# Patient Record
Sex: Female | Born: 1940
Health system: Southern US, Community
[De-identification: ages and names within clinical notes are randomized; demographics above are authoritative.]

## PROBLEM LIST (undated history)

## (undated) ENCOUNTER — Emergency Department (HOSPITAL_COMMUNITY): Admission: EM | Payer: Medicare Other | Source: Home / Self Care

## (undated) DIAGNOSIS — F32A Depression, unspecified: Secondary | ICD-10-CM

## (undated) DIAGNOSIS — E119 Type 2 diabetes mellitus without complications: Secondary | ICD-10-CM

## (undated) DIAGNOSIS — F509 Eating disorder, unspecified: Secondary | ICD-10-CM

## (undated) DIAGNOSIS — I10 Essential (primary) hypertension: Secondary | ICD-10-CM

## (undated) DIAGNOSIS — G43909 Migraine, unspecified, not intractable, without status migrainosus: Secondary | ICD-10-CM

## (undated) DIAGNOSIS — F329 Major depressive disorder, single episode, unspecified: Secondary | ICD-10-CM

## (undated) DIAGNOSIS — M1990 Unspecified osteoarthritis, unspecified site: Secondary | ICD-10-CM

## (undated) DIAGNOSIS — E785 Hyperlipidemia, unspecified: Secondary | ICD-10-CM

## (undated) HISTORY — PX: BREAST BIOPSY: SHX20

## (undated) HISTORY — PX: BREAST LUMPECTOMY: SHX2

## (undated) HISTORY — DX: Essential (primary) hypertension: I10

## (undated) HISTORY — DX: Migraine, unspecified, not intractable, without status migrainosus: G43.909

## (undated) HISTORY — DX: Eating disorder, unspecified: F50.9

## (undated) HISTORY — DX: Hyperlipidemia, unspecified: E78.5

## (undated) HISTORY — DX: Unspecified osteoarthritis, unspecified site: M19.90

## (undated) HISTORY — DX: Type 2 diabetes mellitus without complications: E11.9

## (undated) HISTORY — DX: Depression, unspecified: F32.A

---
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
## 2008-07-15 ENCOUNTER — Emergency Department (HOSPITAL_COMMUNITY): Admission: EM | Admit: 2008-07-15 | Discharge: 2008-07-15 | Payer: Self-pay | Admitting: Emergency Medicine

## 2008-09-13 ENCOUNTER — Emergency Department (HOSPITAL_COMMUNITY): Admission: EM | Admit: 2008-09-13 | Discharge: 2008-09-13 | Payer: Self-pay | Admitting: Emergency Medicine

## 2008-09-14 ENCOUNTER — Emergency Department (HOSPITAL_COMMUNITY): Admission: EM | Admit: 2008-09-14 | Discharge: 2008-09-14 | Payer: Self-pay | Admitting: Emergency Medicine

## 2010-08-02 LAB — GLUCOSE, CAPILLARY: Glucose-Capillary: 111 mg/dL — ABNORMAL HIGH (ref 70–99)

## 2018-06-15 LAB — HM MAMMOGRAPHY

## 2019-01-03 ENCOUNTER — Ambulatory Visit: Payer: Self-pay | Admitting: Internal Medicine

## 2019-02-27 ENCOUNTER — Encounter: Payer: Self-pay | Admitting: Family Medicine

## 2019-02-27 ENCOUNTER — Ambulatory Visit (INDEPENDENT_AMBULATORY_CARE_PROVIDER_SITE_OTHER): Payer: Medicare Other | Admitting: Family Medicine

## 2019-02-27 ENCOUNTER — Other Ambulatory Visit: Payer: Self-pay

## 2019-02-27 VITALS — BP 180/86 | HR 98 | Temp 96.4°F | Ht 62.0 in | Wt 110.0 lb

## 2019-02-27 DIAGNOSIS — F0391 Unspecified dementia with behavioral disturbance: Secondary | ICD-10-CM

## 2019-02-27 DIAGNOSIS — M1990 Unspecified osteoarthritis, unspecified site: Secondary | ICD-10-CM | POA: Diagnosis not present

## 2019-02-27 DIAGNOSIS — I10 Essential (primary) hypertension: Secondary | ICD-10-CM

## 2019-02-27 DIAGNOSIS — Z853 Personal history of malignant neoplasm of breast: Secondary | ICD-10-CM

## 2019-02-27 DIAGNOSIS — Z7689 Persons encountering health services in other specified circumstances: Secondary | ICD-10-CM | POA: Diagnosis not present

## 2019-02-27 DIAGNOSIS — B20 Human immunodeficiency virus [HIV] disease: Secondary | ICD-10-CM

## 2019-02-27 NOTE — Patient Instructions (Addendum)
You should get a blood pressure cuff to monitor your blood pressure at home. We increased your Metoprolol to 50 mg daily.  Please keep a record of your blood pressures.  We will have you follow up in clinic in 2-3 wks.   Arthritis Arthritis is a term that is commonly used to refer to joint pain or joint disease. There are more than 100 types of arthritis. What are the causes? The most common cause of this condition is wear and tear of a joint. Other causes include:  Gout.  Inflammation of a joint.  An infection of a joint.  Sprains and other injuries near the joint.  A reaction to medicines or drugs, or an allergic reaction. In some cases, the cause may not be known. What are the signs or symptoms? The main symptom of this condition is pain in the joint during movement. Other symptoms include:  Redness, swelling, or stiffness at a joint.  Warmth coming from the joint.  Fever.  Overall feeling of illness. How is this diagnosed? This condition may be diagnosed with a physical exam and tests, including:  Blood tests.  Urine tests.  Imaging tests, such as X-rays, an MRI, or a CT scan. Sometimes, fluid is removed from a joint for testing. How is this treated? This condition may be treated with:  Treatment of the cause, if it is known.  Rest.  Raising (elevating) the joint.  Applying cold or hot packs to the joint.  Medicines to improve symptoms and reduce inflammation.  Injections of a steroid such as cortisone into the joint to help reduce pain and inflammation. Depending on the cause of your arthritis, you may need to make lifestyle changes to reduce stress on your joint. Changes may include:  Exercising more.  Losing weight. Follow these instructions at home: Medicines  Take over-the-counter and prescription medicines only as told by your health care provider.  Do not take aspirin to relieve pain if your health care provider thinks that gout may be causing  your pain. Activity  Rest your joint if told by your health care provider. Rest is important when your disease is active and your joint feels painful, swollen, or stiff.  Avoid activities that make the pain worse. It is important to balance activity with rest.  Exercise your joint regularly with range-of-motion exercises as told by your health care provider. Try doing low-impact exercise, such as: ? Swimming. ? Water aerobics. ? Biking. ? Walking. Managing pain, stiffness, and swelling      If directed, put ice on the joint. ? Put ice in a plastic bag. ? Place a towel between your skin and the bag. ? Leave the ice on for 20 minutes, 2-3 times per day.  If your joint is swollen, raise (elevate) it above the level of your heart if directed by your health care provider.  If your joint feels stiff in the morning, try taking a warm shower.  If directed, apply heat to the affected area as often as told by your health care provider. Use the heat source that your health care provider recommends, such as a moist heat pack or a heating pad. If you have diabetes, do not apply heat without permission from your health care provider. To apply heat: ? Place a towel between your skin and the heat source. ? Leave the heat on for 20-30 minutes. ? Remove the heat if your skin turns bright red. This is especially important if you are unable to feel  pain, heat, or cold. You may have a greater risk of getting burned. General instructions  Do not use any products that contain nicotine or tobacco, such as cigarettes, e-cigarettes, and chewing tobacco. If you need help quitting, ask your health care provider.  Keep all follow-up visits as told by your health care provider. This is important. Contact a health care provider if:  The pain gets worse.  You have a fever. Get help right away if:  You develop severe joint pain, swelling, or redness.  Many joints become painful and swollen.  You develop  severe back pain.  You develop severe weakness in your leg.  You cannot control your bladder or bowels. Summary  Arthritis is a term that is commonly used to refer to joint pain or joint disease. There are more than 100 types of arthritis.  The most common cause of this condition is wear and tear of a joint. Other causes include gout, inflammation or infection of the joint, sprains, or allergies.  Symptoms of this condition include redness, swelling, or stiffness of the joint. Other symptoms include warmth, fever, or feeling ill.  This condition is treated with rest, elevation, medicines, and applying cold or hot packs.  Follow your health care provider's instructions about medicines, activity, exercises, and other home care treatments. This information is not intended to replace advice given to you by your health care provider. Make sure you discuss any questions you have with your health care provider. Document Released: 05/18/2004 Document Revised: 03/18/2018 Document Reviewed: 03/18/2018 Elsevier Patient Education  2020 [employer].  Managing Your Hypertension Hypertension is commonly called high blood pressure. This is when the force of your blood pressing against the walls of your arteries is too strong. Arteries are blood vessels that carry blood from your heart throughout your body. Hypertension forces the heart to work harder to pump blood, and may cause the arteries to become narrow or stiff. Having untreated or uncontrolled hypertension can cause heart attack, stroke, kidney disease, and other problems. What are blood pressure readings? A blood pressure reading consists of a higher number over a lower number. Ideally, your blood pressure should be below 120/80. The first ("top") number is called the systolic pressure. It is a measure of the pressure in your arteries as your heart beats. The second ("bottom") number is called the diastolic pressure. It is a measure of the pressure  in your arteries as the heart relaxes. What does my blood pressure reading mean? Blood pressure is classified into four stages. Based on your blood pressure reading, your health care provider may use the following stages to determine what type of treatment you need, if any. Systolic pressure and diastolic pressure are measured in a unit called mm Hg. Normal  Systolic pressure: below 469.  Diastolic pressure: below 80. Elevated  Systolic pressure: 629-528.  Diastolic pressure: below 80. Hypertension stage 1  Systolic pressure: 413-244.  Diastolic pressure: 01-02. Hypertension stage 2  Systolic pressure: 725 or above.  Diastolic pressure: 90 or above. What health risks are associated with hypertension? Managing your hypertension is an important responsibility. Uncontrolled hypertension can lead to:  A heart attack.  A stroke.  A weakened blood vessel (aneurysm).  Heart failure.  Kidney damage.  Eye damage.  Metabolic syndrome.  Memory and concentration problems. What changes can I make to manage my hypertension? Hypertension can be managed by making lifestyle changes and possibly by taking medicines. Your health care provider will help you make a plan to  bring your blood pressure within a normal range. Eating and drinking   Eat a diet that is high in fiber and potassium, and low in salt (sodium), added sugar, and fat. An example eating plan is called the DASH (Dietary Approaches to Stop Hypertension) diet. To eat this way: ? Eat plenty of fresh fruits and vegetables. Try to fill half of your plate at each meal with fruits and vegetables. ? Eat whole grains, such as whole wheat pasta, brown rice, or whole grain bread. Fill about one quarter of your plate with whole grains. ? Eat low-fat diary products. ? Avoid fatty cuts of meat, processed or cured meats, and poultry with skin. Fill about one quarter of your plate with lean proteins such as fish, chicken without skin,  beans, eggs, and tofu. ? Avoid premade and processed foods. These tend to be higher in sodium, added sugar, and fat.  Reduce your daily sodium intake. Most people with hypertension should eat less than 1,500 mg of sodium a day.  Limit alcohol intake to no more than 1 drink a day for nonpregnant women and 2 drinks a day for men. One drink equals 12 oz of beer, 5 oz of wine, or 1 oz of hard liquor. Lifestyle  Work with your health care provider to maintain a healthy body weight, or to lose weight. Ask what an ideal weight is for you.  Get at least 30 minutes of exercise that causes your heart to beat faster (aerobic exercise) most days of the week. Activities may include walking, swimming, or biking.  Include exercise to strengthen your muscles (resistance exercise), such as weight lifting, as part of your weekly exercise routine. Try to do these types of exercises for 30 minutes at least 3 days a week.  Do not use any products that contain nicotine or tobacco, such as cigarettes and e-cigarettes. If you need help quitting, ask your health care provider.  Control any long-term (chronic) conditions you have, such as high cholesterol or diabetes. Monitoring  Monitor your blood pressure at home as told by your health care provider. Your personal target blood pressure may vary depending on your medical conditions, your age, and other factors.  Have your blood pressure checked regularly, as often as told by your health care provider. Working with your health care provider  Review all the medicines you take with your health care provider because there may be side effects or interactions.  Talk with your health care provider about your diet, exercise habits, and other lifestyle factors that may be contributing to hypertension.  Visit your health care provider regularly. Your health care provider can help you create and adjust your plan for managing hypertension. Will I need medicine to control  my blood pressure? Your health care provider may prescribe medicine if lifestyle changes are not enough to get your blood pressure under control, and if:  Your systolic blood pressure is 130 or higher.  Your diastolic blood pressure is 80 or higher. Take medicines only as told by your health care provider. Follow the directions carefully. Blood pressure medicines must be taken as prescribed. The medicine does not work as well when you skip doses. Skipping doses also puts you at risk for problems. Contact a health care provider if:  You think you are having a reaction to medicines you have taken.  You have repeated (recurrent) headaches.  You feel dizzy.  You have swelling in your ankles.  You have trouble with your vision. Get help right  away if:  You develop a severe headache or confusion.  You have unusual weakness or numbness, or you feel faint.  You have severe pain in your chest or abdomen.  You vomit repeatedly.  You have trouble breathing. Summary  Hypertension is when the force of blood pumping through your arteries is too strong. If this condition is not controlled, it may put you at risk for serious complications.  Your personal target blood pressure may vary depending on your medical conditions, your age, and other factors. For most people, a normal blood pressure is less than 120/80.  Hypertension is managed by lifestyle changes, medicines, or both. Lifestyle changes include weight loss, eating a healthy, low-sodium diet, exercising more, and limiting alcohol. This information is not intended to replace advice given to you by your health care provider. Make sure you discuss any questions you have with your health care provider. Document Released: 01/03/2012 Document Revised: 08/02/2018 Document Reviewed: 03/08/2016 Elsevier Patient Education  2020 [employer].

## 2019-03-01 ENCOUNTER — Telehealth: Payer: Self-pay

## 2019-03-01 DIAGNOSIS — I10 Essential (primary) hypertension: Secondary | ICD-10-CM | POA: Insufficient documentation

## 2019-03-01 DIAGNOSIS — F0391 Unspecified dementia with behavioral disturbance: Secondary | ICD-10-CM | POA: Insufficient documentation

## 2019-03-01 DIAGNOSIS — F03918 Unspecified dementia, unspecified severity, with other behavioral disturbance: Secondary | ICD-10-CM | POA: Insufficient documentation

## 2019-03-01 DIAGNOSIS — Z853 Personal history of malignant neoplasm of breast: Secondary | ICD-10-CM | POA: Insufficient documentation

## 2019-03-01 DIAGNOSIS — M1990 Unspecified osteoarthritis, unspecified site: Secondary | ICD-10-CM | POA: Insufficient documentation

## 2019-03-01 NOTE — Progress Notes (Signed)
Patient presents to clinic today to f/u on chroinic conditions and establish care.  Pt accompanied by her daughter [name] and a caregiver.  SUBJECTIVE: PMH: Pt is a 78 yo female with pmh sig for HTN, h/o breast cancer 2002 with reocurrence in 2010, HIV on HAART, arthritis, anxiety, dementia, HLD, DM II.  Pt previously seen in [place].  She moved to the area to live with her daughter after a recent hospitalization for hallucinations.   HTN: -taking Toprol XL 25 mg daily  H/o breast cancer: -R breast, intracystic papillary carcinoma with a component of DCIS dx'd in 2002 per chart review. -ER+ -s/p lumpectomy,  -R axillary recurrence in 2010.  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 -Quetiapine 25 mg BID for agitation.  Pt has not been taking. -pt's daughter questions if pt needs this medication  HIV: -Dx'd after a blood transfusion 20 yrs ago.  Pt's daughter states pt did not tell the family until yrs later. -on Biktarvy 50-200-25 mg daily and Bactrim DS daily -need refills on Biktarvy. Has 4 pills left. -on med assistance program but med has not been sent.  Pt's daughter calls program during [facility]- missing a form pt's son was supposed to complete? -Pt's caregiver insisting pt be given samples until med sent.  Suggest if pt is seen at  clinic they would have resources to give samples.  Arthritis: -pain in knees.  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   Past Medical History:  Diagnosis Date  . Arthritis   . Depression   . Diabetes mellitus without complication ([place])   . Eating disorder   . Hyperlipidemia   . Hypertension   . Migraines      Current Outpatient Medications on File Prior to Visit  Medication Sig Dispense Refill   . anastrozole (ARIMIDEX) 1 MG tablet Take 1 mg by mouth daily.    . bictegravir-emtricitabine-tenofovir AF (BIKTARVY) 50-200-25 MG TABS tablet Take 25 tablets by mouth daily.    [name] Kitchen MELATONIN PO Take 6 mg by mouth at bedtime.    [name] Kitchen METOPROLOL SUCCINATE ER PO Take 25 mg by mouth daily.    . QUEtiapine (SEROQUEL) 25 MG tablet Take 25 mg by mouth 2 (two) times daily.    [name] Kitchen sulfamethoxazole-trimethoprim (BACTRIM DS) 800-160 MG tablet Take 1 tablet by mouth daily.     No current facility-administered medications on file prior to visit.     Not on File  No family history on file.  Social History   Socioeconomic History  . Marital status: Single    Spouse name: Not on file  . Number of children: Not on file  . Years of education: Not on file  . Highest education level: Not on file  Occupational History  . Not on file  Social Needs  . Financial resource strain: Not on file  . Food insecurity    Worry: Not on file    Inability: Not on file  . Transportation needs    Medical: Not on file    Non-medical: Not on file  Tobacco Use  . Smoking status: Never Smoker  . Smokeless tobacco: Never Used  Substance and Sexual Activity  . Alcohol use: Never    Frequency: Never  .  Drug use: Never  . Sexual activity: Not on file  Lifestyle  . Physical activity    Days per week: Not on file    Minutes per session: Not on file  . Stress: Not on file  Relationships  . Social Herbalist on phone: Not on file    Gets together: Not on file    Attends religious service: Not on file    Active member of club or organization: Not on file    Attends meetings of clubs or organizations: Not on file    Relationship status: Not on file  . Intimate partner violence    Fear of current or ex partner: Not on file    Emotionally abused: Not on file    Physically abused: Not on file    Forced sexual activity: Not on file  Other Topics Concern  . Not on file  Social History Narrative  . Not on  file    ROS General: Denies fever, chills, night sweats, changes in weight, changes in appetite  +h/o dementia HEENT: Denies headaches, ear pain, changes in vision, rhinorrhea, sore throat CV: Denies CP, palpitations, SOB, orthopnea Pulm: Denies SOB, cough, wheezing GI: Denies abdominal pain, nausea, vomiting, diarrhea, constipation GU: Denies dysuria, hematuria, frequency, vaginal discharge Msk: Denies muscle cramps, joint pains Neuro: Denies weakness, numbness, tingling Skin: Denies rashes, bruising Psych: Denies depression, anxiety, hallucinations   BP (!) 180/86 (BP Location: Left Arm, Patient Position: Sitting, Cuff Size: Normal)   Pulse 98   Temp (!) 96.4 F (35.8 C) (Temporal)   Ht 5\' 2"  (1.575 m)   Wt 110 lb (49.9 kg)   SpO2 98%   BMI 20.12 kg/m   Physical Exam Gen. Pleasant, well developed, well-nourished, in NAD HEENT - [place]/AT, PERRL, no scleral icterus, no nasal drainage Lungs: no use of accessory muscles, CTAB, no wheezes, rales or rhonchi Cardiovascular: RRR, No r/g/m, no peripheral edema Musculoskeletal: No deformities, moves all four extremities, no cyanosis or clubbing, normal tone Neuro:  Pleasantly demented, alert to person, CN II-XII intact, normal gait Skin:  Warm, dry, intact, no lesions  No results found for this or any previous visit (from the past 2160 hour(s)).  Assessment/Plan: Essential hypertension -uncontrolled -discussed lifestyle modifications -continue metoprolol succinate ER 25 mg -advised to check bp daily at home.  Will likely need to increase dose. -close f/u advised   Arthritis -stable  History of breast cancer -in remission -continue anastrazole 1 mg daily  Encounter to establish care -We reviewed the PMH, PSH, FH, SH, Meds and Allergies. -We provided refills for any medications we will prescribe as needed. -We addressed current concerns per orders and patient instructions. -We have asked for records for pertinent exams,  studies, vaccines and notes from previous providers. -We have advised patient to follow up per instructions below.  Dementia with behavioral disturbance, unspecified dementia type ([place]) -not on medication -may need to restart Quetiapine 25 mg BID for agitation. -advised this provider does not prescribe benzos.  HIV disease ([place])  -no recent CD4 count available. -continue Biktarvy and Bactrim DS daily. -pt's daughter advised to contact the medication assistance program in regards to [place] refills. -this provider contacted several pharmacies including the hospital,  outpt, and ID for options to obtain Biktarvy.  All state pt is limited 2/2 medication cost. -discussed need to obtain previous records. - Plan: Ambulatory referral to Infectious Disease  F/u in the next few wks.  More than 50% of over 50 minutes  in total spent face-to-face with the patient, counseling and/or coordinating care.    Grier Mitts, MD

## 2019-03-01 NOTE — Telephone Encounter (Signed)
Pt Enrollment Form was completed and faxed to [facility] confirmation receied

## 2019-03-03 ENCOUNTER — Encounter: Payer: Self-pay | Admitting: Family Medicine

## 2019-03-03 ENCOUNTER — Telehealth: Payer: Self-pay | Admitting: Family Medicine

## 2019-03-03 DIAGNOSIS — B20 Human immunodeficiency virus [HIV] disease: Secondary | ICD-10-CM | POA: Insufficient documentation

## 2019-03-03 NOTE — Telephone Encounter (Signed)
Amber Lopez from [facility] called requesting forms sent back regarding medication bictegravir-emtricitabine-tenofovir AF (BIKTARVY) 50-200-25 MG TABS tablet Amber Lopez states onPage one is missing product/medication name, and also is missing page 2.  FAX [phone] Call back [phone]  Ask for Amber Lopez

## 2019-03-03 NOTE — Telephone Encounter (Unsigned)
Copied from [place] [id number]. Topic: General - Other >> Mar 03, 2019  3:04 PM Carolyn Stare wrote:  Pt daughter said Amber Lopez [facility] is waiting on paper work to be faxed back to them that need to be signed, once they received sign paperwork back she will be able to get her medicine   page 2 is what they waiting on it >> Mar 03, 2019  4:05 PM Leward Quan A wrote: Patient daughter [name] called back to say that [facility] is needing the paper work that was recently faxed back to be faxed again. States that mom only have one of her pill for HIV left to take today and they wanted to get the medication out to her today but page 2 is not clear. Please re fax and contact [name] to inform Ph#   [phone]

## 2019-03-03 NOTE — Telephone Encounter (Unsigned)
Copied from [place] [id number]. Topic: General - Other >> Mar 03, 2019  3:04 PM Carolyn Stare wrote:  Pt daughter said [name] is waiting on paper work to be faxed back to them that need to be signed, once they received sign paperwork back she will be able to get her medicine   page 2 is what they waiting on it

## 2019-03-04 ENCOUNTER — Telehealth: Payer: Self-pay | Admitting: Family Medicine

## 2019-03-04 NOTE — Telephone Encounter (Signed)
Spoke with [employer] and they state that they did not receive the [place] page, form was refax and confirmation was received, pt daughter is aware that forms were faxed back to [place].

## 2019-03-04 NOTE — Telephone Encounter (Signed)
Patient's daughter calling and states that the pharmacy is needing a prescription for bictegravir-emtricitabine-tenofovir AF (BIKTARVY) 50-200-25 MG TABS tablet  States that she got approved to get this medication, but a prescription was never sent. States that patient is out of this medication and has not had it in 2 days. Please advise.

## 2019-03-04 NOTE — Telephone Encounter (Signed)
Spoke with pt daughter aware that the form was refax and a confirmation received

## 2019-03-04 NOTE — Telephone Encounter (Signed)
  Read the previous note on 03/04/2019. This encounter has been resolved   Copied from [place] [phone]. Topic: General - Other >> Mar 03, 2019  3:04 PM Carolyn Stare wrote:  Pt daughter said [name] is waiting on paper work to be faxed back to them that need to be signed, once they received sign paperwork back she will be able to get her medicine   page 2 is what they waiting on it >> Mar 04, 2019 11:30 AM Percell Belt A wrote: Please call [phone] [name] with [name] when the fax has been sent.  >> Mar 03, 2019  4:05 PM Leward Quan A wrote: Patient daughter Amber Lopez called back to say that [place] is needing the paper work that was recently faxed back to be faxed again. States that mom only have one of her pill for HIV left to take today and they wanted to get the medication out to her today but page 2 is not clear. Please re fax and contact Amber Lopez to inform Ph#   [phone]

## 2019-03-04 NOTE — Telephone Encounter (Signed)
Daughter is calling back to make sure that the form is signed and faxed before 5pm today so that the patient can get her medication.  CB# [phone]

## 2019-03-04 NOTE — Telephone Encounter (Unsigned)
Copied from [place] [phone]. Topic: General - Other >> Mar 03, 2019  3:04 PM Carolyn Stare wrote:  Pt daughter said [facility] is waiting on paper work to be faxed back to them that need to be signed, once they received sign paperwork back she will be able to get her medicine   page 2 is what they waiting on it >> Mar 04, 2019 11:30 AM Percell Belt A wrote: Please call [phone] Amber Lopez with [name] when the fax has been sent.  >> Mar 03, 2019  4:05 PM Leward Quan A wrote: Patient daughter Amber Lopez called back to say that [place] is needing the paper work that was recently faxed back to be faxed again. States that mom only have one of her pill for HIV left to take today and they wanted to get the medication out to her today but page 2 is not clear. Please re fax and contact Amber Lopez to inform Ph#   [phone]

## 2019-03-05 ENCOUNTER — Other Ambulatory Visit: Payer: Self-pay

## 2019-03-05 MED ORDER — BICTEGRAVIR-EMTRICITAB-TENOFOV 50-200-25 MG PO TABS: 1.0000 | ORAL_TABLET | Freq: Every day | ORAL | 3 refills | Status: DC

## 2019-03-05 NOTE — Telephone Encounter (Signed)
Rx sent to pt pharmacy 

## 2019-03-12 ENCOUNTER — Ambulatory Visit: Payer: Medicare Other

## 2019-03-12 ENCOUNTER — Other Ambulatory Visit: Payer: Medicare Other

## 2019-03-12 LAB — HM DIABETES EYE EXAM

## 2019-03-13 ENCOUNTER — Other Ambulatory Visit (HOSPITAL_COMMUNITY)
Admission: RE | Admit: 2019-03-13 | Discharge: 2019-03-13 | Disposition: A | Discharge status: Home / Self Care | Payer: Medicare Other | Source: Ambulatory Visit | Attending: Family | Admitting: Family

## 2019-03-13 ENCOUNTER — Other Ambulatory Visit: Payer: Self-pay | Admitting: *Deleted

## 2019-03-13 ENCOUNTER — Ambulatory Visit: Payer: Medicare Other

## 2019-03-13 ENCOUNTER — Other Ambulatory Visit: Payer: Self-pay

## 2019-03-13 ENCOUNTER — Other Ambulatory Visit: Payer: Medicare Other

## 2019-03-13 DIAGNOSIS — B20 Human immunodeficiency virus [HIV] disease: Secondary | ICD-10-CM

## 2019-03-13 DIAGNOSIS — Z79899 Other long term (current) drug therapy: Secondary | ICD-10-CM

## 2019-03-13 DIAGNOSIS — Z113 Encounter for screening for infections with a predominantly sexual mode of transmission: Secondary | ICD-10-CM

## 2019-03-14 LAB — URINALYSIS
Bilirubin Urine: NEGATIVE
Glucose, UA: NEGATIVE
Hgb urine dipstick: NEGATIVE
Ketones, ur: NEGATIVE
Leukocytes,Ua: NEGATIVE
Nitrite: NEGATIVE
Protein, ur: NEGATIVE
Specific Gravity, Urine: 1.02 (ref 1.001–1.03)
pH: 6.5 (ref 5.0–8.0)

## 2019-03-14 LAB — URINE CYTOLOGY ANCILLARY ONLY
Chlamydia: NEGATIVE
Comment: NEGATIVE
Comment: NORMAL
Neisseria Gonorrhea: NEGATIVE

## 2019-03-14 LAB — T-HELPER CELL (CD4) - (RCID CLINIC ONLY)
CD4 % Helper T Cell: 14 % — ABNORMAL LOW (ref 33–65)
CD4 T Cell Abs: 154 /uL — ABNORMAL LOW (ref 400–1790)

## 2019-03-17 ENCOUNTER — Encounter: Payer: Self-pay | Admitting: Family Medicine

## 2019-03-20 LAB — HIV-1 RNA ULTRAQUANT REFLEX TO GENTYP+
HIV 1 RNA Quant: 20 copies/mL — ABNORMAL HIGH
HIV-1 RNA Quant, Log: 1.3 Log copies/mL — ABNORMAL HIGH

## 2019-03-20 LAB — CBC WITH DIFFERENTIAL/PLATELET
Absolute Monocytes: 369 cells/uL (ref 200–950)
Basophils Absolute: 18 cells/uL (ref 0–200)
Basophils Relative: 0.4 %
Eosinophils Absolute: 50 cells/uL (ref 15–500)
Eosinophils Relative: 1.1 %
HCT: 35.1 % (ref 35.0–45.0)
Hemoglobin: 11.5 g/dL — ABNORMAL LOW (ref 11.7–15.5)
Lymphs Abs: 1089 cells/uL (ref 850–3900)
MCH: 30.8 pg (ref 27.0–33.0)
MCHC: 32.8 g/dL (ref 32.0–36.0)
MCV: 94.1 fL (ref 80.0–100.0)
MPV: 10.8 fL (ref 7.5–12.5)
Monocytes Relative: 8.2 %
Neutro Abs: 2975 cells/uL (ref 1500–7800)
Neutrophils Relative %: 66.1 %
Platelets: 242 10*3/uL (ref 140–400)
RBC: 3.73 10*6/uL — ABNORMAL LOW (ref 3.80–5.10)
RDW: 12.5 % (ref 11.0–15.0)
Total Lymphocyte: 24.2 %
WBC: 4.5 10*3/uL (ref 3.8–10.8)

## 2019-03-20 LAB — LIPID PANEL
Cholesterol: 282 mg/dL — ABNORMAL HIGH (ref <200)
HDL: 73 mg/dL (ref >=50)
LDL Cholesterol (Calc): 183 mg/dL (calc) — ABNORMAL HIGH
Non-HDL Cholesterol (Calc): 209 mg/dL (calc) — ABNORMAL HIGH (ref <130)
Total CHOL/HDL Ratio: 3.9 (calc) (ref <5.0)
Triglycerides: 128 mg/dL (ref <150)

## 2019-03-20 LAB — QUANTIFERON-TB GOLD PLUS
Mitogen-NIL: 9.43 IU/mL
NIL: 0.03 IU/mL
QuantiFERON-TB Gold Plus: NEGATIVE
TB1-NIL: 0.01 IU/mL
TB2-NIL: 0.01 IU/mL

## 2019-03-20 LAB — COMPLETE METABOLIC PANEL WITH GFR
AG Ratio: 1.2 (calc) (ref 1.0–2.5)
ALT: 9 U/L (ref 6–29)
AST: 16 U/L (ref 10–35)
Albumin: 4.1 g/dL (ref 3.6–5.1)
Alkaline phosphatase (APISO): 49 U/L (ref 37–153)
BUN/Creatinine Ratio: 22 (calc) (ref 6–22)
BUN: 32 mg/dL — ABNORMAL HIGH (ref 7–25)
CO2: 29 mmol/L (ref 20–32)
Calcium: 9.7 mg/dL (ref 8.6–10.4)
Chloride: 101 mmol/L (ref 98–110)
Creat: 1.45 mg/dL — ABNORMAL HIGH (ref 0.60–0.93)
GFR, Est African American: 40 mL/min/{1.73_m2} — ABNORMAL LOW (ref >=60)
GFR, Est Non African American: 34 mL/min/{1.73_m2} — ABNORMAL LOW (ref >=60)
Globulin: 3.5 g/dL (calc) (ref 1.9–3.7)
Glucose, Bld: 114 mg/dL — ABNORMAL HIGH (ref 65–99)
Potassium: 4.4 mmol/L (ref 3.5–5.3)
Sodium: 138 mmol/L (ref 135–146)
Total Bilirubin: 0.3 mg/dL (ref 0.2–1.2)
Total Protein: 7.6 g/dL (ref 6.1–8.1)

## 2019-03-20 LAB — HEPATITIS B CORE ANTIBODY, TOTAL: Hep B Core Total Ab: NONREACTIVE

## 2019-03-20 LAB — HEPATITIS B SURFACE ANTIGEN: Hepatitis B Surface Ag: NONREACTIVE

## 2019-03-20 LAB — HEPATITIS B SURFACE ANTIBODY,QUALITATIVE: Hep B S Ab: REACTIVE — AB

## 2019-03-20 LAB — HIV-1/2 AB - DIFFERENTIATION
HIV-1 antibody: POSITIVE — AB
HIV-2 Ab: NEGATIVE

## 2019-03-20 LAB — HIV ANTIBODY (ROUTINE TESTING W REFLEX): HIV 1&2 Ab, 4th Generation: REACTIVE — AB

## 2019-03-20 LAB — RPR: RPR Ser Ql: NONREACTIVE

## 2019-03-20 LAB — HEPATITIS C ANTIBODY
Hepatitis C Ab: NONREACTIVE
SIGNAL TO CUT-OFF: 0.05 (ref <1.00)

## 2019-03-20 LAB — HEPATITIS A ANTIBODY, TOTAL: Hepatitis A AB,Total: REACTIVE — AB

## 2019-03-20 LAB — HLA B*5701: HLA-B*5701 w/rflx HLA-B High: NEGATIVE

## 2019-03-24 ENCOUNTER — Other Ambulatory Visit: Payer: Self-pay | Admitting: Family Medicine

## 2019-03-24 DIAGNOSIS — Z1231 Encounter for screening mammogram for malignant neoplasm of breast: Secondary | ICD-10-CM

## 2019-03-26 ENCOUNTER — Encounter: Payer: Self-pay | Admitting: Family

## 2019-03-27 ENCOUNTER — Encounter: Payer: Medicare Other | Admitting: Family

## 2019-03-28 ENCOUNTER — Ambulatory Visit: Payer: Medicare Other | Admitting: Family

## 2019-03-31 ENCOUNTER — Ambulatory Visit: Payer: Medicare Other

## 2019-04-11 ENCOUNTER — Other Ambulatory Visit: Payer: Self-pay | Admitting: Family Medicine

## 2019-04-11 ENCOUNTER — Ambulatory Visit
Admission: RE | Admit: 2019-04-11 | Discharge: 2019-04-11 | Disposition: A | Discharge status: Home / Self Care | Payer: Medicare Other | Source: Ambulatory Visit | Attending: Family Medicine | Admitting: Family Medicine

## 2019-04-11 ENCOUNTER — Other Ambulatory Visit: Payer: Self-pay

## 2019-04-11 DIAGNOSIS — Z1231 Encounter for screening mammogram for malignant neoplasm of breast: Secondary | ICD-10-CM

## 2019-04-11 DIAGNOSIS — N6310 Unspecified lump in the right breast, unspecified quadrant: Secondary | ICD-10-CM

## 2019-04-16 ENCOUNTER — Telehealth: Payer: Self-pay | Admitting: Family Medicine

## 2019-04-16 NOTE — Telephone Encounter (Signed)
Please advise 

## 2019-04-16 NOTE — Telephone Encounter (Signed)
NP [place] called in stating she saw pt yesterday and performed a quantiflow test to check for arterial disease. States hey lower extremities showed an abnormal reading. The left lower extremity is worse than right and is showing asymptomatic like symptoms of this. NP wanted to call in and notify team and PCP to see if office would like to follow up or do additional tests. Please advise. Call back is [phone].

## 2019-04-16 NOTE — Telephone Encounter (Signed)
Ok, but confused by note "asymptomatic like symptoms"?

## 2019-04-16 NOTE — Telephone Encounter (Signed)
Message Routed to PCP CMA 

## 2019-04-21 ENCOUNTER — Other Ambulatory Visit: Payer: Self-pay

## 2019-04-21 ENCOUNTER — Ambulatory Visit (INDEPENDENT_AMBULATORY_CARE_PROVIDER_SITE_OTHER): Payer: Medicare Other | Admitting: Family

## 2019-04-21 ENCOUNTER — Encounter: Payer: Self-pay | Admitting: Family

## 2019-04-21 VITALS — BP 184/76 | HR 58 | Temp 97.2°F | Wt 122.0 lb

## 2019-04-21 DIAGNOSIS — I10 Essential (primary) hypertension: Secondary | ICD-10-CM | POA: Diagnosis not present

## 2019-04-21 DIAGNOSIS — B20 Human immunodeficiency virus [HIV] disease: Secondary | ICD-10-CM

## 2019-04-21 DIAGNOSIS — F0391 Unspecified dementia with behavioral disturbance: Secondary | ICD-10-CM

## 2019-04-21 MED ORDER — BICTEGRAVIR-EMTRICITAB-TENOFOV 50-200-25 MG PO TABS: 1.0000 | ORAL_TABLET | Freq: Every day | ORAL | 3 refills | Status: DC

## 2019-04-21 MED ORDER — SULFAMETHOXAZOLE-TRIMETHOPRIM 800-160 MG PO TABS: 1.0000 | ORAL_TABLET | Freq: Every day | ORAL | 2 refills | Status: DC

## 2019-04-21 NOTE — Telephone Encounter (Signed)
FYI

## 2019-04-21 NOTE — Assessment & Plan Note (Signed)
Amber Lopez has well-controlled HIV disease with good adherence and tolerance to her ART regimen of Biktarvy supplemented with Bactrim for OI prophylaxis.  She has no signs of opportunistic infection at present with question of previously poorly controlled HIV disease contributing to her dementia.  We reviewed her lab work and discussed the plan of care.  She remains under constant supervision from her daughter and caregiver.  Continue current dose of Biktarvy supplemented with Bactrim for OI prophylaxis.  If she remains undetectable for the next 3 months we will consider discontinuing Bactrim.  Plan for follow-up in 2 months or sooner if needed with lab work 1 to 2 weeks prior to appointment.

## 2019-04-21 NOTE — Progress Notes (Signed)
Subjective:    Patient ID: Amber Lopez, female    DOB: [birth year], 78 y.o.   MRN: [id number]  Chief Complaint  Patient presents with  . HIV Positive/AIDS    no complaints; reports receiving flu shot;    HPI:  Amber Lopez is a 78 y.o. female with previous medical history significant for dementia, breast cancer s/p chemotherapy and radiation, migraines, hypertension, hyperlipidemia, diabetes without complication and HIV disease presenting today to establish care for HIV disease. Amber Lopez has dementia and her caregiver is present who provides a majority of the history as Amber Lopez is confused.  Per chart review, Amber Lopez was initially diagnosed with HIV disease in 2004. She was initially in care at the [facility] where records from 2011 show ART therapy with Atripla. In July 2017 she was no longer in care with [facility]. In August 2020 she was admitted to [facility] with with altered mental status and diagnosed with progression of dementia complicated with active hallucinations. Found to have less than optimal compliance with her ART from [facility] and was started on Vonore.  Viral load at the time was 383  Amber Lopez completed her initial clinic blood work on 03/13/2019 with a viral load that was undetectable and CD4 count of 154. HLA-[id number] and Quantiferon were negative.  She is serologically immune to hepatitis A and hepatitis B.  No active infection with hepatitis C.  STI testing for gonorrhea, chlamydia, and syphilis were negative.  Creatinine of 1.45 with stable electrolytes and liver function tests.  Lipid profile with LDL level of 183, HDL 73, and triglycerides of 128.  Amber Lopez continues to take her Biktarvy as prescribed supplemented with Bactrim for OI prophylaxis.  Overall she is feeling well today with no new concerns/complaints. Denies fevers, chills, night sweats, headaches, changes in vision, neck pain/stiffness, nausea, diarrhea, vomiting,  lesions or rashes.  Amber Lopez remains covered through [place] healthcare Medicare and has no problems obtaining her medication from the pharmacy.  She is working with Dr. Volanda Napoleon in primary care for her other medical conditions.  Denies feelings of being down, depressed, or hopeless recently.  She currently lives with her daughter and time is spent with supervision between her daughter and caregiver.   No Known Allergies    Outpatient Medications Prior to Visit  Medication Sig Dispense Refill  . anastrozole (ARIMIDEX) 1 MG tablet Take 1 mg by mouth daily.    [name] Kitchen MELATONIN PO Take 6 mg by mouth at bedtime.    [name] Kitchen METOPROLOL SUCCINATE ER PO Take 25 mg by mouth daily.    . QUEtiapine (SEROQUEL) 25 MG tablet Take 25 mg by mouth 2 (two) times daily.    . bictegravir-emtricitabine-tenofovir AF (BIKTARVY) 50-200-25 MG TABS tablet Take 1 tablet by mouth daily. 30 tablet 3  . sulfamethoxazole-trimethoprim (BACTRIM DS) 800-160 MG tablet Take 1 tablet by mouth daily.    [name] Kitchen LORazepam (ATIVAN) 1 MG tablet     . metoprolol succinate (TOPROL-XL) 25 MG 24 hr tablet Take 25 mg by mouth daily.     No facility-administered medications prior to visit.     Past Medical History:  Diagnosis Date  . Arthritis   . Depression   . Diabetes mellitus without complication ([place])   . Eating disorder   . Hyperlipidemia   . Hypertension   . Migraines       Past Surgical History:  Procedure Laterality Date  . BREAST BIOPSY    . BREAST  LUMPECTOMY Right    Years ago / Pt's daughter thinks it was cancer      Family History  Problem Relation Age of Onset  . Breast cancer Sister 21      Social History   Socioeconomic History  . Marital status: Single    Spouse name: Not on file  . Number of children: Not on file  . Years of education: Not on file  . Highest education level: Not on file  Occupational History  . Not on file  Tobacco Use  . Smoking status: Never Smoker  . Smokeless tobacco: Never  Used  Substance and Sexual Activity  . Alcohol use: Never  . Drug use: Never  . Sexual activity: Not on file  Other Topics Concern  . Not on file  Social History Narrative  . Not on file   Social Determinants of Health   Financial Resource Strain:   . Difficulty of Paying Living Expenses: Not on file  Food Insecurity:   . Worried About Charity fundraiser in the Last Year: Not on file  . Ran Out of Food in the Last Year: Not on file  Transportation Needs:   . Lack of Transportation (Medical): Not on file  . Lack of Transportation (Non-Medical): Not on file  Physical Activity:   . Days of Exercise per Week: Not on file  . Minutes of Exercise per Session: Not on file  Stress:   . Feeling of Stress : Not on file  Social Connections:   . Frequency of Communication with Friends and Family: Not on file  . Frequency of Social Gatherings with Friends and Family: Not on file  . Attends Religious Services: Not on file  . Active Member of Clubs or Organizations: Not on file  . Attends Archivist Meetings: Not on file  . Marital Status: Not on file  Intimate Partner Violence:   . Fear of Current or Ex-Partner: Not on file  . Emotionally Abused: Not on file  . Physically Abused: Not on file  . Sexually Abused: Not on file      Review of Systems  Constitutional: Negative for appetite change, chills, diaphoresis, fatigue, fever and unexpected weight change.  Eyes:       Negative for acute change in vision  Respiratory: Negative for chest tightness, shortness of breath and wheezing.   Cardiovascular: Negative for chest pain.  Gastrointestinal: Negative for diarrhea, nausea and vomiting.  Genitourinary: Negative for dysuria, pelvic pain and vaginal discharge.  Musculoskeletal: Negative for neck pain and neck stiffness.  Skin: Negative for rash.  Neurological: Negative for seizures, syncope, weakness and headaches.  Hematological: Negative for adenopathy. Does not  bruise/bleed easily.  Psychiatric/Behavioral: Negative for hallucinations.       Objective:    BP (!) 184/76   Pulse (!) 58   Temp (!) 97.2 F (36.2 C) (Other (Comment))   Wt 122 lb (55.3 kg)   BMI 22.31 kg/m  Nursing note and vital signs reviewed.  Physical Exam Constitutional:      General: She is not in acute distress.    Appearance: She is well-developed.  Cardiovascular:     Rate and Rhythm: Normal rate and regular rhythm.     Heart sounds: Normal heart sounds.  Pulmonary:     Effort: Pulmonary effort is normal.     Breath sounds: Normal breath sounds.  Skin:    General: Skin is warm and dry.  Neurological:     Mental Status:  She is alert. She is disoriented.  Psychiatric:        Speech: Speech normal.        Behavior: Behavior normal.        Cognition and Memory: Cognition is impaired. Memory is impaired.        Judgment: Judgment is impulsive.        Assessment & Plan:   Patient Active Problem List   Diagnosis Date Noted  . HIV disease ([place]) 03/03/2019  . History of breast cancer 03/01/2019  . Dementia with behavioral disturbance ([place]) 03/01/2019  . Arthritis 03/01/2019  . Essential hypertension 03/01/2019     Problem List Items Addressed This Visit      Cardiovascular and Mediastinum   Essential hypertension    Blood pressure elevated above goal 140/90 with current dose of metoprolol.  Encouraged to monitor blood pressure at home and follow-up with PCP if blood pressure remains poorly controlled.      Relevant Medications   metoprolol succinate (TOPROL-XL) 25 MG 24 hr tablet     Nervous and Auditory   Dementia with behavioral disturbance ([place])    Amber Lopez is disoriented during our time today.  She believes I I am a student that she had in gym class previously.  She does not have very good insight into her current actions.  Continue management per primary care.      Relevant Medications   LORazepam (ATIVAN) 1 MG tablet     Other   HIV  disease ([place]) - Primary    Amber Lopez has well-controlled HIV disease with good adherence and tolerance to her ART regimen of Biktarvy supplemented with Bactrim for OI prophylaxis.  She has no signs of opportunistic infection at present with question of previously poorly controlled HIV disease contributing to her dementia.  We reviewed her lab work and discussed the plan of care.  She remains under constant supervision from her daughter and caregiver.  Continue current dose of Biktarvy supplemented with Bactrim for OI prophylaxis.  If she remains undetectable for the next 3 months we will consider discontinuing Bactrim.  Plan for follow-up in 2 months or sooner if needed with lab work 1 to 2 weeks prior to appointment.      Relevant Medications   bictegravir-emtricitabine-tenofovir AF (BIKTARVY) 50-200-25 MG TABS tablet   sulfamethoxazole-trimethoprim (BACTRIM DS) 800-160 MG tablet   Other Relevant Orders   3 month T-helper cell (CD4)- ([facility] clinic only)   3 month VL   3 month CMP       I am having Amber Lopez maintain her METOPROLOL SUCCINATE ER PO, QUEtiapine, anastrozole, MELATONIN PO, LORazepam, metoprolol succinate, bictegravir-emtricitabine-tenofovir AF, and sulfamethoxazole-trimethoprim.   Meds ordered this encounter  Medications  . bictegravir-emtricitabine-tenofovir AF (BIKTARVY) 50-200-25 MG TABS tablet    Sig: Take 1 tablet by mouth daily.    Dispense:  30 tablet    Refill:  3    Order Specific Question:   Supervising Provider    Answer:   Carlyle Basques [[id number]]  . sulfamethoxazole-trimethoprim (BACTRIM DS) 800-160 MG tablet    Sig: Take 1 tablet by mouth daily.    Dispense:  30 tablet    Refill:  2    Order Specific Question:   Supervising Provider    Answer:   Carlyle Basques [[id number]]     Follow-up: Return in about 2 months (around 06/22/2019), or if symptoms worsen or fail to improve.    Terri Piedra, MSN, FNP-C Nurse Practitioner [facility] for  Infectious  Disease [facility] Office phone: [phone] Pager: [place] number: [phone]

## 2019-04-21 NOTE — Patient Instructions (Signed)
Nice to meet you.  Your blood work looks good.   Continue to take your Biktarvy and Bactrim as prescribed daily.  We will plan to follow up in 2 months or sooner if needed with lab work 1-2 weeks prior to your appointment.   Please let us know if you have any questions.  Have a great day and stay safe!

## 2019-04-21 NOTE — Assessment & Plan Note (Signed)
Blood pressure elevated above goal 140/90 with current dose of metoprolol.  Encouraged to monitor blood pressure at home and follow-up with PCP if blood pressure remains poorly controlled.

## 2019-04-21 NOTE — Telephone Encounter (Signed)
  FYI Spoke with Heather with [facility] states that she was just letting Dr Volanda Napoleon about her findings on pt Quantiflow test which show abnormal results of Arterial reading. Nira Conn state that pt is asymptomatic. No orders needed at this time unless Dr Volanda Napoleon advise for further testing.

## 2019-04-21 NOTE — Assessment & Plan Note (Signed)
Amber Lopez is disoriented during our time today.  She believes I I am a student that she had in gym class previously.  She does not have very good insight into her current actions.  Continue management per primary care.

## 2019-05-18 DIAGNOSIS — M6281 Muscle weakness (generalized): Secondary | ICD-10-CM | POA: Diagnosis not present

## 2019-05-18 DIAGNOSIS — E46 Unspecified protein-calorie malnutrition: Secondary | ICD-10-CM | POA: Diagnosis not present

## 2019-05-18 DIAGNOSIS — G9340 Encephalopathy, unspecified: Secondary | ICD-10-CM | POA: Diagnosis not present

## 2019-06-12 ENCOUNTER — Encounter: Payer: Self-pay | Admitting: Family Medicine

## 2019-06-18 DIAGNOSIS — E46 Unspecified protein-calorie malnutrition: Secondary | ICD-10-CM | POA: Diagnosis not present

## 2019-06-18 DIAGNOSIS — G9340 Encephalopathy, unspecified: Secondary | ICD-10-CM | POA: Diagnosis not present

## 2019-06-18 DIAGNOSIS — M6281 Muscle weakness (generalized): Secondary | ICD-10-CM | POA: Diagnosis not present

## 2019-06-23 ENCOUNTER — Other Ambulatory Visit: Payer: Medicare Other

## 2019-07-14 ENCOUNTER — Encounter: Payer: Medicare Other | Admitting: Family

## 2019-07-16 DIAGNOSIS — M6281 Muscle weakness (generalized): Secondary | ICD-10-CM | POA: Diagnosis not present

## 2019-07-16 DIAGNOSIS — E46 Unspecified protein-calorie malnutrition: Secondary | ICD-10-CM | POA: Diagnosis not present

## 2019-07-16 DIAGNOSIS — G9340 Encephalopathy, unspecified: Secondary | ICD-10-CM | POA: Diagnosis not present

## 2019-08-07 ENCOUNTER — Encounter: Payer: Self-pay | Admitting: Family Medicine

## 2019-08-07 ENCOUNTER — Other Ambulatory Visit: Payer: Self-pay

## 2019-08-07 ENCOUNTER — Ambulatory Visit: Payer: Medicare Other | Admitting: Family Medicine

## 2019-08-07 ENCOUNTER — Ambulatory Visit (INDEPENDENT_AMBULATORY_CARE_PROVIDER_SITE_OTHER): Payer: Medicare Other | Admitting: Family Medicine

## 2019-08-07 VITALS — BP 190/84 | HR 66 | Temp 97.8°F | Wt 139.0 lb

## 2019-08-07 DIAGNOSIS — R600 Localized edema: Secondary | ICD-10-CM

## 2019-08-07 DIAGNOSIS — F0391 Unspecified dementia with behavioral disturbance: Secondary | ICD-10-CM | POA: Diagnosis not present

## 2019-08-07 DIAGNOSIS — B20 Human immunodeficiency virus [HIV] disease: Secondary | ICD-10-CM | POA: Diagnosis not present

## 2019-08-07 DIAGNOSIS — I10 Essential (primary) hypertension: Secondary | ICD-10-CM | POA: Diagnosis not present

## 2019-08-07 LAB — BASIC METABOLIC PANEL
BUN: 20 mg/dL (ref 6–23)
CO2: 31 mEq/L (ref 19–32)
Calcium: 9.5 mg/dL (ref 8.4–10.5)
Chloride: 99 mEq/L (ref 96–112)
Creatinine, Ser: 1.26 mg/dL — ABNORMAL HIGH (ref 0.40–1.20)
GFR: 49.58 mL/min — ABNORMAL LOW (ref >60.00)
Glucose, Bld: 122 mg/dL — ABNORMAL HIGH (ref 70–99)
Potassium: 4 mEq/L (ref 3.5–5.1)
Sodium: 138 mEq/L (ref 135–145)

## 2019-08-07 LAB — BRAIN NATRIURETIC PEPTIDE: Pro B Natriuretic peptide (BNP): 110 pg/mL — ABNORMAL HIGH (ref 0.0–100.0)

## 2019-08-07 NOTE — Patient Instructions (Signed)
Peripheral Edema  Peripheral edema is swelling that is caused by a buildup of fluid. Peripheral edema most often affects the lower legs, ankles, and feet. It can also develop in the arms, hands, and face. The area of the body that has peripheral edema will look swollen. It may also feel heavy or warm. Your clothes may start to feel tight. Pressing on the area may make a temporary dent in your skin. You may not be able to move your swollen arm or leg as much as usual. There are many causes of peripheral edema. It can happen because of a complication of other conditions such as congestive heart failure, kidney disease, or a problem with your blood circulation. It also can be a side effect of certain medicines or because of an infection. It often happens to women during pregnancy. Sometimes, the cause is not known. Follow these instructions at home: Managing pain, stiffness, and swelling   Raise (elevate) your legs while you are sitting or lying down.  Move around often to prevent stiffness and to lessen swelling.  Do not sit or stand for long periods of time.  Wear support stockings as told by your health care provider. Medicines  Take over-the-counter and prescription medicines only as told by your health care provider.  Your health care provider may prescribe medicine to help your body get rid of excess water (diuretic). General instructions  Pay attention to any changes in your symptoms.  Follow instructions from your health care provider about limiting salt (sodium) in your diet. Sometimes, eating less salt may reduce swelling.  Moisturize skin daily to help prevent skin from cracking and draining.  Keep all follow-up visits as told by your health care provider. This is important. Contact a health care provider if you have:  A fever.  Edema that starts suddenly or is getting worse, especially if you are pregnant or have a medical condition.  Swelling in only one leg.  Increased  swelling, redness, or pain in one or both of your legs.  Drainage or sores at the area where you have edema. Get help right away if you:  Develop shortness of breath, especially when you are lying down.  Have pain in your chest or abdomen.  Feel weak.  Feel faint. Summary  Peripheral edema is swelling that is caused by a buildup of fluid. Peripheral edema most often affects the lower legs, ankles, and feet.  Move around often to prevent stiffness and to lessen swelling. Do not sit or stand for long periods of time.  Pay attention to any changes in your symptoms.  Contact a health care provider if you have edema that starts suddenly or is getting worse, especially if you are pregnant or have a medical condition.  Get help right away if you develop shortness of breath, especially when lying down. This information is not intended to replace advice given to you by your health care provider. Make sure you discuss any questions you have with your health care provider. Document Revised: 01/02/2018 Document Reviewed: 01/02/2018 Elsevier Patient Education  2020 Elsevier Inc.  

## 2019-08-07 NOTE — Progress Notes (Signed)
Subjective:    Patient ID: Amber Lopez, female    DOB: March 10, 1941, 79 y.o.   MRN: [id number]  No chief complaint on file.   HPI Patient was seen today for acute concern.  Pt with LE edema x several days.  Pt unable to answer questions 2/2 h/o dementia.  Per pt's son in law, pt with edema, no injury, thinks pt may have been eating more salt lately.  Not having bp checked at home.  Per son in law, pt only taking 3 meds--he is unsure of the names, but thinks on ly takin armidex, biktarvy, toprol XL. Denies recent f/u with ID.     Past Medical History:  Diagnosis Date  . Arthritis   . Depression   . Diabetes mellitus without complication (Amber Lopez)   . Eating disorder   . Hyperlipidemia   . Hypertension   . Migraines     No Known Allergies  Review of Systems  Unable to perform ROS: Dementia  Pt talks about school/the kids in her class.    Objective:    Blood pressure (!) 190/84, pulse 66, temperature 97.8 F (36.6 C), temperature source Temporal, weight 139 lb (63 kg), SpO2 97 %.   Gen. Pleasantly demented, well-nourished, in no distress, normal affect  HEENT: [place]/AT, face symmetric, no scleral icterus, PERRLA, EOMI, nares patent without drainage Lungs: no accessory muscle use, CTAB, no wheezes or rales Cardiovascular: RRR, no m/r/g, trace peripheral edema of ankles Musculoskeletal: Trace edema of b/l LEs at ankle.  No calf tenderness.  No deformities, no cyanosis or clubbing, normal tone Neuro:  A&Ox3, CN II-XII intact, normal gait Skin:  Warm, no lesions/ rash  Wt Readings from Last 3 Encounters:  08/07/19 139 lb (63 kg)  04/21/19 122 lb (55.3 kg)  02/27/19 110 lb (49.9 kg)    Lab Results  Component Value Date   WBC 4.5 03/13/2019   HGB 11.5 (L) 03/13/2019   HCT 35.1 03/13/2019   PLT 242 03/13/2019   GLUCOSE 114 (H) 03/13/2019   CHOL 282 (H) 03/13/2019   TRIG 128 03/13/2019   HDL 73 03/13/2019   LDLCALC 183 (H) 03/13/2019   ALT 9 03/13/2019   AST 16 03/13/2019   NA  138 03/13/2019   K 4.4 03/13/2019   CL 101 03/13/2019   CREATININE 1.45 (H) 03/13/2019   BUN 32 (H) 03/13/2019   CO2 29 03/13/2019    Assessment/Plan:  Bilateral lower extremity edema  -likely dependent edema from prolonged sitting -supportive care: elevating LEs when sitting, wearing compression socks, limiting sodium intake -given handout - Plan: Basic metabolic panel, Brain Natriuretic Peptide  HIV disease ([place]) -stable -continue Biktarvy -not currently taking bactrim ppx for OIs  -continue f/u with ID -per last ID note advised to f/u in 3 months.  Advised it is time to schedule f/u appt.  Dementia with behavioral disturbance, unspecified dementia type (Amber Lopez) -stable -not currently on Seroquel.  Family asked to check med bottles at home and confirm what pt is taking.  Essential hypertension -uncontrolled -continue Toprol XL 25 mg -advised to start checking pt's bp daily and keep a log to bring to clinic. -discussed the importance of lifestyle modifications such as limiting sodium intake. -will likely need med adjustment.  Increase Toprol vs addition of diuretic. -will start low dose hctz based on labs. -Plan: BMP  F/u 2-4 wks for bp  Grier Mitts, MD

## 2019-08-08 ENCOUNTER — Other Ambulatory Visit: Payer: Self-pay | Admitting: Family Medicine

## 2019-08-08 ENCOUNTER — Telehealth: Payer: Self-pay

## 2019-08-08 ENCOUNTER — Encounter: Payer: Self-pay | Admitting: Family Medicine

## 2019-08-08 DIAGNOSIS — I10 Essential (primary) hypertension: Secondary | ICD-10-CM

## 2019-08-08 MED ORDER — HYDROCHLOROTHIAZIDE 12.5 MG PO CAPS: 12.5000 mg | ORAL_CAPSULE | Freq: Every day | ORAL | 1 refills | Status: DC

## 2019-08-08 NOTE — Telephone Encounter (Signed)
COVID-19 Pre-Screening Questions:08/08/19  Do you currently have a fever (>100 F), chills or unexplained body aches? NO  Are you currently experiencing new cough, shortness of breath, sore throat, runny nose? NO  .  Have you recently travelled outside the state of [place] in the last 14 days? NO  .  Have you been in contact with someone that is currently pending confirmation of Covid19 testing or has been confirmed to have the [place] virus?  NO  **If the patient answers NO to ALL questions -  advise the patient to please call the clinic before coming to the office should any symptoms develop.

## 2019-08-11 ENCOUNTER — Ambulatory Visit: Payer: Medicare Other | Attending: Internal Medicine

## 2019-08-11 ENCOUNTER — Other Ambulatory Visit: Payer: Self-pay

## 2019-08-11 ENCOUNTER — Encounter: Payer: Self-pay | Admitting: Family

## 2019-08-11 ENCOUNTER — Ambulatory Visit: Payer: Medicare Other | Admitting: Family

## 2019-08-11 VITALS — BP 180/69 | HR 70 | Ht 62.0 in | Wt 136.4 lb

## 2019-08-11 DIAGNOSIS — Z0000 Encounter for general adult medical examination without abnormal findings: Secondary | ICD-10-CM

## 2019-08-11 DIAGNOSIS — B20 Human immunodeficiency virus [HIV] disease: Secondary | ICD-10-CM | POA: Diagnosis not present

## 2019-08-11 DIAGNOSIS — Z23 Encounter for immunization: Secondary | ICD-10-CM

## 2019-08-11 MED ORDER — BICTEGRAVIR-EMTRICITAB-TENOFOV 50-200-25 MG PO TABS: 1.0000 | ORAL_TABLET | Freq: Every day | ORAL | 5 refills | Status: DC

## 2019-08-11 NOTE — Progress Notes (Signed)
   Covid-19 Vaccination Clinic  Name:  Amber Lopez    MRN: [id number] DOB: 07/08/1940  08/11/2019  [name] was observed post Covid-19 immunization for 15 minutes without incident. She was provided with Vaccine Information Sheet and instruction to access the V-Safe system.   Ms. Canal was instructed to call 911 with any severe reactions post vaccine: [name] Kitchen Difficulty breathing  . Swelling of face and throat  . A fast heartbeat  . A bad rash all over body  . Dizziness and weakness   Immunizations Administered    Name Date Dose VIS Date Route   Pfizer COVID-19 Vaccine 08/11/2019  4:17 PM 0.3 mL 06/18/2018 Intramuscular   Manufacturer: [place]   Lot: [id number]   [place]: [address]-5

## 2019-08-11 NOTE — Patient Instructions (Addendum)
Nice to see you.   We will check your blood today.  Continue to take your Biktarvy.   Refills will be sent to the pharmacy.  Plan for follow up in 4 months or sooner if needed with lab work on the same day.   [email or web address]  [facility]  Have a great day and stay safe!

## 2019-08-11 NOTE — Progress Notes (Signed)
Subjective:    Patient ID: Amber Lopez, female    DOB: 11/30/1940, 79 y.o.   MRN: [id number]  Chief Complaint  Patient presents with  . Follow-up    2 month follow up     HPI:  Amber Lopez is a 79 y.o. female with HIV disease who was last seen in the office on 04/21/2019 with good adherence and tolerance to her ART regimen of Biktarvy.  Blood work at the time showed a viral load that was undetectable and CD4 count of 154.  She remained on Bactrim for OI prophylaxis. Amber Lopez is present today with her son.   Amber Lopez continues to take her Biktarvy as prescribed with no adverse side effects or missed doses.  Overall feeling well today with no new concerns/complaints.  She remains confused and disoriented secondary to dementia with history provided by son. Denies fevers, chills, night sweats, headaches, changes in vision, neck pain/stiffness, nausea, diarrhea, vomiting, lesions or rashes.  Amber Lopez has no problems obtaining her medication from the pharmacy and remains covered through [place] healthcare Medicare.  Denies feelings of being down, depressed, or hopeless recently.  No recreational or illicit drug use, tobacco use, or alcohol consumption.  She is not currently sexually active.   No Known Allergies    Outpatient Medications Prior to Visit  Medication Sig Dispense Refill  . anastrozole (ARIMIDEX) 1 MG tablet Take 1 mg by mouth daily.    . hydrochlorothiazide (MICROZIDE) 12.5 MG capsule Take 1 capsule (12.5 mg total) by mouth daily. 30 capsule 1  . MELATONIN PO Take 6 mg by mouth at bedtime.    . metoprolol succinate (TOPROL-XL) 25 MG 24 hr tablet Take 25 mg by mouth daily.    . bictegravir-emtricitabine-tenofovir AF (BIKTARVY) 50-200-25 MG TABS tablet Take 1 tablet by mouth daily. 30 tablet 3   No facility-administered medications prior to visit.     Past Medical History:  Diagnosis Date  . Arthritis   . Depression   . Diabetes mellitus without  complication ([place])   . Eating disorder   . Hyperlipidemia   . Hypertension   . Migraines      Past Surgical History:  Procedure Laterality Date  . BREAST BIOPSY    . BREAST LUMPECTOMY Right    Years ago / Pt's daughter thinks it was cancer       Review of Systems  Constitutional: Negative for appetite change, chills, diaphoresis, fatigue, fever and unexpected weight change.  Eyes:       Negative for acute change in vision  Respiratory: Negative for chest tightness, shortness of breath and wheezing.   Cardiovascular: Negative for chest pain.  Gastrointestinal: Negative for diarrhea, nausea and vomiting.  Genitourinary: Negative for dysuria, pelvic pain and vaginal discharge.  Musculoskeletal: Negative for neck pain and neck stiffness.  Skin: Negative for rash.  Neurological: Negative for seizures, syncope, weakness and headaches.  Hematological: Negative for adenopathy. Does not bruise/bleed easily.  Psychiatric/Behavioral: Negative for hallucinations.      Objective:    BP (!) 180/69 (BP Location: Left Arm)   Pulse 70   Ht 5\' 2"  (1.575 m)   Wt 136 lb 6.4 oz (61.9 kg)   SpO2 99%   BMI 24.95 kg/m  Nursing note and vital signs reviewed.  Physical Exam Constitutional:      General: She is not in acute distress.    Appearance: She is well-developed.     Comments: Seated in the chair; pleasantly confused.  Eyes:     Conjunctiva/sclera: Conjunctivae normal.  Cardiovascular:     Rate and Rhythm: Normal rate and regular rhythm.     Heart sounds: Normal heart sounds. No murmur. No friction rub. No gallop.   Pulmonary:     Effort: Pulmonary effort is normal. No respiratory distress.     Breath sounds: Normal breath sounds. No wheezing or rales.  Chest:     Chest wall: No tenderness.  Abdominal:     General: Bowel sounds are normal.     Palpations: Abdomen is soft.     Tenderness: There is no abdominal tenderness.  Musculoskeletal:     Cervical back: Neck supple.    Lymphadenopathy:     Cervical: No cervical adenopathy.  Skin:    General: Skin is warm and dry.     Findings: No rash.  Neurological:     Mental Status: She is alert. She is disoriented.      No flowsheet data found.     Assessment & Plan:    Patient Active Problem List   Diagnosis Date Noted  . Healthcare maintenance 08/12/2019  . HIV disease ([place]) 03/03/2019  . History of breast cancer 03/01/2019  . Dementia with behavioral disturbance ([place]) 03/01/2019  . Arthritis 03/01/2019  . Essential hypertension 03/01/2019     Problem List Items Addressed This Visit      Other   HIV disease ([place]) - Primary    Ms. Agresta continues to take her Biktarvy as prescribed with no adverse side effects or missed doses and has well-controlled AIDS with a viral load that remains undetectable.  Last CD4 count of 154.  Discussed previous lab work and plan of care.  Check blood work today.  Continue current dose of Biktarvy.  Plan for follow-up in 4 months or sooner if needed with lab work on the same day.      Relevant Medications   bictegravir-emtricitabine-tenofovir AF (BIKTARVY) 50-200-25 MG TABS tablet   Other Relevant Orders   COMPLETE METABOLIC PANEL WITH GFR (Completed)   HIV-1 RNA quant-no reflex-bld   T-helper cell (CD4)- ([facility] only)   Healthcare maintenance     Hold vaccinations today secondary to plan for Covid vaccination.  Discussed importance of safe sexual practice to reduce risk of STI.          I am having Amber Lopez maintain her anastrozole, MELATONIN PO, metoprolol succinate, hydrochlorothiazide, and bictegravir-emtricitabine-tenofovir AF.   Meds ordered this encounter  Medications  . bictegravir-emtricitabine-tenofovir AF (BIKTARVY) 50-200-25 MG TABS tablet    Sig: Take 1 tablet by mouth daily.    Dispense:  30 tablet    Refill:  5    Order Specific Question:   Supervising Provider    Answer:   Carlyle Basques [[id number]]     Follow-up: Return  in about 4 months (around 12/11/2019).   Terri Piedra, MSN, FNP-C Nurse Practitioner [facility] [place] number: [phone]

## 2019-08-12 ENCOUNTER — Encounter: Payer: Self-pay | Admitting: Family

## 2019-08-12 DIAGNOSIS — Z7189 Other specified counseling: Secondary | ICD-10-CM | POA: Insufficient documentation

## 2019-08-12 DIAGNOSIS — Z0000 Encounter for general adult medical examination without abnormal findings: Secondary | ICD-10-CM | POA: Insufficient documentation

## 2019-08-12 LAB — T-HELPER CELL (CD4) - (RCID CLINIC ONLY)
CD4 % Helper T Cell: 14 % — ABNORMAL LOW (ref 33–65)
CD4 T Cell Abs: 242 /uL — ABNORMAL LOW (ref 400–1790)

## 2019-08-12 NOTE — Assessment & Plan Note (Signed)
Amber Lopez continues to take her Biktarvy as prescribed with no adverse side effects or missed doses and has well-controlled AIDS with a viral load that remains undetectable.  Last CD4 count of 154.  Discussed previous lab work and plan of care.  Check blood work today.  Continue current dose of Biktarvy.  Plan for follow-up in 4 months or sooner if needed with lab work on the same day.

## 2019-08-12 NOTE — Assessment & Plan Note (Signed)
   Hold vaccinations today secondary to plan for Covid vaccination.  Discussed importance of safe sexual practice to reduce risk of STI.

## 2019-08-14 LAB — COMPLETE METABOLIC PANEL WITH GFR
AG Ratio: 1.2 (calc) (ref 1.0–2.5)
ALT: 7 U/L (ref 6–29)
AST: 14 U/L (ref 10–35)
Albumin: 4.1 g/dL (ref 3.6–5.1)
Alkaline phosphatase (APISO): 68 U/L (ref 37–153)
BUN/Creatinine Ratio: 17 (calc) (ref 6–22)
BUN: 22 mg/dL (ref 7–25)
CO2: 26 mmol/L (ref 20–32)
Calcium: 9.8 mg/dL (ref 8.6–10.4)
Chloride: 100 mmol/L (ref 98–110)
Creat: 1.26 mg/dL — ABNORMAL HIGH (ref 0.60–0.93)
GFR, Est African American: 47 mL/min/{1.73_m2} — ABNORMAL LOW (ref >=60)
GFR, Est Non African American: 41 mL/min/{1.73_m2} — ABNORMAL LOW (ref >=60)
Globulin: 3.4 g/dL (calc) (ref 1.9–3.7)
Glucose, Bld: 237 mg/dL — ABNORMAL HIGH (ref 65–99)
Potassium: 4 mmol/L (ref 3.5–5.3)
Sodium: 138 mmol/L (ref 135–146)
Total Bilirubin: 0.3 mg/dL (ref 0.2–1.2)
Total Protein: 7.5 g/dL (ref 6.1–8.1)

## 2019-08-14 LAB — HIV-1 RNA QUANT-NO REFLEX-BLD
HIV 1 RNA Quant: 23 copies/mL — ABNORMAL HIGH
HIV-1 RNA Quant, Log: 1.36 Log copies/mL — ABNORMAL HIGH

## 2019-08-16 DIAGNOSIS — E46 Unspecified protein-calorie malnutrition: Secondary | ICD-10-CM | POA: Diagnosis not present

## 2019-08-16 DIAGNOSIS — G9340 Encephalopathy, unspecified: Secondary | ICD-10-CM | POA: Diagnosis not present

## 2019-08-16 DIAGNOSIS — M6281 Muscle weakness (generalized): Secondary | ICD-10-CM | POA: Diagnosis not present

## 2019-08-25 ENCOUNTER — Ambulatory Visit (INDEPENDENT_AMBULATORY_CARE_PROVIDER_SITE_OTHER): Payer: Medicare Other | Admitting: Family Medicine

## 2019-08-25 ENCOUNTER — Other Ambulatory Visit: Payer: Self-pay

## 2019-08-25 ENCOUNTER — Encounter: Payer: Self-pay | Admitting: Family Medicine

## 2019-08-25 VITALS — BP 158/78 | HR 64 | Temp 97.9°F | Wt 137.0 lb

## 2019-08-25 DIAGNOSIS — I10 Essential (primary) hypertension: Secondary | ICD-10-CM

## 2019-08-25 DIAGNOSIS — F0391 Unspecified dementia with behavioral disturbance: Secondary | ICD-10-CM

## 2019-08-25 MED ORDER — HYDROCHLOROTHIAZIDE 25 MG PO TABS: 25.0000 mg | ORAL_TABLET | Freq: Every day | ORAL | 3 refills | Status: DC

## 2019-08-25 MED ORDER — POTASSIUM CHLORIDE CRYS ER 20 MEQ PO TBCR: 20.0000 meq | EXTENDED_RELEASE_TABLET | Freq: Every day | ORAL | 3 refills | Status: DC

## 2019-08-25 NOTE — Progress Notes (Signed)
Subjective:    Patient ID: Amber Lopez, female    DOB: [birth year], 79 y.o.   MRN: [id number]  No chief complaint on file.   HPI Patient was seen today for f/u on HTN.  Pt taking HCTZ 12.5 mg and metoprolol succinate 25 mg daily.  BP at home 164/72, 144/82, 137/93, 152/80, 94/60, 135/90, 140/85, 180/69, 160/76, 153/78.  BP noted to be 409 systolic last ID visit.  Patient drinking more water.  Patient's daughter notes pt has been more sleepy since getting first COVID vaccine/ starting new bp med.  Also notes pt had more hallucinations, but this is improving.  Past Medical History:  Diagnosis Date  . Arthritis   . Depression   . Diabetes mellitus without complication ([place])   . Eating disorder   . Hyperlipidemia   . Hypertension   . Migraines     No Known Allergies  ROS Unable to assess 2/2 dementia    Objective:    Blood pressure (!) 158/78, pulse 64, temperature 97.9 F (36.6 C), temperature source Temporal, weight 137 lb (62.1 kg), SpO2 97 %.   Gen. Pleasantly demented, well-nourished, in no distress, normal affect.  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 HEENT: [place]/AT, face symmetric, conjunctiva clear, no scleral icterus, PERRLA, EOMI, nares patent without drainage Lungs: no accessory muscle use, CTAB, no wheezes or rales Cardiovascular: RRR, no m/r/g, no peripheral edema Musculoskeletal: No deformities, no cyanosis or clubbing, normal tone Neuro:  A&Ox3, CN II-XII intact, normal gait Skin:  Warm, no lesions/ rash   Wt Readings from Last 3 Encounters:  08/25/19 137 lb (62.1 kg)  08/11/19 136 lb 6.4 oz (61.9 kg)  08/07/19 139 lb (63 kg)    Lab Results  Component Value Date   WBC 4.5 03/13/2019   HGB 11.5 (L) 03/13/2019   HCT 35.1 03/13/2019   PLT 242 03/13/2019   GLUCOSE 237 (H) 08/11/2019   CHOL 282 (H) 03/13/2019   TRIG 128 03/13/2019   HDL 73 03/13/2019   LDLCALC 183 (H) 03/13/2019   ALT 7 08/11/2019   AST 14 08/11/2019    NA 138 08/11/2019   K 4.0 08/11/2019   CL 100 08/11/2019   CREATININE 1.26 (H) 08/11/2019   BUN 22 08/11/2019   CO2 26 08/11/2019    Assessment/Plan:  Essential hypertension  -Uncontrolled -Discussed lifestyle modifications -Continue checking BP at home -Continue metoprolol succinate 25 mg. -We will increase HCTZ from 12.5 mg to 25 mg daily and start Klor-Con 20 mEq daily -Given precautions - Plan: hydrochlorothiazide (HYDRODIURIL) 25 MG tablet, potassium chloride SA (KLOR-CON) 20 MEQ tablet  Dementia with behavioral disturbance, unspecified dementia type (HCC) -Stable -Increased hallucinations status post Covid vaccine or addition of HCTZ -We will continue to monitor -If needed will restart Seroquel   F/u in 1 month  Grier Mitts, MD

## 2019-08-25 NOTE — Patient Instructions (Addendum)
Continue taking metoprolol XL 25 mg daily.  We will increase the hydrochlorothiazide from 12.5 mg to 25 mg daily.  New prescription was sent to your pharmacy.  Prescription for potassium supplement, Klor-Con was also sent to the pharmacy.  Continue checking BP daily  Hypertension, Adult High blood pressure (hypertension) is when the force of blood pumping through the arteries is too strong. The arteries are the blood vessels that carry blood from the heart throughout the body. Hypertension forces the heart to work harder to pump blood and may cause arteries to become narrow or stiff. Untreated or uncontrolled hypertension can cause a heart attack, heart failure, a stroke, kidney disease, and other problems. A blood pressure reading consists of a higher number over a lower number. Ideally, your blood pressure should be below 120/80. The first ("top") number is called the systolic pressure. It is a measure of the pressure in your arteries as your heart beats. The second ("bottom") number is called the diastolic pressure. It is a measure of the pressure in your arteries as the heart relaxes. What are the causes? The exact cause of this condition is not known. There are some conditions that result in or are related to high blood pressure. What increases the risk? Some risk factors for high blood pressure are under your control. The following factors may make you more likely to develop this condition:  Smoking.  Having type 2 diabetes mellitus, high cholesterol, or both.  Not getting enough exercise or physical activity.  Being overweight.  Having too much fat, sugar, calories, or salt (sodium) in your diet.  Drinking too much alcohol. Some risk factors for high blood pressure may be difficult or impossible to change. Some of these factors include:  Having chronic kidney disease.  Having a family history of high blood pressure.  Age. Risk increases with age.  Race. You may be at higher risk  if you are African American.  Gender. Men are at higher risk than women before age 53. After age 57, women are at higher risk than men.  Having obstructive sleep apnea.  Stress. What are the signs or symptoms? High blood pressure may not cause symptoms. Very high blood pressure (hypertensive crisis) may cause:  Headache.  Anxiety.  Shortness of breath.  Nosebleed.  Nausea and vomiting.  Vision changes.  Severe chest pain.  Seizures. How is this diagnosed? This condition is diagnosed by measuring your blood pressure while you are seated, with your arm resting on a flat surface, your legs uncrossed, and your feet flat on the floor. The cuff of the blood pressure monitor will be placed directly against the skin of your upper arm at the level of your heart. It should be measured at least twice using the same arm. Certain conditions can cause a difference in blood pressure between your right and left arms. Certain factors can cause blood pressure readings to be lower or higher than normal for a short period of time:  When your blood pressure is higher when you are in a health care provider's office than when you are at home, this is called white coat hypertension. Most people with this condition do not need medicines.  When your blood pressure is higher at home than when you are in a health care provider's office, this is called masked hypertension. Most people with this condition may need medicines to control blood pressure. If you have a high blood pressure reading during one visit or you have normal blood pressure  with other risk factors, you may be asked to:  Return on a different day to have your blood pressure checked again.  Monitor your blood pressure at home for 1 week or longer. If you are diagnosed with hypertension, you may have other blood or imaging tests to help your health care provider understand your overall risk for other conditions. How is this treated? This  condition is treated by making healthy lifestyle changes, such as eating healthy foods, exercising more, and reducing your alcohol intake. Your health care provider may prescribe medicine if lifestyle changes are not enough to get your blood pressure under control, and if:  Your systolic blood pressure is above 130.  Your diastolic blood pressure is above 80. Your personal target blood pressure may vary depending on your medical conditions, your age, and other factors. Follow these instructions at home: Eating and drinking   Eat a diet that is high in fiber and potassium, and low in sodium, added sugar, and fat. An example eating plan is called the DASH (Dietary Approaches to Stop Hypertension) diet. To eat this way: ? Eat plenty of fresh fruits and vegetables. Try to fill one half of your plate at each meal with fruits and vegetables. ? Eat whole grains, such as whole-wheat pasta, brown rice, or whole-grain bread. Fill about one fourth of your plate with whole grains. ? Eat or drink low-fat dairy products, such as skim milk or low-fat yogurt. ? Avoid fatty cuts of meat, processed or cured meats, and poultry with skin. Fill about one fourth of your plate with lean proteins, such as fish, chicken without skin, beans, eggs, or tofu. ? Avoid pre-made and processed foods. These tend to be higher in sodium, added sugar, and fat.  Reduce your daily sodium intake. Most people with hypertension should eat less than 1,500 mg of sodium a day.  Do not drink alcohol if: ? Your health care provider tells you not to drink. ? You are pregnant, may be pregnant, or are planning to become pregnant.  If you drink alcohol: ? Limit how much you use to:  0-1 drink a day for women.  0-2 drinks a day for men. ? Be aware of how much alcohol is in your drink. In the [place] one drink equals one 12 oz bottle of beer (355 mL), one 5 oz glass of wine (148 mL), or one 1 oz glass of hard liquor (44  mL). Lifestyle   Work with your health care provider to maintain a healthy body weight or to lose weight. Ask what an ideal weight is for you.  Get at least 30 minutes of exercise most days of the week. Activities may include walking, swimming, or biking.  Include exercise to strengthen your muscles (resistance exercise), such as Pilates or lifting weights, as part of your weekly exercise routine. Try to do these types of exercises for 30 minutes at least 3 days a week.  Do not use any products that contain nicotine or tobacco, such as cigarettes, e-cigarettes, and chewing tobacco. If you need help quitting, ask your health care provider.  Monitor your blood pressure at home as told by your health care provider.  Keep all follow-up visits as told by your health care provider. This is important. Medicines  Take over-the-counter and prescription medicines only as told by your health care provider. Follow directions carefully. Blood pressure medicines must be taken as prescribed.  Do not skip doses of blood pressure medicine. Doing this puts you at  risk for problems and can make the medicine less effective.  Ask your health care provider about side effects or reactions to medicines that you should watch for. Contact a health care provider if you:  Think you are having a reaction to a medicine you are taking.  Have headaches that keep coming back (recurring).  Feel dizzy.  Have swelling in your ankles.  Have trouble with your vision. Get help right away if you:  Develop a severe headache or confusion.  Have unusual weakness or numbness.  Feel faint.  Have severe pain in your chest or abdomen.  Vomit repeatedly.  Have trouble breathing. Summary  Hypertension is when the force of blood pumping through your arteries is too strong. If this condition is not controlled, it may put you at risk for serious complications.  Your personal target blood pressure may vary depending on  your medical conditions, your age, and other factors. For most people, a normal blood pressure is less than 120/80.  Hypertension is treated with lifestyle changes, medicines, or a combination of both. Lifestyle changes include losing weight, eating a healthy, low-sodium diet, exercising more, and limiting alcohol. This information is not intended to replace advice given to you by your health care provider. Make sure you discuss any questions you have with your health care provider. Document Revised: 12/19/2017 Document Reviewed: 12/19/2017 Elsevier Patient Education  [place] DASH stands for "Dietary Approaches to Stop Hypertension." The DASH eating plan is a healthy eating plan that has been shown to reduce high blood pressure (hypertension). It may also reduce your risk for type 2 diabetes, heart disease, and stroke. The DASH eating plan may also help with weight loss. What are tips for following this plan?  General guidelines  Avoid eating more than 2,300 mg (milligrams) of salt (sodium) a day. If you have hypertension, you may need to reduce your sodium intake to 1,500 mg a day.  Limit alcohol intake to no more than 1 drink a day for nonpregnant women and 2 drinks a day for men. One drink equals 12 oz of beer, 5 oz of wine, or 1 oz of hard liquor.  Work with your health care provider to maintain a healthy body weight or to lose weight. Ask what an ideal weight is for you.  Get at least 30 minutes of exercise that causes your heart to beat faster (aerobic exercise) most days of the week. Activities may include walking, swimming, or biking.  Work with your health care provider or diet and nutrition specialist (dietitian) to adjust your eating plan to your individual calorie needs. Reading food labels   Check food labels for the amount of sodium per serving. Choose foods with less than 5 percent of the Daily Value of sodium. Generally, foods with less than 300  mg of sodium per serving fit into this eating plan.  To find whole grains, look for the word "whole" as the first word in the ingredient list. Shopping  Buy products labeled as "low-sodium" or "no salt added."  Buy fresh foods. Avoid canned foods and premade or frozen meals. Cooking  Avoid adding salt when cooking. Use salt-free seasonings or herbs instead of table salt or sea salt. Check with your health care provider or pharmacist before using salt substitutes.  Do not fry foods. Cook foods using healthy methods such as baking, boiling, grilling, and broiling instead.  Cook with heart-healthy oils, such as olive, canola, soybean, or sunflower oil. Meal  planning  Eat a balanced diet that includes: ? 5 or more servings of fruits and vegetables each day. At each meal, try to fill half of your plate with fruits and vegetables. ? Up to 6-8 servings of whole grains each day. ? Less than 6 oz of lean meat, poultry, or fish each day. A 3-oz serving of meat is about the same size as a deck of cards. One egg equals 1 oz. ? 2 servings of low-fat dairy each day. ? A serving of nuts, seeds, or beans 5 times each week. ? Heart-healthy fats. Healthy fats called Omega-3 fatty acids are found in foods such as flaxseeds and coldwater fish, like sardines, salmon, and mackerel.  Limit how much you eat of the following: ? Canned or prepackaged foods. ? Food that is high in trans fat, such as fried foods. ? Food that is high in saturated fat, such as fatty meat. ? Sweets, desserts, sugary drinks, and other foods with added sugar. ? Full-fat dairy products.  Do not salt foods before eating.  Try to eat at least 2 vegetarian meals each week.  Eat more home-cooked food and less restaurant, buffet, and fast food.  When eating at a restaurant, ask that your food be prepared with less salt or no salt, if possible. What foods are recommended? The items listed may not be a complete list. Talk with your  dietitian about what dietary choices are best for you. Grains Whole-grain or whole-wheat bread. Whole-grain or whole-wheat pasta. Brown rice. [place]. Bulgur. Whole-grain and low-sodium cereals. Pita bread. Low-fat, low-sodium crackers. Whole-wheat flour tortillas. Vegetables Fresh or frozen vegetables (raw, steamed, roasted, or grilled). Low-sodium or reduced-sodium tomato and vegetable juice. Low-sodium or reduced-sodium tomato sauce and tomato paste. Low-sodium or reduced-sodium canned vegetables. Fruits All fresh, dried, or frozen fruit. Canned fruit in natural juice (without added sugar). Meat and other protein foods Skinless chicken or [place]. Ground chicken or [place]. Pork with fat trimmed off. Fish and seafood. Egg whites. Dried beans, peas, or lentils. Unsalted nuts, nut butters, and seeds. Unsalted canned beans. Lean cuts of beef with fat trimmed off. Low-sodium, lean deli meat. Dairy Low-fat (1%) or fat-free (skim) milk. Fat-free, low-fat, or reduced-fat cheeses. Nonfat, low-sodium ricotta or cottage cheese. Low-fat or nonfat yogurt. Low-fat, low-sodium cheese. Fats and oils Soft margarine without trans fats. Vegetable oil. Low-fat, reduced-fat, or light mayonnaise and salad dressings (reduced-sodium). Canola, safflower, olive, soybean, and sunflower oils. Avocado. Seasoning and other foods Herbs. Spices. Seasoning mixes without salt. Unsalted popcorn and pretzels. Fat-free sweets. What foods are not recommended? The items listed may not be a complete list. Talk with your dietitian about what dietary choices are best for you. Grains Baked goods made with fat, such as croissants, muffins, or some breads. Dry pasta or rice meal packs. Vegetables Creamed or fried vegetables. Vegetables in a cheese sauce. Regular canned vegetables (not low-sodium or reduced-sodium). Regular canned tomato sauce and paste (not low-sodium or reduced-sodium). Regular tomato and vegetable juice (not  low-sodium or reduced-sodium). Angie Fava. Olives. Fruits Canned fruit in a light or heavy syrup. Fried fruit. Fruit in cream or butter sauce. Meat and other protein foods Fatty cuts of meat. Ribs. Fried meat. Berniece Salines. Sausage. Bologna and other processed lunch meats. Salami. Fatback. Hotdogs. Bratwurst. Salted nuts and seeds. Canned beans with added salt. Canned or smoked fish. Whole eggs or egg yolks. Chicken or [place] with skin. Dairy Whole or 2% milk, cream, and half-and-half. Whole or full-fat cream cheese. Whole-fat or  sweetened yogurt. Full-fat cheese. Nondairy creamers. Whipped toppings. Processed cheese and cheese spreads. Fats and oils Butter. Stick margarine. Lard. Shortening. Ghee. Bacon fat. Tropical oils, such as coconut, palm kernel, or palm oil. Seasoning and other foods Salted popcorn and pretzels. Onion salt, garlic salt, seasoned salt, table salt, and sea salt. Worcestershire sauce. Tartar sauce. Barbecue sauce. Teriyaki sauce. Soy sauce, including reduced-sodium. Steak sauce. Canned and packaged gravies. Fish sauce. Oyster sauce. Cocktail sauce. Horseradish that you find on the shelf. Ketchup. Mustard. Meat flavorings and tenderizers. Bouillon cubes. Hot sauce and Tabasco sauce. Premade or packaged marinades. Premade or packaged taco seasonings. Relishes. Regular salad dressings. Where to find more information:  National Heart, Lung, and [place]: https://wilson-eaton.com/  American Heart Association: www.heart.org Summary  The DASH eating plan is a healthy eating plan that has been shown to reduce high blood pressure (hypertension). It may also reduce your risk for type 2 diabetes, heart disease, and stroke.  With the DASH eating plan, you should limit salt (sodium) intake to 2,300 mg a day. If you have hypertension, you may need to reduce your sodium intake to 1,500 mg a day.  When on the DASH eating plan, aim to eat more fresh fruits and vegetables, whole grains, lean proteins,  low-fat dairy, and heart-healthy fats.  Work with your health care provider or diet and nutrition specialist (dietitian) to adjust your eating plan to your individual calorie needs. This information is not intended to replace advice given to you by your health care provider. Make sure you discuss any questions you have with your health care provider. Document Revised: 03/23/2017 Document Reviewed: 04/03/2016 Elsevier Patient Education  2020 [employer].

## 2019-09-01 ENCOUNTER — Ambulatory Visit: Payer: Medicare Other | Attending: Internal Medicine

## 2019-09-01 DIAGNOSIS — Z23 Encounter for immunization: Secondary | ICD-10-CM

## 2019-09-01 NOTE — Progress Notes (Signed)
   Covid-19 Vaccination Clinic  Name:  Amber Lopez    MRN: [id number] DOB: Oct 24, 1940  09/01/2019  [name] was observed post Covid-19 immunization for 15 minutes without incident. She was provided with Vaccine Information Sheet and instruction to access the V-Safe system.   [name] was instructed to call 911 with any severe reactions post vaccine: [name] Kitchen Difficulty breathing  . Swelling of face and throat  . A fast heartbeat  . A bad rash all over body  . Dizziness and weakness   Immunizations Administered    Name Date Dose VIS Date Route   Pfizer COVID-19 Vaccine 09/01/2019  4:12 PM 0.3 mL 06/18/2018 Intramuscular   Manufacturer: [place]   Lot: [id number]   [place]: [id number]

## 2019-09-15 DIAGNOSIS — M6281 Muscle weakness (generalized): Secondary | ICD-10-CM | POA: Diagnosis not present

## 2019-09-15 DIAGNOSIS — E46 Unspecified protein-calorie malnutrition: Secondary | ICD-10-CM | POA: Diagnosis not present

## 2019-09-15 DIAGNOSIS — G9340 Encephalopathy, unspecified: Secondary | ICD-10-CM | POA: Diagnosis not present

## 2019-09-26 ENCOUNTER — Other Ambulatory Visit: Payer: Self-pay

## 2019-09-29 ENCOUNTER — Encounter: Payer: Self-pay | Admitting: Family Medicine

## 2019-09-29 ENCOUNTER — Other Ambulatory Visit: Payer: Self-pay

## 2019-09-29 ENCOUNTER — Ambulatory Visit (INDEPENDENT_AMBULATORY_CARE_PROVIDER_SITE_OTHER): Payer: Medicare Other | Admitting: Family Medicine

## 2019-09-29 VITALS — BP 158/78 | HR 70 | Temp 97.8°F | Wt 136.0 lb

## 2019-09-29 DIAGNOSIS — L602 Onychogryphosis: Secondary | ICD-10-CM | POA: Diagnosis not present

## 2019-09-29 DIAGNOSIS — B001 Herpesviral vesicular dermatitis: Secondary | ICD-10-CM | POA: Diagnosis not present

## 2019-09-29 DIAGNOSIS — H9190 Unspecified hearing loss, unspecified ear: Secondary | ICD-10-CM | POA: Diagnosis not present

## 2019-09-29 DIAGNOSIS — I10 Essential (primary) hypertension: Secondary | ICD-10-CM

## 2019-09-29 DIAGNOSIS — F0391 Unspecified dementia with behavioral disturbance: Secondary | ICD-10-CM

## 2019-09-29 DIAGNOSIS — B20 Human immunodeficiency virus [HIV] disease: Secondary | ICD-10-CM

## 2019-09-29 NOTE — Progress Notes (Signed)
Subjective:    Patient ID: Amber Lopez, female    DOB: April 24, 1941, 79 y.o.   MRN: [id number]  No chief complaint on file. Patient accompanied by her daughter Amber Lopez.  HPI Pt is a 79 year old female with pmh sig for HTN, dementia with behavioral disturbance, arthritis, h/o breast cancer on anastrozole, and HIV who was seen for follow-up.  Pt taking Toprol-XL 25 mg and HCTZ 25 mg daily for BP.  Checking BP daily at home.  Per pt's daughter Kdur pills crumbling when put in pill planner. Unable to remember bp readings, but around 409 systolic.  Eating more [place], chicken, and fish daily.  Pt has a fever blister on left lower lip, putting Blistex on the bump.  Pt also endorses intermittent RUE pain s/p fall last year.  Does not take anything for the discomfort  Pt's daughter asked about adult daycare for pt, podiatry referral, and whether or not it is safe for patient to go to the gym.  Also mentions patient with decreased hearing.  Past Medical History:  Diagnosis Date  . Arthritis   . Depression   . Diabetes mellitus without complication ([place])   . Eating disorder   . Hyperlipidemia   . Hypertension   . Migraines     No Known Allergies  ROS General: Denies fever, chills, night sweats, changes in weight, changes in appetite  + dementia HEENT: Denies headaches, ear pain, changes in vision, rhinorrhea, sore throat CV: Denies CP, palpitations, SOB, orthopnea Pulm: Denies SOB, cough, wheezing GI: Denies abdominal pain, nausea, vomiting, diarrhea, constipation GU: Denies dysuria, hematuria, frequency, vaginal discharge Msk: Denies muscle cramps, joint pains  +RUE pain Neuro: Denies weakness, numbness, tingling Skin: Denies rashes, bruising  +cold sore Psych: Denies depression, anxiety, hallucinations      Objective:    Blood pressure (!) 158/78, pulse 70, temperature 97.8 F (36.6 C), temperature source Temporal, weight 136 lb (61.7 kg), SpO2 96 %.  Gen. 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 Lungs: no accessory muscle use, CTAB, no wheezes or rales Cardiovascular: RRR, no m/r/g, no peripheral edema Musculoskeletal: No TTP of RUE or decreased ROM.  No deformities, no cyanosis or clubbing, normal tone Neuro:  A&Ox3, CN II-XII intact, normal gait Skin:  Warm, no rash.  Herpetic labialis lesion on left lower lip.   Wt Readings from Last 3 Encounters:  09/29/19 136 lb (61.7 kg)  08/25/19 137 lb (62.1 kg)  08/11/19 136 lb 6.4 oz (61.9 kg)    Lab Results  Component Value Date   WBC 4.5 03/13/2019   HGB 11.5 (L) 03/13/2019   HCT 35.1 03/13/2019   PLT 242 03/13/2019   GLUCOSE 237 (H) 08/11/2019   CHOL 282 (H) 03/13/2019   TRIG 128 03/13/2019   HDL 73 03/13/2019   LDLCALC 183 (H) 03/13/2019   ALT 7 08/11/2019   AST 14 08/11/2019   NA 138 08/11/2019   K 4.0 08/11/2019   CL 100 08/11/2019   CREATININE 1.26 (H) 08/11/2019   BUN 22 08/11/2019   CO2 26 08/11/2019    Assessment/Plan:  Essential hypertension -Elevated in clinic -We will recheck -Continue current meds including Toprol-XL 25 mg daily and HCTZ 25 mg daily with Klor-Con 20 mEq -Pt's daughter to contact clinic with BP readings.  HIV  disease -Stable -Continue Biktarvy -Continue follow-up with ID  Fever blister -Continue supportive care -Given handout  Decreased hearing, unspecified laterality -Hearing checked this visit with deficits noted - Plan: Ambulatory referral to Audiology  Nail overgrowth  - Plan: Ambulatory referral to Podiatry  Dementia with behavioral disturbances, unspecified type -Stable -Not currently on med -Given handout about area day programs.  F/u prn in the next 1-2 months  Grier Mitts, MD

## 2019-09-29 NOTE — Patient Instructions (Addendum)
Hypertension, Adult High blood pressure (hypertension) is when the force of blood pumping through the arteries is too strong. The arteries are the blood vessels that carry blood from the heart throughout the body. Hypertension forces the heart to work harder to pump blood and may cause arteries to become narrow or stiff. Untreated or uncontrolled hypertension can cause a heart attack, heart failure, a stroke, kidney disease, and other problems. A blood pressure reading consists of a higher number over a lower number. Ideally, your blood pressure should be below 120/80. The first ("top") number is called the systolic pressure. It is a measure of the pressure in your arteries as your heart beats. The second ("bottom") number is called the diastolic pressure. It is a measure of the pressure in your arteries as the heart relaxes. What are the causes? The exact cause of this condition is not known. There are some conditions that result in or are related to high blood pressure. What increases the risk? Some risk factors for high blood pressure are under your control. The following factors may make you more likely to develop this condition:  Smoking.  Having type 2 diabetes mellitus, high cholesterol, or both.  Not getting enough exercise or physical activity.  Being overweight.  Having too much fat, sugar, calories, or salt (sodium) in your diet.  Drinking too much alcohol. Some risk factors for high blood pressure may be difficult or impossible to change. Some of these factors include:  Having chronic kidney disease.  Having a family history of high blood pressure.  Age. Risk increases with age.  Race. You may be at higher risk if you are African American.  Gender. Men are at higher risk than women before age 5. After age 77, women are at higher risk than men.  Having obstructive sleep apnea.  Stress. What are the signs or symptoms? High blood pressure may not cause symptoms. Very high  blood pressure (hypertensive crisis) may cause:  Headache.  Anxiety.  Shortness of breath.  Nosebleed.  Nausea and vomiting.  Vision changes.  Severe chest pain.  Seizures. How is this diagnosed? This condition is diagnosed by measuring your blood pressure while you are seated, with your arm resting on a flat surface, your legs uncrossed, and your feet flat on the floor. The cuff of the blood pressure monitor will be placed directly against the skin of your upper arm at the level of your heart. It should be measured at least twice using the same arm. Certain conditions can cause a difference in blood pressure between your right and left arms. Certain factors can cause blood pressure readings to be lower or higher than normal for a short period of time:  When your blood pressure is higher when you are in a health care provider's office than when you are at home, this is called white coat hypertension. Most people with this condition do not need medicines.  When your blood pressure is higher at home than when you are in a health care provider's office, this is called masked hypertension. Most people with this condition may need medicines to control blood pressure. If you have a high blood pressure reading during one visit or you have normal blood pressure with other risk factors, you may be asked to:  Return on a different day to have your blood pressure checked again.  Monitor your blood pressure at home for 1 week or longer. If you are diagnosed with hypertension, you may have other blood or  imaging tests to help your health care provider understand your overall risk for other conditions. How is this treated? This condition is treated by making healthy lifestyle changes, such as eating healthy foods, exercising more, and reducing your alcohol intake. Your health care provider may prescribe medicine if lifestyle changes are not enough to get your blood pressure under control, and  if:  Your systolic blood pressure is above 130.  Your diastolic blood pressure is above 80. Your personal target blood pressure may vary depending on your medical conditions, your age, and other factors. Follow these instructions at home: Eating and drinking   Eat a diet that is high in fiber and potassium, and low in sodium, added sugar, and fat. An example eating plan is called the DASH (Dietary Approaches to Stop Hypertension) diet. To eat this way: ? Eat plenty of fresh fruits and vegetables. Try to fill one half of your plate at each meal with fruits and vegetables. ? Eat whole grains, such as whole-wheat pasta, brown rice, or whole-grain bread. Fill about one fourth of your plate with whole grains. ? Eat or drink low-fat dairy products, such as skim milk or low-fat yogurt. ? Avoid fatty cuts of meat, processed or cured meats, and poultry with skin. Fill about one fourth of your plate with lean proteins, such as fish, chicken without skin, beans, eggs, or tofu. ? Avoid pre-made and processed foods. These tend to be higher in sodium, added sugar, and fat.  Reduce your daily sodium intake. Most people with hypertension should eat less than 1,500 mg of sodium a day.  Do not drink alcohol if: ? Your health care provider tells you not to drink. ? You are pregnant, may be pregnant, or are planning to become pregnant.  If you drink alcohol: ? Limit how much you use to:  0-1 drink a day for women.  0-2 drinks a day for men. ? Be aware of how much alcohol is in your drink. In the [place] one drink equals one 12 oz bottle of beer (355 mL), one 5 oz glass of wine (148 mL), or one 1 oz glass of hard liquor (44 mL). Lifestyle   Work with your health care provider to maintain a healthy body weight or to lose weight. Ask what an ideal weight is for you.  Get at least 30 minutes of exercise most days of the week. Activities may include walking, swimming, or biking.  Include exercise to  strengthen your muscles (resistance exercise), such as Pilates or lifting weights, as part of your weekly exercise routine. Try to do these types of exercises for 30 minutes at least 3 days a week.  Do not use any products that contain nicotine or tobacco, such as cigarettes, e-cigarettes, and chewing tobacco. If you need help quitting, ask your health care provider.  Monitor your blood pressure at home as told by your health care provider.  Keep all follow-up visits as told by your health care provider. This is important. Medicines  Take over-the-counter and prescription medicines only as told by your health care provider. Follow directions carefully. Blood pressure medicines must be taken as prescribed.  Do not skip doses of blood pressure medicine. Doing this puts you at risk for problems and can make the medicine less effective.  Ask your health care provider about side effects or reactions to medicines that you should watch for. Contact a health care provider if you:  Think you are having a reaction to a medicine you  are taking.  Have headaches that keep coming back (recurring).  Feel dizzy.  Have swelling in your ankles.  Have trouble with your vision. Get help right away if you:  Develop a severe headache or confusion.  Have unusual weakness or numbness.  Feel faint.  Have severe pain in your chest or abdomen.  Vomit repeatedly.  Have trouble breathing. Summary  Hypertension is when the force of blood pumping through your arteries is too strong. If this condition is not controlled, it may put you at risk for serious complications.  Your personal target blood pressure may vary depending on your medical conditions, your age, and other factors. For most people, a normal blood pressure is less than 120/80.  Hypertension is treated with lifestyle changes, medicines, or a combination of both. Lifestyle changes include losing weight, eating a healthy, low-sodium diet,  exercising more, and limiting alcohol. This information is not intended to replace advice given to you by your health care provider. Make sure you discuss any questions you have with your health care provider. Document Revised: 12/19/2017 Document Reviewed: 12/19/2017 Elsevier Patient Education  [place].  Cold Sore  A cold sore, also called a fever blister, is a small, fluid-filled sore that forms inside of the mouth or on the lips, gums, nose, chin, or cheeks. Cold sores can spread to other parts of the body, such as the eyes or fingers. Cold sores can spread from person to person (are contagious) until the sores crust over completely. Most cold sores go away within 2 weeks. What are the causes? Cold sores are caused by a virus (herpes simplex virus type 1, HSV-1). The virus can spread from person to person through close contact, such as through:  Kissing.  Touching the affected area.  Sharing personal items such as lip balm, razors, a drinking glass, or eating utensils. What increases the risk? You are more likely to develop this condition if you:  Are tired, stressed, or sick.  Are having your period (menstruating).  Are pregnant.  Take certain medicines.  Are out in cold weather or get too much sun. What are the signs or symptoms? Symptoms of a cold sore outbreak go through different stages. These are the stages of a cold sore:  Tingling, itching, or burning is felt 1-2 days before the outbreak.  Fluid-filled blisters appear on the lips, inside the mouth, on the nose, or on the cheeks.  The blisters start to ooze clear fluid.  The blisters dry up, and a yellow crust appears in their place.  The crust falls off. In some cases, other symptoms can develop during a cold sore outbreak. These can include:  Fever.  Sore throat.  Headache.  Muscle aches.  Swollen neck glands. How is this treated? There is no cure for cold sores or the virus that causes  them. There is also no vaccine to prevent the virus. Most cold sores go away on their own without treatment within 2 weeks. Your doctor may prescribe medicines to:  Help with pain.  Keep the virus from growing.  Help you heal faster. Medicines may be in the form of creams, gels, pills, or a shot. Follow these instructions at home: Medicines  Take or apply over-the-counter and prescription medicines only as told by your doctor.  Use a cotton-tip swab to apply creams or gels to your sores.  Ask your doctor if you can take lysine supplements. These may help with healing. Sore care   Do not touch  the sores or pick the scabs.  Wash your hands often. Do not touch your eyes without washing your hands first.  Keep the sores clean and dry.  If told, put ice on the sores: ? Put ice in a plastic bag. ? Place a towel between your skin and the bag. ? Leave the ice on for 20 minutes, 2-3 times a day. Eating and drinking  Eat a soft, bland diet. Avoid eating hot, cold, or salty foods. These can hurt your mouth.  Use a straw if it hurts to drink out of a glass.  Eat foods that have a lot of lysine in them. These include meat, fish, and dairy products.  Avoid sugary foods, chocolates, nuts, and grains. These foods have a high amount of a substance (arginine) that can cause the virus to grow. Lifestyle  Do not kiss, have oral sex, or share personal items until your sores heal.  Stress, poor sleep, and being out in the sun can trigger outbreaks. Make sure you: ? Do activities that help you relax, such as deep breathing exercises or meditation. ? Get enough sleep. ? Apply sunscreen on your lips before you go out in the sun. Contact a doctor if:  You have symptoms for more than 2 weeks.  You have pus coming from the sores.  You have redness that is spreading.  You have pain or irritation in your eye.  You get sores on your genitals.  Your sores do not heal within 2 weeks.  You  get cold sores often. Get help right away if:  You have a fever and your symptoms suddenly get worse.  You have a headache and confusion.  You have tiredness (fatigue).  You do not want to eat as much as normal (loss of appetite).  You have a stiff neck or are sensitive to light. Summary  A cold sore is a small, fluid-filled sore that forms inside of the mouth or on the lips, gums, nose, chin, or cheeks.  Cold sores can spread from person to person (are contagious) until the sores crust over completely. Most cold sores go away within 2 weeks.  Wash your hands often. Do not touch your eyes without washing your hands first.  Do not kiss, have oral sex, or share personal items until your sores heal.  Contact a doctor if your sores do not heal within 2 weeks. This information is not intended to replace advice given to you by your health care provider. Make sure you discuss any questions you have with your health care provider. Document Revised: 07/31/2018 Document Reviewed: 09/10/2017 Elsevier Patient Education  [place].  Preventing Hearing Loss, Adult  Hearing loss is a partial or total loss of the ability to hear. Hearing loss may start suddenly or gradually, at any age. It may be temporary or permanent, and it may affect one or both ears. There are two types of hearing loss. You can have just one type or both types. You may have a problem with:  Damage to your hearing nerves (sensorineural hearing loss). This type of hearing loss is more likely to be permanent. A hearing aid is often the best treatment.  Sound getting to your inner ear (conductive hearing loss). This type of hearing loss can usually be treated medically or surgically. Hearing loss may be referred to as deafness. Symptoms that may develop along with hearing loss include ringing in your ear (tinnitus), fullness in your ear, and dizziness (vertigo). Hearing loss that  is not associated with aging can often be  prevented by taking certain measures to protect your ears. How can hearing loss affect me? Hearing loss can affect you at school, at work, and at home. It can lower your overall quality of life. You may:  Have trouble having conversations, especially in busy places with a lot of background noise.  Need to make changes at work or at school.  Feel depressed, isolated, or anxious. Sometimes hearing loss can make it more difficult to make friends, play sports, and have an active social life.  Struggle to hear the TV, radio, or sound at the movies.  Have trouble hearing the phone or doorbell.  Have trouble hearing alarms and other warning sounds. What changes can I make to protect myself from hearing loss? Have hearing tests (screenings) as often as directed by your health care provider. Hearing screenings can help detect hearing loss early and may help prevent hearing loss from getting worse. This may include having screenings done by:  An ear, nose, and throat specialist (otolaryngologist or ENT specialist).  A specialist in hearing problems (audiologist). Noise damage is the most preventable cause of hearing loss. Noise damage is caused by the loudness of the noise and how long you are exposed to it. Noises that can cause temporary or permanent nerve damage include noises from:  [employer] or chainsaws.  Guns (firearms).  Sirens.  [employer]. To avoid hearing loss from noise exposure:  Wear ear protection whenever you are exposed to loud noises or working in a noisy environment, such as: ? Using a lawn mower or leaf blower. ? Shooting firearms. ? Woodworking with machines. ? Being near jet engines or sirens.  If you are exposed to loud noise at your job, make sure that you are provided with the proper noise protection.  Do not sit close to speakers at concerts.  If you listen to music, keep the volume at a comfortable level.  If you wear headphones, make sure that the  noise is only loud enough for you to hear. If someone else can hear it, it is too loud. What can I do to cope with hearing loss?  Work with your health care providers to determine what types of treatment are best for you.  If you need a hearing aid, have it fitted by a specialist. Do not buy hearing aids without having a hearing aid evaluation first.  Use assistive hearing devices and warning devices or alarms that vibrate or use lights.  Face people who are talking to you and pay attention to their expressions as they speak. Ask people if they can speak more clearly or more slowly.  Tell friends and family about your hearing loss.  Avoid areas that have a lot of background noise.  If you are feeling isolated, anxious, or depressed, tell someone. Where to find support For more support:  Talk with your health care provider. Ask about hearing screenings and online or in-person support groups.  Find resources through the Hearing Loss Association of America: www.hearingloss.org  Get tips for living with hearing loss from the [place] for the Deaf and Hard of Hearing: www.agbell.org Where to find more information Find more information about how to prevent hearing loss from:  Centers for Disease Control and Prevention: http://www.wolf.info/  National Institute on Deafness and Other Communication Disorders: FightListings.se Contact a health care provider if you have:  Any change in your hearing.  Sudden hearing loss.  Other symptoms,  such as: ? Ear pain. ? Ear pressure. ? Tinnitus. ? Vertigo. Summary  Have your hearing screened to detect early hearing loss and to prevent further loss.  Hearing loss may be caused by damage to your hearing nerves, problems with sound getting to your inner ear, or both.  Avoiding loud noises is the best way to prevent hearing loss.  If your hearing changes suddenly, tell your health care provider right away. This information is  not intended to replace advice given to you by your health care provider. Make sure you discuss any questions you have with your health care provider. Document Revised: 01/03/2019 Document Reviewed: 05/30/2017 Elsevier Patient Education  [place].

## 2019-10-16 DIAGNOSIS — E46 Unspecified protein-calorie malnutrition: Secondary | ICD-10-CM | POA: Diagnosis not present

## 2019-10-16 DIAGNOSIS — G9340 Encephalopathy, unspecified: Secondary | ICD-10-CM | POA: Diagnosis not present

## 2019-10-16 DIAGNOSIS — M6281 Muscle weakness (generalized): Secondary | ICD-10-CM | POA: Diagnosis not present

## 2019-10-22 ENCOUNTER — Other Ambulatory Visit: Payer: Self-pay | Admitting: Family Medicine

## 2019-10-22 DIAGNOSIS — I10 Essential (primary) hypertension: Secondary | ICD-10-CM

## 2019-11-11 ENCOUNTER — Ambulatory Visit: Payer: Medicare Other | Admitting: Podiatry

## 2019-11-15 DIAGNOSIS — M6281 Muscle weakness (generalized): Secondary | ICD-10-CM | POA: Diagnosis not present

## 2019-11-15 DIAGNOSIS — E46 Unspecified protein-calorie malnutrition: Secondary | ICD-10-CM | POA: Diagnosis not present

## 2019-11-15 DIAGNOSIS — G9340 Encephalopathy, unspecified: Secondary | ICD-10-CM | POA: Diagnosis not present

## 2019-11-20 ENCOUNTER — Other Ambulatory Visit: Payer: Self-pay | Admitting: Family Medicine

## 2019-11-20 DIAGNOSIS — I10 Essential (primary) hypertension: Secondary | ICD-10-CM

## 2019-11-21 ENCOUNTER — Ambulatory Visit: Payer: Medicare Other | Admitting: Podiatry

## 2019-11-21 ENCOUNTER — Encounter: Payer: Self-pay | Admitting: Podiatry

## 2019-11-21 ENCOUNTER — Other Ambulatory Visit: Payer: Self-pay

## 2019-11-21 DIAGNOSIS — M2012 Hallux valgus (acquired), left foot: Secondary | ICD-10-CM | POA: Diagnosis not present

## 2019-11-21 DIAGNOSIS — L84 Corns and callosities: Secondary | ICD-10-CM

## 2019-11-21 DIAGNOSIS — E119 Type 2 diabetes mellitus without complications: Secondary | ICD-10-CM

## 2019-11-21 DIAGNOSIS — Q843 Anonychia: Secondary | ICD-10-CM

## 2019-11-21 DIAGNOSIS — M2011 Hallux valgus (acquired), right foot: Secondary | ICD-10-CM | POA: Diagnosis not present

## 2019-11-22 NOTE — Progress Notes (Signed)
Subjective:  Patient ID: Amber Lopez, female    DOB: 07-09-1940,  MRN: [id number]  79 y.o. female presents with for annual diabetic foot examination.  Patient was referred to our office by her primary care doctor, Dr. Grier Mitts.  Patient's daughter is present during the visit.  Patient is noted to have history of dementia.  Daughter relates most of her history on today's visit.  [name] has a 30-year history of diabetes.  There is no history of foot wounds noted.  Patient denies any numbness, tingling, burning sensations in her feet.  Daughter states patient had toenails removed from both feet many years ago.  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  She has moved here from [place] to be with her family.  Review of Systems: Negative except as noted in the HPI.  Past Medical History:  Diagnosis Date  . Arthritis   . Depression   . Diabetes mellitus without complication ([place])   . Eating disorder   . Hyperlipidemia   . Hypertension   . Migraines    Past Surgical History:  Procedure Laterality Date  . BREAST BIOPSY    . BREAST LUMPECTOMY Right    Years ago / Pt's daughter thinks it was cancer   Patient Active Problem List   Diagnosis Date Noted  . Healthcare maintenance 08/12/2019  . HIV disease ([place]) 03/03/2019  . History of breast cancer 03/01/2019  . Dementia with behavioral disturbance ([place]) 03/01/2019  . Arthritis 03/01/2019  . Essential hypertension 03/01/2019    Current Outpatient Medications:  .  anastrozole (ARIMIDEX) 1 MG tablet, Take 1 mg by mouth daily., Disp: , Rfl:  .  bictegravir-emtricitabine-tenofovir AF (BIKTARVY) 50-200-25 MG TABS tablet, Take 1 tablet by mouth daily., Disp: 30 tablet, Rfl: 5 .  hydrochlorothiazide (HYDRODIURIL) 25 MG tablet, Take 1 tablet (25 mg total) by mouth daily., Disp: 30 tablet, Rfl: 3 .  MELATONIN PO, Take 6 mg by mouth at bedtime., Disp: , Rfl:  .  metoprolol succinate (TOPROL-XL) 25 MG 24 hr tablet, Take 25 mg  by mouth daily., Disp: , Rfl:  .  potassium chloride SA (KLOR-CON) 20 MEQ tablet, TAKE 1 TABLET(20 MEQ) BY MOUTH DAILY, Disp: 30 tablet, Rfl: 3 Allergies  Allergen Reactions  . Other Rash and Swelling    rash wirh swelling   Social History   Tobacco Use  Smoking Status Never Smoker  Smokeless Tobacco Never Used   Objective:  There were no vitals filed for this visit. Constitutional Patient is a pleasant 79 y.o. African American female WD, WN in NAD.[name] Kitchen AAO x 3.  Vascular Capillary fill time to digits <3 seconds b/l lower extremities. Palpable pedal pulses b/l LE. Pedal hair sparse. Lower extremity skin temperature gradient within normal limits. No pain with calf compression b/l. No edema noted b/l lower extremities. No cyanosis or clubbing noted.  Neurologic Normal speech. Oriented to person, place, and time. Protective sensation intact 5/5 intact bilaterally with 10g monofilament b/l. Vibratory sensation diminished b/l. Deep tendon reflexes normal b/l.  Clonus negative b/l.  Dermatologic Pedal skin with normal turgor, texture and tone bilaterally. No open wounds bilaterally. No interdigital macerations bilaterally. Anonychia noted 1-5 bilaterally. Nailbed(s) epithelialized.  Hyperkeratotic lesion(s) L hallux and R hallux.  No erythema, no edema, no drainage, no flocculence.  Orthopedic: Normal muscle strength 5/5 to all lower extremity muscle groups bilaterally. No pain crepitus or joint limitation noted with ROM b/l. Hallux valgus with bunion deformity noted b/l lower extremities.  Assessment:   1. Anonychia   2. Callus   3. Hallux valgus, acquired, bilateral   4. Encounter for diabetic foot exam ([place])   5. Type 2 diabetes mellitus without complication, without long-term current use of insulin ([place])    Plan:  Patient was evaluated and treated and all questions answered.  -Examined patient. -Diabetic foot examination performed on today's visit. -Callus(es) L hallux and R hallux  pared utilizing sterile scalpel blade without complication or incident. Total number debrided =2. -Patient to report any pedal injuries to medical professional immediately. -Patient to continue soft, supportive shoe gear daily. -Patient/POA to call should there be question/concern in the interim.  Return in about 4 months (around 03/23/2020) for callus trim.  Marzetta Board, DPM

## 2019-11-26 ENCOUNTER — Telehealth: Payer: Self-pay | Admitting: Family Medicine

## 2019-11-26 NOTE — Telephone Encounter (Signed)
Pts daughter is calling in stating that the pt is eligible for home health through her insurance and wanted to know if the Dr. Volanda Napoleon would approve of it and what her recommendation is for a home health agency.  Pts daughter would like to have a call back to go in detail of what the pts insurance will cover for home health.

## 2019-11-28 NOTE — Telephone Encounter (Signed)
[place] for [facility] orders

## 2019-12-01 NOTE — Telephone Encounter (Signed)
Okay for [facility].

## 2019-12-02 NOTE — Telephone Encounter (Signed)
Attempted to call pt daughter, no option to leave a message, will try again

## 2019-12-03 ENCOUNTER — Ambulatory Visit (INDEPENDENT_AMBULATORY_CARE_PROVIDER_SITE_OTHER): Payer: Medicare Other | Admitting: Family Medicine

## 2019-12-03 ENCOUNTER — Encounter: Payer: Self-pay | Admitting: Family Medicine

## 2019-12-03 ENCOUNTER — Other Ambulatory Visit: Payer: Self-pay

## 2019-12-03 VITALS — BP 150/80 | HR 108 | Temp 98.4°F | Wt 125.0 lb

## 2019-12-03 DIAGNOSIS — R0602 Shortness of breath: Secondary | ICD-10-CM | POA: Diagnosis not present

## 2019-12-03 DIAGNOSIS — W06XXXA Fall from bed, initial encounter: Secondary | ICD-10-CM | POA: Diagnosis not present

## 2019-12-03 DIAGNOSIS — R9431 Abnormal electrocardiogram [ECG] [EKG]: Secondary | ICD-10-CM

## 2019-12-03 NOTE — Progress Notes (Signed)
Subjective:    Patient ID: Amber Lopez, female    DOB: 1940-05-16, 79 y.o.   MRN: [id number]  No chief complaint on file.   HPI Pt is a 79 yo female with pmh sig for HTN, dementia, HIV, h/o breast ca who was seen for ongoing concern.  Pt fell out of bed one night 1 month ago. Pt unsure if she hit anything.  Pt's daughter states pt has been complaining about pain of L side for the last few wks. Pt denies coughing, bruising, edema, fever.  Pt also with decreased appetite and occasional SOB.  Tried Tylenol 500 mg.  Since last OFV, pt say Dr. Adah Perl, podiatry.  Past Medical History:  Diagnosis Date  . Arthritis   . Depression   . Diabetes mellitus without complication ([place])   . Eating disorder   . Hyperlipidemia   . Hypertension   . Migraines     Allergies  Allergen Reactions  . Other Rash and Swelling    rash wirh swelling    Review of Systems  Unable to perform ROS: Dementia       Objective:    Blood pressure (!) 150/80, pulse (!) 108, temperature 98.4 F (36.9 C), temperature source Oral, weight 125 lb (56.7 kg), SpO2 98 %. 6 MWT normal.  Gen. Pleasant, well-nourished, in no distress, normal affect   HEENT: [place]/AT, face symmetric, conjunctiva clear, no scleral icterus, PERRLA, EOMI, nares patent without drainage Lungs: no accessory muscle use, CTAB, no wheezes or rales Cardiovascular: tachycardia, no m/r/g, no peripheral edema Abdomen: BS present, soft, NT/ND, no hepatosplenomegaly. Musculoskeletal: No TTP of L flank, ribs or back.  No deformities, no cyanosis or clubbing, normal tone Neuro:  A&Ox1 (person), CN II-XII intact, normal gait Skin:  Warm, no lesions/ rash   Wt Readings from Last 3 Encounters:  12/03/19 125 lb (56.7 kg)  09/29/19 136 lb (61.7 kg)  08/25/19 137 lb (62.1 kg)    Lab Results  Component Value Date   WBC 4.5 03/13/2019   HGB 11.5 (L) 03/13/2019   HCT 35.1 03/13/2019   PLT 242 03/13/2019   GLUCOSE 237 (H) 08/11/2019   CHOL 282 (H)  03/13/2019   TRIG 128 03/13/2019   HDL 73 03/13/2019   LDLCALC 183 (H) 03/13/2019   ALT 7 08/11/2019   AST 14 08/11/2019   NA 138 08/11/2019   K 4.0 08/11/2019   CL 100 08/11/2019   CREATININE 1.26 (H) 08/11/2019   BUN 22 08/11/2019   CO2 26 08/11/2019    Assessment/Plan:  SOB (shortness of breath)  -stable in office -consider possible causes including pneumonia 2/2 rib fx or injury from fall, diastolic dysfunction, reactive airway dz. -6 MWT performed and normal as pO2 remained >90%  -EKG sinus rhythm with tachycardia (VR 100), ST depression, T wave inversion, and flattened Twaves in multiple leads.  Cannot r/o ongoing  Ischemia. Consider LVH.  No prior study for comparison. -given precautions - Plan: EKG 12-Lead, Ambulatory referral to Cardiology, DG Chest 2 view  Fall from bed, initial encounter -discussed supportive care and fall prevention  EKG abnormality  - Plan: Ambulatory referral to Cardiology  F/u prn  Grier Mitts, MD

## 2019-12-03 NOTE — Patient Instructions (Signed)
Shortness of Breath, Adult Shortness of breath is when a person has trouble breathing enough air or when a person feels like she or he is having trouble breathing in enough air. Shortness of breath could be a sign of a medical problem. Follow these instructions at home:   Pay attention to any changes in your symptoms.  Do not use any products that contain nicotine or tobacco, such as cigarettes, e-cigarettes, and chewing tobacco.  Do not smoke. Smoking is a common cause of shortness of breath. If you need help quitting, ask your health care provider.  Avoid things that can irritate your airways, such as: ? Mold. ? Dust. ? Air pollution. ? Chemical fumes. ? Things that can cause allergy symptoms (allergens), if you have allergies.  Keep your living space clean and free of mold and dust.  Rest as needed. Slowly return to your usual activities.  Take over-the-counter and prescription medicines only as told by your health care provider. This includes oxygen therapy and inhaled medicines.  Keep all follow-up visits as told by your health care provider. This is important. Contact a health care provider if:  Your condition does not improve as soon as expected.  You have a hard time doing your normal activities, even after you rest.  You have new symptoms. Get help right away if:  Your shortness of breath gets worse.  You have shortness of breath when you are resting.  You feel light-headed or you faint.  You have a cough that is not controlled with medicines.  You cough up blood.  You have pain with breathing.  You have pain in your chest, arms, shoulders, or abdomen.  You have a fever.  You cannot walk up stairs or exercise the way that you normally do. These symptoms may represent a serious problem that is an emergency. Do not wait to see if the symptoms will go away. Get medical help right away. Call your local emergency services (911 in the [place] Do not drive yourself  to the hospital. Summary  Shortness of breath is when a person has trouble breathing enough air. It can be a sign of a medical problem.  Avoid things that irritate your lungs, such as smoking, pollution, mold, and dust.  Pay attention to changes in your symptoms and contact your health care provider if you have a hard time completing daily activities because of shortness of breath. This information is not intended to replace advice given to you by your health care provider. Make sure you discuss any questions you have with your health care provider. Document Revised: 09/10/2017 Document Reviewed: 09/10/2017 Elsevier Patient Education  [place] Prevention in the Home, Adult Falls can cause injuries. They can happen to people of all ages. There are many things you can do to make your home safe and to help prevent falls. Ask for help when making these changes, if needed. What actions can I take to prevent falls? General Instructions  Use good lighting in all rooms. Replace any light bulbs that burn out.  Turn on the lights when you go into a dark area. Use night-lights.  Keep items that you use often in easy-to-reach places. Lower the shelves around your home if necessary.  Set up your furniture so you have a clear path. Avoid moving your furniture around.  Do not have throw rugs and other things on the floor that can make you trip.  Avoid walking on wet floors.  If any of  your floors are uneven, fix them.  Add color or contrast paint or tape to clearly mark and help you see: ? Any grab bars or handrails. ? First and last steps of stairways. ? Where the edge of each step is.  If you use a stepladder: ? Make sure that it is fully opened. Do not climb a closed stepladder. ? Make sure that both sides of the stepladder are locked into place. ? Ask someone to hold the stepladder for you while you use it.  If there are any pets around you, be aware of where they  are. What can I do in the bathroom?      Keep the floor dry. Clean up any water that spills onto the floor as soon as it happens.  Remove soap buildup in the tub or shower regularly.  Use non-skid mats or decals on the floor of the tub or shower.  Attach bath mats securely with double-sided, non-slip rug tape.  If you need to sit down in the shower, use a plastic, non-slip stool.  Install grab bars by the toilet and in the tub and shower. Do not use towel bars as grab bars. What can I do in the bedroom?  Make sure that you have a light by your bed that is easy to reach.  Do not use any sheets or blankets that are too big for your bed. They should not hang down onto the floor.  Have a firm chair that has side arms. You can use this for support while you get dressed. What can I do in the kitchen?  Clean up any spills right away.  If you need to reach something above you, use a strong step stool that has a grab bar.  Keep electrical cords out of the way.  Do not use floor polish or wax that makes floors slippery. If you must use wax, use non-skid floor wax. What can I do with my stairs?  Do not leave any items on the stairs.  Make sure that you have a light switch at the top of the stairs and the bottom of the stairs. If you do not have them, ask someone to add them for you.  Make sure that there are handrails on both sides of the stairs, and use them. Fix handrails that are broken or loose. Make sure that handrails are as long as the stairways.  Install non-slip stair treads on all stairs in your home.  Avoid having throw rugs at the top or bottom of the stairs. If you do have throw rugs, attach them to the floor with carpet tape.  Choose a carpet that does not hide the edge of the steps on the stairway.  Check any carpeting to make sure that it is firmly attached to the stairs. Fix any carpet that is loose or worn. What can I do on the outside of my home?  Use bright  outdoor lighting.  Regularly fix the edges of walkways and driveways and fix any cracks.  Remove anything that might make you trip as you walk through a door, such as a raised step or threshold.  Trim any bushes or trees on the path to your home.  Regularly check to see if handrails are loose or broken. Make sure that both sides of any steps have handrails.  Install guardrails along the edges of any raised decks and porches.  Clear walking paths of anything that might make someone trip, such as tools or  rocks.  Have any leaves, snow, or ice cleared regularly.  Use sand or salt on walking paths during winter.  Clean up any spills in your garage right away. This includes grease or oil spills. What other actions can I take?  Wear shoes that: ? Have a low heel. Do not wear high heels. ? Have rubber bottoms. ? Are comfortable and fit you well. ? Are closed at the toe. Do not wear open-toe sandals.  Use tools that help you move around (mobility aids) if they are needed. These include: ? Canes. ? Walkers. ? Scooters. ? Crutches.  Review your medicines with your doctor. Some medicines can make you feel dizzy. This can increase your chance of falling. Ask your doctor what other things you can do to help prevent falls. Where to find more information  Centers for Disease Control and Prevention, STEADI: https://garcia.biz/  [employer] on Aging: BrainJudge.co.uk Contact a doctor if:  You are afraid of falling at home.  You feel weak, drowsy, or dizzy at home.  You fall at home. Summary  There are many simple things that you can do to make your home safe and to help prevent falls.  Ways to make your home safe include removing tripping hazards and installing grab bars in the bathroom.  Ask for help when making these changes in your home. This information is not intended to replace advice given to you by your health care provider. Make sure you discuss any  questions you have with your health care provider. Document Revised: 08/01/2018 Document Reviewed: 11/23/2016 Elsevier Patient Education  2020 [employer].

## 2019-12-09 ENCOUNTER — Encounter: Payer: Self-pay | Admitting: Family Medicine

## 2019-12-10 ENCOUNTER — Ambulatory Visit: Payer: Medicare Other | Admitting: Family

## 2019-12-11 ENCOUNTER — Other Ambulatory Visit: Payer: Self-pay

## 2019-12-11 NOTE — ED Notes (Signed)
No answer x1

## 2019-12-12 DIAGNOSIS — S20212A Contusion of left front wall of thorax, initial encounter: Secondary | ICD-10-CM | POA: Diagnosis not present

## 2019-12-12 DIAGNOSIS — R109 Unspecified abdominal pain: Secondary | ICD-10-CM | POA: Diagnosis not present

## 2019-12-15 ENCOUNTER — Telehealth: Payer: Self-pay | Admitting: Family Medicine

## 2019-12-15 NOTE — Telephone Encounter (Signed)
[name] Desmuke (pt's daughter-NOT on DPR) stated she would like to speak to [place] regarding RMI testing and that it is an emergency. Informed Chrisel that due to her not being on the DPR we are unable to speak to her about the pt.    [name] still asked for a call back at [phone] and stated she will have her mother with her

## 2019-12-16 ENCOUNTER — Other Ambulatory Visit: Payer: Self-pay

## 2019-12-16 DIAGNOSIS — W06XXXA Fall from bed, initial encounter: Secondary | ICD-10-CM

## 2019-12-16 DIAGNOSIS — G9340 Encephalopathy, unspecified: Secondary | ICD-10-CM | POA: Diagnosis not present

## 2019-12-16 DIAGNOSIS — F0391 Unspecified dementia with behavioral disturbance: Secondary | ICD-10-CM

## 2019-12-16 DIAGNOSIS — M6281 Muscle weakness (generalized): Secondary | ICD-10-CM | POA: Diagnosis not present

## 2019-12-16 DIAGNOSIS — E46 Unspecified protein-calorie malnutrition: Secondary | ICD-10-CM | POA: Diagnosis not present

## 2019-12-16 NOTE — Telephone Encounter (Signed)
Pt requests for abdominal US and x-ray for her back. State that she is in a lot of pain. Please advise

## 2019-12-16 NOTE — Telephone Encounter (Signed)
Home Health referral placed per pt request

## 2019-12-17 ENCOUNTER — Other Ambulatory Visit: Payer: Self-pay

## 2019-12-17 DIAGNOSIS — F0391 Unspecified dementia with behavioral disturbance: Secondary | ICD-10-CM

## 2019-12-17 DIAGNOSIS — R0602 Shortness of breath: Secondary | ICD-10-CM

## 2019-12-17 DIAGNOSIS — W06XXXA Fall from bed, initial encounter: Secondary | ICD-10-CM

## 2019-12-19 DIAGNOSIS — Z853 Personal history of malignant neoplasm of breast: Secondary | ICD-10-CM | POA: Diagnosis not present

## 2019-12-19 DIAGNOSIS — Z9181 History of falling: Secondary | ICD-10-CM | POA: Diagnosis not present

## 2019-12-19 DIAGNOSIS — Z792 Long term (current) use of antibiotics: Secondary | ICD-10-CM | POA: Diagnosis not present

## 2019-12-19 DIAGNOSIS — E785 Hyperlipidemia, unspecified: Secondary | ICD-10-CM | POA: Diagnosis not present

## 2019-12-19 DIAGNOSIS — E119 Type 2 diabetes mellitus without complications: Secondary | ICD-10-CM | POA: Diagnosis not present

## 2019-12-19 DIAGNOSIS — W06XXXD Fall from bed, subsequent encounter: Secondary | ICD-10-CM | POA: Diagnosis not present

## 2019-12-19 DIAGNOSIS — I10 Essential (primary) hypertension: Secondary | ICD-10-CM | POA: Diagnosis not present

## 2019-12-19 DIAGNOSIS — M1990 Unspecified osteoarthritis, unspecified site: Secondary | ICD-10-CM | POA: Diagnosis not present

## 2019-12-19 DIAGNOSIS — S20212D Contusion of left front wall of thorax, subsequent encounter: Secondary | ICD-10-CM | POA: Diagnosis not present

## 2019-12-22 ENCOUNTER — Telehealth: Payer: Self-pay | Admitting: *Deleted

## 2019-12-22 NOTE — Telephone Encounter (Signed)
Amber Lopez calling [employer]. Wants to leave a message for office. Needs plan of care approval for 1 time a week for 1 week And 2x a week for 4 weeks. Patient has a drug interaction on 2 medications.

## 2019-12-25 DIAGNOSIS — E785 Hyperlipidemia, unspecified: Secondary | ICD-10-CM | POA: Diagnosis not present

## 2019-12-25 DIAGNOSIS — Z853 Personal history of malignant neoplasm of breast: Secondary | ICD-10-CM | POA: Diagnosis not present

## 2019-12-25 DIAGNOSIS — W06XXXD Fall from bed, subsequent encounter: Secondary | ICD-10-CM | POA: Diagnosis not present

## 2019-12-25 DIAGNOSIS — M1990 Unspecified osteoarthritis, unspecified site: Secondary | ICD-10-CM | POA: Diagnosis not present

## 2019-12-25 DIAGNOSIS — Z792 Long term (current) use of antibiotics: Secondary | ICD-10-CM | POA: Diagnosis not present

## 2019-12-25 DIAGNOSIS — Z9181 History of falling: Secondary | ICD-10-CM | POA: Diagnosis not present

## 2019-12-25 DIAGNOSIS — I10 Essential (primary) hypertension: Secondary | ICD-10-CM | POA: Diagnosis not present

## 2019-12-25 DIAGNOSIS — E119 Type 2 diabetes mellitus without complications: Secondary | ICD-10-CM | POA: Diagnosis not present

## 2019-12-25 DIAGNOSIS — S20212D Contusion of left front wall of thorax, subsequent encounter: Secondary | ICD-10-CM | POA: Diagnosis not present

## 2019-12-25 NOTE — Telephone Encounter (Signed)
Okay for VO.

## 2019-12-25 NOTE — Telephone Encounter (Signed)
An x-ray was previously ordered and not done at time of last office visit.

## 2019-12-26 DIAGNOSIS — Z9181 History of falling: Secondary | ICD-10-CM | POA: Diagnosis not present

## 2019-12-26 DIAGNOSIS — W06XXXD Fall from bed, subsequent encounter: Secondary | ICD-10-CM | POA: Diagnosis not present

## 2019-12-26 DIAGNOSIS — I10 Essential (primary) hypertension: Secondary | ICD-10-CM | POA: Diagnosis not present

## 2019-12-26 DIAGNOSIS — E119 Type 2 diabetes mellitus without complications: Secondary | ICD-10-CM | POA: Diagnosis not present

## 2019-12-26 DIAGNOSIS — Z853 Personal history of malignant neoplasm of breast: Secondary | ICD-10-CM | POA: Diagnosis not present

## 2019-12-26 DIAGNOSIS — E785 Hyperlipidemia, unspecified: Secondary | ICD-10-CM | POA: Diagnosis not present

## 2019-12-26 DIAGNOSIS — M1990 Unspecified osteoarthritis, unspecified site: Secondary | ICD-10-CM | POA: Diagnosis not present

## 2019-12-26 DIAGNOSIS — Z792 Long term (current) use of antibiotics: Secondary | ICD-10-CM | POA: Diagnosis not present

## 2019-12-26 DIAGNOSIS — S20212D Contusion of left front wall of thorax, subsequent encounter: Secondary | ICD-10-CM | POA: Diagnosis not present

## 2019-12-26 NOTE — Telephone Encounter (Signed)
Left a detailed message for Patrici Ranks with [facility] regarding pt approved verbal orders for Home health

## 2019-12-28 ENCOUNTER — Other Ambulatory Visit: Payer: Self-pay | Admitting: Family Medicine

## 2019-12-28 DIAGNOSIS — I10 Essential (primary) hypertension: Secondary | ICD-10-CM

## 2019-12-30 ENCOUNTER — Telehealth: Payer: Self-pay | Admitting: Family Medicine

## 2019-12-30 NOTE — Telephone Encounter (Signed)
Pt daughter called to see if the CT scan has been ordered yet.   Please call her [phone]  Needs a refill on her blood pressure medication

## 2019-12-31 ENCOUNTER — Other Ambulatory Visit: Payer: Self-pay

## 2019-12-31 DIAGNOSIS — I10 Essential (primary) hypertension: Secondary | ICD-10-CM

## 2019-12-31 MED ORDER — POTASSIUM CHLORIDE CRYS ER 20 MEQ PO TBCR: EXTENDED_RELEASE_TABLET | ORAL | 3 refills | Status: DC

## 2019-12-31 MED ORDER — METOPROLOL SUCCINATE ER 25 MG PO TB24: 25.0000 mg | ORAL_TABLET | Freq: Every day | ORAL | 0 refills | Status: DC

## 2020-01-01 DIAGNOSIS — I10 Essential (primary) hypertension: Secondary | ICD-10-CM | POA: Diagnosis not present

## 2020-01-01 DIAGNOSIS — Z9181 History of falling: Secondary | ICD-10-CM | POA: Diagnosis not present

## 2020-01-01 DIAGNOSIS — S20212D Contusion of left front wall of thorax, subsequent encounter: Secondary | ICD-10-CM | POA: Diagnosis not present

## 2020-01-01 DIAGNOSIS — W06XXXD Fall from bed, subsequent encounter: Secondary | ICD-10-CM | POA: Diagnosis not present

## 2020-01-01 DIAGNOSIS — Z792 Long term (current) use of antibiotics: Secondary | ICD-10-CM | POA: Diagnosis not present

## 2020-01-01 DIAGNOSIS — M1990 Unspecified osteoarthritis, unspecified site: Secondary | ICD-10-CM | POA: Diagnosis not present

## 2020-01-01 DIAGNOSIS — E785 Hyperlipidemia, unspecified: Secondary | ICD-10-CM | POA: Diagnosis not present

## 2020-01-01 DIAGNOSIS — Z853 Personal history of malignant neoplasm of breast: Secondary | ICD-10-CM | POA: Diagnosis not present

## 2020-01-01 DIAGNOSIS — E119 Type 2 diabetes mellitus without complications: Secondary | ICD-10-CM | POA: Diagnosis not present

## 2020-01-01 NOTE — Telephone Encounter (Signed)
No order for Abdominal CT  on pt chart, ok to place one. Pt BP medications already sent to pt pharmacy for refill.

## 2020-01-01 NOTE — Telephone Encounter (Signed)
Spoke with pt daughter aware that order for x-ray of her back was ordered and pt needs to go for imaging at the [place] location

## 2020-01-02 NOTE — Telephone Encounter (Signed)
Pt.notified

## 2020-01-02 NOTE — Telephone Encounter (Signed)
Please refer to my response in telephone message from 12/15/19.

## 2020-01-02 NOTE — Telephone Encounter (Signed)
Left detailed message for pt regarding going to get the Chest x-ray at the [facility] location, advised to call the office with any questions

## 2020-01-07 ENCOUNTER — Telehealth: Payer: Self-pay | Admitting: Family Medicine

## 2020-01-07 DIAGNOSIS — H903 Sensorineural hearing loss, bilateral: Secondary | ICD-10-CM | POA: Diagnosis not present

## 2020-01-07 NOTE — Telephone Encounter (Signed)
Pt needs forms filled out. She would like it faxed to [phone] ATTN: Member # [id number] and a call for pick up at [phone] when completed.   Placed in red fodler

## 2020-01-08 NOTE — Telephone Encounter (Signed)
Spoke with pt daughter advised that pt has an order for x-ray that was placed back a few months ago. Advised to take pt to [facility] for Imaging, verbalized understanding

## 2020-01-08 NOTE — Telephone Encounter (Signed)
Spoke with pt aware that Dr Volanda Napoleon needs a new form to complete pt medication program form snce the form dropped off at the office was addressed to pt previous Dr Dwyane Dee at [place]. Pt state that I fax the form to Dr Dwyane Dee. Not able to obtain the fax number to Dr Dwyane Dee office . Will try call the office again

## 2020-01-14 DIAGNOSIS — Z792 Long term (current) use of antibiotics: Secondary | ICD-10-CM | POA: Diagnosis not present

## 2020-01-14 DIAGNOSIS — W06XXXD Fall from bed, subsequent encounter: Secondary | ICD-10-CM | POA: Diagnosis not present

## 2020-01-14 DIAGNOSIS — Z9181 History of falling: Secondary | ICD-10-CM | POA: Diagnosis not present

## 2020-01-14 DIAGNOSIS — I10 Essential (primary) hypertension: Secondary | ICD-10-CM | POA: Diagnosis not present

## 2020-01-14 DIAGNOSIS — Z853 Personal history of malignant neoplasm of breast: Secondary | ICD-10-CM | POA: Diagnosis not present

## 2020-01-14 DIAGNOSIS — S20212D Contusion of left front wall of thorax, subsequent encounter: Secondary | ICD-10-CM | POA: Diagnosis not present

## 2020-01-14 DIAGNOSIS — E785 Hyperlipidemia, unspecified: Secondary | ICD-10-CM | POA: Diagnosis not present

## 2020-01-14 DIAGNOSIS — E119 Type 2 diabetes mellitus without complications: Secondary | ICD-10-CM | POA: Diagnosis not present

## 2020-01-14 DIAGNOSIS — M1990 Unspecified osteoarthritis, unspecified site: Secondary | ICD-10-CM | POA: Diagnosis not present

## 2020-01-15 ENCOUNTER — Other Ambulatory Visit: Payer: Self-pay

## 2020-01-15 ENCOUNTER — Ambulatory Visit (INDEPENDENT_AMBULATORY_CARE_PROVIDER_SITE_OTHER)
Admission: RE | Admit: 2020-01-15 | Discharge: 2020-01-15 | Disposition: A | Discharge status: Home / Self Care | Payer: Medicare Other | Source: Ambulatory Visit | Attending: Family Medicine | Admitting: Family Medicine

## 2020-01-15 DIAGNOSIS — R109 Unspecified abdominal pain: Secondary | ICD-10-CM | POA: Diagnosis not present

## 2020-01-15 DIAGNOSIS — J9811 Atelectasis: Secondary | ICD-10-CM | POA: Diagnosis not present

## 2020-01-15 DIAGNOSIS — R0602 Shortness of breath: Secondary | ICD-10-CM

## 2020-01-15 DIAGNOSIS — J90 Pleural effusion, not elsewhere classified: Secondary | ICD-10-CM | POA: Diagnosis not present

## 2020-01-16 DIAGNOSIS — Z853 Personal history of malignant neoplasm of breast: Secondary | ICD-10-CM | POA: Diagnosis not present

## 2020-01-16 DIAGNOSIS — S20212D Contusion of left front wall of thorax, subsequent encounter: Secondary | ICD-10-CM | POA: Diagnosis not present

## 2020-01-16 DIAGNOSIS — Z9181 History of falling: Secondary | ICD-10-CM | POA: Diagnosis not present

## 2020-01-16 DIAGNOSIS — M1990 Unspecified osteoarthritis, unspecified site: Secondary | ICD-10-CM | POA: Diagnosis not present

## 2020-01-16 DIAGNOSIS — E785 Hyperlipidemia, unspecified: Secondary | ICD-10-CM | POA: Diagnosis not present

## 2020-01-16 DIAGNOSIS — Z792 Long term (current) use of antibiotics: Secondary | ICD-10-CM | POA: Diagnosis not present

## 2020-01-16 DIAGNOSIS — I10 Essential (primary) hypertension: Secondary | ICD-10-CM | POA: Diagnosis not present

## 2020-01-16 DIAGNOSIS — W06XXXD Fall from bed, subsequent encounter: Secondary | ICD-10-CM | POA: Diagnosis not present

## 2020-01-16 DIAGNOSIS — E119 Type 2 diabetes mellitus without complications: Secondary | ICD-10-CM | POA: Diagnosis not present

## 2020-01-19 ENCOUNTER — Ambulatory Visit: Payer: Medicare Other | Admitting: Cardiology

## 2020-01-20 ENCOUNTER — Other Ambulatory Visit: Payer: Self-pay | Admitting: Family Medicine

## 2020-01-20 DIAGNOSIS — J189 Pneumonia, unspecified organism: Secondary | ICD-10-CM

## 2020-01-20 MED ORDER — AZITHROMYCIN 250 MG PO TABS: ORAL_TABLET | ORAL | 0 refills | Status: DC

## 2020-01-20 MED ORDER — AMOXICILLIN-POT CLAVULANATE ER 1000-62.5 MG PO TB12: 2.0000 | ORAL_TABLET | Freq: Two times a day (BID) | ORAL | 0 refills | Status: DC

## 2020-01-20 NOTE — Telephone Encounter (Signed)
Pt daughter state that she will call Dr Dwyane Dee office and have pt forms changed to Dr Volanda Napoleon name so she call complete the form, awaiting for the forms

## 2020-01-20 NOTE — Progress Notes (Signed)
CXR with large L pleural effusion.  Will treat with Augmentin and macrolide.  Ground glass appearance not appreciated on imaging so will not treat as possible PCP.    Grier Mitts, MD

## 2020-01-26 ENCOUNTER — Ambulatory Visit (INDEPENDENT_AMBULATORY_CARE_PROVIDER_SITE_OTHER): Payer: Medicare Other | Admitting: Family Medicine

## 2020-01-26 ENCOUNTER — Encounter: Payer: Self-pay | Admitting: Family Medicine

## 2020-01-26 ENCOUNTER — Other Ambulatory Visit: Payer: Self-pay | Admitting: Family

## 2020-01-26 ENCOUNTER — Other Ambulatory Visit: Payer: Self-pay

## 2020-01-26 VITALS — BP 126/88 | HR 110 | Temp 99.1°F | Wt 112.0 lb

## 2020-01-26 DIAGNOSIS — J189 Pneumonia, unspecified organism: Secondary | ICD-10-CM | POA: Diagnosis not present

## 2020-01-26 DIAGNOSIS — I10 Essential (primary) hypertension: Secondary | ICD-10-CM

## 2020-01-26 DIAGNOSIS — R634 Abnormal weight loss: Secondary | ICD-10-CM | POA: Diagnosis not present

## 2020-01-26 DIAGNOSIS — F0391 Unspecified dementia with behavioral disturbance: Secondary | ICD-10-CM

## 2020-01-26 DIAGNOSIS — B20 Human immunodeficiency virus [HIV] disease: Secondary | ICD-10-CM

## 2020-01-26 MED ORDER — HYDROCHLOROTHIAZIDE 25 MG PO TABS: ORAL_TABLET | ORAL | 1 refills | Status: DC

## 2020-01-26 MED ORDER — AMOXICILLIN-POT CLAVULANATE 875-125 MG PO TABS: 1.0000 | ORAL_TABLET | Freq: Two times a day (BID) | ORAL | 0 refills | Status: DC

## 2020-01-26 MED ORDER — POTASSIUM CHLORIDE CRYS ER 20 MEQ PO TBCR: EXTENDED_RELEASE_TABLET | ORAL | 1 refills | Status: DC

## 2020-01-26 NOTE — Patient Instructions (Addendum)
Community-Acquired Pneumonia, Adult Pneumonia is an infection of the lungs. It causes swelling in the airways of the lungs. Mucus and fluid may also build up inside the airways. One type of pneumonia can happen while a person is in a hospital. A different type can happen when a person is not in a hospital (community-acquired pneumonia).  What are the causes?  This condition is caused by germs (viruses, bacteria, or fungi). Some types of germs can be passed from one person to another. This can happen when you breathe in droplets from the cough or sneeze of an infected person. What increases the risk? You are more likely to develop this condition if you:  Have a long-term (chronic) disease, such as: ? Chronic obstructive pulmonary disease (COPD). ? Asthma. ? Cystic fibrosis. ? Congestive heart failure. ? Diabetes. ? Kidney disease.  Have HIV.  Have sickle cell disease.  Have had your spleen removed.  Do not take good care of your teeth and mouth (poor dental hygiene).  Have a medical condition that increases the risk of breathing in droplets from your own mouth and nose.  Have a weakened body defense system (immune system).  Are a smoker.  Travel to areas where the germs that cause this illness are common.  Are around certain animals or the places they live. What are the signs or symptoms?  A dry cough.  A wet (productive) cough.  Fever.  Sweating.  Chest pain. This often happens when breathing deeply or coughing.  Fast breathing or trouble breathing.  Shortness of breath.  Shaking chills.  Feeling tired (fatigue).  Muscle aches. How is this treated? Treatment for this condition depends on many things. Most adults can be treated at home. In some cases, treatment must happen in a hospital. Treatment may include:  Medicines given by mouth or through an IV tube.  Being given extra oxygen.  Respiratory therapy. In rare cases, treatment for very bad pneumonia  may include:  Using a machine to help you breathe.  Having a procedure to remove fluid from around your lungs. Follow these instructions at home: Medicines  Take over-the-counter and prescription medicines only as told by your doctor. ? Only take cough medicine if you are losing sleep.  If you were prescribed an antibiotic medicine, take it as told by your doctor. Do not stop taking the antibiotic even if you start to feel better. General instructions   Sleep with your head and neck raised (elevated). You can do this by sleeping in a recliner or by putting a few pillows under your head.  Rest as needed. Get at least 8 hours of sleep each night.  Drink enough water to keep your pee (urine) pale yellow.  Eat a healthy diet that includes plenty of vegetables, fruits, whole grains, low-fat dairy products, and lean protein.  Do not use any products that contain nicotine or tobacco. These include cigarettes, e-cigarettes, and chewing tobacco. If you need help quitting, ask your doctor.  Keep all follow-up visits as told by your doctor. This is important. How is this prevented? A shot (vaccine) can help prevent pneumonia. Shots are often suggested for:  People older than 79 years of age.  People older than 79 years of age who: ? Are having cancer treatment. ? Have long-term (chronic) lung disease. ? Have problems with their body's defense system. You may also prevent pneumonia if you take these actions:  Get the flu (influenza) shot every year.  Go to the dentist as  often as told.  Wash your hands often. If you cannot use soap and water, use hand sanitizer. Contact a doctor if:  You have a fever.  You lose sleep because your cough medicine does not help. Get help right away if:  You are short of breath and it gets worse.  You have more chest pain.  Your sickness gets worse. This is very serious if: ? You are an older adult. ? Your body's defense system is weak.  You  cough up blood. Summary  Pneumonia is an infection of the lungs.  Most adults can be treated at home. Some will need treatment in a hospital.  Drink enough water to keep your pee pale yellow.  Get at least 8 hours of sleep each night. This information is not intended to replace advice given to you by your health care provider. Make sure you discuss any questions you have with your health care provider. Document Revised: 07/31/2018 Document Reviewed: 12/06/2017 Elsevier Patient Education  [place].  High-Protein and High-Calorie Diet Eating high-protein and high-calorie foods can help you to gain weight, heal after an injury, and recover after an illness or surgery. The specific amount of daily protein and calories you need depends on:  Your body weight.  The reason this diet is recommended for you. What is my plan? Generally, a high-protein, high-calorie diet involves:  Eating 250-500 extra calories each day.  Making sure that you get enough of your daily calories from protein. Ask your health care provider how many of your calories should come from protein. Talk with a health care provider, such as a diet and nutrition specialist (dietitian), about how much protein and how many calories you need each day. Follow the diet as directed by your health care provider. What are tips for following this plan?  Preparing meals  Add whole milk, half-and-half, or heavy cream to cereal, pudding, soup, or hot cocoa.  Add whole milk to instant breakfast drinks.  Add peanut butter to oatmeal or smoothies.  Add powdered milk to baked goods, smoothies, or milkshakes.  Add powdered milk, cream, or butter to mashed potatoes.  Add cheese to cooked vegetables.  Make whole-milk yogurt parfaits. Top them with granola, fruit, or nuts.  Add cottage cheese to your fruit.  Add avocado, cheese, or both to sandwiches or salads.  Add meat, poultry, or seafood to rice, pasta, casseroles,  salads, and soups.  Use mayonnaise when making egg salad, chicken salad, or tuna salad.  Use peanut butter as a dip for vegetables or as a topping for pretzels, celery, or crackers.  Add beans to casseroles, dips, and spreads.  Add pureed beans to sauces and soups.  Replace calorie-free drinks with calorie-containing drinks, such as milk and fruit juice.  Replace water with milk or heavy cream when making foods such as oatmeal, pudding, or cocoa. General instructions  Ask your health care provider if you should take a nutritional supplement.  Try to eat six small meals each day instead of three large meals.  Eat a balanced diet. In each meal, include one food that is high in protein.  Keep nutritious snacks available, such as nuts, trail mixes, dried fruit, and yogurt.  If you have kidney disease or diabetes, talk with your health care provider about how much protein is safe for you. Too much protein may put extra stress on your kidneys.  Drink your calories. Choose high-calorie drinks and have them after your meals. What high-protein foods should  I eat?  Vegetables Soybeans. Peas. Grains Quinoa. Bulgur wheat. Meats and other proteins Beef, pork, and poultry. Fish and seafood. Eggs. Tofu. Textured vegetable protein (TVP). Peanut butter. Nuts and seeds. Dried beans. Protein powders. Dairy Whole milk. Whole-milk yogurt. Powdered milk. Cheese. Yahoo. Eggnog. Beverages High-protein supplement drinks. Soy milk. Other foods Protein bars. The items listed above may not be a complete list of high-protein foods and beverages. Contact a dietitian for more options. What high-calorie foods should I eat? Fruits Dried fruit. Fruit leather. Canned fruit in syrup. Fruit juice. Avocado. Vegetables Vegetables cooked in oil or butter. Fried potatoes. Grains Pasta. Quick breads. Muffins. Pancakes. Ready-to-eat cereal. Meats and other proteins Peanut butter. Nuts and  seeds. Dairy Heavy cream. Whipped cream. Cream cheese. Sour cream. Ice cream. Custard. Pudding. Beverages Meal-replacement beverages. Nutrition shakes. Fruit juice. Sugar-sweetened soft drinks. Seasonings and condiments Salad dressing. Mayonnaise. Alfredo sauce. Fruit preserves or jelly. Honey. Syrup. Sweets and desserts Cake. Cookies. Pie. Pastries. Candy bars. Chocolate. Fats and oils Butter or margarine. Oil. Gravy. Other foods Meal-replacement bars. The items listed above may not be a complete list of high-calorie foods and beverages. Contact a dietitian for more options. Summary  A high-protein, high-calorie diet can help you gain weight or heal faster after an injury, illness, or surgery.  To increase your protein and calories, add ingredients such as whole milk, peanut butter, cheese, beans, meat, or seafood to meal items.  To get enough extra calories each day, include high-calorie foods and beverages at each meal.  Adding a high-calorie drink or shake can be an easy way to help you get enough calories each day. Talk with your healthcare provider or dietitian about the best options for you. This information is not intended to replace advice given to you by your health care provider. Make sure you discuss any questions you have with your health care provider. Document Revised: 03/23/2017 Document Reviewed: 02/20/2017 Elsevier Patient Education  2020 [employer].

## 2020-01-26 NOTE — Progress Notes (Signed)
Subjective:    Patient ID: Amber Lopez, female    DOB: [birth year], 79 y.o.   MRN: [id number]  No chief complaint on file. Patient accompanied by her daughter [name] Veith  HPI Pt is a 79 yo female with pmh sig for HIV, dementia, HTN, OA was seen today for f/u.  Pt with weight loss.  Lost 13 lbs since [facility] 12/03/19.  CXR ordered at time of last OFV but done 9/24 with a large effusion in left lung.  Prescription for Augmentin and azithromycin ER sent to pharmacy.  Rx for azithromycin filled, however prescription for Augmentin ER was $180.  Patient states she does not have an appetite and does not like eating leftovers.  Pt's daughter inquires about medication assistance programs for [place] as we will need paperwork completed soon.  Past Medical History:  Diagnosis Date  . Arthritis   . Depression   . Diabetes mellitus without complication ([place])   . Eating disorder   . Hyperlipidemia   . Hypertension   . Migraines     Allergies  Allergen Reactions  . Other Rash and Swelling    rash wirh swelling    ROS Unable to perform 2/2 h/o dementia     Objective:    Blood pressure 126/88, pulse (!) 110, temperature 99.1 F (37.3 C), temperature source Oral, SpO2 99 %.  Gen. Pleasant, well-nourished, in no distress, normal affect   HEENT: [place]/AT, face symmetric, conjunctiva clear, no scleral icterus, PERRLA, EOMI, nares patent without drainage Lungs: no accessory muscle use, decreased breath sounds in left lower lobe Cardiovascular: Tachycardia, no m/r/g, no peripheral edema Musculoskeletal: No deformities, no cyanosis or clubbing, normal tone Neuro:  A&Ox3, CN II-XII intact, sitting in wheelchair gait not assessed. Skin:  Warm, no lesions/ rash   Wt Readings from Last 3 Encounters:  12/03/19 125 lb (56.7 kg)  09/29/19 136 lb (61.7 kg)  08/25/19 137 lb (62.1 kg)    Lab Results  Component Value Date   WBC 4.5 03/13/2019   HGB 11.5 (L) 03/13/2019   HCT 35.1 03/13/2019   PLT  242 03/13/2019   GLUCOSE 237 (H) 08/11/2019   CHOL 282 (H) 03/13/2019   TRIG 128 03/13/2019   HDL 73 03/13/2019   LDLCALC 183 (H) 03/13/2019   ALT 7 08/11/2019   AST 14 08/11/2019   NA 138 08/11/2019   K 4.0 08/11/2019   CL 100 08/11/2019   CREATININE 1.26 (H) 08/11/2019   BUN 22 08/11/2019   CO2 26 08/11/2019    Assessment/Plan:  Pneumonia of left lower lobe due to infectious organism  -CXR from 01/15/2020 with large left pleural effusion with left base atelectasis -Pt to complete prescription for azithromycin sent on 01/20/2020 -Pt was unable to pick up Augmentin XR 1000-62.5 mg 2/2 cost. -We will d/c Augmentin XR and send in rx for Augmentin 875-125 mg -Given precautions for continued or worsening symptoms -Current effusion likely causing tachycardia and temperature elevation - Plan: CBC with Differential/Platelet, amoxicillin-clavulanate (AUGMENTIN) 875-125 MG tablet  Weight loss -Discussed various causes for decreased appetite including pneumonia, medications, progression of dementia -Consider supplemental drinks and/or peanut butter -Given information about high calorie high protein diet - Plan: CBC with Differential/Platelet, CMP with eGFR([facility]), Lipase, TSH, T4, free, Hemoglobin A1c  Essential hypertension  -Controlled -Continue metoprolol succinate 25 mg daily, hydrochlorothiazide 25 mg, K-Dur 20 milliequivalents -We will send in new prescription for HCTZ -Continue checking BP at home - Plan: hydrochlorothiazide (HYDRODIURIL) 25 MG tablet, potassium chloride SA (  KLOR-CON) 20 MEQ tablet  Dementia with behavioral disturbance, unspecified type -Stable -Not currently on medications  HIV disease -Continue Biktarvy -Discussed scheduling follow-up with ID clinic.  Given clinic phone number  F/u prn  Grier Mitts, MD

## 2020-01-27 NOTE — Addendum Note (Signed)
Addended by: Marrion Coy on: 01/27/2020 08:18 AM   Modules accepted: Orders

## 2020-01-28 ENCOUNTER — Ambulatory Visit: Payer: Medicare Other | Admitting: Cardiology

## 2020-01-29 ENCOUNTER — Telehealth: Payer: Self-pay | Admitting: *Deleted

## 2020-01-29 ENCOUNTER — Inpatient Hospital Stay (HOSPITAL_COMMUNITY)
Admission: EM | Admit: 2020-01-29 | Discharge: 2020-03-22 | DRG: 003 | Disposition: A | Discharge status: Skilled Nursing Facility | Payer: Medicare Other | Attending: Internal Medicine | Admitting: Internal Medicine

## 2020-01-29 ENCOUNTER — Emergency Department (HOSPITAL_COMMUNITY): Payer: Medicare Other

## 2020-01-29 ENCOUNTER — Emergency Department (HOSPITAL_COMMUNITY): Payer: Medicare Other | Admitting: Certified Registered Nurse Anesthetist

## 2020-01-29 ENCOUNTER — Inpatient Hospital Stay (HOSPITAL_COMMUNITY): Payer: Medicare Other

## 2020-01-29 ENCOUNTER — Other Ambulatory Visit: Payer: Self-pay

## 2020-01-29 ENCOUNTER — Encounter (HOSPITAL_COMMUNITY)
Admission: EM | Disposition: A | Discharge status: Skilled Nursing Facility | Payer: Self-pay | Source: Home / Self Care | Attending: Internal Medicine

## 2020-01-29 DIAGNOSIS — D72829 Elevated white blood cell count, unspecified: Secondary | ICD-10-CM | POA: Diagnosis not present

## 2020-01-29 DIAGNOSIS — I6523 Occlusion and stenosis of bilateral carotid arteries: Secondary | ICD-10-CM | POA: Diagnosis not present

## 2020-01-29 DIAGNOSIS — I611 Nontraumatic intracerebral hemorrhage in hemisphere, cortical: Secondary | ICD-10-CM | POA: Diagnosis not present

## 2020-01-29 DIAGNOSIS — J9621 Acute and chronic respiratory failure with hypoxia: Secondary | ICD-10-CM | POA: Diagnosis present

## 2020-01-29 DIAGNOSIS — J189 Pneumonia, unspecified organism: Secondary | ICD-10-CM | POA: Diagnosis present

## 2020-01-29 DIAGNOSIS — J910 Malignant pleural effusion: Secondary | ICD-10-CM | POA: Diagnosis present

## 2020-01-29 DIAGNOSIS — G8194 Hemiplegia, unspecified affecting left nondominant side: Secondary | ICD-10-CM | POA: Diagnosis not present

## 2020-01-29 DIAGNOSIS — E440 Moderate protein-calorie malnutrition: Secondary | ICD-10-CM | POA: Diagnosis not present

## 2020-01-29 DIAGNOSIS — I97618 Postprocedural hemorrhage and hematoma of a circulatory system organ or structure following other circulatory system procedure: Secondary | ICD-10-CM | POA: Diagnosis not present

## 2020-01-29 DIAGNOSIS — I63131 Cerebral infarction due to embolism of right carotid artery: Secondary | ICD-10-CM | POA: Diagnosis not present

## 2020-01-29 DIAGNOSIS — R262 Difficulty in walking, not elsewhere classified: Secondary | ICD-10-CM | POA: Diagnosis not present

## 2020-01-29 DIAGNOSIS — I6601 Occlusion and stenosis of right middle cerebral artery: Secondary | ICD-10-CM | POA: Diagnosis not present

## 2020-01-29 DIAGNOSIS — R29810 Facial weakness: Secondary | ICD-10-CM | POA: Diagnosis present

## 2020-01-29 DIAGNOSIS — C7802 Secondary malignant neoplasm of left lung: Secondary | ICD-10-CM | POA: Diagnosis present

## 2020-01-29 DIAGNOSIS — Z978 Presence of other specified devices: Secondary | ICD-10-CM

## 2020-01-29 DIAGNOSIS — C801 Malignant (primary) neoplasm, unspecified: Secondary | ICD-10-CM | POA: Diagnosis not present

## 2020-01-29 DIAGNOSIS — Z7189 Other specified counseling: Secondary | ICD-10-CM

## 2020-01-29 DIAGNOSIS — G709 Myoneural disorder, unspecified: Secondary | ICD-10-CM | POA: Diagnosis present

## 2020-01-29 DIAGNOSIS — Z9911 Dependence on respirator [ventilator] status: Secondary | ICD-10-CM | POA: Diagnosis not present

## 2020-01-29 DIAGNOSIS — Y838 Other surgical procedures as the cause of abnormal reaction of the patient, or of later complication, without mention of misadventure at the time of the procedure: Secondary | ICD-10-CM | POA: Diagnosis not present

## 2020-01-29 DIAGNOSIS — N179 Acute kidney failure, unspecified: Secondary | ICD-10-CM | POA: Diagnosis not present

## 2020-01-29 DIAGNOSIS — K820 Obstruction of gallbladder: Secondary | ICD-10-CM | POA: Diagnosis not present

## 2020-01-29 DIAGNOSIS — G936 Cerebral edema: Secondary | ICD-10-CM | POA: Diagnosis present

## 2020-01-29 DIAGNOSIS — M2550 Pain in unspecified joint: Secondary | ICD-10-CM | POA: Diagnosis not present

## 2020-01-29 DIAGNOSIS — Z4659 Encounter for fitting and adjustment of other gastrointestinal appliance and device: Secondary | ICD-10-CM

## 2020-01-29 DIAGNOSIS — Z452 Encounter for adjustment and management of vascular access device: Secondary | ICD-10-CM

## 2020-01-29 DIAGNOSIS — R29726 NIHSS score 26: Secondary | ICD-10-CM | POA: Diagnosis not present

## 2020-01-29 DIAGNOSIS — J45909 Unspecified asthma, uncomplicated: Secondary | ICD-10-CM | POA: Diagnosis not present

## 2020-01-29 DIAGNOSIS — I69354 Hemiplegia and hemiparesis following cerebral infarction affecting left non-dominant side: Secondary | ICD-10-CM | POA: Diagnosis not present

## 2020-01-29 DIAGNOSIS — D649 Anemia, unspecified: Secondary | ICD-10-CM | POA: Diagnosis not present

## 2020-01-29 DIAGNOSIS — G43909 Migraine, unspecified, not intractable, without status migrainosus: Secondary | ICD-10-CM | POA: Diagnosis present

## 2020-01-29 DIAGNOSIS — I69320 Aphasia following cerebral infarction: Secondary | ICD-10-CM | POA: Diagnosis not present

## 2020-01-29 DIAGNOSIS — E1169 Type 2 diabetes mellitus with other specified complication: Secondary | ICD-10-CM | POA: Diagnosis not present

## 2020-01-29 DIAGNOSIS — I63511 Cerebral infarction due to unspecified occlusion or stenosis of right middle cerebral artery: Secondary | ICD-10-CM | POA: Diagnosis not present

## 2020-01-29 DIAGNOSIS — Z9689 Presence of other specified functional implants: Secondary | ICD-10-CM | POA: Diagnosis not present

## 2020-01-29 DIAGNOSIS — Z7401 Bed confinement status: Secondary | ICD-10-CM | POA: Diagnosis not present

## 2020-01-29 DIAGNOSIS — I959 Hypotension, unspecified: Secondary | ICD-10-CM | POA: Diagnosis not present

## 2020-01-29 DIAGNOSIS — J9690 Respiratory failure, unspecified, unspecified whether with hypoxia or hypercapnia: Secondary | ICD-10-CM | POA: Diagnosis not present

## 2020-01-29 DIAGNOSIS — E1165 Type 2 diabetes mellitus with hyperglycemia: Secondary | ICD-10-CM | POA: Diagnosis present

## 2020-01-29 DIAGNOSIS — R918 Other nonspecific abnormal finding of lung field: Secondary | ICD-10-CM | POA: Diagnosis not present

## 2020-01-29 DIAGNOSIS — Z930 Tracheostomy status: Secondary | ICD-10-CM | POA: Diagnosis not present

## 2020-01-29 DIAGNOSIS — Z0189 Encounter for other specified special examinations: Secondary | ICD-10-CM

## 2020-01-29 DIAGNOSIS — J9811 Atelectasis: Secondary | ICD-10-CM | POA: Diagnosis not present

## 2020-01-29 DIAGNOSIS — I6340 Cerebral infarction due to embolism of unspecified cerebral artery: Secondary | ICD-10-CM | POA: Diagnosis not present

## 2020-01-29 DIAGNOSIS — R29725 NIHSS score 25: Secondary | ICD-10-CM | POA: Diagnosis not present

## 2020-01-29 DIAGNOSIS — Z21 Asymptomatic human immunodeficiency virus [HIV] infection status: Secondary | ICD-10-CM | POA: Diagnosis not present

## 2020-01-29 DIAGNOSIS — D62 Acute posthemorrhagic anemia: Secondary | ICD-10-CM | POA: Diagnosis not present

## 2020-01-29 DIAGNOSIS — E1136 Type 2 diabetes mellitus with diabetic cataract: Secondary | ICD-10-CM | POA: Diagnosis present

## 2020-01-29 DIAGNOSIS — B20 Human immunodeficiency virus [HIV] disease: Secondary | ICD-10-CM | POA: Diagnosis present

## 2020-01-29 DIAGNOSIS — J9601 Acute respiratory failure with hypoxia: Secondary | ICD-10-CM | POA: Diagnosis not present

## 2020-01-29 DIAGNOSIS — Z6821 Body mass index (BMI) 21.0-21.9, adult: Secondary | ICD-10-CM

## 2020-01-29 DIAGNOSIS — J449 Chronic obstructive pulmonary disease, unspecified: Secondary | ICD-10-CM | POA: Diagnosis not present

## 2020-01-29 DIAGNOSIS — R509 Fever, unspecified: Secondary | ICD-10-CM | POA: Diagnosis not present

## 2020-01-29 DIAGNOSIS — R64 Cachexia: Secondary | ICD-10-CM | POA: Diagnosis present

## 2020-01-29 DIAGNOSIS — Z20822 Contact with and (suspected) exposure to covid-19: Secondary | ICD-10-CM | POA: Diagnosis present

## 2020-01-29 DIAGNOSIS — L89892 Pressure ulcer of other site, stage 2: Secondary | ICD-10-CM | POA: Diagnosis present

## 2020-01-29 DIAGNOSIS — R9389 Abnormal findings on diagnostic imaging of other specified body structures: Secondary | ICD-10-CM | POA: Diagnosis not present

## 2020-01-29 DIAGNOSIS — C3492 Malignant neoplasm of unspecified part of left bronchus or lung: Secondary | ICD-10-CM | POA: Diagnosis not present

## 2020-01-29 DIAGNOSIS — R482 Apraxia: Secondary | ICD-10-CM | POA: Diagnosis not present

## 2020-01-29 DIAGNOSIS — F0390 Unspecified dementia without behavioral disturbance: Secondary | ICD-10-CM | POA: Diagnosis present

## 2020-01-29 DIAGNOSIS — E1151 Type 2 diabetes mellitus with diabetic peripheral angiopathy without gangrene: Secondary | ICD-10-CM | POA: Diagnosis not present

## 2020-01-29 DIAGNOSIS — Z853 Personal history of malignant neoplasm of breast: Secondary | ICD-10-CM

## 2020-01-29 DIAGNOSIS — Z9889 Other specified postprocedural states: Secondary | ICD-10-CM | POA: Diagnosis not present

## 2020-01-29 DIAGNOSIS — M6281 Muscle weakness (generalized): Secondary | ICD-10-CM | POA: Diagnosis not present

## 2020-01-29 DIAGNOSIS — Z8659 Personal history of other mental and behavioral disorders: Secondary | ICD-10-CM

## 2020-01-29 DIAGNOSIS — G319 Degenerative disease of nervous system, unspecified: Secondary | ICD-10-CM | POA: Diagnosis not present

## 2020-01-29 DIAGNOSIS — Z66 Do not resuscitate: Secondary | ICD-10-CM | POA: Diagnosis not present

## 2020-01-29 DIAGNOSIS — I63411 Cerebral infarction due to embolism of right middle cerebral artery: Principal | ICD-10-CM | POA: Diagnosis present

## 2020-01-29 DIAGNOSIS — Z882 Allergy status to sulfonamides status: Secondary | ICD-10-CM

## 2020-01-29 DIAGNOSIS — Y95 Nosocomial condition: Secondary | ICD-10-CM | POA: Diagnosis present

## 2020-01-29 DIAGNOSIS — I110 Hypertensive heart disease with heart failure: Secondary | ICD-10-CM | POA: Diagnosis not present

## 2020-01-29 DIAGNOSIS — R29733 NIHSS score 33: Secondary | ICD-10-CM | POA: Diagnosis not present

## 2020-01-29 DIAGNOSIS — I69328 Other speech and language deficits following cerebral infarction: Secondary | ICD-10-CM | POA: Diagnosis not present

## 2020-01-29 DIAGNOSIS — Z931 Gastrostomy status: Secondary | ICD-10-CM | POA: Diagnosis not present

## 2020-01-29 DIAGNOSIS — L8990 Pressure ulcer of unspecified site, unspecified stage: Secondary | ICD-10-CM | POA: Insufficient documentation

## 2020-01-29 DIAGNOSIS — E11649 Type 2 diabetes mellitus with hypoglycemia without coma: Secondary | ICD-10-CM | POA: Diagnosis not present

## 2020-01-29 DIAGNOSIS — R627 Adult failure to thrive: Secondary | ICD-10-CM | POA: Diagnosis not present

## 2020-01-29 DIAGNOSIS — J440 Chronic obstructive pulmonary disease with acute lower respiratory infection: Secondary | ICD-10-CM | POA: Diagnosis present

## 2020-01-29 DIAGNOSIS — R1312 Dysphagia, oropharyngeal phase: Secondary | ICD-10-CM | POA: Diagnosis not present

## 2020-01-29 DIAGNOSIS — C3491 Malignant neoplasm of unspecified part of right bronchus or lung: Secondary | ICD-10-CM | POA: Diagnosis not present

## 2020-01-29 DIAGNOSIS — A419 Sepsis, unspecified organism: Secondary | ICD-10-CM | POA: Diagnosis not present

## 2020-01-29 DIAGNOSIS — E785 Hyperlipidemia, unspecified: Secondary | ICD-10-CM | POA: Diagnosis not present

## 2020-01-29 DIAGNOSIS — Z743 Need for continuous supervision: Secondary | ICD-10-CM | POA: Diagnosis not present

## 2020-01-29 DIAGNOSIS — F32A Depression, unspecified: Secondary | ICD-10-CM | POA: Diagnosis present

## 2020-01-29 DIAGNOSIS — R6889 Other general symptoms and signs: Secondary | ICD-10-CM | POA: Diagnosis not present

## 2020-01-29 DIAGNOSIS — R0682 Tachypnea, not elsewhere classified: Secondary | ICD-10-CM

## 2020-01-29 DIAGNOSIS — R569 Unspecified convulsions: Secondary | ICD-10-CM | POA: Diagnosis not present

## 2020-01-29 DIAGNOSIS — Z4682 Encounter for fitting and adjustment of non-vascular catheter: Secondary | ICD-10-CM | POA: Diagnosis not present

## 2020-01-29 DIAGNOSIS — S066X0A Traumatic subarachnoid hemorrhage without loss of consciousness, initial encounter: Secondary | ICD-10-CM | POA: Diagnosis not present

## 2020-01-29 DIAGNOSIS — M1990 Unspecified osteoarthritis, unspecified site: Secondary | ICD-10-CM | POA: Diagnosis present

## 2020-01-29 DIAGNOSIS — J9602 Acute respiratory failure with hypercapnia: Secondary | ICD-10-CM | POA: Diagnosis not present

## 2020-01-29 DIAGNOSIS — Z803 Family history of malignant neoplasm of breast: Secondary | ICD-10-CM

## 2020-01-29 DIAGNOSIS — M419 Scoliosis, unspecified: Secondary | ICD-10-CM | POA: Diagnosis not present

## 2020-01-29 DIAGNOSIS — E119 Type 2 diabetes mellitus without complications: Secondary | ICD-10-CM | POA: Diagnosis not present

## 2020-01-29 DIAGNOSIS — Z515 Encounter for palliative care: Secondary | ICD-10-CM

## 2020-01-29 DIAGNOSIS — Z79899 Other long term (current) drug therapy: Secondary | ICD-10-CM

## 2020-01-29 DIAGNOSIS — I700 Atherosclerosis of aorta: Secondary | ICD-10-CM | POA: Diagnosis not present

## 2020-01-29 DIAGNOSIS — R4781 Slurred speech: Secondary | ICD-10-CM | POA: Diagnosis not present

## 2020-01-29 DIAGNOSIS — S06360A Traumatic hemorrhage of cerebrum, unspecified, without loss of consciousness, initial encounter: Secondary | ICD-10-CM | POA: Diagnosis not present

## 2020-01-29 DIAGNOSIS — I609 Nontraumatic subarachnoid hemorrhage, unspecified: Secondary | ICD-10-CM | POA: Diagnosis not present

## 2020-01-29 DIAGNOSIS — I639 Cerebral infarction, unspecified: Secondary | ICD-10-CM | POA: Diagnosis not present

## 2020-01-29 DIAGNOSIS — I10 Essential (primary) hypertension: Secondary | ICD-10-CM | POA: Diagnosis present

## 2020-01-29 DIAGNOSIS — I6389 Other cerebral infarction: Secondary | ICD-10-CM | POA: Diagnosis not present

## 2020-01-29 DIAGNOSIS — R590 Localized enlarged lymph nodes: Secondary | ICD-10-CM | POA: Diagnosis present

## 2020-01-29 DIAGNOSIS — H518 Other specified disorders of binocular movement: Secondary | ICD-10-CM | POA: Diagnosis present

## 2020-01-29 DIAGNOSIS — C50919 Malignant neoplasm of unspecified site of unspecified female breast: Secondary | ICD-10-CM | POA: Diagnosis not present

## 2020-01-29 DIAGNOSIS — Z9109 Other allergy status, other than to drugs and biological substances: Secondary | ICD-10-CM

## 2020-01-29 DIAGNOSIS — R0902 Hypoxemia: Secondary | ICD-10-CM | POA: Diagnosis not present

## 2020-01-29 DIAGNOSIS — I69391 Dysphagia following cerebral infarction: Secondary | ICD-10-CM | POA: Diagnosis not present

## 2020-01-29 DIAGNOSIS — N3289 Other specified disorders of bladder: Secondary | ICD-10-CM | POA: Diagnosis not present

## 2020-01-29 DIAGNOSIS — I6300 Cerebral infarction due to thrombosis of unspecified precerebral artery: Secondary | ICD-10-CM | POA: Diagnosis not present

## 2020-01-29 DIAGNOSIS — R338 Other retention of urine: Secondary | ICD-10-CM | POA: Diagnosis not present

## 2020-01-29 DIAGNOSIS — I2510 Atherosclerotic heart disease of native coronary artery without angina pectoris: Secondary | ICD-10-CM | POA: Diagnosis not present

## 2020-01-29 DIAGNOSIS — J90 Pleural effusion, not elsewhere classified: Secondary | ICD-10-CM | POA: Diagnosis not present

## 2020-01-29 DIAGNOSIS — I5030 Unspecified diastolic (congestive) heart failure: Secondary | ICD-10-CM | POA: Diagnosis not present

## 2020-01-29 DIAGNOSIS — R414 Neurologic neglect syndrome: Secondary | ICD-10-CM | POA: Diagnosis present

## 2020-01-29 DIAGNOSIS — R4701 Aphasia: Secondary | ICD-10-CM | POA: Diagnosis present

## 2020-01-29 DIAGNOSIS — F419 Anxiety disorder, unspecified: Secondary | ICD-10-CM | POA: Diagnosis present

## 2020-01-29 DIAGNOSIS — R0600 Dyspnea, unspecified: Secondary | ICD-10-CM

## 2020-01-29 DIAGNOSIS — Z431 Encounter for attention to gastrostomy: Secondary | ICD-10-CM | POA: Diagnosis not present

## 2020-01-29 DIAGNOSIS — R404 Transient alteration of awareness: Secondary | ICD-10-CM | POA: Diagnosis not present

## 2020-01-29 DIAGNOSIS — R1310 Dysphagia, unspecified: Secondary | ICD-10-CM | POA: Diagnosis not present

## 2020-01-29 DIAGNOSIS — J9622 Acute and chronic respiratory failure with hypercapnia: Secondary | ICD-10-CM | POA: Diagnosis not present

## 2020-01-29 DIAGNOSIS — B957 Other staphylococcus as the cause of diseases classified elsewhere: Secondary | ICD-10-CM | POA: Diagnosis present

## 2020-01-29 HISTORY — PX: IR PERCUTANEOUS ART THROMBECTOMY/INFUSION INTRACRANIAL INC DIAG ANGIO: IMG6087

## 2020-01-29 HISTORY — PX: IR CT HEAD LTD: IMG2386

## 2020-01-29 HISTORY — PX: RADIOLOGY WITH ANESTHESIA: SHX6223

## 2020-01-29 LAB — POCT I-STAT 7, (LYTES, BLD GAS, ICA,H+H)
Acid-base deficit: 5 mmol/L — ABNORMAL HIGH (ref 0.0–2.0)
Acid-base deficit: 6 mmol/L — ABNORMAL HIGH (ref 0.0–2.0)
Bicarbonate: 18.2 mmol/L — ABNORMAL LOW (ref 20.0–28.0)
Bicarbonate: 18.7 mmol/L — ABNORMAL LOW (ref 20.0–28.0)
Calcium, Ion: 1.08 mmol/L — ABNORMAL LOW (ref 1.15–1.40)
Calcium, Ion: 1.06 mmol/L — ABNORMAL LOW (ref 1.15–1.40)
HCT: 24 % — ABNORMAL LOW (ref 36.0–46.0)
HCT: 27 % — ABNORMAL LOW (ref 36.0–46.0)
Hemoglobin: 8.2 g/dL — ABNORMAL LOW (ref 12.0–15.0)
Hemoglobin: 9.2 g/dL — ABNORMAL LOW (ref 12.0–15.0)
O2 Saturation: 100 %
O2 Saturation: 99 %
Patient temperature: 98.6
Patient temperature: 98.6
Potassium: 3.3 mmol/L — ABNORMAL LOW (ref 3.5–5.1)
Potassium: 3.5 mmol/L (ref 3.5–5.1)
Sodium: 138 mmol/L (ref 135–145)
Sodium: 140 mmol/L (ref 135–145)
TCO2: 19 mmol/L — ABNORMAL LOW (ref 22–32)
TCO2: 20 mmol/L — ABNORMAL LOW (ref 22–32)
pCO2 arterial: 26.1 mmHg — ABNORMAL LOW (ref 32.0–48.0)
pCO2 arterial: 33.6 mmHg (ref 32.0–48.0)
pH, Arterial: 7.452 — ABNORMAL HIGH (ref 7.350–7.450)
pH, Arterial: 7.354 (ref 7.350–7.450)
pO2, Arterial: 197 mmHg — ABNORMAL HIGH (ref 83.0–108.0)
pO2, Arterial: 131 mmHg — ABNORMAL HIGH (ref 83.0–108.0)

## 2020-01-29 LAB — CBC
HCT: 42.3 % (ref 36.0–46.0)
Hemoglobin: 13.2 g/dL (ref 12.0–15.0)
MCH: 29.5 pg (ref 26.0–34.0)
MCHC: 31.2 g/dL (ref 30.0–36.0)
MCV: 94.6 fL (ref 80.0–100.0)
Platelets: 271 10*3/uL (ref 150–400)
RBC: 4.47 MIL/uL (ref 3.87–5.11)
RDW: 13.5 % (ref 11.5–15.5)
WBC: 9.1 10*3/uL (ref 4.0–10.5)
nRBC: 0 % (ref 0.0–0.2)

## 2020-01-29 LAB — COMPREHENSIVE METABOLIC PANEL
ALT: 12 U/L (ref 0–44)
AST: 21 U/L (ref 15–41)
Albumin: 2.8 g/dL — ABNORMAL LOW (ref 3.5–5.0)
Alkaline Phosphatase: 80 U/L (ref 38–126)
Anion gap: 17 — ABNORMAL HIGH (ref 5–15)
BUN: 29 mg/dL — ABNORMAL HIGH (ref 8–23)
CO2: 25 mmol/L (ref 22–32)
Calcium: 9.5 mg/dL (ref 8.9–10.3)
Chloride: 99 mmol/L (ref 98–111)
Creatinine, Ser: 1.69 mg/dL — ABNORMAL HIGH (ref 0.44–1.00)
GFR calc non Af Amer: 28 mL/min — ABNORMAL LOW (ref >60)
Glucose, Bld: 153 mg/dL — ABNORMAL HIGH (ref 70–99)
Potassium: 4.3 mmol/L (ref 3.5–5.1)
Sodium: 141 mmol/L (ref 135–145)
Total Bilirubin: 1 mg/dL (ref 0.3–1.2)
Total Protein: 7.7 g/dL (ref 6.5–8.1)

## 2020-01-29 LAB — I-STAT CHEM 8, ED
BUN: 32 mg/dL — ABNORMAL HIGH (ref 8–23)
Calcium, Ion: 1.12 mmol/L — ABNORMAL LOW (ref 1.15–1.40)
Chloride: 101 mmol/L (ref 98–111)
Creatinine, Ser: 1.6 mg/dL — ABNORMAL HIGH (ref 0.44–1.00)
Glucose, Bld: 151 mg/dL — ABNORMAL HIGH (ref 70–99)
HCT: 40 % (ref 36.0–46.0)
Hemoglobin: 13.6 g/dL (ref 12.0–15.0)
Potassium: 4 mmol/L (ref 3.5–5.1)
Sodium: 140 mmol/L (ref 135–145)
TCO2: 25 mmol/L (ref 22–32)

## 2020-01-29 LAB — PROTIME-INR
INR: 1.2 (ref 0.8–1.2)
Prothrombin Time: 14.9 seconds (ref 11.4–15.2)

## 2020-01-29 LAB — RESPIRATORY PANEL BY RT PCR (FLU A&B, COVID)
Influenza A by PCR: NEGATIVE
Influenza B by PCR: NEGATIVE
SARS Coronavirus 2 by RT PCR: NEGATIVE

## 2020-01-29 LAB — GLUCOSE, CAPILLARY
Glucose-Capillary: 226 mg/dL — ABNORMAL HIGH (ref 70–99)
Glucose-Capillary: 224 mg/dL — ABNORMAL HIGH (ref 70–99)
Glucose-Capillary: 169 mg/dL — ABNORMAL HIGH (ref 70–99)

## 2020-01-29 LAB — DIFFERENTIAL
Abs Immature Granulocytes: 0.06 10*3/uL (ref 0.00–0.07)
Basophils Absolute: 0 10*3/uL (ref 0.0–0.1)
Basophils Relative: 0 %
Eosinophils Absolute: 0.2 10*3/uL (ref 0.0–0.5)
Eosinophils Relative: 2 %
Immature Granulocytes: 1 %
Lymphocytes Relative: 11 %
Lymphs Abs: 1 10*3/uL (ref 0.7–4.0)
Monocytes Absolute: 0.8 10*3/uL (ref 0.1–1.0)
Monocytes Relative: 8 %
Neutro Abs: 7.1 10*3/uL (ref 1.7–7.7)
Neutrophils Relative %: 78 %

## 2020-01-29 LAB — HEMOGLOBIN A1C
Hgb A1c MFr Bld: 6.5 % — ABNORMAL HIGH (ref 4.8–5.6)
Mean Plasma Glucose: 139.85 mg/dL

## 2020-01-29 LAB — APTT: aPTT: 28 seconds (ref 24–36)

## 2020-01-29 LAB — CBG MONITORING, ED: Glucose-Capillary: 141 mg/dL — ABNORMAL HIGH (ref 70–99)

## 2020-01-29 LAB — MRSA PCR SCREENING: MRSA by PCR: NEGATIVE

## 2020-01-29 SURGERY — IR WITH ANESTHESIA
Anesthesia: General

## 2020-01-29 MED ORDER — ALTEPLASE (STROKE) FULL DOSE INFUSION
0.9000 mg/kg | Freq: Once | INTRAVENOUS | Status: AC
  Administered 2020-01-29: 48.3 mg via INTRAVENOUS
  Filled 2020-01-29: qty 100

## 2020-01-29 MED ORDER — POTASSIUM CHLORIDE CRYS ER 20 MEQ PO TBCR: 20.0000 meq | EXTENDED_RELEASE_TABLET | Freq: Every day | ORAL | Status: DC

## 2020-01-29 MED ORDER — ACETAMINOPHEN 160 MG/5ML PO SOLN
650.0000 mg | ORAL | Status: DC | PRN
  Administered 2020-02-02 – 2020-02-23 (×25): 650 mg
  Filled 2020-01-29 (×25): qty 20.3

## 2020-01-29 MED ORDER — ANASTROZOLE 1 MG PO TABS: 1.0000 mg | ORAL_TABLET | Freq: Every day | ORAL | Status: DC

## 2020-01-29 MED ORDER — CLEVIDIPINE BUTYRATE 0.5 MG/ML IV EMUL: 0.0000 mg/h | INTRAVENOUS | Status: DC

## 2020-01-29 MED ORDER — ACETAMINOPHEN 325 MG PO TABS
650.0000 mg | ORAL_TABLET | ORAL | Status: DC | PRN
  Administered 2020-02-07 – 2020-03-09 (×3): 650 mg via ORAL
  Filled 2020-01-29 (×3): qty 2

## 2020-01-29 MED ORDER — IOHEXOL 300 MG/ML  SOLN
50.0000 mL | Freq: Once | INTRAMUSCULAR | Status: AC | PRN
  Administered 2020-01-29: 25 mL via INTRA_ARTERIAL

## 2020-01-29 MED ORDER — IOHEXOL 350 MG/ML SOLN
80.0000 mL | Freq: Once | INTRAVENOUS | Status: AC | PRN
  Administered 2020-01-29: 80 mL via INTRAVENOUS

## 2020-01-29 MED ORDER — CHLORHEXIDINE GLUCONATE 0.12% ORAL RINSE (MEDLINE KIT)
15.0000 mL | Freq: Two times a day (BID) | OROMUCOSAL | Status: DC
  Administered 2020-01-29 – 2020-02-23 (×50): 15 mL via OROMUCOSAL

## 2020-01-29 MED ORDER — ASPIRIN 81 MG PO CHEW
CHEWABLE_TABLET | ORAL | Status: AC
  Filled 2020-01-29: qty 1

## 2020-01-29 MED ORDER — METOPROLOL SUCCINATE ER 25 MG PO TB24: 25.0000 mg | ORAL_TABLET | Freq: Every day | ORAL | Status: DC

## 2020-01-29 MED ORDER — CHLORHEXIDINE GLUCONATE 0.12% ORAL RINSE (MEDLINE KIT): 15.0000 mL | Freq: Two times a day (BID) | OROMUCOSAL | Status: DC

## 2020-01-29 MED ORDER — CHLORHEXIDINE GLUCONATE CLOTH 2 % EX PADS
6.0000 | MEDICATED_PAD | Freq: Every day | CUTANEOUS | Status: DC
  Administered 2020-01-29 – 2020-03-21 (×52): 6 via TOPICAL

## 2020-01-29 MED ORDER — ROCURONIUM BROMIDE 100 MG/10ML IV SOLN
INTRAVENOUS | Status: DC | PRN
  Administered 2020-01-29: 80 mg via INTRAVENOUS

## 2020-01-29 MED ORDER — VERAPAMIL HCL 2.5 MG/ML IV SOLN
INTRAVENOUS | Status: AC
  Filled 2020-01-29: qty 2

## 2020-01-29 MED ORDER — SODIUM CHLORIDE 0.9% FLUSH: 3.0000 mL | Freq: Once | INTRAVENOUS | Status: DC

## 2020-01-29 MED ORDER — PHENYLEPHRINE HCL-NACL 10-0.9 MG/250ML-% IV SOLN
25.0000 ug/min | INTRAVENOUS | Status: DC
  Administered 2020-01-29: 95 ug/min via INTRAVENOUS
  Administered 2020-01-29: 30 ug/min via INTRAVENOUS
  Administered 2020-01-29: 95 ug/min via INTRAVENOUS
  Administered 2020-01-29: 80 ug/min via INTRAVENOUS
  Administered 2020-01-29: 105 ug/min via INTRAVENOUS
  Administered 2020-01-30: 60 ug/min via INTRAVENOUS
  Administered 2020-01-30: 25 ug/min via INTRAVENOUS
  Administered 2020-01-30: 60 ug/min via INTRAVENOUS
  Administered 2020-02-02: 25 ug/min via INTRAVENOUS
  Administered 2020-02-02: 50 ug/min via INTRAVENOUS
  Filled 2020-01-29 (×11): qty 250

## 2020-01-29 MED ORDER — SODIUM CHLORIDE 0.9 % IV SOLN: INTRAVENOUS | Status: DC

## 2020-01-29 MED ORDER — PROPOFOL 500 MG/50ML IV EMUL
INTRAVENOUS | Status: DC | PRN
  Administered 2020-01-29: 70 ug/kg/min via INTRAVENOUS

## 2020-01-29 MED ORDER — IOHEXOL 300 MG/ML  SOLN
150.0000 mL | Freq: Once | INTRAMUSCULAR | Status: AC | PRN
  Administered 2020-01-29: 75 mL via INTRA_ARTERIAL

## 2020-01-29 MED ORDER — ORAL CARE MOUTH RINSE
15.0000 mL | OROMUCOSAL | Status: DC
  Administered 2020-01-29 – 2020-02-23 (×243): 15 mL via OROMUCOSAL

## 2020-01-29 MED ORDER — EPHEDRINE SULFATE-NACL 50-0.9 MG/10ML-% IV SOSY
PREFILLED_SYRINGE | INTRAVENOUS | Status: DC | PRN
  Administered 2020-01-29 (×4): 5 mg via INTRAVENOUS
  Administered 2020-01-29: 2.5 mg via INTRAVENOUS
  Administered 2020-01-29 (×2): 5 mg via INTRAVENOUS
  Administered 2020-01-29: 2.5 mg via INTRAVENOUS

## 2020-01-29 MED ORDER — ORAL CARE MOUTH RINSE: 15.0000 mL | OROMUCOSAL | Status: DC

## 2020-01-29 MED ORDER — CLEVIDIPINE BUTYRATE 0.5 MG/ML IV EMUL
INTRAVENOUS | Status: AC
  Filled 2020-01-29: qty 50

## 2020-01-29 MED ORDER — ACETAMINOPHEN 650 MG RE SUPP: 650.0000 mg | RECTAL | Status: DC | PRN

## 2020-01-29 MED ORDER — SENNOSIDES-DOCUSATE SODIUM 8.6-50 MG PO TABS: 1.0000 | ORAL_TABLET | Freq: Every evening | ORAL | Status: DC | PRN

## 2020-01-29 MED ORDER — ACETAMINOPHEN 325 MG PO TABS: 650.0000 mg | ORAL_TABLET | ORAL | Status: DC | PRN

## 2020-01-29 MED ORDER — CLEVIDIPINE BUTYRATE 0.5 MG/ML IV EMUL
0.0000 mg/h | INTRAVENOUS | Status: DC
  Filled 2020-01-29: qty 50

## 2020-01-29 MED ORDER — TICAGRELOR 90 MG PO TABS
ORAL_TABLET | ORAL | Status: AC
  Filled 2020-01-29: qty 2

## 2020-01-29 MED ORDER — GLYCOPYRROLATE PF 0.2 MG/ML IJ SOSY
PREFILLED_SYRINGE | INTRAMUSCULAR | Status: DC | PRN
  Administered 2020-01-29: .1 mg via INTRAVENOUS

## 2020-01-29 MED ORDER — IOHEXOL 300 MG/ML  SOLN
150.0000 mL | Freq: Once | INTRAMUSCULAR | Status: AC | PRN
  Administered 2020-01-29: 25 mL via INTRA_ARTERIAL

## 2020-01-29 MED ORDER — PROPOFOL 1000 MG/100ML IV EMUL
0.0000 ug/kg/min | INTRAVENOUS | Status: DC
  Administered 2020-01-29: 20 ug/kg/min via INTRAVENOUS
  Administered 2020-01-29: 30 ug/kg/min via INTRAVENOUS
  Administered 2020-01-29: 15 ug/kg/min via INTRAVENOUS
  Administered 2020-01-30: 10 ug/kg/min via INTRAVENOUS
  Administered 2020-01-30: 20 ug/kg/min via INTRAVENOUS
  Administered 2020-01-31: 25 ug/kg/min via INTRAVENOUS
  Administered 2020-01-31: 50 ug/kg/min via INTRAVENOUS
  Administered 2020-02-02: 10 ug/kg/min via INTRAVENOUS
  Filled 2020-01-29 (×6): qty 100

## 2020-01-29 MED ORDER — TIROFIBAN HCL IN NACL 5-0.9 MG/100ML-% IV SOLN
INTRAVENOUS | Status: AC
  Filled 2020-01-29: qty 100

## 2020-01-29 MED ORDER — HYDROCHLOROTHIAZIDE 25 MG PO TABS: 25.0000 mg | ORAL_TABLET | Freq: Every day | ORAL | Status: DC

## 2020-01-29 MED ORDER — PROPOFOL 1000 MG/100ML IV EMUL
INTRAVENOUS | Status: AC
  Filled 2020-01-29: qty 100

## 2020-01-29 MED ORDER — FENTANYL CITRATE (PF) 100 MCG/2ML IJ SOLN
INTRAMUSCULAR | Status: AC
  Filled 2020-01-29: qty 2

## 2020-01-29 MED ORDER — AMOXICILLIN-POT CLAVULANATE 875-125 MG PO TABS: 1.0000 | ORAL_TABLET | Freq: Two times a day (BID) | ORAL | Status: DC

## 2020-01-29 MED ORDER — PROPOFOL 10 MG/ML IV BOLUS
INTRAVENOUS | Status: DC | PRN
  Administered 2020-01-29: 150 mg via INTRAVENOUS

## 2020-01-29 MED ORDER — BICTEGRAVIR-EMTRICITAB-TENOFOV 50-200-25 MG PO TABS: 1.0000 | ORAL_TABLET | Freq: Every day | ORAL | Status: DC

## 2020-01-29 MED ORDER — ESMOLOL HCL 100 MG/10ML IV SOLN
INTRAVENOUS | Status: DC | PRN
  Administered 2020-01-29 (×2): 20 mg via INTRAVENOUS

## 2020-01-29 MED ORDER — MELATONIN 3 MG PO TABS: 6.0000 mg | ORAL_TABLET | Freq: Every day | ORAL | Status: DC

## 2020-01-29 MED ORDER — PHENYLEPHRINE HCL-NACL 20-0.9 MG/250ML-% IV SOLN
INTRAVENOUS | Status: DC | PRN
  Administered 2020-01-29: 25 ug/min via INTRAVENOUS

## 2020-01-29 MED ORDER — ACETAMINOPHEN 160 MG/5ML PO SOLN: 650.0000 mg | ORAL | Status: DC | PRN

## 2020-01-29 MED ORDER — DOLUTEGRAVIR SODIUM 50 MG PO TABS
50.0000 mg | ORAL_TABLET | Freq: Every day | ORAL | Status: DC
  Filled 2020-01-29: qty 1

## 2020-01-29 MED ORDER — CLOPIDOGREL BISULFATE 300 MG PO TABS
ORAL_TABLET | ORAL | Status: AC
  Filled 2020-01-29: qty 1

## 2020-01-29 MED ORDER — ACETAMINOPHEN 650 MG RE SUPP
650.0000 mg | RECTAL | Status: DC | PRN
  Filled 2020-01-29: qty 1

## 2020-01-29 MED ORDER — CEFAZOLIN SODIUM-DEXTROSE 2-4 GM/100ML-% IV SOLN
INTRAVENOUS | Status: AC
  Filled 2020-01-29: qty 100

## 2020-01-29 MED ORDER — CEFAZOLIN SODIUM-DEXTROSE 2-3 GM-%(50ML) IV SOLR
INTRAVENOUS | Status: DC | PRN
  Administered 2020-01-29: 2 g via INTRAVENOUS

## 2020-01-29 MED ORDER — PHENYLEPHRINE 40 MCG/ML (10ML) SYRINGE FOR IV PUSH (FOR BLOOD PRESSURE SUPPORT)
PREFILLED_SYRINGE | INTRAVENOUS | Status: DC | PRN
  Administered 2020-01-29: 80 ug via INTRAVENOUS
  Administered 2020-01-29: 40 ug via INTRAVENOUS
  Administered 2020-01-29 (×4): 80 ug via INTRAVENOUS

## 2020-01-29 MED ORDER — FENTANYL CITRATE (PF) 100 MCG/2ML IJ SOLN
25.0000 ug | INTRAMUSCULAR | Status: DC | PRN
  Administered 2020-01-31: 50 ug via INTRAVENOUS
  Administered 2020-02-01 (×3): 100 ug via INTRAVENOUS
  Filled 2020-01-29 (×4): qty 2

## 2020-01-29 MED ORDER — SODIUM CHLORIDE 0.9 % IV SOLN
250.0000 mL | INTRAVENOUS | Status: DC
  Administered 2020-02-10 – 2020-02-11 (×2): 250 mL via INTRAVENOUS

## 2020-01-29 MED ORDER — INSULIN ASPART 100 UNIT/ML ~~LOC~~ SOLN
0.0000 [IU] | SUBCUTANEOUS | Status: DC
  Administered 2020-01-29 (×2): 3 [IU] via SUBCUTANEOUS
  Administered 2020-01-29: 2 [IU] via SUBCUTANEOUS
  Administered 2020-01-30 – 2020-02-01 (×6): 1 [IU] via SUBCUTANEOUS
  Administered 2020-02-01: 3 [IU] via SUBCUTANEOUS
  Administered 2020-02-01 (×2): 1 [IU] via SUBCUTANEOUS
  Administered 2020-02-02: 3 [IU] via SUBCUTANEOUS
  Administered 2020-02-02 (×2): 2 [IU] via SUBCUTANEOUS

## 2020-01-29 MED ORDER — "THROMBI-PAD 3""X3"" EX PADS"
1.0000 | MEDICATED_PAD | CUTANEOUS | Status: AC
  Administered 2020-01-29: 1 via TOPICAL
  Filled 2020-01-29: qty 1

## 2020-01-29 MED ORDER — GLYCOPYRROLATE 0.2 MG/ML IJ SOLN: INTRAMUSCULAR | Status: DC | PRN

## 2020-01-29 MED ORDER — EMTRICITABINE-TENOFOVIR AF 200-25 MG PO TABS
1.0000 | ORAL_TABLET | Freq: Every day | ORAL | Status: DC
  Filled 2020-01-29: qty 1

## 2020-01-29 MED ORDER — FENTANYL CITRATE (PF) 100 MCG/2ML IJ SOLN
INTRAMUSCULAR | Status: DC | PRN
  Administered 2020-01-29 (×2): 50 ug via INTRAVENOUS

## 2020-01-29 MED ORDER — FENTANYL CITRATE (PF) 100 MCG/2ML IJ SOLN: 25.0000 ug | INTRAMUSCULAR | Status: DC | PRN

## 2020-01-29 MED ORDER — POLYETHYLENE GLYCOL 3350 17 G PO PACK: 17.0000 g | PACK | Freq: Every day | ORAL | Status: DC

## 2020-01-29 MED ORDER — DOCUSATE SODIUM 50 MG/5ML PO LIQD: 100.0000 mg | Freq: Two times a day (BID) | ORAL | Status: DC

## 2020-01-29 MED ORDER — PANTOPRAZOLE SODIUM 40 MG IV SOLR
40.0000 mg | Freq: Every day | INTRAVENOUS | Status: DC
  Administered 2020-01-29 – 2020-02-04 (×7): 40 mg via INTRAVENOUS
  Filled 2020-01-29 (×7): qty 40

## 2020-01-29 MED ORDER — SODIUM CHLORIDE 0.9 % IV SOLN: 50.0000 mL | Freq: Once | INTRAVENOUS | Status: AC

## 2020-01-29 MED ORDER — NITROGLYCERIN 1 MG/10 ML FOR IR/CATH LAB
INTRA_ARTERIAL | Status: AC
  Administered 2020-01-29: 25 ug via INTRA_ARTERIAL
  Filled 2020-01-29: qty 10

## 2020-01-29 MED ORDER — IOHEXOL 240 MG/ML SOLN
INTRAMUSCULAR | Status: AC
  Filled 2020-01-29: qty 200

## 2020-01-29 MED ORDER — EPTIFIBATIDE 20 MG/10ML IV SOLN
INTRAVENOUS | Status: AC
  Filled 2020-01-29: qty 10

## 2020-01-29 MED ORDER — STROKE: EARLY STAGES OF RECOVERY BOOK: Freq: Once | Status: AC

## 2020-01-29 NOTE — Progress Notes (Signed)
PT Cancellation Note  Patient Details Name: Amber Lopez MRN: [id number] DOB: 1941/02/03   Cancelled Treatment:    Reason Eval/Treat Not Completed: Active bedrest order. PT received IV tPA and is on strict bedrest at this time. PT will follow up when pt is off bedrest and able to mobilize with therapy.   Zenaida Niece 01/29/2020, 4:18 PM

## 2020-01-29 NOTE — Anesthesia Procedure Notes (Signed)
Arterial Line Insertion Start/End10/10/2019 9:00 AM, 01/29/2020 9:10 AM Performed by: Renato Shin, CRNA, CRNA  Preanesthetic checklist: patient identified, IV checked, site marked, risks and benefits discussed, surgical consent, monitors and equipment checked, pre-op evaluation, timeout performed and anesthesia consent Patient sedated Left, radial was placed Catheter size: 20 G Hand hygiene performed , maximum sterile barriers used  and Seldinger technique used Allen's test indicative of satisfactory collateral circulation Attempts: 1 Procedure performed without using ultrasound guided technique. Ultrasound Notes:anatomy identified, needle tip was noted to be adjacent to the nerve/plexus identified and no ultrasound evidence of intravascular and/or intraneural injection Following insertion, dressing applied and Biopatch. Post procedure assessment: normal  Patient tolerated the procedure well with no immediate complications.

## 2020-01-29 NOTE — Procedures (Signed)
S/P RT  Common carotid arteriogram followed by revascularization of RT MCA M 1 occlusion using x 1 pass with Tiger 21 retrievermx 1 pass with 5 mm x 37 mm embotrap retriever,x 1 pass with solitaire62mm x 20 mm, with aspiration  and x 2 passes with direct aspiration achieving a TICI 2C revascularization.  Post Ct brain No ICH . Contrast stain in the basal ganglia on the Rt. Distal pulses in feet  all palpable  Short 17F pinnacle sheath left in situ. Patient left intubated for airway protection. Pupils 76mm RT = Lt sluggish. S.Terrah Decoster MD

## 2020-01-29 NOTE — ED Provider Notes (Signed)
[place] EMERGENCY DEPARTMENT Provider Note   CSN: [id number] Arrival date & time: 01/29/20  0749  LEVEL 5 CAVEAT - ALTERED MENTAL STATUS  History No chief complaint on file.   STEPHINE LANGBEHN is a 79 y.o. female.  HPI 79 year old female presents as a code stroke.  She was seen normal around 6:40 AM by daughter in her room.  Daughter left and went to go give her her meds and when she came back the patient was altered and she thought unresponsive.  The patient seems to do some activity on the right side to command but has rightward gaze and left-sided flaccidity.  Blood pressure most recently around 180/90.  Further history is very limited as the patient is not speaking.  EMS reports that she was recently diagnosed with pneumonia but it was not Covid.   Past Medical History:  Diagnosis Date  . Arthritis   . Depression   . Diabetes mellitus without complication ([place])   . Eating disorder   . Hyperlipidemia   . Hypertension   . Migraines     Patient Active Problem List   Diagnosis Date Noted  . Healthcare maintenance 08/12/2019  . HIV disease ([place]) 03/03/2019  . History of breast cancer 03/01/2019  . Dementia with behavioral disturbance ([place]) 03/01/2019  . Arthritis 03/01/2019  . Essential hypertension 03/01/2019    Past Surgical History:  Procedure Laterality Date  . BREAST BIOPSY    . BREAST LUMPECTOMY Right    Years ago / Pt's daughter thinks it was cancer     OB History   No obstetric history on file.     Family History  Problem Relation Age of Onset  . Breast cancer Sister 8    Social History   Tobacco Use  . Smoking status: Never Smoker  . Smokeless tobacco: Never Used  Vaping Use  . Vaping Use: Never used  Substance Use Topics  . Alcohol use: Never  . Drug use: Never    Home Medications Prior to Admission medications   Medication Sig Start Date End Date Taking? Authorizing Provider  amoxicillin-clavulanate (AUGMENTIN)  875-125 MG tablet Take 1 tablet by mouth 2 (two) times daily. 01/26/20   Billie Ruddy, MD  anastrozole (ARIMIDEX) 1 MG tablet Take 1 mg by mouth daily.    [provider]  BIKTARVY 50-200-25 MG TABS tablet TAKE 1 TABLET BY MOUTH DAILY 01/27/20   [place], FNP  hydrochlorothiazide (HYDRODIURIL) 25 MG tablet TAKE 1 TABLET(25 MG) BY MOUTH DAILY 01/26/20   Billie Ruddy, MD  MELATONIN PO Take 6 mg by mouth at bedtime.    [provider]  metoprolol succinate (TOPROL-XL) 25 MG 24 hr tablet Take 1 tablet (25 mg total) by mouth daily. 12/31/19   Billie Ruddy, MD  potassium chloride SA (KLOR-CON) 20 MEQ tablet TAKE 1 TABLET(20 MEQ) BY MOUTH DAILY 01/26/20   Billie Ruddy, MD    Allergies    Other  Review of Systems   Review of Systems  Unable to perform ROS: Mental status change    Physical Exam Updated Vital Signs Wt 53.7 kg   BMI 21.65 kg/m   Physical Exam Vitals and nursing note reviewed.  Constitutional:      Appearance: She is well-developed.  HENT:     Head: Normocephalic and atraumatic.     Right Ear: External ear normal.     Left Ear: External ear normal.     Nose: Nose normal.  Eyes:     General:        Right eye: No discharge.        Left eye: No discharge.     Comments: Rightward gaze  Pulmonary:     Effort: Pulmonary effort is normal.  Abdominal:     General: There is no distension.  Skin:    General: Skin is warm and dry.  Neurological:     Mental Status: She is alert.     Comments: Does not talk. Mild facial droop. Normal strength RUE. Flaccid LUE. Legs weak  Psychiatric:        Mood and Affect: Mood is not anxious.     ED Results / Procedures / Treatments   Labs (all labs ordered are listed, but only abnormal results are displayed) Labs Reviewed  COMPREHENSIVE METABOLIC PANEL - Abnormal; Notable for the following components:      Result Value   Glucose, Bld 153 (*)    BUN 29 (*)    Creatinine, Ser 1.69 (*)     Albumin 2.8 (*)    GFR calc non Af Amer 28 (*)    Anion gap 17 (*)    All other components within normal limits  I-STAT CHEM 8, ED - Abnormal; Notable for the following components:   BUN 32 (*)    Creatinine, Ser 1.60 (*)    Glucose, Bld 151 (*)    Calcium, Ion 1.12 (*)    All other components within normal limits  CBG MONITORING, ED - Abnormal; Notable for the following components:   Glucose-Capillary 141 (*)    All other components within normal limits  RESPIRATORY PANEL BY RT PCR (FLU A&B, COVID)  PROTIME-INR  APTT  CBC  DIFFERENTIAL    EKG None  Radiology CT Code Stroke CTA Head W/WO contrast  Result Date: 01/29/2020 CLINICAL DATA:  Right-sided gaze preference EXAM: CT ANGIOGRAPHY HEAD AND NECK TECHNIQUE: Multidetector CT imaging of the head and neck was performed using the standard protocol during bolus administration of intravenous contrast. Multiplanar CT image reconstructions and MIPs were obtained to evaluate the vascular anatomy. Carotid stenosis measurements (when applicable) are obtained utilizing NASCET criteria, using the distal internal carotid diameter as the denominator. CONTRAST:  Dose is currently not known COMPARISON:  Head CTA from earlier today FINDINGS: CTA NECK FINDINGS Aortic arch: Atheromatous plaque.  Three vessel branching. Right carotid system: Atheromatous plaque mainly at the bifurcation. No stenosis or ulceration. Left carotid system: Atheromatous plaque primarily at the bifurcation. No stenosis or ulceration. Vertebral arteries: Proximal subclavian atherosclerosis without flow limiting stenosis. The left vertebral artery is dominant. No flow limiting stenosis or beading Skeleton: Prominent cervical spine degeneration. Other neck: No acute finding. Upper chest: Pleural and subpleural masslike nodularity greatest at the left apex where there is a 3.4 cm mass. Large left pleural effusion with multi segment atelectasis where partially covered. Prevascular  mediastinal adenopathy with single node measuring up to 17 x 10 mm. Review of the MIP images confirms the above findings CTA HEAD FINDINGS Anterior circulation: Right M1 occlusion with some M3 branch reconstitution. There is atheromatous calcification of the carotid siphons without flow limiting stenosis. No contralateral branch occlusion. Posterior circulation: Vertebrobasilar arteries are smooth and widely patent. Fetal type right PCA flow. No branch occlusion, beading, or aneurysm. Venous sinuses: Limited assessment in the arterial phase Anatomic variants: As above Critical Value/emergent results were called by telephone at the time of interpretation on 01/29/2020 at 8:29 am to provider ERIC LINDZEN ,  who is already aware. Review of the MIP images confirms the above findings IMPRESSION: 1. Emergent large vessel occlusion at the right M1 segment. 2. No visible embolic source. There is atherosclerosis without flow limiting stenosis or ulceration of major vessels. 3. 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 Associated mediastinal adenopathy. Recommend CT of the chest and abdomen with contrast. Electronically Signed   By: Monte Fantasia M.D.   On: 01/29/2020 08:34   CT Code Stroke CTA Neck W/WO contrast  Result Date: 01/29/2020 CLINICAL DATA:  Right-sided gaze preference EXAM: CT ANGIOGRAPHY HEAD AND NECK TECHNIQUE: Multidetector CT imaging of the head and neck was performed using the standard protocol during bolus administration of intravenous contrast. Multiplanar CT image reconstructions and MIPs were obtained to evaluate the vascular anatomy. Carotid stenosis measurements (when applicable) are obtained utilizing NASCET criteria, using the distal internal carotid diameter as the denominator. CONTRAST:  Dose is currently not known COMPARISON:  Head CTA from earlier today FINDINGS: CTA NECK FINDINGS Aortic arch: Atheromatous plaque.  Three vessel branching. Right  carotid system: Atheromatous plaque mainly at the bifurcation. No stenosis or ulceration. Left carotid system: Atheromatous plaque primarily at the bifurcation. No stenosis or ulceration. Vertebral arteries: Proximal subclavian atherosclerosis without flow limiting stenosis. The left vertebral artery is dominant. No flow limiting stenosis or beading Skeleton: Prominent cervical spine degeneration. Other neck: No acute finding. Upper chest: Pleural and subpleural masslike nodularity greatest at the left apex where there is a 3.4 cm mass. Large left pleural effusion with multi segment atelectasis where partially covered. Prevascular mediastinal adenopathy with single node measuring up to 17 x 10 mm. Review of the MIP images confirms the above findings CTA HEAD FINDINGS Anterior circulation: Right M1 occlusion with some M3 branch reconstitution. There is atheromatous calcification of the carotid siphons without flow limiting stenosis. No contralateral branch occlusion. Posterior circulation: Vertebrobasilar arteries are smooth and widely patent. Fetal type right PCA flow. No branch occlusion, beading, or aneurysm. Venous sinuses: Limited assessment in the arterial phase Anatomic variants: As above Critical Value/emergent results were called by telephone at the time of interpretation on 01/29/2020 at 8:29 am to provider ERIC [facility] , who is already aware. Review of the MIP images confirms the above findings IMPRESSION: 1. Emergent large vessel occlusion at the right M1 segment. 2. No visible embolic source. There is atherosclerosis without flow limiting stenosis or ulceration of major vessels. 3. 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 Associated mediastinal adenopathy. Recommend CT of the chest and abdomen with contrast. Electronically Signed   By: Monte Fantasia M.D.   On: 01/29/2020 08:34   CT HEAD CODE STROKE WO CONTRAST  Result Date: 01/29/2020 CLINICAL DATA:   Code stroke. Facial droop and fixed gaze to the right EXAM: CT HEAD WITHOUT CONTRAST TECHNIQUE: Contiguous axial images were obtained from the base of the skull through the vertex without intravenous contrast. COMPARISON:  None. FINDINGS: Brain: No evidence of acute infarction, hemorrhage, hydrocephalus, extra-axial collection or mass lesion/mass effect. Generalized atrophy. Vascular: No hyperdense vessel or unexpected calcification. Skull: Normal. Negative for fracture or focal lesion. Sinuses/Orbits: Gaze to the right, as noted in the history Other: These results were communicated to Dr. Cheral Marker at 8:09 amon 10/7/2021by text page via the [facility] messaging system. ASPECTS [facility] Stroke Program Early CT Score) - Ganglionic level infarction (caudate, lentiform nuclei, internal capsule, insula, M1-M3 cortex): 7 - Supraganglionic infarction (M4-M6 cortex): 3 Total score (  0-10 with 10 being normal): 10 IMPRESSION: 1. No acute finding.  ASPECTS is 10. 2. Generalized atrophy. Electronically Signed   By: Monte Fantasia M.D.   On: 01/29/2020 08:09    Procedures .Critical Care Performed by: Sherwood Gambler, MD Authorized by: Sherwood Gambler, MD   Critical care provider statement:    Critical care time (minutes):  30   Critical care time was exclusive of:  Separately billable procedures and treating other patients   Critical care was necessary to treat or prevent imminent or life-threatening deterioration of the following conditions:  CNS failure or compromise   Critical care was time spent personally by me on the following activities:  Discussions with consultants, evaluation of patient's response to treatment, examination of patient, ordering and performing treatments and interventions, ordering and review of laboratory studies, ordering and review of radiographic studies, pulse oximetry, re-evaluation of patient's condition, obtaining history from patient or surrogate and review of old charts Ultrasound ED  Peripheral IV (Provider)  Date/Time: 01/29/2020 8:20 AM Performed by: Sherwood Gambler, MD Authorized by: Sherwood Gambler, MD   Procedure details:    Indications: multiple failed IV attempts and poor IV access     Skin Prep: chlorhexidine gluconate     Location:  Right AC   Angiocath:  18 G   Bedside Ultrasound Guided: Yes     Patient tolerated procedure without complications: Yes     Dressing applied: Yes   Ultrasound ED Peripheral IV (Provider)  Date/Time: 01/29/2020 8:20 AM Performed by: Sherwood Gambler, MD Authorized by: Sherwood Gambler, MD   Procedure details:    Indications: multiple failed IV attempts and poor IV access     Skin Prep: chlorhexidine gluconate     Location:  Left AC   Angiocath:  18 G   Bedside Ultrasound Guided: Yes     Patient tolerated procedure without complications: Yes     Dressing applied: Yes     (including critical care time)  Medications Ordered in ED Medications  sodium chloride flush (NS) 0.9 % injection 3 mL (has no administration in time range)  alteplase (ACTIVASE) 1 mg/mL infusion 48.3 mg (has no administration in time range)    Followed by  0.9 %  sodium chloride infusion (has no administration in time range)  iohexol (OMNIPAQUE) 240 MG/ML injection (has no administration in time range)  aspirin 81 MG chewable tablet (has no administration in time range)  verapamil (ISOPTIN) 2.5 MG/ML injection (has no administration in time range)  ticagrelor (BRILINTA) 90 MG tablet (has no administration in time range)  clopidogrel (PLAVIX) 300 MG tablet (has no administration in time range)  tirofiban (AGGRASTAT) 5-0.9 MG/100ML-% injection (has no administration in time range)  eptifibatide (INTEGRILIN) 20 MG/10ML injection (has no administration in time range)  fentaNYL (SUBLIMAZE) 100 MCG/2ML injection (has no administration in time range)  nitroGLYCERIN 100 mcg/mL intra-arterial injection (has no administration in time range)  iohexol (OMNIPAQUE)  350 MG/ML injection 80 mL (80 mLs Intravenous Contrast Given 01/29/20 0845)    ED Course  I have reviewed the triage vital signs and the nursing notes.  Pertinent labs & imaging results that were available during my care of the patient were reviewed by me and considered in my medical decision making (see chart for details).    MDM Rules/Calculators/A&P                          Patient presents as a code stroke.  She  is protecting her airway.  CT head without acute abnormalities and given her presentation she was evaluated for large vessel occlusion and found to be positive and M1.  IVs placed by myself given difficult access.  She was given TPA per neurology.  She was taken emergently to IR. Final Clinical Impression(s) / ED Diagnoses Final diagnoses:  Acute right MCA stroke [facility])    Rx / DC Orders ED Discharge Orders    None       Sherwood Gambler, MD 01/29/20 [phone]

## 2020-01-29 NOTE — Progress Notes (Signed)
PHARMACIST CODE STROKE RESPONSE  Notified to mix tPA at 0802 by Dr. Cheral Marker Delivered tPA to RN at 0805  tPA dose = 4.8 mg bolus over 1 minute followed by 43.5mg  for a total dose of 48.3mg  over 1 hour  Issues/delays encountered (if applicable): IV access issues requiring ultrasound + MD to obtain.    Bertis Ruddy 01/29/20 8:36 AM

## 2020-01-29 NOTE — Anesthesia Postprocedure Evaluation (Signed)
Anesthesia Post Note  Patient: Amber Lopez  Procedure(s) Performed: IR WITH ANESTHESIA - CODE STROKE (N/A )     Patient location during evaluation: PACU Anesthesia Type: General Level of consciousness: awake and alert Pain management: pain level controlled Vital Signs Assessment: post-procedure vital signs reviewed and stable Respiratory status: spontaneous breathing, nonlabored ventilation, respiratory function stable and patient connected to nasal cannula oxygen Cardiovascular status: blood pressure returned to baseline and stable Postop Assessment: no apparent nausea or vomiting Anesthetic complications: no   No complications documented.  Last Vitals:  Vitals:   01/29/20 0845 01/29/20 0900  BP: (!) 87/66 (!) 54/40  Pulse:    SpO2:      Last Pain: There were no vitals filed for this visit.               Aspynn Clover

## 2020-01-29 NOTE — Progress Notes (Addendum)
1300:  Patient arrive from OR to 4NICU intubated.  Patient unresponsive at this time.  Patient with paralytic on board per CRNA.  Sheath in place in right groin site with bleeding in gauze. CCM at bedside.  1305:  Right groin site saturated with blood.  RN started holding pressure at groin site, IR MD paged.  Verbal order to continue to hold pressure.  CCM also aware.   1400:  RN still holding pressure, site still bleeding.     1420:  Dr. Estanislado Pandy, IR, and CCM at bedside.  Old dressing lifted to assess site, sheath removed by Dr. Estanislado Pandy at 1430.  IR continue to hold pressure.   1500: IR redress right groin with quickclot and gauze.  No bleeding noted at this time.  Per Dr. Estanislado Pandy to check site frequently and hold pressure if new bleeding occurs.    1600:  Patient's daughter visiting.  3 rings returned to daughter and will take home.  Patient does not have any other belongings at bedside.

## 2020-01-29 NOTE — Code Documentation (Signed)
Stroke Response Nurse Documentation Code Documentation  Amber Lopez is a 79 y.o. female arriving to [facility] ED via [facility] on 01/29/2020 with past medical hx of DM, HLD, HTN, Migraines, and HIV. Code stroke was activated by EMS. Patient from home where she lives with her daughter, she was LKW at 12 when daughter woke her and then left to get her mother's medications. Patient largely independent at baseline and per daughter she only helps her with minor tasks like remembering her medicines, patient's daughter and son are reportedly POA. Patient now with symptoms L weakness, facial droop, R forced gaze, and mute. On No antithrombotic. Stroke team at the bedside on patient arrival. Labs drawn and patient cleared for CT by Dr. Regenia Skeeter. Patient to CT with team. NIHSS 25, see documentation for details and code stroke times. Patient with disoriented, right gaze preference , left hemianopia, left facial droop, left arm weakness, left leg weakness, left decreased sensation, Global aphasia  and Visual  neglect on exam. The following imaging was completed:  CT, CTA head and neck. Patient is a candidate for tPA, started at 0816 see MAR. Care/Plan: patient will go to IR for thrombectomy. Bedside handoff with IR RN Manuela Schwartz.    [place]  Stroke Response RN

## 2020-01-29 NOTE — H&P (Addendum)
Admission H&P    Chief Complaint: Acute stroke  HPI: Amber Lopez is an 79 y.o. female with a PMHx of depression, HLD, HIV, HTN and DM presenting with acute onset of left hemiplegia, left facial droop, right eye deviation and aphasia. LKN was 0640 when her daughter gave the patient her meds. The patient at that time was still in bed but fully conversant with no evidence for weakness. On returning home, the daughter noted her mother to be weak on the left and unable to speak. EMS was called and on arrival they noted the same. Vitals per EMS: 180/96, 88, 14, 100% on RA. CBG 132. Code Stroke was called in the field. On arrival to the ED the patient was hemiplegic on the left with left facial droop, right gaze deviation and nonverbal.   LSN: [id number] tPA Given: Yes mRS: 0 NIHSS: 25  Past Medical History:  Diagnosis Date  . Arthritis   . Depression   . Diabetes mellitus without complication ([place])   . Eating disorder   . Hyperlipidemia   . Hypertension   . Migraines   HIV positive  Past Surgical History:  Procedure Laterality Date  . BREAST BIOPSY    . BREAST LUMPECTOMY Right    Years ago / Pt's daughter thinks it was cancer    Family History  Problem Relation Age of Onset  . Breast cancer Sister 102   Social History:  reports that she has never smoked. She has never used smokeless tobacco. She reports that she does not drink alcohol and does not use drugs.  Allergies:  Allergies  Allergen Reactions  . Other Rash and Swelling    rash wirh swelling   No current facility-administered medications on file prior to encounter.   Current Outpatient Medications on File Prior to Encounter  Medication Sig Dispense Refill  . amoxicillin-clavulanate (AUGMENTIN) 875-125 MG tablet Take 1 tablet by mouth 2 (two) times daily. 20 tablet 0  . anastrozole (ARIMIDEX) 1 MG tablet Take 1 mg by mouth daily.    [name] Kitchen BIKTARVY 50-200-25 MG TABS tablet TAKE 1 TABLET BY MOUTH DAILY 30 tablet 0  .  hydrochlorothiazide (HYDRODIURIL) 25 MG tablet TAKE 1 TABLET(25 MG) BY MOUTH DAILY 90 tablet 1  . MELATONIN PO Take 6 mg by mouth at bedtime.    . metoprolol succinate (TOPROL-XL) 25 MG 24 hr tablet Take 1 tablet (25 mg total) by mouth daily. 90 tablet 0  . potassium chloride SA (KLOR-CON) 20 MEQ tablet TAKE 1 TABLET(20 MEQ) BY MOUTH DAILY 90 tablet 1    ROS: Unable to obtain due to aphasia. Family did not endorse her having any complaints prior to onset of her stroke symptoms.   Physical Examination: Weight 53.7 kg.  HEENT-  [place]/AT  Lungs - Respirations unlabored Extremities - No edema  Neurologic Examination: Mental Status: Awake with decreased level of alertness. Dense expressive aphasia. Able to follow simple commands with right arm.  Cranial Nerves: II:  Blinks to threat on the right but not on the left. PERRL.  III,IV, VI: No ptosis. Rightward gaze deviation.  V,VII: Left facial droop.  VIII: Hearing intact to some simple commands IX,X: Unable to assess XI: Unable to assess.  XII: Does not protrude tongue to command.  Motor: LUE: Flaccid tone. 0/5 with no movement to command or noxious LLE 2/5 RUE 5/5 RLE 3/5 Sensory: Reacts to noxious, RUE, RLE and LLE but not LUE.  Deep Tendon Reflexes:  1+ bilateral brachioradialis and patellae Plantars:  Equivocal bilaterally Cerebellar: Not following commands for assessment Gait: Unable to assess  Results for orders placed or performed during the hospital encounter of 01/29/20 (from the past 48 hour(s))  CBG monitoring, ED     Status: Abnormal   Collection Time: 01/29/20  7:51 AM  Result Value Ref Range   Glucose-Capillary 141 (H) 70 - 99 mg/dL    Comment: Glucose reference range applies only to samples taken after fasting for at least 8 hours.  Protime-INR     Status: None   Collection Time: 01/29/20  7:54 AM  Result Value Ref Range   Prothrombin Time 14.9 11.4 - 15.2 seconds   INR 1.2 0.8 - 1.2    Comment: (NOTE) INR goal  varies based on device and disease states. Performed at [facility], [place] [address] [place], [place] [address]   APTT     Status: None   Collection Time: 01/29/20  7:54 AM  Result Value Ref Range   aPTT 28 24 - 36 seconds    Comment: Performed at [facility] [address] [place] [address]  CBC     Status: None   Collection Time: 01/29/20  7:54 AM  Result Value Ref Range   WBC 9.1 4.0 - 10.5 K/uL   RBC 4.47 3.87 - 5.11 MIL/uL   Hemoglobin 13.2 12.0 - 15.0 g/dL   HCT 42.3 36 - 46 %   MCV 94.6 80.0 - 100.0 fL   MCH 29.5 26.0 - 34.0 pg   MCHC 31.2 30.0 - 36.0 g/dL   RDW 13.5 11.5 - 15.5 %   Platelets 271 150 - 400 K/uL   nRBC 0.0 0.0 - 0.2 %    Comment: Performed at [facility], [place] [address], [place], [place] [address]  Differential     Status: None   Collection Time: 01/29/20  7:54 AM  Result Value Ref Range   Neutrophils Relative % 78 %   Neutro Abs 7.1 1.7 - 7.7 K/uL   Lymphocytes Relative 11 %   Lymphs Abs 1.0 0.7 - 4.0 K/uL   Monocytes Relative 8 %   Monocytes Absolute 0.8 0 - 1 K/uL   Eosinophils Relative 2 %   Eosinophils Absolute 0.2 0 - 0 K/uL   Basophils Relative 0 %   Basophils Absolute 0.0 0 - 0 K/uL   Immature Granulocytes 1 %   Abs Immature Granulocytes 0.06 0.00 - 0.07 K/uL    Comment: Performed at [facility] [address] [place],  [address]  I-stat chem 8, ED     Status: Abnormal   Collection Time: 01/29/20  7:59 AM  Result Value Ref Range   Sodium 140 135 - 145 mmol/L   Potassium 4.0 3.5 - 5.1 mmol/L   Chloride 101 98 - 111 mmol/L   BUN 32 (H) 8 - 23 mg/dL   Creatinine, Ser 1.60 (H) 0.44 - 1.00 mg/dL   Glucose, Bld 151 (H) 70 - 99 mg/dL    Comment: Glucose reference range applies only to samples taken after fasting for at least 8 hours.   Calcium, Ion 1.12 (L) 1.15 - 1.40 mmol/L   TCO2 25 22 - 32 mmol/L   Hemoglobin 13.6 12.0 - 15.0 g/dL   HCT 40.0 36 - 46 %   No results found.  Assessment: 79  y.o. female with HIV, HTN and DM presenting with acute onset of left hemiplegia, left facial droop, right eye deviation  and aphasia 1. Exam findings best localize to the right MCA territory 2. CT head with atrophy and chronic small vessel ischemic changes. No acute hemorrhage or hypodensity. ASPECTS 10.  3. CTA of head and neck: Emergent large vessel occlusion at the right M1 segment. No visible embolic source. There is atherosclerosis without flow limiting stenosis or ulceration of major vessels.  4. Stroke Risk Factors - HLD, HIV, HTN and DM 5. Of note, CTA also intersects portions of the lung parenchyma, revealing a left superior sulcus mass with large complex pleural effusion  that is malignant appearing. There is associated mediastinal adenopathy. 6. After comprehensive review of possible contraindications, she has no absolute contraindications to tPA administration. Patient is a tPA candidate. Discussed extensively the risks/benefits of tPA treatment vs. no treatment with the patient's daughter and son, including risks of hemorrhage and death with tPA administration versus worse overall outcomes on average in patients within tPA time window who are not administered tPA. The patient's aphasia precludes meaningful medical decision making on her part at this time. Overall benefits of tPA regarding long-term prognosis are felt to outweigh risks. The patient's son and daughter expressed understanding and wish to proceed with tPA. Telephone consent witnessed by Jinny Blossom, Stroke RN.  7. After tPA infusion was started, the patient did not improve significantly. Informed consent obtained from family to proceed with mechanical thrombectomy. Risks/benefits discussed included an approximately 50% likelihood of significant clinical improvement and approximately 10% chance of subarachnoid hemorrhage, with associated risk of death. Consent witnessed by Jinny Blossom, Stroke RN.   Plan: Acute right MCA stroke 1. Admitting to  Neuro ICU.  2. Post-tPA and VIR order set to include frequent neuro checks and BP management.  3. No antiplatelet medications or anticoagulants for at least 24 hours following tPA.  4. DVT prophylaxis with SCDs.  5. Telemetry monitoring 6. Will need to start antiplatelet therapy if follow up CT at 24 hours is negative for hemorrhagic conversion. 7. TTE.  8. NPO until passes swallow evaluation. 9. MRI brain  10. PT/OT/Speech.  11. Fasting lipid panel, HgbA1c  HIV Will need to continue her antiretroviral medications. On Biktarvy at home. However, Epic flags Biktarvy as contraindicated in this patient. Pharmacy has been consulted.  DM SSI  Possible lung mass Radiology has recommended CT of the chest and abdomen with contrast to further assess the left superior sulcus mass (ordered).   80 minutes spent in the emergent neurological evaluation and management of this critically ill patient  Electronically signed: Dr. Kerney Elbe 01/29/2020, 8:11 AM

## 2020-01-29 NOTE — Transfer of Care (Signed)
Immediate Anesthesia Transfer of Care Note  Patient: Amber Lopez  Procedure(s) Performed: IR WITH ANESTHESIA - CODE STROKE (N/A )  Patient Location: ICU  Anesthesia Type:General  Level of Consciousness: Patient remains intubated per anesthesia plan  Airway & Oxygen Therapy: Patient remains intubated per anesthesia plan and Patient placed on Ventilator (see vital sign flow sheet for setting)  Post-op Assessment: Report given to RN and Post -op Vital signs reviewed and stable  Post vital signs: Reviewed and stable  Last Vitals:  Vitals Value Taken Time  BP    Temp    Pulse    Resp    SpO2      Last Pain: There were no vitals filed for this visit.       Complications: No complications documented.

## 2020-01-29 NOTE — Progress Notes (Signed)
Patient ID: Amber Lopez, female   DOB: [birth year], 79 y.o.   MRN: [id number] INR 79 Y RT H F MRS 0 LSW 640 am  New onset aphasia,left hemiplegia and RT gaze deviation. CT brain No ICH ASPECTS 10  CTA occluded Rt MCA M1 seg. Endovascular treatment D/.W daughter and son on the phone. Procedure,reasons and alternatives reviewed. Risks of ICH of 10 %,worsening neuro condition,death and inability to revascularize discussed . They both expressed understanding and consented to the treatment. S.Flois Mctague MD

## 2020-01-29 NOTE — Telephone Encounter (Signed)
Patient's daughter called to let us know her mom has been admitted for a stroke.  She wants to make sure that her mother 1) continues her medication, 2) that her medication will continue after discharge, if sent to a rehab facility.  Will route to ID provider and pharmacists in [place]. Landis Gandy, RN

## 2020-01-29 NOTE — Procedures (Signed)
At 1430 Dr. Estanislado Pandy and Melchor Amour went to the room due to groin site bleeding.  Pressure held after sheath removal for 30 minutes using quick clot.  Pressure dressing applied.  No hematoma, no complications.  Distal pulses intact.

## 2020-01-29 NOTE — Anesthesia Procedure Notes (Signed)
Procedure Name: Intubation Date/Time: 01/29/2020 8:52 AM Performed by: Lavell Luster, CRNA Pre-anesthesia Checklist: Patient identified, Emergency Drugs available, Suction available, Patient being monitored and Timeout performed Patient Re-evaluated:Patient Re-evaluated prior to induction Oxygen Delivery Method: Ambu bag Preoxygenation: Pre-oxygenation with 100% oxygen Induction Type: IV induction, Rapid sequence and Cricoid Pressure applied Ventilation: Mask ventilation without difficulty Laryngoscope Size: Glidescope and 3 Grade View: Grade I Tube type: Subglottic suction tube Tube size: 7.0 mm Number of attempts: 1 Airway Equipment and Method: Stylet and Video-laryngoscopy Placement Confirmation: ETT inserted through vocal cords under direct vision,  breath sounds checked- equal and bilateral and CO2 detector Secured at: 21 cm Tube secured with: Tape Dental Injury: Teeth and Oropharynx as per pre-operative assessment

## 2020-01-29 NOTE — Progress Notes (Signed)
Pharmacy Consult: Biktarvy Evaluation  78 YOF who presented on 10/7 with acute stroke. The patient was on [place] PTA for management of her HIV. Pharmacy was consulted to evaluate this regimen due to [facility] flagging as contraindicated.   The flag is based off of the patient's renal function. Her baseline SCr appears to be around 1.3-1.5 however is currently up to 1.6. Epic calculates the CrCl to be around 23 ml/min and flags Biktarvy as "contraindicated" due to this. Her low weight is driving down her CrCl calculations and making it appear falsely low - when normalized her CrCl is closer to 30-35 ml/min.   Also of noting is that there has been additional evidence with using Biktravy at lower CrCl, including use in ESRD patients. It is safe to resume at this time with a continued watch of the patient's SCr. If it continues to rise significantly - will consider addressing alternatives.  Given that the patient is intubated with a NGT in place however the patient will need to be substituted to  [place] which can be crushed and given via tube in the short term.   Plan - Okay to resume PTA Biktarvy when able to take po - While intubated and NGT in place - will substitute with Descovy + Tivicay which can be crushed and given via tube.  Thank you for allowing pharmacy to be a part of this patient's care.  Alycia Rossetti, PharmD, BCPS Clinical Pharmacist Clinical phone for 01/29/2020: [phone] 01/29/2020 2:21 PM   **Pharmacist phone directory can now be found on [email or web address] (PW [email or web address]).  Listed under [place].

## 2020-01-29 NOTE — Anesthesia Preprocedure Evaluation (Signed)
Anesthesia Evaluation  Patient identified by MRN, date of birth, ID band Patient awake    Reviewed: Allergy & Precautions, NPO status , Patient's Chart, lab work & pertinent test results  Airway Mallampati: II  TM Distance: >3 FB Neck ROM: Full    Dental no notable dental hx. (+) Teeth Intact   Pulmonary neg pulmonary ROS,    Pulmonary exam normal breath sounds clear to auscultation       Cardiovascular hypertension, negative cardio ROS Normal cardiovascular exam Rhythm:Regular Rate:Normal     Neuro/Psych  Headaches, PSYCHIATRIC DISORDERS Depression Dementia    GI/Hepatic negative GI ROS, Neg liver ROS,   Endo/Other  negative endocrine ROSdiabetes  Renal/GU negative Renal ROS  negative genitourinary   Musculoskeletal  (+) Arthritis , Osteoarthritis,    Abdominal   Peds negative pediatric ROS (+)  Hematology negative hematology ROS (+)   Anesthesia Other Findings   Reproductive/Obstetrics negative OB ROS                             Anesthesia Physical Anesthesia Plan  ASA: IV and emergent  Anesthesia Plan: General   Post-op Pain Management:    Induction: Intravenous  PONV Risk Score and Plan: 3  Airway Management Planned: Oral ETT  Additional Equipment:   Intra-op Plan:   Post-operative Plan: Extubation in OR  Informed Consent:   Plan Discussed with: Anesthesiologist  Anesthesia Plan Comments: (  )        Anesthesia Quick Evaluation

## 2020-01-29 NOTE — Consult Note (Signed)
NAME:  Amber Lopez, MRN:  [id number], DOB:  16-Nov-1940, LOS: 0 ADMISSION DATE:  01/29/2020, CONSULTATION DATE:  10/7 REFERRING MD: Dr. Estanislado Pandy, CHIEF COMPLAINT: Left Sided Weakness  Brief History   79 y/o F, with HIV, admitted 10/7 with LVO of R M1 s/p tPA, neuro IR revascularization.  Returned to ICU post procedure on mechanical ventilation. Additional finding of left superior sulcus mass and complex effusion.   History of present illness   79 y/o F who presented to [facility] on 10/7 with acute onset left sided weakness, facial droop, aphasia and right eye deviation. She was last known normal at 0640 when her daughter assisted her with am medications.  The patient was in bed but reportedly normal at that time.  Her daughter left and returned home finding her mother with weakness on the left side and unable to speak.  EMS was activated and CODE STROKE was activated in the field.  On arrival to the ER the patient was documented to have left sided hemiparesis, left facial droop, non-verbal and right gaze deviation. Initial CT of the head was negative for acute findings.  CTA head/neck demonstrated  Past Medical History  Migraines  HTN HLD DM Depression  Arthritis  Breast Cancer  HIV   Significant Hospital Events   10/07 Admit with L hemiplegia, facial droop, aphasia with right eye deviation  Consults:     Procedures:  ETT 10/7 >> L Radial ALine 10/7 >>   R Femoral Sheath 10/7 >>   Significant Diagnostic Tests:  10/7 CT Head Code Stroke >> no acute finding, generalized atrophy  10/7 CTA Head/Neck >> emergent LVO at the right M1 segment, no visible embolic source, atherosclerosis without flow limiting stenosis or ulceration of major vessels, left superior sulcus mass with large complex pleural effusion, associated mediastinal adenopathy  10/7 CT ABD w/contrast >>  10/7 CT Chest w/contrast >>   Micro Data:  COVID 10/7 >> negative  Influenza A/B 10/7 >> negative  Antimicrobials:     Interim history/subjective:  RN reports labile BP, currently requiring neosynephrine   Objective   Blood pressure (!) 54/40, pulse 88, weight 53.7 kg, SpO2 100 %.        Intake/Output Summary (Last 24 hours) at 01/29/2020 1307 Last data filed at 01/29/2020 1240 Gross per 24 hour  Intake 850 ml  Output 25 ml  Net 825 ml   Filed Weights   01/29/20 0700  Weight: 53.7 kg    Examination: General: cachectic, elderly adult female lying in bed in NAD on vent HEENT: MM pink/moist, ETT, left facial droop, cataracts, pupils 75mm sluggishly reactive Neuro: sedate on vent, +cough with deep suction  CV: s1s2 RRR, no m/r/g PULM: non-labored on vent, lungs bilaterally clear GI: soft, bsx4 active  Extremities: warm/dry, no edema  Skin: no rashes or lesions.  Right femoral arterial sheath with small amt bleeding at site.  No hematoma / soft.   Resolved Hospital Problem list     Assessment & Plan:   Ischemic Stroke in setting of Large Vessel Occlusion of Right M1 Segment No visible embolic source on CTA head/neck.  Stroke risk factors include HIV, HLD, HTN, DM and possible malignancy with large left superior sulcus mass and complex pleural effusion.  S/P tPA and endovascular revascularization. No ICH post procedure on CT, contrast stain in the right basal ganglia.   -post operative / stroke care per Neurology / Neuro IR  -monitor sheath site  -assess ECHO, Hgb A1c -follow up  CT head 10/8 1100 am  -follow up MRI at 0000 10/8 -bleeding / seizure precautions  -SBP goal 120-140 -frequent neuro exams -assess EKG, triglycerides   Hypotension / Hypertension  Hx HTN, HLD Labile BP during / post procedure -peripheral neosynephrine for BP goals as above -1L LR bolus now -hold home HCTZ, Metoprolol   Acute Respiratory Insufficiency in setting of R M1 CVA -PRVC 8cc/kg -wean PEEP / FiO2 for sats > 90% -assess CXR, ABG now post arrival to ICU  -assess CXR in am  -VAP prevention measures   -likely no extubation until imaging completed in am if meets criteria   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 Hx of Breast Cancer  -hold home anastrozole   Depression / Anxiety  -supportive care  -hold home melatonin   Best practice:  Diet: NPO Pain/Anxiety/Delirium protocol (if indicated): Ordered VAP protocol (if indicated): In place  DVT prophylaxis: SCD's  GI prophylaxis: PPI  Glucose control: n/a  Mobility: Bed Rest  Code Status: Full Code  Family Communication: per primary  Disposition: ICU   Labs   CBC: Recent Labs  Lab 01/29/20 0754 01/29/20 0759  WBC 9.1  --   NEUTROABS 7.1  --   HGB 13.2 13.6  HCT 42.3 40.0  MCV 94.6  --   PLT 271  --     Basic Metabolic Panel: Recent Labs  Lab 01/29/20 0754 01/29/20 0759  NA 141 140  K 4.3 4.0  CL 99 101  CO2 25  --   GLUCOSE 153* 151*  BUN 29* 32*  CREATININE 1.69* 1.60*  CALCIUM 9.5  --    GFR: Estimated Creatinine Clearance: 22.5 mL/min (A) (by C-G formula based on SCr of 1.6 mg/dL (H)). Recent Labs  Lab 01/29/20 0754  WBC 9.1    Liver Function Tests: Recent Labs  Lab 01/29/20 0754  AST 21  ALT 12  ALKPHOS 80  BILITOT 1.0  PROT 7.7  ALBUMIN 2.8*   No results for input(s): LIPASE, AMYLASE in the last 168 hours. No results for input(s): AMMONIA in the last 168 hours.  ABG    Component Value Date/Time   TCO2 25 01/29/2020 0759     Coagulation Profile: Recent Labs  Lab 01/29/20 0754  INR 1.2    Cardiac Enzymes: No results for input(s): CKTOTAL, CKMB, CKMBINDEX,  TROPONINI in the last 168 hours.  HbA1C: No results found for: HGBA1C  CBG: Recent Labs  Lab 01/29/20 0751  GLUCAP 141*    Review of Systems:   Unable to complete as patient is altered on mechanical ventilation.   Past Medical History  She,  has a past medical history of Arthritis, Depression, Diabetes mellitus without complication ([place]), Eating disorder, Hyperlipidemia, Hypertension, and Migraines.   Surgical History    Past Surgical History:  Procedure Laterality Date  . BREAST BIOPSY    . BREAST LUMPECTOMY Right    Years ago / Pt's daughter thinks it was cancer     Social History   reports that she has  never smoked. She has never used smokeless tobacco. She reports that she does not drink alcohol and does not use drugs.   Family History   Her family history includes Breast cancer (age of onset: 47) in her sister.   Allergies Allergies  Allergen Reactions  . Sulfa Antibiotics Swelling and Rash     Home Medications  Prior to Admission medications   Medication Sig Start Date End Date Taking? Authorizing Provider  amoxicillin-clavulanate (AUGMENTIN) 875-125 MG tablet Take 1 tablet by mouth 2 (two) times daily. 01/26/20   Billie Ruddy, MD  anastrozole (ARIMIDEX) 1 MG tablet Take 1 mg by mouth daily.    [provider]  BIKTARVY 50-200-25 MG TABS tablet TAKE 1 TABLET BY MOUTH DAILY 01/27/20   [place]en Circle, FNP  hydrochlorothiazide (HYDRODIURIL) 25 MG tablet TAKE 1 TABLET(25 MG) BY MOUTH DAILY 01/26/20   Billie Ruddy, MD  MELATONIN PO Take 6 mg by mouth at bedtime.    [provider]  metoprolol succinate (TOPROL-XL) 25 MG 24 hr tablet Take 1 tablet (25 mg total) by mouth daily. 12/31/19   Billie Ruddy, MD  potassium chloride SA (KLOR-CON) 20 MEQ tablet TAKE 1 TABLET(20 MEQ) BY MOUTH DAILY 01/26/20   Billie Ruddy, MD     Critical care time: 53 minutes     Noe Gens, MSN, NP-C, AGACNP-BC [facility]  01/29/2020, 1:07 PM   Please see [email or web address] for pager details.

## 2020-01-30 ENCOUNTER — Inpatient Hospital Stay (HOSPITAL_COMMUNITY): Payer: Medicare Other

## 2020-01-30 ENCOUNTER — Encounter (HOSPITAL_COMMUNITY): Payer: Self-pay | Admitting: Radiology

## 2020-01-30 DIAGNOSIS — I6601 Occlusion and stenosis of right middle cerebral artery: Secondary | ICD-10-CM | POA: Diagnosis not present

## 2020-01-30 DIAGNOSIS — I6389 Other cerebral infarction: Secondary | ICD-10-CM

## 2020-01-30 DIAGNOSIS — I6340 Cerebral infarction due to embolism of unspecified cerebral artery: Secondary | ICD-10-CM | POA: Diagnosis not present

## 2020-01-30 DIAGNOSIS — J9622 Acute and chronic respiratory failure with hypercapnia: Secondary | ICD-10-CM

## 2020-01-30 DIAGNOSIS — R918 Other nonspecific abnormal finding of lung field: Secondary | ICD-10-CM

## 2020-01-30 DIAGNOSIS — Z978 Presence of other specified devices: Secondary | ICD-10-CM | POA: Diagnosis not present

## 2020-01-30 DIAGNOSIS — I639 Cerebral infarction, unspecified: Secondary | ICD-10-CM

## 2020-01-30 DIAGNOSIS — J90 Pleural effusion, not elsewhere classified: Secondary | ICD-10-CM

## 2020-01-30 DIAGNOSIS — J9621 Acute and chronic respiratory failure with hypoxia: Secondary | ICD-10-CM

## 2020-01-30 DIAGNOSIS — Z21 Asymptomatic human immunodeficiency virus [HIV] infection status: Secondary | ICD-10-CM

## 2020-01-30 DIAGNOSIS — I63511 Cerebral infarction due to unspecified occlusion or stenosis of right middle cerebral artery: Secondary | ICD-10-CM | POA: Diagnosis not present

## 2020-01-30 DIAGNOSIS — D62 Acute posthemorrhagic anemia: Secondary | ICD-10-CM

## 2020-01-30 LAB — CBC
HCT: 20.7 % — ABNORMAL LOW (ref 36.0–46.0)
HCT: 31.3 % — ABNORMAL LOW (ref 36.0–46.0)
Hemoglobin: 6.6 g/dL — CL (ref 12.0–15.0)
Hemoglobin: 10.2 g/dL — ABNORMAL LOW (ref 12.0–15.0)
MCH: 29.7 pg (ref 26.0–34.0)
MCH: 29.2 pg (ref 26.0–34.0)
MCHC: 31.9 g/dL (ref 30.0–36.0)
MCHC: 32.6 g/dL (ref 30.0–36.0)
MCV: 93.2 fL (ref 80.0–100.0)
MCV: 89.7 fL (ref 80.0–100.0)
Platelets: 196 10*3/uL (ref 150–400)
Platelets: 160 10*3/uL (ref 150–400)
RBC: 2.22 MIL/uL — ABNORMAL LOW (ref 3.87–5.11)
RBC: 3.49 MIL/uL — ABNORMAL LOW (ref 3.87–5.11)
RDW: 13.8 % (ref 11.5–15.5)
RDW: 14.6 % (ref 11.5–15.5)
WBC: 9.7 10*3/uL (ref 4.0–10.5)
WBC: 8.6 10*3/uL (ref 4.0–10.5)
nRBC: 0 % (ref 0.0–0.2)
nRBC: 0 % (ref 0.0–0.2)

## 2020-01-30 LAB — ECHOCARDIOGRAM COMPLETE
AR max vel: 1.93 cm2
AV Area VTI: 2.25 cm2
AV Area mean vel: 1.97 cm2
AV Mean grad: 2 mmHg
AV Peak grad: 4.5 mmHg
Ao pk vel: 1.06 m/s
Area-P 1/2: 3.08 cm2
Height: 58 in
S' Lateral: 1.5 cm
Weight: 1894.19 oz

## 2020-01-30 LAB — GLUCOSE, CAPILLARY
Glucose-Capillary: 150 mg/dL — ABNORMAL HIGH (ref 70–99)
Glucose-Capillary: 143 mg/dL — ABNORMAL HIGH (ref 70–99)
Glucose-Capillary: 118 mg/dL — ABNORMAL HIGH (ref 70–99)
Glucose-Capillary: 119 mg/dL — ABNORMAL HIGH (ref 70–99)
Glucose-Capillary: 135 mg/dL — ABNORMAL HIGH (ref 70–99)
Glucose-Capillary: 132 mg/dL — ABNORMAL HIGH (ref 70–99)

## 2020-01-30 LAB — CBC WITH DIFFERENTIAL/PLATELET
Abs Immature Granulocytes: 0.14 10*3/uL — ABNORMAL HIGH (ref 0.00–0.07)
Basophils Absolute: 0 10*3/uL (ref 0.0–0.1)
Basophils Relative: 0 %
Eosinophils Absolute: 0.1 10*3/uL (ref 0.0–0.5)
Eosinophils Relative: 1 %
HCT: 24.8 % — ABNORMAL LOW (ref 36.0–46.0)
Hemoglobin: 8.2 g/dL — ABNORMAL LOW (ref 12.0–15.0)
Immature Granulocytes: 1 %
Lymphocytes Relative: 8 %
Lymphs Abs: 0.8 10*3/uL (ref 0.7–4.0)
MCH: 30.6 pg (ref 26.0–34.0)
MCHC: 33.1 g/dL (ref 30.0–36.0)
MCV: 92.5 fL (ref 80.0–100.0)
Monocytes Absolute: 0.9 10*3/uL (ref 0.1–1.0)
Monocytes Relative: 8 %
Neutro Abs: 8.7 10*3/uL — ABNORMAL HIGH (ref 1.7–7.7)
Neutrophils Relative %: 82 %
Platelets: 261 10*3/uL (ref 150–400)
RBC: 2.68 MIL/uL — ABNORMAL LOW (ref 3.87–5.11)
RDW: 13.7 % (ref 11.5–15.5)
WBC: 10.6 10*3/uL — ABNORMAL HIGH (ref 4.0–10.5)
nRBC: 0 % (ref 0.0–0.2)

## 2020-01-30 LAB — POCT I-STAT 7, (LYTES, BLD GAS, ICA,H+H)
Acid-base deficit: 7 mmol/L — ABNORMAL HIGH (ref 0.0–2.0)
Bicarbonate: 17.3 mmol/L — ABNORMAL LOW (ref 20.0–28.0)
Calcium, Ion: 1.05 mmol/L — ABNORMAL LOW (ref 1.15–1.40)
HCT: 19 % — ABNORMAL LOW (ref 36.0–46.0)
Hemoglobin: 6.5 g/dL — CL (ref 12.0–15.0)
O2 Saturation: 99 %
Patient temperature: 97.3
Potassium: 3 mmol/L — ABNORMAL LOW (ref 3.5–5.1)
Sodium: 145 mmol/L (ref 135–145)
TCO2: 18 mmol/L — ABNORMAL LOW (ref 22–32)
pCO2 arterial: 26.5 mmHg — ABNORMAL LOW (ref 32.0–48.0)
pH, Arterial: 7.419 (ref 7.350–7.450)
pO2, Arterial: 143 mmHg — ABNORMAL HIGH (ref 83.0–108.0)

## 2020-01-30 LAB — SODIUM, URINE, RANDOM: Sodium, Ur: <10 mmol/L

## 2020-01-30 LAB — BASIC METABOLIC PANEL
Anion gap: 12 (ref 5–15)
BUN: 27 mg/dL — ABNORMAL HIGH (ref 8–23)
CO2: 17 mmol/L — ABNORMAL LOW (ref 22–32)
Calcium: 7.4 mg/dL — ABNORMAL LOW (ref 8.9–10.3)
Chloride: 112 mmol/L — ABNORMAL HIGH (ref 98–111)
Creatinine, Ser: 1.55 mg/dL — ABNORMAL HIGH (ref 0.44–1.00)
GFR calc non Af Amer: 32 mL/min — ABNORMAL LOW (ref >60)
Glucose, Bld: 140 mg/dL — ABNORMAL HIGH (ref 70–99)
Potassium: 3.1 mmol/L — ABNORMAL LOW (ref 3.5–5.1)
Sodium: 141 mmol/L (ref 135–145)

## 2020-01-30 LAB — URINALYSIS, ROUTINE W REFLEX MICROSCOPIC
Bilirubin Urine: NEGATIVE
Glucose, UA: NEGATIVE mg/dL
Hgb urine dipstick: NEGATIVE
Ketones, ur: NEGATIVE mg/dL
Nitrite: NEGATIVE
Protein, ur: NEGATIVE mg/dL
Specific Gravity, Urine: >1.046 — ABNORMAL HIGH (ref 1.005–1.030)
pH: 5 (ref 5.0–8.0)

## 2020-01-30 LAB — RAPID URINE DRUG SCREEN, HOSP PERFORMED
Amphetamines: NOT DETECTED
Barbiturates: NOT DETECTED
Benzodiazepines: NOT DETECTED
Cocaine: NOT DETECTED
Opiates: NOT DETECTED
Tetrahydrocannabinol: NOT DETECTED

## 2020-01-30 LAB — LIPID PANEL
Cholesterol: 119 mg/dL (ref 0–200)
HDL: 16 mg/dL — ABNORMAL LOW (ref >40)
LDL Cholesterol: 62 mg/dL (ref 0–99)
Total CHOL/HDL Ratio: 7.4 RATIO
Triglycerides: 207 mg/dL — ABNORMAL HIGH (ref <150)
VLDL: 41 mg/dL — ABNORMAL HIGH (ref 0–40)

## 2020-01-30 LAB — DIC (DISSEMINATED INTRAVASCULAR COAGULATION)PANEL
D-Dimer, Quant: 13.14 ug/mL-FEU — ABNORMAL HIGH (ref 0.00–0.50)
Fibrinogen: 195 mg/dL — ABNORMAL LOW (ref 210–475)
INR: 1.5 — ABNORMAL HIGH (ref 0.8–1.2)
Platelets: 213 10*3/uL (ref 150–400)
Prothrombin Time: 17.2 seconds — ABNORMAL HIGH (ref 11.4–15.2)
Smear Review: NONE SEEN
aPTT: 32 seconds (ref 24–36)

## 2020-01-30 LAB — PREPARE RBC (CROSSMATCH)

## 2020-01-30 LAB — CREATININE, URINE, RANDOM: Creatinine, Urine: 136.28 mg/dL

## 2020-01-30 LAB — ABO/RH: ABO/RH(D): B POS

## 2020-01-30 LAB — TRIGLYCERIDES: Triglycerides: 206 mg/dL — ABNORMAL HIGH (ref <150)

## 2020-01-30 MED ORDER — POLYETHYLENE GLYCOL 3350 17 G PO PACK
17.0000 g | PACK | Freq: Every day | ORAL | Status: DC
  Administered 2020-01-31 – 2020-02-27 (×6): 17 g
  Filled 2020-01-30 (×8): qty 1

## 2020-01-30 MED ORDER — SODIUM CHLORIDE 0.9 % IV BOLUS
500.0000 mL | Freq: Once | INTRAVENOUS | Status: AC
  Administered 2020-01-30: 500 mL via INTRAVENOUS

## 2020-01-30 MED ORDER — NICARDIPINE HCL IN NACL 20-0.86 MG/200ML-% IV SOLN: 3.0000 mg/h | INTRAVENOUS | Status: DC

## 2020-01-30 MED ORDER — ASPIRIN 300 MG RE SUPP: 300.0000 mg | Freq: Every day | RECTAL | Status: DC

## 2020-01-30 MED ORDER — SODIUM CHLORIDE 0.9 % IV SOLN: INTRAVENOUS | Status: DC

## 2020-01-30 MED ORDER — HEPARIN SODIUM (PORCINE) 5000 UNIT/ML IJ SOLN
5000.0000 [IU] | Freq: Two times a day (BID) | INTRAMUSCULAR | Status: DC
  Administered 2020-01-30 – 2020-02-03 (×8): 5000 [IU] via SUBCUTANEOUS
  Filled 2020-01-30 (×8): qty 1

## 2020-01-30 MED ORDER — SODIUM CHLORIDE 0.9% IV SOLUTION: Freq: Once | INTRAVENOUS | Status: AC

## 2020-01-30 MED ORDER — NICARDIPINE HCL IN NACL 20-0.86 MG/200ML-% IV SOLN
INTRAVENOUS | Status: AC
  Filled 2020-01-30: qty 200

## 2020-01-30 MED ORDER — SODIUM CHLORIDE 0.9 % IV BOLUS
1000.0000 mL | Freq: Once | INTRAVENOUS | Status: AC
  Administered 2020-01-30: 1000 mL via INTRAVENOUS

## 2020-01-30 MED ORDER — POTASSIUM CHLORIDE 20 MEQ/15ML (10%) PO SOLN
40.0000 meq | Freq: Once | ORAL | Status: AC
  Administered 2020-01-30: 40 meq
  Filled 2020-01-30: qty 30

## 2020-01-30 MED ORDER — DOLUTEGRAVIR SODIUM 50 MG PO TABS
50.0000 mg | ORAL_TABLET | Freq: Every day | ORAL | Status: DC
  Administered 2020-01-30 – 2020-03-03 (×34): 50 mg via ORAL
  Filled 2020-01-30 (×34): qty 1

## 2020-01-30 MED ORDER — EMTRICITABINE-TENOFOVIR AF 200-25 MG PO TABS
1.0000 | ORAL_TABLET | Freq: Every day | ORAL | Status: DC
  Administered 2020-01-30 – 2020-03-03 (×33): 1
  Filled 2020-01-30 (×37): qty 1

## 2020-01-30 MED ORDER — HEPARIN SODIUM (PORCINE) 5000 UNIT/ML IJ SOLN
5000.0000 [IU] | Freq: Two times a day (BID) | INTRAMUSCULAR | Status: DC
  Administered 2020-01-30: 5000 [IU] via SUBCUTANEOUS
  Filled 2020-01-30: qty 1

## 2020-01-30 MED ORDER — DOCUSATE SODIUM 50 MG/5ML PO LIQD
100.0000 mg | Freq: Two times a day (BID) | ORAL | Status: DC
  Administered 2020-01-30 – 2020-03-03 (×35): 100 mg
  Filled 2020-01-30 (×38): qty 10

## 2020-01-30 MED ORDER — STROKE: EARLY STAGES OF RECOVERY BOOK
Status: AC
  Filled 2020-01-30: qty 1

## 2020-01-30 MED ORDER — ASPIRIN 325 MG PO TABS: 325.0000 mg | ORAL_TABLET | Freq: Every day | ORAL | Status: DC

## 2020-01-30 MED ORDER — POTASSIUM CHLORIDE 20 MEQ/15ML (10%) PO SOLN
20.0000 meq | Freq: Two times a day (BID) | ORAL | Status: DC
  Administered 2020-01-30 – 2020-02-01 (×5): 20 meq
  Filled 2020-01-30 (×5): qty 15

## 2020-01-30 MED ORDER — IOHEXOL 300 MG/ML  SOLN
75.0000 mL | Freq: Once | INTRAMUSCULAR | Status: AC | PRN
  Administered 2020-01-30: 75 mL via INTRAVENOUS

## 2020-01-30 MED ORDER — PERFLUTREN LIPID MICROSPHERE
1.0000 mL | INTRAVENOUS | Status: AC | PRN
  Administered 2020-01-30: 2 mL via INTRAVENOUS
  Filled 2020-01-30: qty 10

## 2020-01-30 NOTE — Progress Notes (Signed)
NAME:  Amber Lopez, MRN:  [id number], DOB:  Feb 09, 1941, LOS: 1 ADMISSION DATE:  01/29/2020, CONSULTATION DATE:  10/7 REFERRING MD: Dr. Estanislado Pandy, CHIEF COMPLAINT: Left Sided Weakness  Brief History   79 y/o F, with HIV, admitted 10/7 with LVO of R M1 s/p tPA, neuro IR revascularization.  Returned to ICU post procedure on mechanical ventilation. Additional finding of left superior sulcus mass and complex effusion.   History of present illness   79 y/o F who presented to [facility] on 10/7 with acute onset left sided weakness, facial droop, aphasia and right eye deviation. She was last known normal at 0640 when her daughter assisted her with am medications.  The patient was in bed but reportedly normal at that time.  Her daughter left and returned home finding her mother with weakness on the left side and unable to speak.  EMS was activated and CODE STROKE was activated in the field.  On arrival to the ER the patient was documented to have left sided hemiparesis, left facial droop, non-verbal and right gaze deviation. Initial CT of the head was negative for acute findings.  CTA head/neck demonstrated  Past Medical History  HIV/AIDS, HTN, HLD, DM, Depression , Arthritis, Breast Cancer   Significant Hospital Events   10/07 Admit with L hemiplegia, facial droop, aphasia with right eye deviation  Consults:  Neurology, IR   Procedures:  ETT 10/7 >> L Radial ALine 10/7 >>   R Femoral Sheath 10/7 >>   Significant Diagnostic Tests:  10/7 CT Head Code Stroke >> no acute finding, generalized atrophy  10/7 CTA Head/Neck >> emergent LVO at the right M1 segment, no visible embolic source, atherosclerosis without flow limiting stenosis or ulceration of major vessels, left superior sulcus mass with large complex pleural effusion, associated mediastinal adenopathy  10/7 CT ABD w/contrast >>  10/7 CT Chest w/contrast >>   Micro Data:  COVID 10/7 >> negative  Influenza A/B 10/7 >> negative  Antimicrobials:   N/A  Interim history/subjective:   Overnight, patient did have some labile blood pressures with oral care that require frequent titration of Neo. She also did have some bleeding from femoral sheath that has since resolved. This morning, patient is minimally responsive to painful stimuli only.   Objective   Blood pressure 139/61, pulse 70, temperature 97.6 F (36.4 C), temperature source Oral, resp. rate (!) 28, height 4\' 10"  (1.473 m), weight 53.7 kg, SpO2 99 %.    Vent Mode: PRVC FiO2 (%):  [40 %-50 %] 40 % Set Rate:  [12 bmp-17 bmp] 16 bmp Vt Set:  [330 mL-400 mL] 330 mL PEEP:  [5 cmH20] 5 cmH20 Plateau Pressure:  [16 cmH20-21 cmH20] 21 cmH20   Intake/Output Summary (Last 24 hours) at 01/30/2020 0828 Last data filed at 01/30/2020 0600 Gross per 24 hour  Intake 3713.78 ml  Output 650 ml  Net 3063.78 ml   Filed Weights   01/29/20 0700  Weight: 53.7 kg    Examination:  General: cachectic, elderly adult female. No acute distress.  HEENT: MM pink/moist, ETT, PERRL Neuro: Sedated this morning but responds to sternal rub. Moving right side extremities spontaneously. Minimal movement of left sided extremities.  CV: Regular rate and rhythm. No murmurs. Normal S1S2 PULM: Patient is on ventilator. Breathe sounds clear to auscultation bilaterally.  GI: Soft with normoactive bowel sounds.   Extremities: Warm and dry. No edema of the lower extremities.  Skin: Right femoral access site clean and dry without active bleeding. No  surrounding ecchymosis. No other petechiae, purpura or ecchymosis.   Resolved Hospital Problem list   N/A  Assessment & Plan:   # Ischemic Stroke in setting of Large Vessel Occlusion of Right M1 Segment - S/P tPA (~26 hours ago) and endovascular revascularization. No ICH post procedure on CT, contrast stain in the right basal ganglia.   - Neurology and IR following - MRI this morning shows multifocal infarct with mild petechial hemorrhage with small left  cerebellar infarct. This is concerning for an embolic source. No ICH noted on MRI this AM.  - TTE pending - Continue statin with goal LDL < 62, patient is at goal - Glycemic control with goal A1c < 7.0% - DAPT per Neurology, currently held in light of acute bleeding.  - SBP goal 120 -140 - Frequent neuro exams  # Acute Blood Loss Anemia  - Hemoglobin decrease from 13.2 >> 8.2 (this AM) >> 6.5 (this AM) - in light of TPA, thrombectomy and bleeding from femoral sheath.  - Concern for retroperitoneal bleed - CT abdomen/pelvis w/ contrast already ordered  - Coagulopathy panel pending - Aspirin and Heparin held until stabilization. Will restart if CT a/p negative  - 2 units of pRBCs ordered with 2 units to keep ahead - CBC 2 hours post transfusion   # Labile Hypertension  - Labile BP during / post procedure - Continues to be labile overnight  - Peripheral neosynephrine for BP goals as above.  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 High likelihood of malignancy - CT chest and abdomen w/ contrast pending  - Will likely require thoracentesis for diagnostics, however will defer till acute bleed improves, possibly today or tomorrow - May need FOB vs CT guided FNA for tissue sampling   # Acute Kidney Injury  - Creatinine on admission 1.69 with some improvement today to 1.55.  - UOP has decreased overnight to 0.7 mL/kg/hr. Foley being replaced for strict output monitoring.  - BUN/Cr ratio = 17.  - Will bolus fluids to monitor for improvement. S/p 1 L NS this AM.   - She is certainly at risk for both pre-renal and intra-renal causes. Will send Urinalysis + urine Na and Cr for further analysis.   # HIV/AIDS - Restarted Descovy   - CD4 count ordered and pending  # T2DM  - A1c of 6.5%  - Considering adding SSI if glucose consistently >180  # At Risk Malnutrition  - Will consider starting tube feeds once patient is stabilized.   # Hx of Breast Cancer  - Hold home anastrozole   # Hypokalemia - Monitor daily - Replenish PRN. Will receive 60 mEq today.   Best practice:  Diet: NPO Pain/Anxiety/Delirium protocol (if indicated): Ordered VAP protocol (if indicated): In place  DVT prophylaxis: SCD's  GI prophylaxis: PPI  Glucose control: n/a  Mobility: Bed Rest  Code Status: Full Code  Family Communication: per primary  Disposition: ICU   Labs   CBC: Recent Labs  Lab 01/29/20 0754 01/29/20 0759 01/29/20 1425 01/29/20 1637 01/30/20 0500  WBC 9.1  --   --   --  10.6*  NEUTROABS 7.1  --   --   --  8.7*  HGB 13.2 13.6 8.2* 9.2* 8.2*  HCT 42.3 40.0 24.0* 27.0*  24.8*  MCV 94.6  --   --   --  92.5  PLT 271  --   --   --  542    Basic Metabolic Panel: Recent Labs  Lab 01/29/20 0754 01/29/20 0759 01/29/20 1425 01/29/20 1637 01/30/20 0500  NA 141 140 138 140 141  K 4.3 4.0 3.3* 3.5 3.1*  CL 99 101  --   --  112*  CO2 25  --   --   --  17*  GLUCOSE 153* 151*  --   --  140*  BUN 29* 32*  --   --  27*  CREATININE 1.69* 1.60*  --   --  1.55*  CALCIUM 9.5  --   --   --  7.4*   GFR: Estimated Creatinine Clearance: 21.4 mL/min (A) (by C-G formula based on SCr of 1.55 mg/dL (H)). Recent Labs  Lab 01/29/20 0754 01/30/20 0500  WBC 9.1 10.6*    Liver Function Tests: Recent Labs  Lab 01/29/20 0754  AST 21  ALT 12  ALKPHOS 80  BILITOT 1.0  PROT 7.7  ALBUMIN 2.8*   No results for input(s): LIPASE, AMYLASE in the last 168 hours. No results for input(s): AMMONIA in the last 168 hours.  ABG    Component Value Date/Time   PHART 7.354 01/29/2020 1637   PCO2ART 33.6 01/29/2020 1637   PO2ART 131 (H) 01/29/2020 1637   HCO3 18.7 (L) 01/29/2020 1637   TCO2 20 (L) 01/29/2020 1637   ACIDBASEDEF 6.0 (H) 01/29/2020 1637   O2SAT 99.0 01/29/2020 1637      Coagulation Profile: Recent Labs  Lab 01/29/20 0754  INR 1.2    Cardiac Enzymes: No results for input(s): CKTOTAL, CKMB, CKMBINDEX, TROPONINI in the last 168 hours.  HbA1C: Hgb A1c MFr Bld  Date/Time Value Ref Range Status  01/29/2020 04:22 PM 6.5 (H) 4.8 - 5.6 % Final    Comment:    (NOTE) Pre diabetes:          5.7%-6.4%  Diabetes:              >6.4%  Glycemic control for   <7.0% adults with diabetes     CBG: Recent Labs  Lab 01/29/20 1612 01/29/20 1917 01/29/20 2319 01/30/20 0326 01/30/20 0757  GLUCAP 226* 224* 169* 150* 143*    Review of Systems:   Unable to complete as patient is altered on mechanical ventilation.   Past Medical History  She,  has a past medical history of Arthritis, Depression, Diabetes mellitus without complication ([place]), Eating disorder, Hyperlipidemia, Hypertension, and Migraines.   Surgical History    Past Surgical History:  Procedure Laterality Date  . BREAST BIOPSY    . BREAST LUMPECTOMY Right    Years ago / Pt's daughter thinks it was cancer     Social History   reports that she has never smoked. She has never used smokeless tobacco. She reports that she does not drink alcohol and does not use drugs.   Family History   Her family history includes Breast cancer (age of onset: 68) in her sister.   Allergies Allergies  Allergen Reactions  . Sulfa Antibiotics Swelling and Rash     Home Medications  Prior to Admission medications   Medication Sig Start Date End Date Taking? Authorizing Provider  amoxicillin-clavulanate (AUGMENTIN) 875-125 MG tablet Take 1 tablet by mouth 2 (two) times daily. 01/26/20   Billie Ruddy, MD  anastrozole (ARIMIDEX) 1 MG tablet Take 1 mg by  mouth daily.    [provider]  BIKTARVY 50-200-25 MG TABS tablet TAKE 1 TABLET BY MOUTH DAILY 01/27/20   Golden Circle, FNP  hydrochlorothiazide (HYDRODIURIL) 25 MG tablet TAKE 1 TABLET(25 MG) BY MOUTH DAILY 01/26/20   Billie Ruddy, MD   MELATONIN PO Take 6 mg by mouth at bedtime.    [provider]  metoprolol succinate (TOPROL-XL) 25 MG 24 hr tablet Take 1 tablet (25 mg total) by mouth daily. 12/31/19   Billie Ruddy, MD  potassium chloride SA (KLOR-CON) 20 MEQ tablet TAKE 1 TABLET(20 MEQ) BY MOUTH DAILY 01/26/20   Billie Ruddy, MD    Dr. Jose Persia Internal Medicine PGY-2  Pager: [phone] After 5pm on weekdays and 1pm on weekends: On Call pager [phone]  01/30/2020, 8:30 AM

## 2020-01-30 NOTE — Progress Notes (Signed)
SLP Cancellation Note  Patient Details Name: VERDIA BOLT MRN: [id number] DOB: 09/11/1940   Cancelled treatment:       Reason Eval/Treat Not Completed: Patient not medically ready. Pt intubated. Will f/u for cognitive linguistic eval when appropriate.   Herbie Baltimore, MA CCC-SLP  Acute Rehabilitation Services Pager [phone] Office [phone]   Lynann Beaver 01/30/2020, 8:07 AM

## 2020-01-30 NOTE — Progress Notes (Signed)
Made I Charleen Kirks MD aware of low urine output; bolus ordered. 1300 second bolus ordered for continued low urine output however MD asked to hold on bolus at this time d/t rising BP and Patient receiving blood products; neo also now off.   Patient to CT 1330. SBP goal now 120-160.  UA resulted; 1615 1L NS bolus given at this time per MD I. Basaraba.  Right pedal pulse faint by doppler at this time; Arlean Hopping MD made aware with no orders (as well as J. Erlinda Hong MD and Leodis Binet MD).

## 2020-01-30 NOTE — Telephone Encounter (Signed)
Thank you so much, [name]!

## 2020-01-30 NOTE — Progress Notes (Signed)
RT assisted with transport from 4N19 to CT. Pt tolerated well with SVS.

## 2020-01-30 NOTE — Progress Notes (Signed)
Critical value, Hb 6.5, reported to RN of patient along with MD.

## 2020-01-30 NOTE — Progress Notes (Signed)
Initial Nutrition Assessment  DOCUMENTATION CODES:   Not applicable  INTERVENTION:   Recommend initiation of TF within 24 hours if unable to extubate  Tube Feeding via OG: Osmolite 1.2 at 50 ml/hr Pro-Source TF 45 mL daily Provides 78 g of protein, 1480 kcals, 972 mL of free water  TF regimen and propofol at current rate providing 1736 total kcal/day    NUTRITION DIAGNOSIS:   Inadequate oral intake related to acute illness as evidenced by NPO status.  GOAL:   Patient will meet greater than or equal to 90% of their needs  MONITOR:   Vent status, Weight trends, TF tolerance, Skin, Labs  REASON FOR ASSESSMENT:   Ventilator    ASSESSMENT:   79 yo female presents with left sided weakness, facial droop, aphasia and right eye deviation and  admitted with ischemic stoke insetting of large vessel occlusion of right MCA M1 segment requiring revascularization, acute blood loss anemia with concern of retroperitoneal bleed, respiratory failure requiring intubation. PMH includes HIV, HTN, HLD, DM, depression, hx of breast cancer   10/07 Admitted, Intubated, Revascularization of R MCA M1 occlusion 10/08 MRI multifocal infarct with mild petechial hemorrhage with small L cerebella infarct  Noted CT abdomen pending, concern for retroperitoneal bleed; CT chest also pending, with new discovery of large left superior sulclus mass with complex pleural effusion concerning for malignancy  Patient is currently intubated on ventilator support, sedated, requiring phenylephrine MV: 10.1 L/min Temp (24hrs), Avg:97.8 F (36.6 C), Min:97.3 F (36.3 C), Max:98.2 F (36.8 C)  Propofol: 9.7 ml/hr  OG tube in distal stomach per abd xray  Current wt 53.7 kg; noted weight of 50.8 kg on 10/4. Weight of 56.7 kg in August. Based on weight enconuters, possible recent weight loss with variable weight fluctuations.   Unable to obtain diet and weight history at this time  Labs: Hgb 6.6, potassium 3.0  (L), sodium 145 (wdl), Creatinine 1.55 Meds: NS at 75 ml/hr, ss novolog, colace, miralax, KCl   Diet Order:   Diet Order            Diet NPO time specified  Diet effective now                 EDUCATION NEEDS:   Not appropriate for education at this time  Skin:  Skin Assessment: Reviewed RN Assessment  Last BM:  PTA  Height:   Ht Readings from Last 1 Encounters:  01/29/20 4\' 10"  (1.473 m)    Weight:   Wt Readings from Last 1 Encounters:  01/29/20 53.7 kg    BMI:  Body mass index is 24.74 kg/m.  Estimated Nutritional Needs:   Kcal:  1350-1620 kcals  Protein:  75-90 g  Fluid:  >/= 1.5 L   Kerman Passey MS, RDN, LDN, CNSC Registered Dietitian III Clinical Nutrition RD Pager and On-Call Pager Number Located in [place]

## 2020-01-30 NOTE — Telephone Encounter (Signed)
Patient is currently admitted on 4N. Discussed with 4N pharmacist yesterday and substituted a crushable medication substitute until she can swallow whole medications again. Her regular medication will be resumed once she can swallow and at discharge.

## 2020-01-30 NOTE — Progress Notes (Signed)
STROKE TEAM PROGRESS NOTE   INTERVAL HISTORY RN at the bedside. I also talked with daughter and son over the phone. Pt baseline walks by herself, occasionally needs cane. [name] by herself until recently she had respiratory difficulty, saw by PCP and was told to have pneumonia and put on Abx. She has HIV and on HARRT therapy. She had mild dementia per family and needs some help intermittently but able to do most of ADLs per family. Yesterday she was admitted for aphasia and right gaze and left hemiplegia. S/p tPA and thrombectomy with TICI2c. MRI comfirmed right MCA moderate sized stroke. However, she had oozing from sheath site and her Hb drop from 13.2 to 8.2 this am. ASA was not administrated. Around 11am the Hb on ABG showed 6.5, will need PRBC transfusion. Her CTA neck concerning for left lung mass with pleural effusion, pending CT chest abd/pelvis to evaluate mass and to rule out retroperitoneal hemorrhage. Son and daughter are in agreement for blood transfusion. Pt still has right gaze, not following commands and left hemiplegia.   Vitals:   01/30/20 0800 01/30/20 0815 01/30/20 0830 01/30/20 0845  BP: (!) 126/91 113/69 (!) 128/58 (!) 131/44  Pulse: (!) 58     Resp:      Temp: (!) 97.3 F (36.3 C)     TempSrc: Axillary     SpO2: 96%     Weight:      Height:       CBC:  Recent Labs  Lab 01/29/20 0754 01/29/20 0759 01/29/20 1637 01/30/20 0500  WBC 9.1  --   --  10.6*  NEUTROABS 7.1  --   --  8.7*  HGB 13.2   < > 9.2* 8.2*  HCT 42.3   < > 27.0* 24.8*  MCV 94.6  --   --  92.5  PLT 271  --   --  261   < > = values in this interval not displayed.   Basic Metabolic Panel:  Recent Labs  Lab 01/29/20 0754 01/29/20 0754 01/29/20 0759 01/29/20 1425 01/29/20 1637 01/30/20 0500  NA 141   < > 140   < > 140 141  K 4.3   < > 4.0   < > 3.5 3.1*  CL 99   < > 101  --   --  112*  CO2 25  --   --   --   --  17*  GLUCOSE 153*   < > 151*  --   --  140*  BUN 29*   <  > 32*  --   --  27*  CREATININE 1.69*   < > 1.60*  --   --  1.55*  CALCIUM 9.5  --   --   --   --  7.4*   < > = values in this interval not displayed.   Lipid Panel:  Recent Labs  Lab 01/30/20 0500  CHOL 119  TRIG 207*  206*  HDL 16*  CHOLHDL 7.4  VLDL 41*  LDLCALC 62   HgbA1c:  Recent Labs  Lab 01/29/20 1622  HGBA1C 6.5*   Urine Drug Screen: No results for input(s): LABOPIA, COCAINSCRNUR, LABBENZ, AMPHETMU, THCU, LABBARB in the last 168 hours.  Alcohol Level No results for input(s): ETH in the last 168 hours.  IMAGING past 24 hours DG Abd 1 View  Result Date: 01/29/2020 CLINICAL DATA:  Check gastric catheter placement EXAM: ABDOMEN - 1 VIEW COMPARISON:  None.  FINDINGS: Gastric catheter is noted in the distal esophagus with the tip at the gastroesophageal junction. This should be advanced several cm. Bladder is distended with contrast material. Foley catheter is noted in place. Nonobstructive bowel gas pattern is seen. IMPRESSION: Gastric catheter in the distal esophagus. This should be advanced several cm deeper into the stomach. Electronically Signed   By: Inez Catalina M.D.   On: 01/29/2020 21:25   MR BRAIN WO CONTRAST  Result Date: 01/30/2020 CLINICAL DATA:  Stroke follow-up EXAM: MRI HEAD WITHOUT CONTRAST TECHNIQUE: Multiplanar, multiecho pulse sequences of the brain and surrounding structures were obtained without intravenous contrast. COMPARISON:  CT and CTA from yesterday FINDINGS: Brain: Acute right MCA distribution infarct most confluent along the insula, posterior temporal, and parietal cortex but patchy also in the anterior division cortex. Confluent infarct is seen at the stratum. A small acute infarct is seen in the left cerebellum. Mild petechial hemorrhage in the right cerebrum. Generalized brain atrophy.  No hydrocephalus, mass, or collection. Vascular: Major flow voids are preserved Skull and upper cervical spine: Negative for marrow lesion Sinuses/Orbits: Negative  IMPRESSION: 1. Multifocal infarct in the right MCA territory affecting cortex and striatum. Mild petechial hemorrhage. 2. Small acute left cerebellar infarct. 3. Generalized brain atrophy. Electronically Signed   By: Monte Fantasia M.D.   On: 01/30/2020 06:11   Portable Chest xray  Result Date: 01/30/2020 CLINICAL DATA:  Hypoxia EXAM: PORTABLE CHEST 1 VIEW COMPARISON:  January 29, 2020 FINDINGS: Endotracheal tube tip is 3.7 cm above the carina. Nasogastric tube tip and side port are below the diaphragm. No pneumothorax. There is again noted a large left pleural effusion with suspected atelectasis and consolidation throughout much of the left lung. These findings in combination the to opacification of much of the left hemithorax. The right lung is clear. Heart size as normal. The pulmonary vascularity appears grossly normal. No adenopathy evident. There is aortic atherosclerosis. There is upper lumbar levoscoliosis. IMPRESSION: Tube positions as described without pneumothorax. Large persistent left pleural effusion with suspected atelectasis and consolidation throughout much of the left lung as well. Right lung clear. Stable cardiac silhouette. Aortic Atherosclerosis (ICD10-I70.0). Electronically Signed   By: Lowella Grip III M.D.   On: 01/30/2020 07:54   Portable Chest x-ray  Result Date: 01/29/2020 CLINICAL DATA:  Endotracheal tube placement EXAM: PORTABLE CHEST 1 VIEW COMPARISON:  01/15/2020 FINDINGS: The endotracheal tube terminates above the carina by approximately 2.5 cm. There is a large left-sided pleural effusion which has increased in size since the prior study. There is no pneumothorax. Atherosclerotic changes are noted of the thoracic aorta. The heart size is difficult to fully evaluate. There is no definite acute osseous abnormality. IMPRESSION: 1. Endotracheal tube as above. 2. Large left-sided pleural effusion, increased in size since the prior study. Electronically Signed   By:  Constance Holster M.D.   On: 01/29/2020 19:03    PHYSICAL EXAM  Temp:  [97.3 F (36.3 C)-98.2 F (36.8 C)] 97.3 F (36.3 C) (10/08 0800) Pulse Rate:  [56-92] 69 (10/08 1000) Resp:  [16-28] 28 (10/08 0744) BP: (73-180)/(33-135) 156/135 (10/08 1030) SpO2:  [96 %-100 %] 100 % (10/08 1000) Arterial Line BP: (82-185)/(49-78) 157/61 (10/08 1030) FiO2 (%):  [40 %-50 %] 40 % (10/08 0744)  General - Well nourished, well developed, intubated on sedation.  Ophthalmologic - fundi not visualized due to noncooperation.  Cardiovascular - Regular rate and rhythm.  Neuro - intubated on propofol, eyes open, not following commands. Eye in right  gaze position, not cross midline, not blinking to visual threat bilaterally, doll's eyes sluggish, not tracking on the left visual field but seems able to track on the right, PERRL. Corneal reflex present, gag and cough present. Breathing over the vent.  Facial symmetry not able to test due to ET tube.  Tongue protrusion not cooperative. Spontaneous movement of RUE and RLE and localize to pain, RUE 3/5 at least and RLE 3-/5 at least. LUE and LLE not spontaneous movement, but mild withdraw to pain. DTR 1+ and no babinski. Sensation, coordination and gait not tested.   ASSESSMENT/PLAN Amber Lopez is a 79 y.o. female with history of depression, HLD, HIV, HTN and DM presenting with left hemiplegia, left facial droop, right eye deviation and aphasia. Received IV tPA 01/29/2020 at 0816.  Stroke:  R MCA and small L cerebellar infarcts s/p tPA + IR R M1 occlusion w/ TICI2c revascularization, embolic secondary to unknown source  Code Stroke CT head No acute abnormality. Atrophy. ASPECTS 10.     CTA head & neck LVO R M1. Atherosclerosis. L superior sulcus mass w/ large pleural effusion malignant appearing w/ associated mediastinal adenopathy.  Cerebral angio / IR - R M1 occlusion s/p TICI2c revascularization using Tiger x 1, embotrap x 1, solitaire x 2.  Post IR  CT - no ICH, contrast stain R basal ganglia    MRI  R MCA multifocal infarct w/ mild petechial hemorrhage. Small L cerebellar infarct. Atrophy,  2D Echo EF 55 to 60%, large pleural effusion on the left  LDL 62  HgbA1c 6.5  VTE prophylaxis - SCDs-> Heparin 5000 units sq Q12  No antithrombotic prior to admission, now on No antithrombotic due to severe anemia needing blood transfusion  Therapy recommendations:  pending   Disposition:  pending   Acute Respiratory Failure  Secondary to stroke  Intubated for IR, remains intubated  Sedated  CXR w/ persistent large L pleural effusion w/ atx and consolidation  CCM on board   Severe anemia   Acute blood loss anemia s/p IR, Hb 13.2->9.2->8.2->6.5  PRBC transfusion  CT abd/pelvis no retroperitoneal hemorrhage  Right groin oozing from sheath site - compressed - no active bleeding now  CBC monitoring  CCM on board  Large L lung mass  CTA neck - L superior sulcus mass w/ large pleural effusion malignant appearing w/ associated mediastinal adenopathy.  CT chest large left pleural effusion with near complete left lung collapse.  Left apical mass suspicious for bronchogenic carcinoma  CT abd/pelvis no distal metastasis disease  CCM is considering thoracentesis in a.m. once stable  Hypotension/Hypertension  Home meds:  hctz 25, metoprolol 25  On phenylephepdirine  . BP goal normotensive  Diabetes type II Controlled  Home meds:  None listed  HgbA1c 6.5, goal < 7.0  CBGs  SSI  Dysphagia . Secondary to stroke . NPO . No oral access    HIV  HARRP on Biktarvy.   CD4 in 07/2019 242  Current CD4 pending   Other Stroke Risk Factors  Advanced age  Migraines  Other Active Problems  Depression / anxiety  Hx right breast cancer on arimidex  Post IR groin bleeding. Sheath removed using quick clot and pressure held. Stable  Hospital day # 1  This patient is critically ill due to severe anemia needing  blood transfusion, right MCA occlusion status post TPA and thrombectomy, right MCA stroke, respirate failure need intubation, hypotension on pressor, left lung mass and at significant risk of neurological  worsening, death form sepsis, hypovolemic shock, recurrent stroke, hemorrhagic conversion, seizure. This patient's care requires constant monitoring of vital signs, hemodynamics, respiratory and cardiac monitoring, review of multiple databases, neurological assessment, discussion with family, other specialists and medical decision making of high complexity. I spent 45 minutes of neurocritical care time in the care of this patient. I had long discussion with daughter and son over the phone, updated pt current condition, treatment plan and potential prognosis, and answered all the questions.  They expressed understanding and appreciation.  I discussed with Dr. Pearline Cables CCM service.  Rosalin Hawking, MD PhD Stroke Neurology 01/30/2020 7:49 PM   To contact Stroke Continuity provider, please refer to [email or web address] After hours, contact General Neurology

## 2020-01-30 NOTE — Progress Notes (Signed)
PT Cancellation Note  Patient Details Name: Amber Lopez MRN: [id number] DOB: 12-31-1940   Cancelled Treatment:    Reason Eval/Treat Not Completed: Medical issues which prohibited therapy. Pt is to get a transfusion for Hbg of 6.6 and going to CT per RN, will hold for today.   Zenaida Niece 01/30/2020, 12:27 PM

## 2020-01-30 NOTE — Plan of Care (Signed)
  Problem: Education: Goal: Knowledge of disease or condition will improve Outcome: Not Progressing

## 2020-01-30 NOTE — Progress Notes (Signed)
  Echocardiogram 2D Echocardiogram has been performed with Definity.  Amber Lopez 01/30/2020, 10:12 AM

## 2020-01-30 NOTE — Progress Notes (Signed)
CRITICAL VALUE ALERT  Critical Value:  hbg 6.6   Date & Time Notied:  01/30/20 1203  Provider Notified: Leodis Binet MD @ [phone]  Orders Received/Actions taken: orders already in for blood transfusion

## 2020-01-30 NOTE — Progress Notes (Signed)
Referring Physician(s): Code Stroke  Supervising Physician: Luanne Bras  Patient Status:  [facility] - In-pt  Chief Complaint: Acute R MCA stroke s/p TPA and thrombectomy  Subjective: Patient intubated. Spontaneous movement with right side.  No noted movement on left.  No UOP this AM.  No further issues with groin overnight.   Allergies: Sulfa antibiotics  Medications: Prior to Admission medications   Medication Sig Start Date End Date Taking? Authorizing Provider  amoxicillin-clavulanate (AUGMENTIN) 875-125 MG tablet Take 1 tablet by mouth 2 (two) times daily. 01/26/20  Yes Billie Ruddy, MD  anastrozole (ARIMIDEX) 1 MG tablet Take 1 mg by mouth daily.   Yes [provider]  BIKTARVY 50-200-25 MG TABS tablet TAKE 1 TABLET BY MOUTH DAILY 01/27/20  Yes Golden Circle, FNP  hydrochlorothiazide (HYDRODIURIL) 25 MG tablet Take 25 mg by mouth daily.   Yes [provider]  MELATONIN PO Take 6 mg by mouth daily as needed (For sleep).    Yes [provider]  metoprolol succinate (TOPROL-XL) 25 MG 24 hr tablet Take 1 tablet (25 mg total) by mouth daily. 12/31/19  Yes Billie Ruddy, MD  potassium chloride SA (KLOR-CON) 20 MEQ tablet TAKE 1 TABLET(20 MEQ) BY MOUTH DAILY 01/26/20  Yes Billie Ruddy, MD  hydrochlorothiazide (HYDRODIURIL) 25 MG tablet TAKE 1 TABLET(25 MG) BY MOUTH DAILY Patient not taking: Reported on 01/29/2020 01/26/20   Billie Ruddy, MD     Vital Signs: BP (!) 118/49   Pulse (!) 58   Temp (!) 97.3 F (36.3 C) (Axillary)   Resp (!) 28   Ht 4\' 10"  (1.473 m)   Wt 118 lb 6.2 oz (53.7 kg)   SpO2 96%   BMI 24.74 kg/m   Physical Exam  Intubated, sedated.  Neuro: not following commands on sedation, spontaneous movement on right with weakness noted, no movement on left.  Groin: intact, clean and dry.  Soft.  No evidence of  Pseudoaneurysm or hematoma.  Pulses: DP identified by doppler bilaterally.   Imaging: CT Code Stroke CTA  Head W/WO contrast  Result Date: 01/29/2020 CLINICAL DATA:  Right-sided gaze preference EXAM: CT ANGIOGRAPHY HEAD AND NECK TECHNIQUE: Multidetector CT imaging of the head and neck was performed using the standard protocol during bolus administration of intravenous contrast. Multiplanar CT image reconstructions and MIPs were obtained to evaluate the vascular anatomy. Carotid stenosis measurements (when applicable) are obtained utilizing NASCET criteria, using the distal internal carotid diameter as the denominator. CONTRAST:  Dose is currently not known COMPARISON:  Head CTA from earlier today FINDINGS: CTA NECK FINDINGS Aortic arch: Atheromatous plaque.  Three vessel branching. Right carotid system: Atheromatous plaque mainly at the bifurcation. No stenosis or ulceration. Left carotid system: Atheromatous plaque primarily at the bifurcation. No stenosis or ulceration. Vertebral arteries: Proximal subclavian atherosclerosis without flow limiting stenosis. The left vertebral artery is dominant. No flow limiting stenosis or beading Skeleton: Prominent cervical spine degeneration. Other neck: No acute finding. Upper chest: Pleural and subpleural masslike nodularity greatest at the left apex where there is a 3.4 cm mass. Large left pleural effusion with multi segment atelectasis where partially covered. Prevascular mediastinal adenopathy with single node measuring up to 17 x 10 mm. Review of the MIP images confirms the above findings CTA HEAD FINDINGS Anterior circulation: Right M1 occlusion with some M3 branch reconstitution. There is atheromatous calcification of the carotid siphons without flow limiting stenosis. No contralateral branch occlusion. Posterior circulation: Vertebrobasilar arteries are smooth and  widely patent. Fetal type right PCA flow. No branch occlusion, beading, or aneurysm. Venous sinuses: Limited assessment in the arterial phase Anatomic variants: As above Critical Value/emergent results were  called by telephone at the time of interpretation on 01/29/2020 at 8:29 am to provider ERIC [facility] , who is already aware. Review of the MIP images confirms the above findings IMPRESSION: 1. Emergent large vessel occlusion at the right M1 segment. 2. No visible embolic source. There is atherosclerosis without flow limiting stenosis or ulceration of major vessels. 3. 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 Associated mediastinal adenopathy. Recommend CT of the chest and abdomen with contrast. Electronically Signed   By: Monte Fantasia M.D.   On: 01/29/2020 08:34   DG Abd 1 View  Result Date: 01/30/2020 CLINICAL DATA:  Orogastric tube placement. EXAM: ABDOMEN - 1 VIEW COMPARISON:  01/29/2020 FINDINGS: Orogastric tube is been advanced with tip now in the distal stomach. Bowel gas pattern is normal. Residual contrast material is seen in the urinary bladder with Foley catheter in place. Multiple calcified fibroids again noted in the central pelvis. IMPRESSION: Orogastric tube tip now overlies the distal stomach. Multiple small calcified uterine fibroids. Electronically Signed   By: Marlaine Hind M.D.   On: 01/30/2020 09:28   DG Abd 1 View  Result Date: 01/29/2020 CLINICAL DATA:  Check gastric catheter placement EXAM: ABDOMEN - 1 VIEW COMPARISON:  None. FINDINGS: Gastric catheter is noted in the distal esophagus with the tip at the gastroesophageal junction. This should be advanced several cm. Bladder is distended with contrast material. Foley catheter is noted in place. Nonobstructive bowel gas pattern is seen. IMPRESSION: Gastric catheter in the distal esophagus. This should be advanced several cm deeper into the stomach. Electronically Signed   By: Inez Catalina M.D.   On: 01/29/2020 21:25   CT Code Stroke CTA Neck W/WO contrast  Result Date: 01/29/2020 CLINICAL DATA:  Right-sided gaze preference EXAM: CT ANGIOGRAPHY HEAD AND NECK TECHNIQUE: Multidetector CT  imaging of the head and neck was performed using the standard protocol during bolus administration of intravenous contrast. Multiplanar CT image reconstructions and MIPs were obtained to evaluate the vascular anatomy. Carotid stenosis measurements (when applicable) are obtained utilizing NASCET criteria, using the distal internal carotid diameter as the denominator. CONTRAST:  Dose is currently not known COMPARISON:  Head CTA from earlier today FINDINGS: CTA NECK FINDINGS Aortic arch: Atheromatous plaque.  Three vessel branching. Right carotid system: Atheromatous plaque mainly at the bifurcation. No stenosis or ulceration. Left carotid system: Atheromatous plaque primarily at the bifurcation. No stenosis or ulceration. Vertebral arteries: Proximal subclavian atherosclerosis without flow limiting stenosis. The left vertebral artery is dominant. No flow limiting stenosis or beading Skeleton: Prominent cervical spine degeneration. Other neck: No acute finding. Upper chest: Pleural and subpleural masslike nodularity greatest at the left apex where there is a 3.4 cm mass. Large left pleural effusion with multi segment atelectasis where partially covered. Prevascular mediastinal adenopathy with single node measuring up to 17 x 10 mm. Review of the MIP images confirms the above findings CTA HEAD FINDINGS Anterior circulation: Right M1 occlusion with some M3 branch reconstitution. There is atheromatous calcification of the carotid siphons without flow limiting stenosis. No contralateral branch occlusion. Posterior circulation: Vertebrobasilar arteries are smooth and widely patent. Fetal type right PCA flow. No branch occlusion, beading, or aneurysm. Venous sinuses: Limited assessment in the arterial phase Anatomic variants: As above Critical Value/emergent results were called by telephone  at the time of interpretation on 01/29/2020 at 8:29 am to provider ERIC [facility] , who is already aware. Review of the MIP images confirms  the above findings IMPRESSION: 1. Emergent large vessel occlusion at the right M1 segment. 2. No visible embolic source. There is atherosclerosis without flow limiting stenosis or ulceration of major vessels. 3. 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 Associated mediastinal adenopathy. Recommend CT of the chest and abdomen with contrast. Electronically Signed   By: Monte Fantasia M.D.   On: 01/29/2020 08:34   MR BRAIN WO CONTRAST  Result Date: 01/30/2020 CLINICAL DATA:  Stroke follow-up EXAM: MRI HEAD WITHOUT CONTRAST TECHNIQUE: Multiplanar, multiecho pulse sequences of the brain and surrounding structures were obtained without intravenous contrast. COMPARISON:  CT and CTA from yesterday FINDINGS: Brain: Acute right MCA distribution infarct most confluent along the insula, posterior temporal, and parietal cortex but patchy also in the anterior division cortex. Confluent infarct is seen at the stratum. A small acute infarct is seen in the left cerebellum. Mild petechial hemorrhage in the right cerebrum. Generalized brain atrophy.  No hydrocephalus, mass, or collection. Vascular: Major flow voids are preserved Skull and upper cervical spine: Negative for marrow lesion Sinuses/Orbits: Negative IMPRESSION: 1. Multifocal infarct in the right MCA territory affecting cortex and striatum. Mild petechial hemorrhage. 2. Small acute left cerebellar infarct. 3. Generalized brain atrophy. Electronically Signed   By: Monte Fantasia M.D.   On: 01/30/2020 06:11   Portable Chest xray  Result Date: 01/30/2020 CLINICAL DATA:  Hypoxia EXAM: PORTABLE CHEST 1 VIEW COMPARISON:  January 29, 2020 FINDINGS: Endotracheal tube tip is 3.7 cm above the carina. Nasogastric tube tip and side port are below the diaphragm. No pneumothorax. There is again noted a large left pleural effusion with suspected atelectasis and consolidation throughout much of the left lung. These findings in  combination the to opacification of much of the left hemithorax. The right lung is clear. Heart size as normal. The pulmonary vascularity appears grossly normal. No adenopathy evident. There is aortic atherosclerosis. There is upper lumbar levoscoliosis. IMPRESSION: Tube positions as described without pneumothorax. Large persistent left pleural effusion with suspected atelectasis and consolidation throughout much of the left lung as well. Right lung clear. Stable cardiac silhouette. Aortic Atherosclerosis (ICD10-I70.0). Electronically Signed   By: Lowella Grip III M.D.   On: 01/30/2020 07:54   Portable Chest x-ray  Result Date: 01/29/2020 CLINICAL DATA:  Endotracheal tube placement EXAM: PORTABLE CHEST 1 VIEW COMPARISON:  01/15/2020 FINDINGS: The endotracheal tube terminates above the carina by approximately 2.5 cm. There is a large left-sided pleural effusion which has increased in size since the prior study. There is no pneumothorax. Atherosclerotic changes are noted of the thoracic aorta. The heart size is difficult to fully evaluate. There is no definite acute osseous abnormality. IMPRESSION: 1. Endotracheal tube as above. 2. Large left-sided pleural effusion, increased in size since the prior study. Electronically Signed   By: Constance Holster M.D.   On: 01/29/2020 19:03   CT HEAD CODE STROKE WO CONTRAST  Result Date: 01/29/2020 CLINICAL DATA:  Code stroke. Facial droop and fixed gaze to the right EXAM: CT HEAD WITHOUT CONTRAST TECHNIQUE: Contiguous axial images were obtained from the base of the skull through the vertex without intravenous contrast. COMPARISON:  None. FINDINGS: Brain: No evidence of acute infarction, hemorrhage, hydrocephalus, extra-axial collection or mass lesion/mass effect. Generalized atrophy. Vascular: No hyperdense vessel or unexpected calcification. Skull: Normal. Negative for fracture  or focal lesion. Sinuses/Orbits: Gaze to the right, as noted in the history Other:  These results were communicated to Dr. Cheral Marker at 8:09 amon 10/7/2021by text page via the [facility] messaging system. ASPECTS [facility] Stroke Program Early CT Score) - Ganglionic level infarction (caudate, lentiform nuclei, internal capsule, insula, M1-M3 cortex): 7 - Supraganglionic infarction (M4-M6 cortex): 3 Total score (0-10 with 10 being normal): 10 IMPRESSION: 1. No acute finding.  ASPECTS is 10. 2. Generalized atrophy. Electronically Signed   By: Monte Fantasia M.D.   On: 01/29/2020 08:09    Labs:  CBC: Recent Labs    03/13/19 1550 03/13/19 1550 01/29/20 0754 01/29/20 0754 01/29/20 0759 01/29/20 1425 01/29/20 1637 01/30/20 0500  WBC 4.5  --  9.1  --   --   --   --  10.6*  HGB 11.5*   < > 13.2   < > 13.6 8.2* 9.2* 8.2*  HCT 35.1   < > 42.3   < > 40.0 24.0* 27.0* 24.8*  PLT 242  --  271  --   --   --   --  261   < > = values in this interval not displayed.    COAGS: Recent Labs    01/29/20 0754  INR 1.2  APTT 28    BMP: Recent Labs    03/13/19 1550 03/13/19 1550 08/07/19 1422 08/11/19 1530 08/11/19 1530 01/29/20 0754 01/29/20 0754 01/29/20 0759 01/29/20 1425 01/29/20 1637 01/30/20 0500  NA 138   < > 138 138   < > 141   < > 140 138 140 141  K 4.4   < > 4.0 4.0   < > 4.3   < > 4.0 3.3* 3.5 3.1*  CL 101   < > 99 100  --  99  --  101  --   --  112*  CO2 29   < > 31 26  --  25  --   --   --   --  17*  GLUCOSE 114*   < > 122* 237*  --  153*  --  151*  --   --  140*  BUN 32*   < > 20 22  --  29*  --  32*  --   --  27*  CALCIUM 9.7   < > 9.5 9.8  --  9.5  --   --   --   --  7.4*  CREATININE 1.45*   < > 1.26* 1.26*  --  1.69*  --  1.60*  --   --  1.55*  GFRNONAA 34*  --   --  41*  --  28*  --   --   --   --  32*  GFRAA 40*  --   --  47*  --   --   --   --   --   --   --    < > = values in this interval not displayed.    LIVER FUNCTION TESTS: Recent Labs    03/13/19 1550 08/11/19 1530 01/29/20 0754  BILITOT 0.3 0.3 1.0  AST 16 14 21   ALT 9 7 12   ALKPHOS  --    --  80  PROT 7.6 7.5 7.7  ALBUMIN  --   --  2.8*    Assessment and Plan: S/P RT  Common carotid arteriogram followed by revascularization of RT MCA M 1 occlusion using x 1 pass with Tiger 21 retrievermx 1 pass with  5 mm x 37 mm embotrap retriever,x 1 pass with solitaire22mm x 20 mm, with aspiration  and x 2 passes with direct aspiration achieving a TICI 2C revascularization Remains intubated this AM.  Neuro exam deferred on sedation.  Decreased UOP. SCr 1.55 after procedure yesterday.  Groin soft.  No issues since sheath removed.  Pulses intact.  IR available as needed.   Electronically Signed: Docia Barrier, PA 01/30/2020, 9:39 AM   I spent a total of 15 Minutes at the the patient's bedside AND on the patient's hospital floor or unit, greater than 50% of which was counseling/coordinating care for R MCA occlusion.

## 2020-01-30 NOTE — Progress Notes (Signed)
OT Cancellation Note  Patient Details Name: Amber Lopez MRN: [id number] DOB: [birth year]   Cancelled Treatment:    Reason Eval/Treat Not Completed: Medical issues which prohibited therapy. Pt is to get a transfusion for Hbg of 6.6 and going to CT per RN, will hold for today.  Golden Circle, OTR/L Acute Rehab Services Pager [phone] Office [phone]     Almon Register 01/30/2020, 12:17 PM

## 2020-01-30 NOTE — Progress Notes (Signed)
RT note. Pt. Transferred from 4N to MRI back without any complications. RT continue to monitor

## 2020-01-30 NOTE — Progress Notes (Signed)
Attempted placement of OG tube at 20:00, 2 RNs verified placement by auscultation and verified that there was no coiling in the mouth, pt did not tolerate well with drop in BP; KUB ordered per protocol and OG tube not in appropriate place and needs to be advanced; when at bedside to advance OG, tube noted to be coiled in patient mouth, tube removed and patient BP dropped again with 10-15 minutes to recover and titration of pressor; will attempt when patient able to tolerate and maintain vitals

## 2020-01-31 ENCOUNTER — Inpatient Hospital Stay (HOSPITAL_COMMUNITY): Payer: Medicare Other

## 2020-01-31 DIAGNOSIS — I639 Cerebral infarction, unspecified: Secondary | ICD-10-CM | POA: Diagnosis not present

## 2020-01-31 DIAGNOSIS — I6601 Occlusion and stenosis of right middle cerebral artery: Secondary | ICD-10-CM | POA: Diagnosis not present

## 2020-01-31 LAB — BODY FLUID CELL COUNT WITH DIFFERENTIAL
Eos, Fluid: 0 %
Lymphs, Fluid: 43 %
Monocyte-Macrophage-Serous Fluid: 28 % — ABNORMAL LOW (ref 50–90)
Neutrophil Count, Fluid: 29 % — ABNORMAL HIGH (ref 0–25)
Total Nucleated Cell Count, Fluid: 271 cu mm (ref 0–1000)

## 2020-01-31 LAB — BASIC METABOLIC PANEL
Anion gap: 10 (ref 5–15)
BUN: 21 mg/dL (ref 8–23)
CO2: 14 mmol/L — ABNORMAL LOW (ref 22–32)
Calcium: 7.3 mg/dL — ABNORMAL LOW (ref 8.9–10.3)
Chloride: 117 mmol/L — ABNORMAL HIGH (ref 98–111)
Creatinine, Ser: 1.47 mg/dL — ABNORMAL HIGH (ref 0.44–1.00)
GFR, Estimated: 34 mL/min — ABNORMAL LOW (ref >60)
Glucose, Bld: 121 mg/dL — ABNORMAL HIGH (ref 70–99)
Potassium: 3.8 mmol/L (ref 3.5–5.1)
Sodium: 141 mmol/L (ref 135–145)

## 2020-01-31 LAB — CBC
HCT: 33 % — ABNORMAL LOW (ref 36.0–46.0)
Hemoglobin: 10.8 g/dL — ABNORMAL LOW (ref 12.0–15.0)
MCH: 29.5 pg (ref 26.0–34.0)
MCHC: 32.7 g/dL (ref 30.0–36.0)
MCV: 90.2 fL (ref 80.0–100.0)
Platelets: 184 10*3/uL (ref 150–400)
RBC: 3.66 MIL/uL — ABNORMAL LOW (ref 3.87–5.11)
RDW: 15.4 % (ref 11.5–15.5)
WBC: 10 10*3/uL (ref 4.0–10.5)
nRBC: 0.3 % — ABNORMAL HIGH (ref 0.0–0.2)

## 2020-01-31 LAB — GLUCOSE, CAPILLARY
Glucose-Capillary: 97 mg/dL (ref 70–99)
Glucose-Capillary: 91 mg/dL (ref 70–99)
Glucose-Capillary: 104 mg/dL — ABNORMAL HIGH (ref 70–99)
Glucose-Capillary: 99 mg/dL (ref 70–99)
Glucose-Capillary: 148 mg/dL — ABNORMAL HIGH (ref 70–99)
Glucose-Capillary: 142 mg/dL — ABNORMAL HIGH (ref 70–99)

## 2020-01-31 LAB — LACTATE DEHYDROGENASE, PLEURAL OR PERITONEAL FLUID: LD, Fluid: 2052 U/L — ABNORMAL HIGH (ref 3–23)

## 2020-01-31 LAB — ALBUMIN, PLEURAL OR PERITONEAL FLUID: Albumin, Fluid: 2.7 g/dL

## 2020-01-31 LAB — PROTEIN, PLEURAL OR PERITONEAL FLUID: Total protein, fluid: 5.4 g/dL

## 2020-01-31 LAB — TRIGLYCERIDES: Triglycerides: 224 mg/dL — ABNORMAL HIGH (ref <150)

## 2020-01-31 MED ORDER — ASPIRIN 81 MG PO CHEW: 81.0000 mg | CHEWABLE_TABLET | Freq: Every day | ORAL | Status: DC

## 2020-01-31 MED ORDER — LIDOCAINE HCL (PF) 1 % IJ SOLN
INTRAMUSCULAR | Status: AC
  Administered 2020-01-31: 5 mL
  Filled 2020-01-31: qty 5

## 2020-01-31 MED ORDER — ASPIRIN 81 MG PO CHEW
81.0000 mg | CHEWABLE_TABLET | Freq: Every day | ORAL | Status: DC
  Administered 2020-01-31 – 2020-03-22 (×51): 81 mg
  Filled 2020-01-31 (×50): qty 1

## 2020-01-31 MED ORDER — INFLUENZA VAC A&B SA ADJ QUAD 0.5 ML IM PRSY
0.5000 mL | PREFILLED_SYRINGE | INTRAMUSCULAR | Status: DC
  Filled 2020-01-31: qty 0.5

## 2020-01-31 NOTE — Progress Notes (Signed)
STROKE TEAM PROGRESS NOTE   INTERVAL HISTORY RN at the bedside. I also talked with daughter over the phone. . Pt still has right gaze, not following commands and left hemiplegia.  CT scan of the chest abdomen and pelvis shows persistent large left pleural effusion with near complete lung collapse.  3.5 x 3.3 cm left lung apical mass suspicious for bronchogenic carcinoma.  Pulmonary critical care team plan on placing chest tube today and sending the fluid for cytology.  Patient remains on ventilatory support for respiratory failure.  Vital signs are stable. Vitals:   01/31/20 0700 01/31/20 0736 01/31/20 0737 01/31/20 0800  BP: (!) 135/54 (!) 135/54  (!) 118/52  Pulse: 67 66    Resp:  17    Temp: (!) 97.5 F (36.4 C) 97.7 F (36.5 C)  97.7 F (36.5 C)  TempSrc:    Esophageal  SpO2: 97% 97% 97% 97%  Weight:      Height:       CBC:  Recent Labs  Lab 01/29/20 0754 01/29/20 0759 01/30/20 0500 01/30/20 1021 01/30/20 1819 01/31/20 0512  WBC 9.1   < > 10.6*   < > 8.6 10.0  NEUTROABS 7.1  --  8.7*  --   --   --   HGB 13.2   < > 8.2*   < > 10.2* 10.8*  HCT 42.3   < > 24.8*   < > 31.3* 33.0*  MCV 94.6   < > 92.5   < > 89.7 90.2  PLT 271   < > 261   < > 160 184   < > = values in this interval not displayed.   Basic Metabolic Panel:  Recent Labs  Lab 01/30/20 0500 01/30/20 0500 01/30/20 1042 01/31/20 0512  NA 141   < > 145 141  K 3.1*   < > 3.0* 3.8  CL 112*  --   --  117*  CO2 17*  --   --  14*  GLUCOSE 140*  --   --  121*  BUN 27*  --   --  21  CREATININE 1.55*  --   --  1.47*  CALCIUM 7.4*  --   --  7.3*   < > = values in this interval not displayed.   Lipid Panel:  Recent Labs  Lab 01/30/20 0500 01/30/20 0500 01/31/20 0512  CHOL 119  --   --   TRIG 207*  206*   < > 224*  HDL 16*  --   --   CHOLHDL 7.4  --   --   VLDL 41*  --   --   LDLCALC 62  --   --    < > = values in this interval not displayed.   HgbA1c:  Recent Labs  Lab 01/29/20 1622  HGBA1C 6.5*    Urine Drug Screen:  Recent Labs  Lab 01/30/20 1118  LABOPIA NONE DETECTED  COCAINSCRNUR NONE DETECTED  LABBENZ NONE DETECTED  AMPHETMU NONE DETECTED  THCU NONE DETECTED  LABBARB NONE DETECTED    Alcohol Level No results for input(s): ETH in the last 168 hours.  IMAGING past 24 hours CT CHEST W CONTRAST  Result Date: 01/30/2020 CLINICAL DATA:  Right M1 occlusion with left superior sulcus lung mass on neck CTA. Neuro interventional procedure yesterday. Concern for retroperitoneal hemorrhage and malignancy. EXAM: CT CHEST, ABDOMEN, AND PELVIS WITH CONTRAST TECHNIQUE: Multidetector CT imaging of the chest, abdomen and pelvis was performed following the standard protocol  during bolus administration of intravenous contrast. CONTRAST:  82mL OMNIPAQUE IOHEXOL 300 MG/ML  SOLN COMPARISON:  Neck CT 01/29/2020. Chest and abdominal radiographs today. FINDINGS: CT CHEST FINDINGS Cardiovascular: Diffuse atherosclerosis of the aorta, great vessels and coronary arteries. No acute vascular findings are seen. The heart size is normal. There is no pericardial effusion. Mediastinum/Nodes: There are small AP window lymph nodes measuring up to 12 mm short axis on image 27/3. No other enlarged mediastinal, hilar or axillary lymph nodes are identified. There are surgical clips in the right axilla. Endotracheal and nasogastric tubes are in place. The thyroid gland, trachea and esophagus demonstrate no significant findings. Lungs/Pleura: Again demonstrated is a large left pleural effusion with near complete opacification of the left hemithorax. The left lower lobe is completely collapsed, and there is partial collapse of the left upper lobe. There is persistent concern of a left apical mass measuring 3.5 x 3.3 cm on image 17/3. No definite chest wall invasion. There is no significant right pleural effusion or suspicious right lung nodularity. Dependent opacities in the right lower lobe likely represent atelectasis or  inflammation. Musculoskeletal/Chest wall: No chest wall mass or suspicious osseous findings. There are degenerative changes throughout the spine associated with a convex right thoracolumbar scoliosis. CT ABDOMEN AND PELVIS FINDINGS Hepatobiliary: Hepatic evaluation is limited by scanning prior to opacification of the hepatic veins. This likely accounts for mild heterogeneity of the hepatic parenchyma, and no focal lesions are seen on the delayed images obtained through the kidneys. The gallbladder is contracted or surgically absent without associated surgical clips. There is no biliary dilatation. Pancreas: Unremarkable. No pancreatic ductal dilatation or surrounding inflammatory changes. Spleen: Mild heterogeneity of the spleen on the early postcontrast images does not persist on the delayed images, likely perfusion anomalies. The spleen is normal in size. Adrenals/Urinary Tract: Both adrenal glands appear normal. Left renal cortical scarring. No evidence of renal mass, urinary tract calculus or hydronephrosis. The bladder is decompressed by a Foley catheter. Stomach/Bowel: No evidence of bowel wall thickening, distention or surrounding inflammatory change. Enteric tube is looped in the distal stomach. Vascular/Lymphatic: There are no enlarged abdominal or pelvic lymph nodes. Aortic and branch vessel atherosclerosis without acute vascular findings. There is no evidence of retroperitoneal hematoma. The portal, superior mesenteric and splenic veins appear patent. Reproductive: Multiple calcified uterine fibroids.  No adnexal mass. Other: No ascites or free intraperitoneal air. Mild soft tissue stranding in the right groin without significant hematoma. Musculoskeletal: No acute or significant osseous findings. Lumbar facet arthropathy noted. IMPRESSION: 1. Persistent large left pleural effusion with near complete left lung collapse. There is persistent concern of a left apical mass measuring 3.5 x 3.3 cm, suspicious  for bronchogenic carcinoma. Mildly enlarged AP window lymph nodes are nonspecific. Recommend thoracentesis and follow up thoracic CT. 2. No evidence of distant metastatic disease. 3. No acute findings in the abdomen or pelvis. No evidence of retroperitoneal hematoma. 4. Aortic Atherosclerosis (ICD10-I70.0). Electronically Signed   By: Richardean Sale M.D.   On: 01/30/2020 14:36   CT ABDOMEN PELVIS W CONTRAST  Result Date: 01/30/2020 CLINICAL DATA:  Right M1 occlusion with left superior sulcus lung mass on neck CTA. Neuro interventional procedure yesterday. Concern for retroperitoneal hemorrhage and malignancy. EXAM: CT CHEST, ABDOMEN, AND PELVIS WITH CONTRAST TECHNIQUE: Multidetector CT imaging of the chest, abdomen and pelvis was performed following the standard protocol during bolus administration of intravenous contrast. CONTRAST:  66mL OMNIPAQUE IOHEXOL 300 MG/ML  SOLN COMPARISON:  Neck CT 01/29/2020. Chest  and abdominal radiographs today. FINDINGS: CT CHEST FINDINGS Cardiovascular: Diffuse atherosclerosis of the aorta, great vessels and coronary arteries. No acute vascular findings are seen. The heart size is normal. There is no pericardial effusion. Mediastinum/Nodes: There are small AP window lymph nodes measuring up to 12 mm short axis on image 27/3. No other enlarged mediastinal, hilar or axillary lymph nodes are identified. There are surgical clips in the right axilla. Endotracheal and nasogastric tubes are in place. The thyroid gland, trachea and esophagus demonstrate no significant findings. Lungs/Pleura: Again demonstrated is a large left pleural effusion with near complete opacification of the left hemithorax. The left lower lobe is completely collapsed, and there is partial collapse of the left upper lobe. There is persistent concern of a left apical mass measuring 3.5 x 3.3 cm on image 17/3. No definite chest wall invasion. There is no significant right pleural effusion or suspicious right lung  nodularity. Dependent opacities in the right lower lobe likely represent atelectasis or inflammation. Musculoskeletal/Chest wall: No chest wall mass or suspicious osseous findings. There are degenerative changes throughout the spine associated with a convex right thoracolumbar scoliosis. CT ABDOMEN AND PELVIS FINDINGS Hepatobiliary: Hepatic evaluation is limited by scanning prior to opacification of the hepatic veins. This likely accounts for mild heterogeneity of the hepatic parenchyma, and no focal lesions are seen on the delayed images obtained through the kidneys. The gallbladder is contracted or surgically absent without associated surgical clips. There is no biliary dilatation. Pancreas: Unremarkable. No pancreatic ductal dilatation or surrounding inflammatory changes. Spleen: Mild heterogeneity of the spleen on the early postcontrast images does not persist on the delayed images, likely perfusion anomalies. The spleen is normal in size. Adrenals/Urinary Tract: Both adrenal glands appear normal. Left renal cortical scarring. No evidence of renal mass, urinary tract calculus or hydronephrosis. The bladder is decompressed by a Foley catheter. Stomach/Bowel: No evidence of bowel wall thickening, distention or surrounding inflammatory change. Enteric tube is looped in the distal stomach. Vascular/Lymphatic: There are no enlarged abdominal or pelvic lymph nodes. Aortic and branch vessel atherosclerosis without acute vascular findings. There is no evidence of retroperitoneal hematoma. The portal, superior mesenteric and splenic veins appear patent. Reproductive: Multiple calcified uterine fibroids.  No adnexal mass. Other: No ascites or free intraperitoneal air. Mild soft tissue stranding in the right groin without significant hematoma. Musculoskeletal: No acute or significant osseous findings. Lumbar facet arthropathy noted. IMPRESSION: 1. Persistent large left pleural effusion with near complete left lung  collapse. There is persistent concern of a left apical mass measuring 3.5 x 3.3 cm, suspicious for bronchogenic carcinoma. Mildly enlarged AP window lymph nodes are nonspecific. Recommend thoracentesis and follow up thoracic CT. 2. No evidence of distant metastatic disease. 3. No acute findings in the abdomen or pelvis. No evidence of retroperitoneal hematoma. 4. Aortic Atherosclerosis (ICD10-I70.0). Electronically Signed   By: Richardean Sale M.D.   On: 01/30/2020 14:36    PHYSICAL EXAM  Temp:  [96.7 F (35.9 C)-98.1 F (36.7 C)] 97.7 F (36.5 C) (10/09 0800) Pulse Rate:  [60-83] 66 (10/09 0736) Resp:  [16-22] 17 (10/09 0736) BP: (66-150)/(46-106) 118/52 (10/09 0800) SpO2:  [97 %-100 %] 97 % (10/09 0800) Arterial Line BP: (109-168)/(46-71) 141/57 (10/09 0800) FiO2 (%):  [40 %] 40 % (10/09 0737)  General - Well nourished, well developed, intubated on sedation.  Ophthalmologic - fundi not visualized due to noncooperation.  Cardiovascular - Regular rate and rhythm.  Neuro - intubated on propofol, eyes open, not following commands.  Globally aphasic.  Eye in right gaze position, not cross midline, not blinking to visual threat bilaterally, doll's eyes sluggish, not tracking on the left visual field but seems able to track on the right, PERRL. Corneal reflex present, gag and cough present. Breathing over the vent.  Facial symmetry not able to test due to ET tube.  Tongue protrusion not cooperative. Spontaneous movement of RUE and RLE and localize to pain, RUE 3/5 at least and RLE 3-/5 at least. LUE and LLE not spontaneous movement, but mild withdraw to pain. DTR 1+ and no babinski. Sensation, coordination and gait not tested.   ASSESSMENT/PLAN Amber Lopez is a 79 y.o. female with history of depression, HLD, HIV, HTN and DM presenting with left hemiplegia, left facial droop, right eye deviation and aphasia. Received IV tPA 01/29/2020 at 0816.  Stroke:  R MCA and small L cerebellar  infarcts s/p tPA + IR R M1 occlusion w/ TICI2c revascularization, embolic secondary to unknown source possibly hypercoagulability from lung cancer  Code Stroke CT head No acute abnormality. Atrophy. ASPECTS 10.     CTA head & neck LVO R M1. Atherosclerosis. L superior sulcus mass w/ large pleural effusion malignant appearing w/ associated mediastinal adenopathy.  Cerebral angio / IR - R M1 occlusion s/p TICI2c revascularization using Tiger x 1, embotrap x 1, solitaire x 2.  Post IR CT - no ICH, contrast stain R basal ganglia    MRI  R MCA multifocal infarct w/ mild petechial hemorrhage. Small L cerebellar infarct. Atrophy,  2D Echo EF 55 to 60%, large pleural effusion on the left  LDL 62  HgbA1c 6.5  VTE prophylaxis - SCDs-> Heparin 5000 units sq Q12  No antithrombotic prior to admission, now on No antithrombotic due to severe anemia needing blood transfusion  Therapy recommendations:  pending   Disposition:  pending   Acute Respiratory Failure  Secondary to stroke  Intubated for IR, remains intubated  Sedated  CXR w/ persistent large L pleural effusion w/ atx and consolidation  CCM on board   Severe anemia   Acute blood loss anemia s/p IR, Hb 13.2->9.2->8.2->6.5  PRBC transfusion  CT abd/pelvis no retroperitoneal hemorrhage  Right groin oozing from sheath site - compressed - no active bleeding now  CBC monitoring  CCM on board  Large L lung mass  CTA neck - L superior sulcus mass w/ large pleural effusion malignant appearing w/ associated mediastinal adenopathy.  CT chest large left pleural effusion with near complete left lung collapse.  Left apical mass suspicious for bronchogenic carcinoma  CT abd/pelvis no distal metastasis disease  CCM is considering thoracentesis in a.m. once stable  Hypotension/Hypertension  Home meds:  hctz 25, metoprolol 25  On phenylephepdirine  . BP goal normotensive  Diabetes type II Controlled  Home meds:  None  listed  HgbA1c 6.5, goal < 7.0  CBGs  SSI  Dysphagia . Secondary to stroke . NPO . No oral access    HIV  HARRP on Biktar[employer].   CD4 in 07/2019 242  Current CD4 pending   Other Stroke Risk Factors  Advanced age  Migraines  Other Active Problems  Depression / anxiety  Hx right breast cancer on arimidex  Post IR groin bleeding. Sheath removed using quick clot and pressure held. Stable  Hospital day # 2 I had a long discussion with the patient's daughter over the phone regarding her suspected diagnosis of lung cancer and poor prognosis.  I recommend family meeting with patient's  son and daughter soon to decide on goals of care.  Continue ventilatory support for respiratory failure and chest tube for drainage of pleural effusion per critical care medicine.  Discussed with Dr. Marolyn Hammock critical care medicine This patient is critically ill due to severe anemia needing blood transfusion, right MCA occlusion status post TPA and thrombectomy, right MCA stroke, respirate failure need intubation, hypotension on pressor, left lung mass and at significant risk of neurological worsening, death form sepsis, hypovolemic shock, recurrent stroke, hemorrhagic conversion, seizure. This patient's care requires constant monitoring of vital signs, hemodynamics, respiratory and cardiac monitoring, review of multiple databases, neurological assessment, discussion with family, other specialists and medical decision making of high complexity. I spent 35 minutes of neurocritical care time in the care of this patient. I had long discussion with daughter and son over the phone, updated pt current condition, treatment plan and potential prognosis, and answered all the questions.  They expressed understanding and appreciation.  I discussed with Dr. Pearline Cables CCM service.  Amber Contras, MD Stroke Neurology 01/31/2020 12:24 PM   To contact Stroke Continuity provider, please refer to [email or web address] After hours,  contact General Neurology

## 2020-01-31 NOTE — Progress Notes (Signed)
RT NOTE: RT transported patient on ventilator from room 4N19 to CT and back to room 4Q68 with no complications. Vitals are stable. RT will continue to monitor.

## 2020-01-31 NOTE — Progress Notes (Addendum)
PT Cancellation Note  Patient Details Name: Amber Lopez MRN: [id number] DOB: [birth year]   Cancelled Treatment:    Reason Eval/Treat Not Completed: Active bedrest order. Pt remains intubated and on vent. PT will continue to f/u with pt acutely and await medical appropriateness.     [place] 01/31/2020, 7:48 AM

## 2020-01-31 NOTE — Progress Notes (Signed)
[facility] Progress Note [name] DOB: 08-09-1940 MRN: [id number]   Date of Service  01/31/2020  HPI/Events of Note  Patient in the hospital s/p CVA and and with acute on chronic respiratory failure on the ventilator, current blood pressure is 161/115.   eICU Interventions  None, Patient has blood pressure parameters.        Kerry Kass Sharian Delia 01/31/2020, 9:33 PM

## 2020-01-31 NOTE — Progress Notes (Incomplete)
STROKE TEAM PROGRESS NOTE   INTERVAL HISTORY RN at the bedside. I also talked with daughter and son over the phone. Pt baseline walks by herself, occasionally needs cane. [name] by herself until recently she had respiratory difficulty, saw by PCP and was told to have pneumonia and put on Abx. She has HIV and on HARRT therapy. She had mild dementia per family and needs some help intermittently but able to do most of ADLs per family. Yesterday she was admitted for aphasia and right gaze and left hemiplegia. S/p tPA and thrombectomy with TICI2c. MRI comfirmed right MCA moderate sized stroke. However, she had oozing from sheath site and her Hb drop from 13.2 to 8.2 this am. ASA was not administrated. Around 11am the Hb on ABG showed 6.5, will need PRBC transfusion. Her CTA neck concerning for left lung mass with pleural effusion, pending CT chest abd/pelvis to evaluate mass and to rule out retroperitoneal hemorrhage. Son and daughter are in agreement for blood transfusion. Pt still has right gaze, not following commands and left hemiplegia.   Vitals:   01/31/20 0200 01/31/20 0400 01/31/20 0500 01/31/20 0600  BP: (!) 150/106 140/75 (!) 129/50 140/62  Pulse: 72 71 66 67  Resp:      Temp: 97.9 F (36.6 C) 97.7 F (36.5 C) (!) 97.5 F (36.4 C) (!) 97.3 F (36.3 C)  TempSrc:      SpO2: 100% 100% 100% 99%  Weight:      Height:       CBC:  Recent Labs  Lab 01/29/20 0754 01/29/20 0759 01/30/20 0500 01/30/20 1021 01/30/20 1819 01/31/20 0512  WBC 9.1   < > 10.6*   < > 8.6 10.0  NEUTROABS 7.1  --  8.7*  --   --   --   HGB 13.2   < > 8.2*   < > 10.2* 10.8*  HCT 42.3   < > 24.8*   < > 31.3* 33.0*  MCV 94.6   < > 92.5   < > 89.7 90.2  PLT 271   < > 261   < > 160 184   < > = values in this interval not displayed.   Basic Metabolic Panel:  Recent Labs  Lab 01/30/20 0500 01/30/20 0500 01/30/20 1042 01/31/20 0512  NA 141   < > 145 141  K 3.1*   < > 3.0* 3.8  CL 112*  --    --  117*  CO2 17*  --   --  14*  GLUCOSE 140*  --   --  121*  BUN 27*  --   --  21  CREATININE 1.55*  --   --  1.47*  CALCIUM 7.4*  --   --  7.3*   < > = values in this interval not displayed.   Lipid Panel:  Recent Labs  Lab 01/30/20 0500 01/30/20 0500 01/31/20 0512  CHOL 119  --   --   TRIG 207*  206*   < > 224*  HDL 16*  --   --   CHOLHDL 7.4  --   --   VLDL 41*  --   --   LDLCALC 62  --   --    < > = values in this interval not displayed.   HgbA1c:  Recent Labs  Lab 01/29/20 1622  HGBA1C 6.5*   Urine Drug Screen:  Recent Labs  Lab 01/30/20 1118  LABOPIA NONE DETECTED  COCAINSCRNUR NONE DETECTED  LABBENZ NONE DETECTED  AMPHETMU NONE DETECTED  THCU NONE DETECTED  LABBARB NONE DETECTED    Alcohol Level No results for input(s): ETH in the last 168 hours.  IMAGING past 24 hours DG Abd 1 View  Result Date: 01/30/2020 CLINICAL DATA:  Orogastric tube placement. EXAM: ABDOMEN - 1 VIEW COMPARISON:  01/29/2020 FINDINGS: Orogastric tube is been advanced with tip now in the distal stomach. Bowel gas pattern is normal. Residual contrast material is seen in the urinary bladder with Foley catheter in place. Multiple calcified fibroids again noted in the central pelvis. IMPRESSION: Orogastric tube tip now overlies the distal stomach. Multiple small calcified uterine fibroids. Electronically Signed   By: Marlaine Hind M.D.   On: 01/30/2020 09:28   CT CHEST W CONTRAST  Result Date: 01/30/2020 CLINICAL DATA:  Right M1 occlusion with left superior sulcus lung mass on neck CTA. Neuro interventional procedure yesterday. Concern for retroperitoneal hemorrhage and malignancy. EXAM: CT CHEST, ABDOMEN, AND PELVIS WITH CONTRAST TECHNIQUE: Multidetector CT imaging of the chest, abdomen and pelvis was performed following the standard protocol during bolus administration of intravenous contrast. CONTRAST:  46mL OMNIPAQUE IOHEXOL 300 MG/ML  SOLN COMPARISON:  Neck CT 01/29/2020. Chest and  abdominal radiographs today. FINDINGS: CT CHEST FINDINGS Cardiovascular: Diffuse atherosclerosis of the aorta, great vessels and coronary arteries. No acute vascular findings are seen. The heart size is normal. There is no pericardial effusion. Mediastinum/Nodes: There are small AP window lymph nodes measuring up to 12 mm short axis on image 27/3. No other enlarged mediastinal, hilar or axillary lymph nodes are identified. There are surgical clips in the right axilla. Endotracheal and nasogastric tubes are in place. The thyroid gland, trachea and esophagus demonstrate no significant findings. Lungs/Pleura: Again demonstrated is a large left pleural effusion with near complete opacification of the left hemithorax. The left lower lobe is completely collapsed, and there is partial collapse of the left upper lobe. There is persistent concern of a left apical mass measuring 3.5 x 3.3 cm on image 17/3. No definite chest wall invasion. There is no significant right pleural effusion or suspicious right lung nodularity. Dependent opacities in the right lower lobe likely represent atelectasis or inflammation. Musculoskeletal/Chest wall: No chest wall mass or suspicious osseous findings. There are degenerative changes throughout the spine associated with a convex right thoracolumbar scoliosis. CT ABDOMEN AND PELVIS FINDINGS Hepatobiliary: Hepatic evaluation is limited by scanning prior to opacification of the hepatic veins. This likely accounts for mild heterogeneity of the hepatic parenchyma, and no focal lesions are seen on the delayed images obtained through the kidneys. The gallbladder is contracted or surgically absent without associated surgical clips. There is no biliary dilatation. Pancreas: Unremarkable. No pancreatic ductal dilatation or surrounding inflammatory changes. Spleen: Mild heterogeneity of the spleen on the early postcontrast images does not persist on the delayed images, likely perfusion anomalies. The  spleen is normal in size. Adrenals/Urinary Tract: Both adrenal glands appear normal. Left renal cortical scarring. No evidence of renal mass, urinary tract calculus or hydronephrosis. The bladder is decompressed by a Foley catheter. Stomach/Bowel: No evidence of bowel wall thickening, distention or surrounding inflammatory change. Enteric tube is looped in the distal stomach. Vascular/Lymphatic: There are no enlarged abdominal or pelvic lymph nodes. Aortic and branch vessel atherosclerosis without acute vascular findings. There is no evidence of retroperitoneal hematoma. The portal, superior mesenteric and splenic veins appear patent. Reproductive: Multiple calcified uterine fibroids.  No adnexal mass. Other: No ascites or free intraperitoneal  air. Mild soft tissue stranding in the right groin without significant hematoma. Musculoskeletal: No acute or significant osseous findings. Lumbar facet arthropathy noted. IMPRESSION: 1. Persistent large left pleural effusion with near complete left lung collapse. There is persistent concern of a left apical mass measuring 3.5 x 3.3 cm, suspicious for bronchogenic carcinoma. Mildly enlarged AP window lymph nodes are nonspecific. Recommend thoracentesis and follow up thoracic CT. 2. No evidence of distant metastatic disease. 3. No acute findings in the abdomen or pelvis. No evidence of retroperitoneal hematoma. 4. Aortic Atherosclerosis (ICD10-I70.0). Electronically Signed   By: Richardean Sale M.D.   On: 01/30/2020 14:36   CT ABDOMEN PELVIS W CONTRAST  Result Date: 01/30/2020 CLINICAL DATA:  Right M1 occlusion with left superior sulcus lung mass on neck CTA. Neuro interventional procedure yesterday. Concern for retroperitoneal hemorrhage and malignancy. EXAM: CT CHEST, ABDOMEN, AND PELVIS WITH CONTRAST TECHNIQUE: Multidetector CT imaging of the chest, abdomen and pelvis was performed following the standard protocol during bolus administration of intravenous contrast.  CONTRAST:  85mL OMNIPAQUE IOHEXOL 300 MG/ML  SOLN COMPARISON:  Neck CT 01/29/2020. Chest and abdominal radiographs today. FINDINGS: CT CHEST FINDINGS Cardiovascular: Diffuse atherosclerosis of the aorta, great vessels and coronary arteries. No acute vascular findings are seen. The heart size is normal. There is no pericardial effusion. Mediastinum/Nodes: There are small AP window lymph nodes measuring up to 12 mm short axis on image 27/3. No other enlarged mediastinal, hilar or axillary lymph nodes are identified. There are surgical clips in the right axilla. Endotracheal and nasogastric tubes are in place. The thyroid gland, trachea and esophagus demonstrate no significant findings. Lungs/Pleura: Again demonstrated is a large left pleural effusion with near complete opacification of the left hemithorax. The left lower lobe is completely collapsed, and there is partial collapse of the left upper lobe. There is persistent concern of a left apical mass measuring 3.5 x 3.3 cm on image 17/3. No definite chest wall invasion. There is no significant right pleural effusion or suspicious right lung nodularity. Dependent opacities in the right lower lobe likely represent atelectasis or inflammation. Musculoskeletal/Chest wall: No chest wall mass or suspicious osseous findings. There are degenerative changes throughout the spine associated with a convex right thoracolumbar scoliosis. CT ABDOMEN AND PELVIS FINDINGS Hepatobiliary: Hepatic evaluation is limited by scanning prior to opacification of the hepatic veins. This likely accounts for mild heterogeneity of the hepatic parenchyma, and no focal lesions are seen on the delayed images obtained through the kidneys. The gallbladder is contracted or surgically absent without associated surgical clips. There is no biliary dilatation. Pancreas: Unremarkable. No pancreatic ductal dilatation or surrounding inflammatory changes. Spleen: Mild heterogeneity of the spleen on the early  postcontrast images does not persist on the delayed images, likely perfusion anomalies. The spleen is normal in size. Adrenals/Urinary Tract: Both adrenal glands appear normal. Left renal cortical scarring. No evidence of renal mass, urinary tract calculus or hydronephrosis. The bladder is decompressed by a Foley catheter. Stomach/Bowel: No evidence of bowel wall thickening, distention or surrounding inflammatory change. Enteric tube is looped in the distal stomach. Vascular/Lymphatic: There are no enlarged abdominal or pelvic lymph nodes. Aortic and branch vessel atherosclerosis without acute vascular findings. There is no evidence of retroperitoneal hematoma. The portal, superior mesenteric and splenic veins appear patent. Reproductive: Multiple calcified uterine fibroids.  No adnexal mass. Other: No ascites or free intraperitoneal air. Mild soft tissue stranding in the right groin without significant hematoma. Musculoskeletal: No acute or significant osseous findings. Lumbar facet  arthropathy noted. IMPRESSION: 1. Persistent large left pleural effusion with near complete left lung collapse. There is persistent concern of a left apical mass measuring 3.5 x 3.3 cm, suspicious for bronchogenic carcinoma. Mildly enlarged AP window lymph nodes are nonspecific. Recommend thoracentesis and follow up thoracic CT. 2. No evidence of distant metastatic disease. 3. No acute findings in the abdomen or pelvis. No evidence of retroperitoneal hematoma. 4. Aortic Atherosclerosis (ICD10-I70.0). Electronically Signed   By: Richardean Sale M.D.   On: 01/30/2020 14:36   Portable Chest xray  Result Date: 01/30/2020 CLINICAL DATA:  Hypoxia EXAM: PORTABLE CHEST 1 VIEW COMPARISON:  January 29, 2020 FINDINGS: Endotracheal tube tip is 3.7 cm above the carina. Nasogastric tube tip and side port are below the diaphragm. No pneumothorax. There is again noted a large left pleural effusion with suspected atelectasis and consolidation  throughout much of the left lung. These findings in combination the to opacification of much of the left hemithorax. The right lung is clear. Heart size as normal. The pulmonary vascularity appears grossly normal. No adenopathy evident. There is aortic atherosclerosis. There is upper lumbar levoscoliosis. IMPRESSION: Tube positions as described without pneumothorax. Large persistent left pleural effusion with suspected atelectasis and consolidation throughout much of the left lung as well. Right lung clear. Stable cardiac silhouette. Aortic Atherosclerosis (ICD10-I70.0). Electronically Signed   By: Lowella Grip III M.D.   On: 01/30/2020 07:54   ECHOCARDIOGRAM COMPLETE  Result Date: 01/30/2020    ECHOCARDIOGRAM REPORT   Patient Name:   Amber Lopez Date of Exam: 01/30/2020 Medical Rec #:  [id number]      Height:       58.0 in Accession #:    [id number]     Weight:       118.4 lb Date of Birth:  Nov 26, 1940       BSA:          1.457 m Patient Age:    18 years       BP:           131/44 mmHg Patient Gender: F              HR:           58 bpm. Exam Location:  Inpatient Procedure: 2D Echo, Cardiac Doppler and Color Doppler Indications:    Stroke  History:        Patient has no prior history of Echocardiogram examinations.                 Risk Factors:Dyslipidemia, Diabetes and Hypertension.  Sonographer:    Clayton Lefort RDCS (AE) Referring Phys: [id number] ERIC LINDZEN  Sonographer Comments: Echo performed with patient supine and on artificial respirator. IMPRESSIONS  1. Left ventricular ejection fraction, by estimation, is 55 to 60%. The left ventricle has normal function. The left ventricle has no regional wall motion abnormalities. There is mild concentric left ventricular hypertrophy. Left ventricular diastolic parameters are consistent with Grade I diastolic dysfunction (impaired relaxation).  2. Right ventricular systolic function is low normal. The right ventricular size is normal.  3. Large pleural effusion in  the left lateral region.  4. The mitral valve is normal in structure. No evidence of mitral valve regurgitation.  5. The aortic valve is normal in structure. Aortic valve regurgitation is not visualized. No aortic stenosis is present. FINDINGS  Left Ventricle: Left ventricular ejection fraction, by estimation, is 55 to 60%. The left ventricle has normal function. The left ventricle has no regional  wall motion abnormalities. Definity contrast agent was given IV to delineate the left ventricular  endocardial borders. The left ventricular internal cavity size was normal in size. There is mild concentric left ventricular hypertrophy. Left ventricular diastolic parameters are consistent with Grade I diastolic dysfunction (impaired relaxation). Right Ventricle: The right ventricular size is normal. No increase in right ventricular wall thickness. Right ventricular systolic function is low normal. Left Atrium: Left atrial size was normal in size. Right Atrium: Right atrial size was normal in size. Pericardium: There is no evidence of pericardial effusion. Mitral Valve: The mitral valve is normal in structure. No evidence of mitral valve regurgitation. MV peak gradient, 3.3 mmHg. The mean mitral valve gradient is 1.0 mmHg. Tricuspid Valve: The tricuspid valve is grossly normal. Tricuspid valve regurgitation is trivial. Aortic Valve: The aortic valve is normal in structure. Aortic valve regurgitation is not visualized. No aortic stenosis is present. Aortic valve mean gradient measures 2.0 mmHg. Aortic valve peak gradient measures 4.5 mmHg. Aortic valve area, by VTI measures 2.25 cm. Pulmonic Valve: The pulmonic valve was normal in structure. Pulmonic valve regurgitation is not visualized. Aorta: The aortic root and ascending aorta are structurally normal, with no evidence of dilitation. IAS/Shunts: The atrial septum is grossly normal. Additional Comments: There is a large pleural effusion in the left lateral region.  LEFT  VENTRICLE PLAX 2D LVIDd:         2.10 cm  Diastology LVIDs:         1.50 cm  LV e' medial:    2.54 cm/s LV PW:         1.20 cm  LV E/e' medial:  31.6 LV IVS:        1.30 cm  LV e' lateral:   3.57 cm/s LVOT diam:     1.60 cm  LV E/e' lateral: 22.5 LV SV:         39 LV SV Index:   27 LVOT Area:     2.01 cm  RIGHT VENTRICLE RV Basal diam:  1.60 cm RV S prime:     7.42 cm/s TAPSE (M-mode): 1.6 cm LEFT ATRIUM             Index       RIGHT ATRIUM          Index LA diam:        2.10 cm 1.44 cm/m  RA Area:     7.15 cm LA Vol (A2C):   35.5 ml 24.36 ml/m RA Volume:   13.00 ml 8.92 ml/m LA Vol (A4C):   28.6 ml 19.63 ml/m LA Biplane Vol: 33.5 ml 22.99 ml/m  AORTIC VALVE AV Area (Vmax):    1.93 cm AV Area (Vmean):   1.97 cm AV Area (VTI):     2.25 cm AV Vmax:           106.00 cm/s AV Vmean:          62.300 cm/s AV VTI:            0.174 m AV Peak Grad:      4.5 mmHg AV Mean Grad:      2.0 mmHg LVOT Vmax:         102.00 cm/s LVOT Vmean:        61.100 cm/s LVOT VTI:          0.195 m LVOT/AV VTI ratio: 1.12  AORTA Ao Root diam: 2.90 cm MITRAL VALVE MV Area (PHT): 3.08 cm    SHUNTS MV  Peak grad:  3.3 mmHg    Systemic VTI:  0.20 m MV Mean grad:  1.0 mmHg    Systemic Diam: 1.60 cm MV Vmax:       0.91 m/s MV Vmean:      46.6 cm/s MV Decel Time: 246 msec MV E velocity: 80.20 cm/s MV A velocity: 99.80 cm/s MV E/A ratio:  0.80 Mertie Moores MD Electronically signed by Mertie Moores MD Signature Date/Time: 01/30/2020/12:41:27 PM    Final     PHYSICAL EXAM  Temp:  [96.7 F (35.9 C)-98.1 F (36.7 C)] 97.3 F (36.3 C) (10/09 0600) Pulse Rate:  [58-87] 67 (10/09 0600) Resp:  [16-28] 22 (10/09 0141) BP: (66-156)/(44-135) 140/62 (10/09 0600) SpO2:  [96 %-100 %] 99 % (10/09 0600) Arterial Line BP: (109-168)/(46-71) 163/66 (10/09 0600) FiO2 (%):  [40 %] 40 % (10/09 0142)  General - Well nourished, well developed, intubated on sedation.  Ophthalmologic - fundi not visualized due to noncooperation.  Cardiovascular -  Regular rate and rhythm.  Neuro - intubated on propofol, eyes open, not following commands. Eye in right gaze position, not cross midline, not blinking to visual threat bilaterally, doll's eyes sluggish, not tracking on the left visual field but seems able to track on the right, PERRL. Corneal reflex present, gag and cough present. Breathing over the vent.  Facial symmetry not able to test due to ET tube.  Tongue protrusion not cooperative. Spontaneous movement of RUE and RLE and localize to pain, RUE 3/5 at least and RLE 3-/5 at least. LUE and LLE not spontaneous movement, but mild withdraw to pain. DTR 1+ and no babinski. Sensation, coordination and gait not tested.   ASSESSMENT/PLAN Amber Lopez is a 79 y.o. female with history of depression, HLD, HIV, HTN and DM presenting with left hemiplegia, left facial droop, right eye deviation and aphasia. Received IV tPA 01/29/2020 at 0816.  Stroke:  R MCA and small L cerebellar infarcts s/p tPA + IR R M1 occlusion w/ TICI2c revascularization, embolic secondary to unknown source  Code Stroke CT head No acute abnormality. Atrophy. ASPECTS 10.     CTA head & neck LVO R M1. Atherosclerosis. L superior sulcus mass w/ large pleural effusion malignant appearing w/ associated mediastinal adenopathy.  Cerebral angio / IR - R M1 occlusion s/p TICI2c revascularization using Tiger x 1, embotrap x 1, solitaire x 2.  Post IR CT - no ICH, contrast stain R basal ganglia    MRI  R MCA multifocal infarct w/ mild petechial hemorrhage. Small L cerebellar infarct. Atrophy,  2D Echo EF 55 to 60%, large pleural effusion on the left  LDL 62  HgbA1c 6.5  VTE prophylaxis - SCDs-> Heparin 5000 units sq Q12  No antithrombotic prior to admission, now on No antithrombotic due to severe anemia needing blood transfusion  Therapy recommendations:  pending   Disposition:  pending   Acute Respiratory Failure  Secondary to stroke  Intubated for IR, remains  intubated  Sedated  CXR w/ persistent large L pleural effusion w/ atx and consolidation  CCM on board   Severe anemia   Acute blood loss anemia s/p IR, Hb 13.2->9.2->8.2->6.5  PRBC transfusion  CT abd/pelvis no retroperitoneal hemorrhage  Right groin oozing from sheath site - compressed - no active bleeding now  CBC monitoring  CCM on board  Large L lung mass  CTA neck - L superior sulcus mass w/ large pleural effusion malignant appearing w/ associated mediastinal adenopathy.  CT chest large  left pleural effusion with near complete left lung collapse.  Left apical mass suspicious for bronchogenic carcinoma  CT abd/pelvis no distal metastasis disease  CCM is considering thoracentesis in a.m. once stable  Hypotension/Hypertension  Home meds:  hctz 25, metoprolol 25  On phenylephepdirine  . BP goal normotensive  Diabetes type II Controlled  Home meds:  None listed  HgbA1c 6.5, goal < 7.0  CBGs  SSI  Dysphagia . Secondary to stroke . NPO . No oral access    HIV  HARRP on Biktarvy.   CD4 in 07/2019 242  Current CD4 pending   Other Stroke Risk Factors  Advanced age  Migraines  Other Active Problems  Depression / anxiety  Hx right breast cancer on arimidex  Post IR groin bleeding. Sheath removed using quick clot and pressure held. Stable  Hospital day # 2  This patient is critically ill due to severe anemia needing blood transfusion, right MCA occlusion status post TPA and thrombectomy, right MCA stroke, respirate failure need intubation, hypotension on pressor, left lung mass and at significant risk of neurological worsening, death form sepsis, hypovolemic shock, recurrent stroke, hemorrhagic conversion, seizure. This patient's care requires constant monitoring of vital signs, hemodynamics, respiratory and cardiac monitoring, review of multiple databases, neurological assessment, discussion with family, other specialists and medical decision  making of high complexity. I spent 45 minutes of neurocritical care time in the care of this patient. I had long discussion with daughter and son over the phone, updated pt current condition, treatment plan and potential prognosis, and answered all the questions.  They expressed understanding and appreciation.  I discussed with Dr. Pearline Cables CCM service.    To contact Stroke Continuity provider, please refer to [email or web address] After hours, contact General Neurology

## 2020-01-31 NOTE — Progress Notes (Signed)
NAME:  Amber Lopez, MRN:  [id number], DOB:  09-19-1940, LOS: 2 ADMISSION DATE:  01/29/2020, CONSULTATION DATE:  10/7 REFERRING MD: Dr. Estanislado Pandy, CHIEF COMPLAINT: Left Sided Weakness  Brief History   Amber Lopez, with HIV, admitted 10/7 with LVO of R M1 s/p tPA, neuro IR revascularization.  Returned to ICU post procedure on mechanical ventilation. Additional finding of left superior sulcus mass and complex effusion.   History of present illness   Amber Lopez who presented to [facility] on 10/7 with acute onset left sided weakness, facial droop, aphasia and right eye deviation. She was last known normal at 0640 when her daughter assisted her with am medications.  The patient was in bed but reportedly normal at that time.  Her daughter left and returned home finding her mother with weakness on the left side and unable to speak.  EMS was activated and CODE STROKE was activated in the field.  On arrival to the ER the patient was documented to have left sided hemiparesis, left facial droop, non-verbal and right gaze deviation. Initial CT of the head was negative for acute findings.  CTA head/neck demonstrated  Past Medical History  HIV/AIDS, HTN, HLD, DM, Depression , Arthritis, Breast Cancer   Significant Hospital Events   10/07 Admit with L hemiplegia, facial droop, aphasia with right eye deviation  Consults:  Neurology, IR   Procedures:  ETT 10/7 >> L Radial ALine 10/7 >>   R Femoral Sheath 10/7 >>   Significant Diagnostic Tests:  10/7 CT Head Code Stroke >> no acute finding, generalized atrophy  10/7 CTA Head/Neck >> emergent LVO at the right M1 segment, no visible embolic source, atherosclerosis without flow limiting stenosis or ulceration of major vessels, left superior sulcus mass with large complex pleural effusion, associated mediastinal adenopathy  10/7 CT ABD w/contrast >> Negative for hematoma 10/7 CT Chest w/contrast >> Persistent large left pleural effusion with near complete left lung  collapse. Left apical mass measuring 3.5 x 3.3 cm suspicious for bronchogenic carcinoma  Micro Data:  COVID 10/7 >> negative  Influenza A/B 10/7 >> negative  Antimicrobials:  N/A  Interim history/subjective:   No overnight events. Patient's vital signs have remained within goal. RN reports improvements in urine output since shift began this morning.   Objective   Blood pressure 140/62, pulse 67, temperature (!) 97.3 Lopez (36.3 C), resp. rate (!) 22, height 4\' 10"  (1.473 m), weight 53.7 kg, SpO2 99 %.    Vent Mode: PRVC FiO2 (%):  [40 %] 40 % Set Rate:  [16 bmp] 16 bmp Vt Set:  [330 mL] 330 mL PEEP:  [5 cmH20] 5 cmH20 Plateau Pressure:  [18 cmH20-21 cmH20] 21 cmH20   Intake/Output Summary (Last 24 hours) at 01/31/2020 0757 Last data filed at 01/31/2020 0600 Gross per 24 hour  Intake 4653.Amber ml  Output 350 ml  Net 4303.Amber ml   Filed Weights   01/29/20 0700  Weight: 53.7 kg    Examination:  General: cachectic, elderly adult female. No acute distress.  HEENT: MM pink/moist, ETT, PERRL Neuro: Sedated this morning but responds to sternal rub. Does not respond to painful stimuli on left-sided extremities. Will re-assess off sedation  CV: Regular rate and rhythm. No murmurs. Normal S1S2 PULM: Patient is on ventilator. Breathe sounds diminished on left.  GI: Soft with normoactive bowel sounds.   Extremities: Warm and dry. No edema of the lower extremities.  Skin: Right femoral access site clean and dry without active bleeding.  No surrounding ecchymosis. No other petechiae, purpura or ecchymosis.   Resolved Hospital Problem list   N/A  Assessment & Plan:   # Stroke in setting of Large Vessel Occlusion of Right M1 Segment - Embolic vs Ischemic - S/P tPA (~26 hours ago) and endovascular revascularization. No ICH post procedure on CT, contrast stain in the right basal ganglia.   - Neurology and IR following - Follow up MRI on 10/8 shows multifocal infarct with mild petechial  hemorrhage with small left cerebellar infarct. This is concerning for an embolic source. No ICH noted on MRI this AM.  - TTE negative for vegetations or thrombosis  - Continue statin with goal LDL < 62, patient is at goal - Glycemic control with goal A1c < 7.0% - SBP goal 120 -160 - Frequent neuro exams - Given acute bleed seems to have resolved, discussed with Neurology to restart Aspirin.  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 High likelihood of malignancy - CT chest demonstrates 3.5 x 3.3 cm mass concerning for bronchogenic carcinoma. No evidence of distant metastatic disease.  - Plan for chest tube today for diagnostics. - May need FOB vs CT guided FNA for tissue sampling as well  # Acute Kidney Injury  - Creatinine on admission 1.69. Improved to 1.47 since then with IVF - FENa < 0.1 consistent with pre-renal  - UOP continues to be low though with only 200 cc in the past 12 hours  - Continue with NS @ 125 cc/hr  # HIV/AIDS - Restarted Descovy and Tivicay - CD4 count ordered and pending  # T2DM  - A1c of 6.5% - Considering adding SSI if glucose consistently >180  # At Risk Malnutrition  - Will consider starting tube feeds today   # Hx of Breast Cancer  - Hold home anastrozole   # Hypokalemia - Monitor daily - Replenish PRN.  Best practice:  Diet: NPO Pain/Anxiety/Delirium protocol (if indicated): Ordered VAP protocol (if indicated): In place  DVT prophylaxis: SCD's  GI prophylaxis: PPI  Glucose control: n/a    Mobility: Bed Rest  Code Status: Full Code  Family Communication: Per primary  Disposition: ICU   Labs   CBC: Recent Labs  Lab 01/29/20 0754 01/29/20 0759 01/30/20 0500 01/30/20 1021 01/30/20 1042 01/30/20 1122 01/30/20 1819 01/31/20 0512  WBC 9.1  --  10.6*  --   --  9.7 8.6 10.0  NEUTROABS 7.1  --  8.7*  --   --   --   --   --   HGB 13.2   < > 8.2*  --  6.5* 6.6* 10.2* 10.8*  HCT 42.3   < > 24.8*  --  19.0* 20.7* 31.3* 33.0*  MCV 94.6  --  92.5  --   --  93.2 89.7 90.2  PLT 271   < > 261 213  --  196 160 184   < > = values in this interval not displayed.    Basic  Metabolic Panel: Recent Labs  Lab 01/29/20 0754 01/29/20 0754 01/29/20 0759 01/29/20 0759 01/29/20 1425 01/29/20 1637 01/30/20 0500 01/30/20 1042 01/31/20 0512  NA 141   < > 140   < > 138 140 141 145 141  K 4.3   < > 4.0   < > 3.3* 3.5 3.1* 3.0* 3.8  CL 99  --  101  --   --   --  112*  --  117*  CO2 25  --   --   --   --   --  17*  --  14*  GLUCOSE 153*  --  151*  --   --   --  140*  --  121*  BUN 29*  --  32*  --   --   --  27*  --  21  CREATININE 1.69*  --  1.60*  --   --   --  1.55*  --  1.47*  CALCIUM 9.5  --   --   --   --   --  7.4*  --  7.3*   < > = values in this interval not displayed.   GFR: Estimated Creatinine Clearance: 22.5 mL/min (A) (by C-G formula based on SCr of 1.47 mg/dL (H)). Recent Labs  Lab 01/30/20 0500 01/30/20 1122 01/30/20 1819 01/31/20 0512  WBC 10.6* 9.7 8.6 10.0    Liver Function Tests: Recent Labs  Lab 01/29/20 0754  AST 21  ALT 12  ALKPHOS 80  BILITOT 1.0  PROT 7.7  ALBUMIN 2.8*   No results for input(s): LIPASE, AMYLASE in the last 168 hours. No results for input(s): AMMONIA in the last 168 hours.  ABG    Component Value Date/Time   PHART 7.419 01/30/2020 1042   PCO2ART 26.5 (L) 01/30/2020 1042   PO2ART 143 (H) 01/30/2020 1042   HCO3 17.3 (L) 01/30/2020 1042   TCO2 18 (L) 01/30/2020 1042   ACIDBASEDEF 7.0 (H) 01/30/2020 1042   O2SAT 99.0  01/30/2020 1042     Coagulation Profile: Recent Labs  Lab 01/29/20 0754 01/30/20 1021  INR 1.2 1.5*    Cardiac Enzymes: No results for input(s): CKTOTAL, CKMB, CKMBINDEX, TROPONINI in the last 168 hours.  HbA1C: Hgb A1c MFr Bld  Date/Time Value Ref Range Status  01/29/2020 04:22 PM 6.5 (H) 4.8 - 5.6 % Final    Comment:    (NOTE) Pre diabetes:          5.7%-6.4%  Diabetes:              >6.4%  Glycemic control for   <7.0% adults with diabetes     CBG: Recent Labs  Lab 01/30/20 1151 01/30/20 1608 01/30/20 1915 01/30/20 2311 01/31/20 0319  GLUCAP 118* 119* 135* 132* 97    Review of Systems:   Unable to complete as patient is altered on mechanical ventilation.   Past Medical History  She,  has a past medical history of Arthritis, Depression, Diabetes mellitus without complication ([place]), Eating disorder, Hyperlipidemia, Hypertension, and Migraines.   Surgical History    Past Surgical History:  Procedure Laterality Date  . BREAST BIOPSY    . BREAST LUMPECTOMY Right    Years ago / Pt's daughter thinks it was cancer  . RADIOLOGY WITH ANESTHESIA N/A 01/29/2020   Procedure: IR WITH ANESTHESIA - CODE STROKE;  Surgeon: Radiologist, Medication, MD;  Location: [place];  Service: Radiology;  Laterality: N/A;     Social History   reports that she  has never smoked. She has never used smokeless tobacco. She reports that she does not drink alcohol and does not use drugs.   Family History   Her family history includes Breast cancer (age of onset: 90) in her sister.   Allergies Allergies  Allergen Reactions  . Sulfa Antibiotics Swelling and Rash     Home Medications  Prior to Admission medications   Medication Sig Start Date End Date Taking? Authorizing Provider  amoxicillin-clavulanate (AUGMENTIN) 875-125 MG tablet Take 1 tablet by mouth 2 (two) times daily. 01/26/20   Billie Ruddy, MD  anastrozole (ARIMIDEX) 1 MG tablet Take 1 mg by mouth daily.    [provider]  BIKTARVY 50-200-25 MG TABS tablet TAKE 1 TABLET BY MOUTH DAILY 01/27/20   Golden Circle, FNP  hydrochlorothiazide (HYDRODIURIL) 25 MG tablet TAKE 1 TABLET(25 MG) BY MOUTH DAILY 01/26/20   Billie Ruddy, MD  MELATONIN PO Take 6 mg by mouth at bedtime.    [provider]  metoprolol succinate (TOPROL-XL) 25 MG 24 hr tablet Take 1 tablet (25 mg total) by mouth daily. 12/31/19   Billie Ruddy, MD  potassium chloride SA (KLOR-CON) 20 MEQ tablet TAKE 1 TABLET(20 MEQ) BY MOUTH DAILY 01/26/20   Billie Ruddy, MD    Dr. Jose Persia Internal Medicine PGY-2  Pager: [phone]  01/31/2020, 7:57 AM

## 2020-01-31 NOTE — Procedures (Signed)
Left chest tube placement Indication: Large left pleural effusion on the left apical mass Procedure: I had orally obtained permission from the patient's daughter to perform the procedure on the evening of 10/8. Skin of the lateral left chest was extensively prepped with chlorhexidine and sterilely draped.  Sterile garb was donned.  The skin was infiltrated with 1% lidocaine just below the fifth rib in the midaxillary line.  The skin was sharply incised and blunt dissection was performed over the fifth rib.  Immediately on entering the pleural space some slightly blood-tinged serous fluid was obtained.  A 20 French chest tube was placed and approximately 600 cc of fluid drained.  Fluid was submitted for cytology and standard laboratories.  Chest x-ray following the procedure shows a central complete resolution of the left-sided pleural effusion.

## 2020-02-01 DIAGNOSIS — I639 Cerebral infarction, unspecified: Secondary | ICD-10-CM | POA: Diagnosis not present

## 2020-02-01 DIAGNOSIS — I6601 Occlusion and stenosis of right middle cerebral artery: Secondary | ICD-10-CM | POA: Diagnosis not present

## 2020-02-01 LAB — BASIC METABOLIC PANEL
Anion gap: 12 (ref 5–15)
BUN: 15 mg/dL (ref 8–23)
CO2: 15 mmol/L — ABNORMAL LOW (ref 22–32)
Calcium: 8 mg/dL — ABNORMAL LOW (ref 8.9–10.3)
Chloride: 116 mmol/L — ABNORMAL HIGH (ref 98–111)
Creatinine, Ser: 1.31 mg/dL — ABNORMAL HIGH (ref 0.44–1.00)
GFR, Estimated: 39 mL/min — ABNORMAL LOW (ref >60)
Glucose, Bld: 143 mg/dL — ABNORMAL HIGH (ref 70–99)
Potassium: 4.8 mmol/L (ref 3.5–5.1)
Sodium: 143 mmol/L (ref 135–145)

## 2020-02-01 LAB — GLUCOSE, CAPILLARY
Glucose-Capillary: 117 mg/dL — ABNORMAL HIGH (ref 70–99)
Glucose-Capillary: 119 mg/dL — ABNORMAL HIGH (ref 70–99)
Glucose-Capillary: 120 mg/dL — ABNORMAL HIGH (ref 70–99)
Glucose-Capillary: 141 mg/dL — ABNORMAL HIGH (ref 70–99)
Glucose-Capillary: 145 mg/dL — ABNORMAL HIGH (ref 70–99)
Glucose-Capillary: 220 mg/dL — ABNORMAL HIGH (ref 70–99)

## 2020-02-01 LAB — CBC
HCT: 35.5 % — ABNORMAL LOW (ref 36.0–46.0)
Hemoglobin: 11.3 g/dL — ABNORMAL LOW (ref 12.0–15.0)
MCH: 28.6 pg (ref 26.0–34.0)
MCHC: 31.8 g/dL (ref 30.0–36.0)
MCV: 89.9 fL (ref 80.0–100.0)
Platelets: 211 10*3/uL (ref 150–400)
RBC: 3.95 MIL/uL (ref 3.87–5.11)
RDW: 15.5 % (ref 11.5–15.5)
WBC: 14.1 10*3/uL — ABNORMAL HIGH (ref 4.0–10.5)
nRBC: 0.9 % — ABNORMAL HIGH (ref 0.0–0.2)

## 2020-02-01 LAB — TRIGLYCERIDES: Triglycerides: 152 mg/dL — ABNORMAL HIGH (ref <150)

## 2020-02-01 MED ORDER — FENTANYL CITRATE (PF) 100 MCG/2ML IJ SOLN
50.0000 ug | INTRAMUSCULAR | Status: DC | PRN
  Administered 2020-02-01 – 2020-02-19 (×45): 50 ug via INTRAVENOUS
  Filled 2020-02-01 (×45): qty 2

## 2020-02-01 MED ORDER — PROSOURCE TF PO LIQD
45.0000 mL | Freq: Every day | ORAL | Status: DC
  Administered 2020-02-01 – 2020-02-02 (×2): 45 mL
  Filled 2020-02-01 (×2): qty 45

## 2020-02-01 MED ORDER — OSMOLITE 1.2 CAL PO LIQD
1000.0000 mL | ORAL | Status: DC
  Administered 2020-02-01: 1000 mL
  Filled 2020-02-01: qty 1000

## 2020-02-01 MED ORDER — ROSUVASTATIN CALCIUM 5 MG PO TABS
10.0000 mg | ORAL_TABLET | Freq: Every day | ORAL | Status: DC
  Administered 2020-02-01 – 2020-03-22 (×51): 10 mg via NASOGASTRIC
  Filled 2020-02-01 (×52): qty 2

## 2020-02-01 MED ORDER — LABETALOL HCL 5 MG/ML IV SOLN
5.0000 mg | INTRAVENOUS | Status: DC | PRN
  Filled 2020-02-01: qty 4

## 2020-02-01 NOTE — Plan of Care (Signed)
  Problem: Clinical Measurements: Goal: Diagnostic test results will improve Outcome: Progressing   

## 2020-02-01 NOTE — Progress Notes (Signed)
NAME:  Amber Lopez, MRN:  [id number], DOB:  1941/04/21, LOS: 3 ADMISSION DATE:  01/29/2020, CONSULTATION DATE:  10/7 REFERRING MD: Dr. Estanislado Pandy, CHIEF COMPLAINT: Left Sided Weakness  Brief History   79 y/o F, with HIV, admitted 10/7 with LVO of R M1 s/p tPA, neuro IR revascularization.  Returned to ICU post procedure on mechanical ventilation. Additional finding of left superior sulcus mass and complex effusion.   History of present illness   79 y/o F who presented to [facility] on 10/7 with acute onset left sided weakness, facial droop, aphasia and right eye deviation. She was last known normal at 0640 when her daughter assisted her with am medications.  The patient was in bed but reportedly normal at that time.  Her daughter left and returned home finding her mother with weakness on the left side and unable to speak.  EMS was activated and CODE STROKE was activated in the field.  On arrival to the ER the patient was documented to have left sided hemiparesis, left facial droop, non-verbal and right gaze deviation. Initial CT of the head was negative for acute findings.  CTA head/neck demonstrated  Past Medical History  HIV/AIDS, HTN, HLD, DM, Depression , Arthritis, Breast Cancer   Significant Hospital Events   10/07 Admit with L hemiplegia, facial droop, aphasia with right eye deviation  Consults:  Neurology, IR   Procedures:  ETT 10/7 >> L Radial ALine 10/7 >>   R Femoral Sheath 10/7 >>   Significant Diagnostic Tests:  10/7 CT Head Code Stroke >> no acute finding, generalized atrophy  10/7 CTA Head/Neck >> emergent LVO at the right M1 segment, no visible embolic source, atherosclerosis without flow limiting stenosis or ulceration of major vessels, left superior sulcus mass with large complex pleural effusion, associated mediastinal adenopathy  10/7 CT ABD w/contrast >> Negative for hematoma 10/7 CT Chest w/contrast >> Persistent large left pleural effusion with near complete left lung  collapse. Left apical mass measuring 3.5 x 3.3 cm suspicious for bronchogenic carcinoma  Micro Data:  COVID 10/7 >> negative  Influenza A/B 10/7 >> negative  Antimicrobials:  N/A  Interim history/subjective:   She was given 100 mcg of fentanyl for agitation just prior to my visit.  She has had no significant events overnight.  She remains intubated and mechanically ventilated but on minimal ventilator settings.  Objective   Blood pressure (!) 150/57, pulse (!) 106, temperature 98.6 F (37 C), resp. rate (!) 21, height 4\' 10"  (1.473 m), weight 53.7 kg, SpO2 100 %.    Vent Mode: PRVC FiO2 (%):  [40 %] 40 % Set Rate:  [15 bmp-16 bmp] 16 bmp Vt Set:  [330 mL] 330 mL PEEP:  [5 cmH20] 5 cmH20 Plateau Pressure:  [18 cmH20-19 cmH20] 18 cmH20   Intake/Output Summary (Last 24 hours) at 02/01/2020 0820 Last data filed at 02/01/2020 0500 Gross per 24 hour  Intake 2670.12 ml  Output 2130 ml  Net 540.12 ml   Filed Weights   01/29/20 0700  Weight: 53.7 kg    Examination:  General: Cachectic elderly female in no distress.    HEENT:  Neuro: No response to voice.  She partially eye opens in response to sternal rub and moves the right upper extremity purposefully there is no movement on the left.  Pupils are 2 mm and equal.   CV: S1 and S2 are regular without murmur rub or gallop.   PULM: Respirations are unlabored on CPAP with a pressure support  of 12.  There is symmetric air movement no wheezes.  There continues to be some drainage from the left-sided chest tube.  [name] Kitchen  GI: Soft without organomegaly masses tenderness guarding or rebound    Extremities: No edema, the limbs are warm .  Skin:    Resolved Hospital Problem list   N/A  Assessment & Plan:   # Stroke in setting of Large Vessel Occlusion of Right M1 Segment - Embolic vs Ischemic - S/P tPA and endovascular revascularization. No ICH post procedure on CT, contrast stain in the right basal ganglia.   - Neurology and IR following  - Follow up MRI on 10/8 shows multifocal infarct with mild petechial hemorrhage with small left cerebellar infarct. This is concerning for an embolic source. No ICH noted on MRI this AM.  - TTE negative for vegetations or thrombosis  -Statin has been introduced for secondary prevention and she continues on aspirin. - Glycemic control with goal A1c < 7.0% - SBP goal 120 -160 - Frequent neuro exams   # Acute  Anemia  -Markably we did not find a site of blood loss.  There was no hematoma Ephriam Jenkins in the groin access site noted we see evidence of a retroperitoneal hematoma or thigh hematoma on CT.  Chest tube drainage was mostly bloody.  It would appear that there was a significant dilutional component.    # Labile Hypertension  - Labile BP during / post procedure - Hold home HCTZ, Metoprolol  -A as needed dose of labetalol has been ordered    # Acute Respiratory Insufficiency in setting of R M1 CVA -She is actually quite easy to oxygenate and ventilate.  I am in favor of further decreasing her ventilatory support, current mental status precludes extubation.   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 High likelihood of malignancy - CT chest demonstrates 3.5 x 3.3 cm mass concerning for bronchogenic carcinoma. No evidence of distant metastatic disease.  -We are awaiting cytology on her pleural fluid if negative we will need to pursue other means of diagnosis   # Acute Kidney Injury  - Creatinine on admission 1.69. Improved to 1.47 since then with IVF - FENa < 0.1 consistent with pre-renal  - UOP continues to be low though with only 200 cc in the past 12 hours  - Continue with NS @ 125 cc/hr  # HIV/AIDS - Restarted Descovy and Tivicay - CD4 count ordered and pending  # T2DM  - A1c of 6.5% - Considering adding SSI if glucose consistently >180  # At Risk Malnutrition  - Will consider starting  tube feeds today   # Hx of Breast Cancer  - Hold home anastrozole   # Hypokalemia - Monitor daily - Replenish PRN.   Best practice:  Diet: NPO Pain/Anxiety/Delirium protocol (if indicated): Ordered VAP protocol (if indicated): In place  DVT prophylaxis: SCD's  GI prophylaxis: PPI  Glucose control: n/a  Mobility: Bed Rest  Code Status: Full Code  Family Communication: I spoke with the patient's son yesterday he is in favor of establishing a diagnosis of the left apical mass before making any sort of decisions about disposition.     Labs   CBC: Recent Labs  Lab 01/29/20 0754 01/29/20 0759 01/30/20 0500 01/30/20 1021 01/30/20 1042 01/30/20 1122 01/30/20 1819 01/31/20 0512  WBC 9.1  --  10.6*  --   --  9.7  8.6 10.0  NEUTROABS 7.1  --  8.7*  --   --   --   --   --   HGB 13.2   < > 8.2*  --  6.5* 6.6* 10.2* 10.8*  HCT 42.3   < > 24.8*  --  19.0* 20.7* 31.3* 33.0*  MCV 94.6  --  92.5  --   --  93.2 89.7 90.2  PLT 271   < > 261 213  --  196 160 184   < > = values in this interval not displayed.    Basic Metabolic Panel: Recent Labs  Lab 01/29/20 0754 01/29/20 0754 01/29/20 0759 01/29/20 0759 01/29/20 1425 01/29/20 1637 01/30/20 0500 01/30/20 1042 01/31/20 0512  NA 141   < > 140   < > 138 140 141 145 141  K 4.3   < > 4.0   < > 3.3* 3.5 3.1* 3.0* 3.8  CL 99  --  101  --   --   --  112*  --  117*  CO2 25  --   --   --   --   --  17*  --  14*  GLUCOSE 153*  --  151*  --   --   --  140*  --  121*  BUN 29*  --  32*  --   --   --  27*  --  21  CREATININE 1.69*  --  1.60*  --   --   --  1.55*  --  1.47*  CALCIUM 9.5  --   --   --   --   --  7.4*  --  7.3*   < > = values in this interval not displayed.   GFR: Estimated Creatinine Clearance: 22.5 mL/min (A) (by C-G formula based on SCr of 1.47 mg/dL (H)). Recent Labs  Lab 01/30/20 0500 01/30/20 1122 01/30/20 1819 01/31/20 0512  WBC 10.6* 9.7 8.6 10.0    Liver Function Tests: Recent Labs  Lab 01/29/20 0754   AST 21  ALT 12  ALKPHOS 80  BILITOT 1.0  PROT 7.7  ALBUMIN 2.8*   No results for input(s): LIPASE, AMYLASE in the last 168 hours. No results for input(s): AMMONIA in the last 168 hours.  ABG    Component Value Date/Time   PHART 7.419 01/30/2020 1042   PCO2ART 26.5 (L) 01/30/2020 1042   PO2ART 143 (H) 01/30/2020 1042   HCO3 17.3 (L) 01/30/2020 1042   TCO2 18 (L) 01/30/2020 1042   ACIDBASEDEF 7.0 (H) 01/30/2020 1042   O2SAT 99.0 01/30/2020 1042     Coagulation Profile: Recent Labs  Lab 01/29/20 0754 01/30/20 1021  INR 1.2 1.5*    Cardiac Enzymes: No results for input(s): CKTOTAL, CKMB, CKMBINDEX, TROPONINI in the last 168 hours.  HbA1C: Hgb A1c MFr Bld  Date/Time Value Ref Range Status  01/29/2020 04:22 PM 6.5 (H) 4.8 - 5.6 % Final    Comment:    (NOTE) Pre diabetes:          5.7%-6.4%  Diabetes:              >6.4%  Glycemic control for   <7.0% adults with diabetes     CBG: Recent Labs  Lab 01/31/20 1622 01/31/20 1931 01/31/20 2326 02/01/20 0317 02/01/20 0746  GLUCAP 99 148* 142* 117* 119*    Review of Systems:   Unable to complete as patient is altered on mechanical ventilation.   Past Medical History  She,  has a past medical history of Arthritis, Depression, Diabetes mellitus without complication ([place]), Eating disorder, Hyperlipidemia, Hypertension, and Migraines.   Surgical History    Past Surgical History:  Procedure Laterality Date  . BREAST BIOPSY    . BREAST LUMPECTOMY Right    Years ago / Pt's daughter thinks it was cancer  . RADIOLOGY WITH ANESTHESIA N/A 01/29/2020   Procedure: IR WITH ANESTHESIA - CODE STROKE;  Surgeon: Radiologist, Medication, MD;  Location: [place];  Service: Radiology;  Laterality: N/A;     Social History   reports that she has never smoked. She has never used smokeless tobacco. She reports that she does not drink alcohol and does not use drugs.   Family History   Her family history includes Breast cancer (age  of onset: 48) in her sister.   Allergies Allergies  Allergen Reactions  . Sulfa Antibiotics Swelling and Rash     Home Medications  Prior to Admission medications   Medication Sig Start Date End Date Taking? Authorizing Provider  amoxicillin-clavulanate (AUGMENTIN) 875-125 MG tablet Take 1 tablet by mouth 2 (two) times daily. 01/26/20   Billie Ruddy, MD  anastrozole (ARIMIDEX) 1 MG tablet Take 1 mg by mouth daily.    [provider]  BIKTARVY 50-200-25 MG TABS tablet TAKE 1 TABLET BY MOUTH DAILY 01/27/20   Golden Circle, FNP  hydrochlorothiazide (HYDRODIURIL) 25 MG tablet TAKE 1 TABLET(25 MG) BY MOUTH DAILY 01/26/20   Billie Ruddy, MD  MELATONIN PO Take 6 mg by mouth at bedtime.    [provider]  metoprolol succinate (TOPROL-XL) 25 MG 24 hr tablet Take 1 tablet (25 mg total) by mouth daily. 12/31/19   Billie Ruddy, MD  potassium chloride SA (KLOR-CON) 20 MEQ tablet TAKE 1 TABLET(20 MEQ) BY MOUTH DAILY 01/26/20   Billie Ruddy, MD    Lars Masson, MD 02/01/2020, 8:20 AM

## 2020-02-01 NOTE — Progress Notes (Signed)
Foley cath removed at this time without complication. Purewick placed. Will monitor for void.

## 2020-02-01 NOTE — Progress Notes (Signed)
STROKE TEAM PROGRESS NOTE   INTERVAL HISTORY Her son is  at the bedside  .  She remains on ventilatory support for respiratory failure.  Though she appears to be weaning his on minimum support.  She had a chest tube inserted yesterday which is draining bloody fluid.  Cytology report is pending pt still has right gaze, not following commands and left hemiplegia. .  Vital signs are stable.  Vitals:   02/01/20 0200 02/01/20 0300 02/01/20 0400 02/01/20 0500  BP: (!) 153/62 (!) 160/69 (!) 172/82 (!) 150/57  Pulse:   (!) 106   Resp:      Temp: 97.9 F (36.6 C) 97.9 F (36.6 C) 98.4 F (36.9 C) 98.6 F (37 C)  TempSrc:      SpO2:   100%   Weight:      Height:       CBC:  Recent Labs  Lab 01/29/20 0754 01/29/20 0759 01/30/20 0500 01/30/20 1021 01/30/20 1819 01/31/20 0512  WBC 9.1   < > 10.6*   < > 8.6 10.0  NEUTROABS 7.1  --  8.7*  --   --   --   HGB 13.2   < > 8.2*   < > 10.2* 10.8*  HCT 42.3   < > 24.8*   < > 31.3* 33.0*  MCV 94.6   < > 92.5   < > 89.7 90.2  PLT 271   < > 261   < > 160 184   < > = values in this interval not displayed.   Basic Metabolic Panel:  Recent Labs  Lab 01/30/20 0500 01/30/20 0500 01/30/20 1042 01/31/20 0512  NA 141   < > 145 141  K 3.1*   < > 3.0* 3.8  CL 112*  --   --  117*  CO2 17*  --   --  14*  GLUCOSE 140*  --   --  121*  BUN 27*  --   --  21  CREATININE 1.55*  --   --  1.47*  CALCIUM 7.4*  --   --  7.3*   < > = values in this interval not displayed.   Lipid Panel:  Recent Labs  Lab 01/30/20 0500 01/30/20 0500 01/31/20 0512  CHOL 119  --   --   TRIG 207*  206*   < > 224*  HDL 16*  --   --   CHOLHDL 7.4  --   --   VLDL 41*  --   --   LDLCALC 62  --   --    < > = values in this interval not displayed.   HgbA1c:  Recent Labs  Lab 01/29/20 1622  HGBA1C 6.5*   Urine Drug Screen:  Recent Labs  Lab 01/30/20 1118  LABOPIA NONE DETECTED  COCAINSCRNUR NONE DETECTED  LABBENZ NONE DETECTED  AMPHETMU NONE DETECTED  THCU  NONE DETECTED  LABBARB NONE DETECTED    Alcohol Level No results for input(s): ETH in the last 168 hours.  IMAGING past 24 hours DG Chest 1 View  Result Date: 01/31/2020 CLINICAL DATA:  Chest tube in place. EXAM: CHEST  1 VIEW COMPARISON:  Radiographs 01/30/2020 and 01/29/2020.  CT 01/30/2020. FINDINGS: 1158 hours. Endotracheal and enteric tubes are in stable position. Interval placement of a left chest tube, in good position. Near complete evacuation of the previously demonstrated large left pleural effusion. There is improved aeration of the left lung with a residual left apical mass lesion  suspicious for neoplasm. Mild atelectasis remains at the left lung base. The right lung is clear. There is no pneumothorax. The heart size and mediastinal contours are stable with aortic atherosclerosis. Surgical clips are present in the right axilla. IMPRESSION: Interval left chest tube placement with near complete evacuation of large left pleural effusion and improved aeration of the left lung. Persistent left apical mass suspicious for neoplasm. Electronically Signed   By: Richardean Sale M.D.   On: 01/31/2020 13:02   CT HEAD WO CONTRAST  Result Date: 01/31/2020 CLINICAL DATA:  Change in mental status. Status post revascularization of the right MCA. EXAM: CT HEAD WITHOUT CONTRAST TECHNIQUE: Contiguous axial images were obtained from the base of the skull through the vertex without intravenous contrast. COMPARISON:  CT head and CTA head 01/29/2020. MR head without contrast 01/30/2000. FINDINGS: Brain: Evolving right MCA nonhemorrhagic infarct involving the superior temporal lobe and right parietal lobe as well as the basal ganglia is again seen. Expected changes are noted. Infarct involves the posterior insular cortex is well. Focal subarachnoid hemorrhage near the right MCA bifurcation is stable. Mass effect is present with some effacement of the right lateral ventricle. No midline shift is present. Atrophy and  white matter disease on the left is stable. Insert normal brainstem Vascular: Atherosclerotic calcifications are present within the cavernous internal carotid arteries bilaterally. No distal hyperdense vessel is present. Skull: Calvarium is intact. No focal lytic or blastic lesions are present. No significant extracranial soft tissue lesion is present. Sinuses/Orbits: The paranasal sinuses and mastoid air cells are clear. The globes and orbits are within normal limits. IMPRESSION: 1. Expected evolution of right MCA nonhemorrhagic infarct. 2. Stable focal subarachnoid hemorrhage near the right MCA bifurcation. 3. Stable atrophy and white matter disease on the left. Electronically Signed   By: San Morelle M.D.   On: 01/31/2020 16:07    PHYSICAL EXAM  Temp:  [97.2 F (36.2 C)-99 F (37.2 C)] 98.6 F (37 C) (10/10 0500) Pulse Rate:  [71-108] 106 (10/10 0400) Resp:  [21] 21 (10/09 1441) BP: (118-230)/(50-198) 150/57 (10/10 0500) SpO2:  [91 %-100 %] 100 % (10/10 0400) Arterial Line BP: (141-179)/(57-73) 179/73 (10/09 1000) FiO2 (%):  [40 %] 40 % (10/10 0159)  General - Well nourished, well developed elderly African-American lady, intubated on sedation.  Ophthalmologic - fundi not visualized due to noncooperation.  Cardiovascular - Regular rate and rhythm.  Neuro - intubated on propofol, eyes open, not following commands.  Globally aphasic.  Eye in right gaze position, not cross midline, not blinking to visual threat bilaterally, doll's eyes sluggish, not tracking on the left visual field but seems able to track on the right, PERRL. Corneal reflex present, gag and cough present. Breathing over the vent.  Facial symmetry not able to test due to ET tube.  Tongue protrusion not cooperative. Spontaneous movement of RUE and RLE and localize to pain, RUE 3/5 at least and RLE 3-/5 at least. LUE and LLE not spontaneous movement, but mild withdraw to pain. DTR 1+ and no babinski. Sensation,  coordination and gait not tested.   ASSESSMENT/PLAN Amber Lopez is a 79 y.o. female with history of depression, HLD, HIV, HTN and DM presenting with left hemiplegia, left facial droop, right eye deviation and aphasia. Received IV tPA 01/29/2020 at 0816. -> IR 01/29/20  Stroke:  R MCA and small L cerebellar infarcts s/p tPA + IR R M1 occlusion w/ TICI2c revascularization, embolic secondary to unknown source possibly hypercoagulability from lung  cancer  Code Stroke CT head No acute abnormality. Atrophy. ASPECTS 10.     CTA head & neck LVO R M1. Atherosclerosis. L superior sulcus mass w/ large pleural effusion malignant appearing w/ associated mediastinal adenopathy.  Cerebral angio / IR - R M1 occlusion s/p TICI2c revascularization using Tiger x 1, embotrap x 1, solitaire x 2.  Post IR CT - no ICH, contrast stain R basal ganglia    MRI  R MCA multifocal infarct w/ mild petechial hemorrhage. Small L cerebellar infarct. Atrophy,  CT Head repeat 01/31/20 - Expected evolution of right MCA nonhemorrhagic infarct. Stable focal subarachnoid hemorrhage near the right MCA bifurcation. Stable atrophy and white matter disease on the left.  2D Echo EF 55 to 60%, large pleural effusion on the left  LDL 62  HgbA1c 6.5  VTE prophylaxis - SCDs-> Heparin 5000 units sq Q12  No antithrombotic prior to admission, ASA 81 mg daily started 01/31/20  Therapy recommendations:  pending   Disposition:  pending   Acute Respiratory Failure  Secondary to stroke  Intubated for IR, remains intubated  Sedated  CXR w/ persistent large L pleural effusion w/ atx and consolidation  CCM on board   Severe anemia   Acute blood loss anemia s/p IR, Hb 13.2->9.2->8.2->6.5 . . . 10.8->pending  PRBC transfusion  CT abd/pelvis no retroperitoneal hemorrhage  Right groin oozing from sheath site - compressed - no active bleeding now  CBC monitoring  CCM on board  Large L lung mass  CTA neck - L  superior sulcus mass w/ large pleural effusion malignant appearing w/ associated mediastinal adenopathy.  CT chest large left pleural effusion with near complete left lung collapse.  Left apical mass suspicious for bronchogenic carcinoma  CT abd/pelvis no distal metastasis disease  CCM is considering thoracentesis in a.m. once stable  Lt chest tube placed 01/31/20 - Dr Thayer Jew  Hypotension/Hypertension  Home meds:  hctz 25, metoprolol 25  On phenylephepdirine  . BP goal normotensive  Diabetes type II Controlled  Home meds:  None listed  HgbA1c 6.5, goal < 7.0  CBGs  SSI  Dysphagia . Secondary to stroke . NPO . No oral access    HIV  HARRP on Biktarvy.   CD4 in 07/2019 242  Current CD4 pending ?  Other Stroke Risk Factors  Advanced age  Migraines  Other Active Problems  Depression / anxiety  Hx right breast cancer on arimidex  Post IR groin bleeding. Sheath removed using quick clot and pressure held. Stable CKD - stage 4  - creatinine - 1.47   Hospital day # 3 I had a long discussion with the patient's son at the bedside regarding her suspected diagnosis of lung cancer and poor prognosis.  I recommend family meeting with patient's son and daughter soon to decide on goals of care.  Son wants to wait till the final definitive diagnosis of cancer before making decisions about goals of care.  Continue ventilatory support for respiratory failure and chest tube for drainage of pleural effusion per critical care medicine.  Discussed with Dr. Marolyn Hammock critical care medicine This patient is critically ill due to severe anemia needing blood transfusion, right MCA occlusion status post TPA and thrombectomy, right MCA stroke, respirate failure need intubation, hypotension on pressor, left lung mass and at significant risk of neurological worsening, death form sepsis, hypovolemic shock, recurrent stroke, hemorrhagic conversion, seizure. This patient's care requires constant  monitoring of vital signs, hemodynamics, respiratory and cardiac monitoring, review of  multiple databases, neurological assessment, discussion with family, other specialists and medical decision making of high complexity. I spent 35 minutes of neurocritical care time in the care of this patient. I had long discussion with   son at the bedside, updated pt current condition, treatment plan and potential prognosis, and answered all the questions.  They expressed understanding and appreciation.  I discussed with Dr. Pearline Cables CCM service.  Amber Contras, MD  To contact Stroke Continuity provider, please refer to [email or web address] After hours, contact General Neurology

## 2020-02-01 NOTE — Evaluation (Signed)
Physical Therapy Evaluation Patient Details Name: Amber Lopez MRN: [id number] DOB: [birth year] Today's Date: 02/01/2020   History of Present Illness  79 y.o. female with a PMHx of depression, HLD, HIV, HTN and DM presenting with acute onset of left hemiplegia, left facial droop, right eye deviation and aphasia. Pt received IV tPA and underwent R MCA revacularization on 01/29/2020. Pt also found to have large left pleural effusion with near complete L lung collapse and L apical mass. Chest tube placed on 01/31/2020.  Clinical Impression  Pt presents to PT with deficits in functional mobility, gait, balance, level of arousal, strength, power, balance, tone. Pt is flaccid on L side and demonstrate only spontaneous movement of RUE. Pt does not follow commands and only opens eyes briefly after PT washes the pt's face. Pt will benefit from further assessment of mobility and command following at next session as RN did report the pt received fentanyl this morning, possibly contributing to level of arousal. PT recommends SNF placement at this time post-discharge.    Follow Up Recommendations SNF    Equipment Recommendations  Wheelchair (measurements PT);Wheelchair cushion (measurements PT);Hospital bed (mechanical lift, all if D/C home)    Recommendations for Other Services       Precautions / Restrictions Precautions Precautions: Fall Precaution Comments: chest tube, ETT Restrictions Weight Bearing Restrictions: No      Mobility  Bed Mobility Overal bed mobility: Needs Assistance             General bed mobility comments: PT assists pt into bed egress position with totalA of bed. Further mobility deferred at this time due to poor participation and arousal at bed level  Transfers                    Ambulation/Gait                Stairs            Wheelchair Mobility    Modified Rankin (Stroke Patients Only) Modified Rankin (Stroke Patients Only) Pre-Morbid  Rankin Score: Moderate disability Modified Rankin: Severe disability     Balance Overall balance assessment:  (requires further assessment)                                           Pertinent Vitals/Pain Pain Assessment: Faces Faces Pain Scale: Hurts little more Pain Location: generalized with PROM Pain Descriptors / Indicators: Grimacing Pain Intervention(s): Monitored during session    Home Living Family/patient expects to be discharged to:: Private residence Living Arrangements: Children Available Help at Discharge: Family;Available 24 hours/day;Home health Type of Home: House Home Access: Level entry     Home Layout: One level Home Equipment: Toilet riser;Cane - single point;Shower seat;Grab bars - tub/shower Additional Comments: history obtained pt's son    Prior Function Level of Independence: Needs assistance   Gait / Transfers Assistance Needed: pt requires intermittent assistance to mobilize to bathroom per son, does not use an assistive device  ADL's / Homemaking Assistance Needed: pt requires assistance for IADLs, meal prep, medicine management        Hand Dominance        Extremity/Trunk Assessment   Upper Extremity Assessment Upper Extremity Assessment: RUE deficits/detail;LUE deficits/detail RUE Deficits / Details: pt spontaneuosly moved R hand and arm briefly during session once transitioned into bed egress position. Pt does not follow any commands  with RUE. Pt does appear to resist passive shoulder flexion and elbow motion from PT limiting ROM assessment LUE Deficits / Details: LUE is flaccid, PROM WFL    Lower Extremity Assessment Lower Extremity Assessment: RLE deficits/detail;LLE deficits/detail RLE Deficits / Details: no AROM noted, PROM WFL, increased extensor tone vs pt resistance to PROM noted LLE Deficits / Details: no AROM noted, PROM WFL, flaccid    Cervical / Trunk Assessment Cervical / Trunk Assessment: Normal   Communication   Communication:  (intubated)  Cognition Arousal/Alertness: Lethargic;Suspect due to medications (RN reports receiving some fentanyl this morning) Behavior During Therapy: Flat affect Overall Cognitive Status: Difficult to assess                                 General Comments: pt does not follow any commands during session, spontaneuosly opens eyes after PT washes her face and spontaneuosly moves R arm and hand, otherwise no AROM noted      General Comments General comments (skin integrity, edema, etc.): pt noted to have R gaze preference, does not maintain eyes open long enough or follow commands well enough to assess vision further. Pt RR does increase into low 40s on Vent with PT cleaning face and eyes with cold rag, settling back to 20s with rest. Pt intubated on 40% FiO2 5 PEEP.    Exercises     Assessment/Plan    PT Assessment Patient needs continued PT services  PT Problem List Decreased strength;Decreased activity tolerance;Decreased balance;Decreased mobility;Decreased cognition;Decreased knowledge of use of DME;Decreased safety awareness;Decreased knowledge of precautions;Cardiopulmonary status limiting activity;Impaired tone       PT Treatment Interventions DME instruction;Functional mobility training;Therapeutic activities;Therapeutic exercise;Balance training;Neuromuscular re-education;Cognitive remediation;Patient/family education;Wheelchair mobility training    PT Goals (Current goals can be found in the Care Plan section)  Acute Rehab PT Goals Patient Stated Goal: To improve mobility quality and level of arousal PT Goal Formulation: With patient/family Time For Goal Achievement: 02/15/20 Potential to Achieve Goals: Fair Additional Goals Additional Goal #1: Pt will follow at least 50% of PT motor commands to improve participation in PT treatment    Frequency Min 3X/week   Barriers to discharge        Co-evaluation                AM-PAC PT "6 Clicks" Mobility  Outcome Measure Help needed turning from your back to your side while in a flat bed without using bedrails?: Total Help needed moving from lying on your back to sitting on the side of a flat bed without using bedrails?: Total Help needed moving to and from a bed to a chair (including a wheelchair)?: Total Help needed standing up from a chair using your arms (e.g., wheelchair or bedside chair)?: Total Help needed to walk in hospital room?: Total Help needed climbing 3-5 steps with a railing? : Total 6 Click Score: 6    End of Session Equipment Utilized During Treatment: Oxygen Activity Tolerance: Patient limited by lethargy Patient left: in bed;with call bell/phone within reach;with bed alarm set;with restraints reapplied (mits) Nurse Communication: Mobility status;Need for lift equipment PT Visit Diagnosis: Other abnormalities of gait and mobility (R26.89);Muscle weakness (generalized) (M62.81);Hemiplegia and hemiparesis;Other symptoms and signs involving the nervous system (R29.898) Hemiplegia - Right/Left: Left Hemiplegia - dominant/non-dominant: Non-dominant Hemiplegia - caused by: Cerebral infarction    Time: 1012-1033 PT Time Calculation (min) (ACUTE ONLY): 21 min   Charges:   PT  Evaluation $PT Eval Moderate Complexity: 1 Mod          Zenaida Niece, PT, DPT Acute Rehabilitation Pager: [phone]   Zenaida Niece 02/01/2020, 11:13 AM

## 2020-02-02 ENCOUNTER — Inpatient Hospital Stay (HOSPITAL_COMMUNITY): Payer: Medicare Other

## 2020-02-02 ENCOUNTER — Other Ambulatory Visit: Payer: Medicare Other

## 2020-02-02 ENCOUNTER — Inpatient Hospital Stay: Payer: Self-pay

## 2020-02-02 DIAGNOSIS — I63511 Cerebral infarction due to unspecified occlusion or stenosis of right middle cerebral artery: Secondary | ICD-10-CM | POA: Diagnosis not present

## 2020-02-02 DIAGNOSIS — R569 Unspecified convulsions: Secondary | ICD-10-CM

## 2020-02-02 DIAGNOSIS — I6601 Occlusion and stenosis of right middle cerebral artery: Secondary | ICD-10-CM | POA: Diagnosis not present

## 2020-02-02 LAB — URINALYSIS, ROUTINE W REFLEX MICROSCOPIC
Bilirubin Urine: NEGATIVE
Glucose, UA: 50 mg/dL — AB
Ketones, ur: NEGATIVE mg/dL
Leukocytes,Ua: NEGATIVE
Nitrite: NEGATIVE
Protein, ur: NEGATIVE mg/dL
Specific Gravity, Urine: 1.018 (ref 1.005–1.030)
pH: 5 (ref 5.0–8.0)

## 2020-02-02 LAB — CBC
HCT: 28.8 % — ABNORMAL LOW (ref 36.0–46.0)
Hemoglobin: 9.6 g/dL — ABNORMAL LOW (ref 12.0–15.0)
MCH: 29.3 pg (ref 26.0–34.0)
MCHC: 33.3 g/dL (ref 30.0–36.0)
MCV: 87.8 fL (ref 80.0–100.0)
Platelets: 214 10*3/uL (ref 150–400)
RBC: 3.28 MIL/uL — ABNORMAL LOW (ref 3.87–5.11)
RDW: 15.1 % (ref 11.5–15.5)
WBC: 8.1 10*3/uL (ref 4.0–10.5)
nRBC: 0.9 % — ABNORMAL HIGH (ref 0.0–0.2)

## 2020-02-02 LAB — BASIC METABOLIC PANEL
Anion gap: 7 (ref 5–15)
BUN: 15 mg/dL (ref 8–23)
CO2: 17 mmol/L — ABNORMAL LOW (ref 22–32)
Calcium: 7.6 mg/dL — ABNORMAL LOW (ref 8.9–10.3)
Chloride: 115 mmol/L — ABNORMAL HIGH (ref 98–111)
Creatinine, Ser: 0.99 mg/dL (ref 0.44–1.00)
GFR, Estimated: 54 mL/min — ABNORMAL LOW (ref >60)
Glucose, Bld: 227 mg/dL — ABNORMAL HIGH (ref 70–99)
Potassium: 3.6 mmol/L (ref 3.5–5.1)
Sodium: 139 mmol/L (ref 135–145)

## 2020-02-02 LAB — CYTOLOGY - NON PAP

## 2020-02-02 LAB — GLUCOSE, CAPILLARY
Glucose-Capillary: 200 mg/dL — ABNORMAL HIGH (ref 70–99)
Glucose-Capillary: 205 mg/dL — ABNORMAL HIGH (ref 70–99)
Glucose-Capillary: 183 mg/dL — ABNORMAL HIGH (ref 70–99)
Glucose-Capillary: 189 mg/dL — ABNORMAL HIGH (ref 70–99)
Glucose-Capillary: 157 mg/dL — ABNORMAL HIGH (ref 70–99)
Glucose-Capillary: 155 mg/dL — ABNORMAL HIGH (ref 70–99)

## 2020-02-02 LAB — TRIGLYCERIDES: Triglycerides: 129 mg/dL (ref <150)

## 2020-02-02 LAB — LACTIC ACID, PLASMA: Lactic Acid, Venous: 1.6 mmol/L (ref 0.5–1.9)

## 2020-02-02 MED ORDER — SODIUM CHLORIDE 0.9 % IV SOLN: INTRAVENOUS | Status: DC | PRN

## 2020-02-02 MED ORDER — MIDAZOLAM HCL 2 MG/2ML IJ SOLN
INTRAMUSCULAR | Status: AC
  Administered 2020-02-02: 2 mg via INTRAVENOUS
  Filled 2020-02-02: qty 2

## 2020-02-02 MED ORDER — SODIUM CHLORIDE 0.9% FLUSH
10.0000 mL | Freq: Two times a day (BID) | INTRAVENOUS | Status: DC
  Administered 2020-02-02 – 2020-02-08 (×12): 10 mL
  Administered 2020-02-08: 20 mL
  Administered 2020-02-09: 10 mL
  Administered 2020-02-09: 20 mL
  Administered 2020-02-10 – 2020-02-16 (×14): 10 mL
  Administered 2020-02-17: 20 mL
  Administered 2020-02-17 – 2020-02-19 (×4): 10 mL
  Administered 2020-02-19: 20 mL

## 2020-02-02 MED ORDER — OSMOLITE 1.2 CAL PO LIQD
1000.0000 mL | ORAL | Status: DC
  Administered 2020-02-02 – 2020-02-05 (×3): 1000 mL
  Filled 2020-02-02 (×4): qty 1000

## 2020-02-02 MED ORDER — SODIUM CHLORIDE 0.9% FLUSH: 10.0000 mL | INTRAVENOUS | Status: DC | PRN

## 2020-02-02 MED ORDER — FENTANYL CITRATE (PF) 100 MCG/2ML IJ SOLN
50.0000 ug | Freq: Once | INTRAMUSCULAR | Status: AC
  Administered 2020-02-02: 50 ug via INTRAVENOUS

## 2020-02-02 MED ORDER — INSULIN ASPART 100 UNIT/ML ~~LOC~~ SOLN
0.0000 [IU] | SUBCUTANEOUS | Status: DC
  Administered 2020-02-02 – 2020-02-03 (×3): 3 [IU] via SUBCUTANEOUS
  Administered 2020-02-03: 5 [IU] via SUBCUTANEOUS
  Administered 2020-02-03 (×2): 3 [IU] via SUBCUTANEOUS
  Administered 2020-02-03 (×2): 5 [IU] via SUBCUTANEOUS
  Administered 2020-02-04 – 2020-02-05 (×7): 3 [IU] via SUBCUTANEOUS
  Administered 2020-02-05: 5 [IU] via SUBCUTANEOUS
  Administered 2020-02-05 (×2): 3 [IU] via SUBCUTANEOUS
  Administered 2020-02-05: 5 [IU] via SUBCUTANEOUS
  Administered 2020-02-05 – 2020-02-06 (×2): 3 [IU] via SUBCUTANEOUS
  Administered 2020-02-06: 2 [IU] via SUBCUTANEOUS
  Administered 2020-02-06: 3 [IU] via SUBCUTANEOUS
  Administered 2020-02-06 – 2020-02-07 (×3): 2 [IU] via SUBCUTANEOUS
  Administered 2020-02-07 (×3): 3 [IU] via SUBCUTANEOUS
  Administered 2020-02-07: 2 [IU] via SUBCUTANEOUS
  Administered 2020-02-08: 3 [IU] via SUBCUTANEOUS
  Administered 2020-02-08: 2 [IU] via SUBCUTANEOUS
  Administered 2020-02-08: 3 [IU] via SUBCUTANEOUS
  Administered 2020-02-08 (×3): 2 [IU] via SUBCUTANEOUS
  Administered 2020-02-09 (×5): 3 [IU] via SUBCUTANEOUS
  Administered 2020-02-10: 2 [IU] via SUBCUTANEOUS
  Administered 2020-02-10: 3 [IU] via SUBCUTANEOUS
  Administered 2020-02-10 – 2020-02-11 (×5): 2 [IU] via SUBCUTANEOUS
  Administered 2020-02-11: 3 [IU] via SUBCUTANEOUS
  Administered 2020-02-12: 2 [IU] via SUBCUTANEOUS
  Administered 2020-02-12 (×2): 3 [IU] via SUBCUTANEOUS
  Administered 2020-02-13: 2 [IU] via SUBCUTANEOUS
  Administered 2020-02-13 (×4): 3 [IU] via SUBCUTANEOUS
  Administered 2020-02-14: 2 [IU] via SUBCUTANEOUS
  Administered 2020-02-14: 3 [IU] via SUBCUTANEOUS
  Administered 2020-02-14: 5 [IU] via SUBCUTANEOUS
  Administered 2020-02-14: 3 [IU] via SUBCUTANEOUS
  Administered 2020-02-14: 2 [IU] via SUBCUTANEOUS
  Administered 2020-02-14 – 2020-02-15 (×2): 3 [IU] via SUBCUTANEOUS
  Administered 2020-02-15: 2 [IU] via SUBCUTANEOUS
  Administered 2020-02-15: 3 [IU] via SUBCUTANEOUS
  Administered 2020-02-15: 5 [IU] via SUBCUTANEOUS
  Administered 2020-02-15 (×2): 2 [IU] via SUBCUTANEOUS
  Administered 2020-02-16 (×2): 3 [IU] via SUBCUTANEOUS
  Administered 2020-02-16: 2 [IU] via SUBCUTANEOUS
  Administered 2020-02-17: 3 [IU] via SUBCUTANEOUS
  Administered 2020-02-17: 2 [IU] via SUBCUTANEOUS
  Administered 2020-02-17: 3 [IU] via SUBCUTANEOUS
  Administered 2020-02-17: 2 [IU] via SUBCUTANEOUS
  Administered 2020-02-17 – 2020-02-18 (×3): 3 [IU] via SUBCUTANEOUS
  Administered 2020-02-18: 2 [IU] via SUBCUTANEOUS
  Administered 2020-02-18: 5 [IU] via SUBCUTANEOUS
  Administered 2020-02-18: 2 [IU] via SUBCUTANEOUS
  Administered 2020-02-18: 5 [IU] via SUBCUTANEOUS
  Administered 2020-02-18: 3 [IU] via SUBCUTANEOUS
  Administered 2020-02-19 (×4): 2 [IU] via SUBCUTANEOUS
  Administered 2020-02-19 – 2020-02-20 (×2): 3 [IU] via SUBCUTANEOUS
  Administered 2020-02-21: 5 [IU] via SUBCUTANEOUS
  Administered 2020-02-21 (×4): 2 [IU] via SUBCUTANEOUS
  Administered 2020-02-21 – 2020-02-22 (×3): 3 [IU] via SUBCUTANEOUS
  Administered 2020-02-22: 2 [IU] via SUBCUTANEOUS
  Administered 2020-02-22 – 2020-02-23 (×5): 3 [IU] via SUBCUTANEOUS
  Administered 2020-02-24: 2 [IU] via SUBCUTANEOUS
  Administered 2020-02-24 – 2020-02-25 (×4): 3 [IU] via SUBCUTANEOUS
  Administered 2020-02-25: 2 [IU] via SUBCUTANEOUS
  Administered 2020-02-25: 3 [IU] via SUBCUTANEOUS
  Administered 2020-02-25 – 2020-02-26 (×4): 2 [IU] via SUBCUTANEOUS
  Administered 2020-02-27 – 2020-02-28 (×3): 3 [IU] via SUBCUTANEOUS
  Administered 2020-02-29 (×4): 2 [IU] via SUBCUTANEOUS
  Administered 2020-02-29: 3 [IU] via SUBCUTANEOUS
  Administered 2020-03-01 (×2): 2 [IU] via SUBCUTANEOUS
  Administered 2020-03-01: 5 [IU] via SUBCUTANEOUS
  Administered 2020-03-01: 3 [IU] via SUBCUTANEOUS
  Administered 2020-03-01 (×2): 2 [IU] via SUBCUTANEOUS
  Administered 2020-03-02: 3 [IU] via SUBCUTANEOUS
  Administered 2020-03-02 (×3): 2 [IU] via SUBCUTANEOUS
  Administered 2020-03-03: 3 [IU] via SUBCUTANEOUS
  Administered 2020-03-03: 2 [IU] via SUBCUTANEOUS
  Administered 2020-03-03 (×3): 5 [IU] via SUBCUTANEOUS
  Administered 2020-03-04: 8 [IU] via SUBCUTANEOUS
  Administered 2020-03-04: 11 [IU] via SUBCUTANEOUS
  Administered 2020-03-04: 3 [IU] via SUBCUTANEOUS
  Administered 2020-03-05 (×2): 5 [IU] via SUBCUTANEOUS
  Administered 2020-03-05: 8 [IU] via SUBCUTANEOUS
  Administered 2020-03-05: 5 [IU] via SUBCUTANEOUS
  Administered 2020-03-05: 3 [IU] via SUBCUTANEOUS
  Administered 2020-03-06 (×3): 5 [IU] via SUBCUTANEOUS
  Administered 2020-03-06: 3 [IU] via SUBCUTANEOUS
  Administered 2020-03-06: 8 [IU] via SUBCUTANEOUS
  Administered 2020-03-07: 3 [IU] via SUBCUTANEOUS
  Administered 2020-03-07 (×2): 5 [IU] via SUBCUTANEOUS
  Administered 2020-03-07: 8 [IU] via SUBCUTANEOUS
  Administered 2020-03-07 – 2020-03-08 (×2): 5 [IU] via SUBCUTANEOUS
  Administered 2020-03-08 (×2): 3 [IU] via SUBCUTANEOUS
  Administered 2020-03-08 – 2020-03-09 (×2): 5 [IU] via SUBCUTANEOUS
  Administered 2020-03-09 (×3): 3 [IU] via SUBCUTANEOUS
  Administered 2020-03-09 – 2020-03-10 (×2): 5 [IU] via SUBCUTANEOUS
  Administered 2020-03-10: 2 [IU] via SUBCUTANEOUS
  Administered 2020-03-10 (×2): 3 [IU] via SUBCUTANEOUS
  Administered 2020-03-11: 5 [IU] via SUBCUTANEOUS
  Administered 2020-03-11 (×4): 3 [IU] via SUBCUTANEOUS
  Administered 2020-03-11: 2 [IU] via SUBCUTANEOUS
  Administered 2020-03-12: 5 [IU] via SUBCUTANEOUS
  Administered 2020-03-12: 3 [IU] via SUBCUTANEOUS
  Administered 2020-03-12: 5 [IU] via SUBCUTANEOUS
  Administered 2020-03-12 (×2): 3 [IU] via SUBCUTANEOUS
  Administered 2020-03-12 – 2020-03-13 (×2): 2 [IU] via SUBCUTANEOUS
  Administered 2020-03-13 (×2): 5 [IU] via SUBCUTANEOUS
  Administered 2020-03-13: 8 [IU] via SUBCUTANEOUS
  Administered 2020-03-13: 5 [IU] via SUBCUTANEOUS
  Administered 2020-03-13: 3 [IU] via SUBCUTANEOUS
  Administered 2020-03-14 (×2): 5 [IU] via SUBCUTANEOUS
  Administered 2020-03-14: 3 [IU] via SUBCUTANEOUS
  Administered 2020-03-14: 2 [IU] via SUBCUTANEOUS
  Administered 2020-03-14 – 2020-03-15 (×3): 3 [IU] via SUBCUTANEOUS
  Administered 2020-03-15 (×2): 5 [IU] via SUBCUTANEOUS
  Administered 2020-03-15: 3 [IU] via SUBCUTANEOUS
  Administered 2020-03-15: 5 [IU] via SUBCUTANEOUS
  Administered 2020-03-15: 8 [IU] via SUBCUTANEOUS
  Administered 2020-03-15: 3 [IU] via SUBCUTANEOUS
  Administered 2020-03-16 (×2): 5 [IU] via SUBCUTANEOUS
  Administered 2020-03-16: 3 [IU] via SUBCUTANEOUS
  Administered 2020-03-16: 5 [IU] via SUBCUTANEOUS
  Administered 2020-03-16 – 2020-03-17 (×2): 3 [IU] via SUBCUTANEOUS
  Administered 2020-03-17: 2 [IU] via SUBCUTANEOUS
  Administered 2020-03-17 (×2): 3 [IU] via SUBCUTANEOUS
  Administered 2020-03-17: 5 [IU] via SUBCUTANEOUS
  Administered 2020-03-17: 3 [IU] via SUBCUTANEOUS
  Administered 2020-03-18 (×2): 2 [IU] via SUBCUTANEOUS
  Administered 2020-03-18: 5 [IU] via SUBCUTANEOUS
  Administered 2020-03-18: 11 [IU] via SUBCUTANEOUS
  Administered 2020-03-19: 8 [IU] via SUBCUTANEOUS
  Administered 2020-03-19 – 2020-03-20 (×5): 5 [IU] via SUBCUTANEOUS
  Administered 2020-03-20 (×2): 3 [IU] via SUBCUTANEOUS
  Administered 2020-03-20: 8 [IU] via SUBCUTANEOUS
  Administered 2020-03-21: 5 [IU] via SUBCUTANEOUS
  Administered 2020-03-21 (×2): 8 [IU] via SUBCUTANEOUS
  Administered 2020-03-21: 3 [IU] via SUBCUTANEOUS
  Administered 2020-03-21: 8 [IU] via SUBCUTANEOUS
  Administered 2020-03-22: 5 [IU] via SUBCUTANEOUS
  Administered 2020-03-22: 11 [IU] via SUBCUTANEOUS
  Administered 2020-03-22: 3 [IU] via SUBCUTANEOUS
  Administered 2020-03-22: 8 [IU] via SUBCUTANEOUS

## 2020-02-02 MED ORDER — INSULIN ASPART 100 UNIT/ML ~~LOC~~ SOLN: 2.0000 [IU] | SUBCUTANEOUS | Status: DC

## 2020-02-02 MED ORDER — MIDAZOLAM HCL 2 MG/2ML IJ SOLN: 2.0000 mg | Freq: Once | INTRAMUSCULAR | Status: AC

## 2020-02-02 MED ORDER — PROSOURCE TF PO LIQD
45.0000 mL | Freq: Three times a day (TID) | ORAL | Status: DC
  Administered 2020-02-02 – 2020-02-05 (×10): 45 mL
  Filled 2020-02-02 (×10): qty 45

## 2020-02-02 NOTE — Progress Notes (Signed)
SLP Cancellation Note  Patient Details Name: Amber Lopez MRN: [id number] DOB: 01/06/1941   Cancelled treatment:       Reason Eval/Treat Not Completed: Medical issues which prohibited therapy (remains on vent this am). Will f/u as able.   Osie Bond., M.A. [facility] Pager [phone] Office [phone]  02/02/2020, 8:29 AM

## 2020-02-02 NOTE — Progress Notes (Signed)
STROKE TEAM PROGRESS NOTE   INTERVAL HISTORY Patient had some neurological worsening this a.m. with possible seizure with am with forced R gaze more prominent w/ HTN but without shaking or jerking. Improved following versed and fentanyl, resumed propofol. Less arousable now.  Vital signs are stable except for tachycardia.  Follow-up CT scan and EEG are pending patient remains intubated for respiratory failure.  Pleural fluid cytology results are yet pending  Vitals:   02/02/20 0700 02/02/20 0815 02/02/20 0900 02/02/20 0905  BP: (!) 145/61  (!) 175/110 132/61  Pulse: 92 (!) 126 (!) 148 (!) 148  Resp:  (!) 41    Temp: 99.5 F (37.5 C)  (!) 100.8 F (38.2 C) (!) 100.8 F (38.2 C)  TempSrc:      SpO2: 99% 100% 100% 99%  Weight:      Height:       CBC:  Recent Labs  Lab 01/29/20 0754 01/29/20 0759 01/30/20 0500 01/30/20 1021 01/31/20 0512 02/01/20 1036  WBC 9.1   < > 10.6*   < > 10.0 14.1*  NEUTROABS 7.1  --  8.7*  --   --   --   HGB 13.2   < > 8.2*   < > 10.8* 11.3*  HCT 42.3   < > 24.8*   < > 33.0* 35.5*  MCV 94.6   < > 92.5   < > 90.2 89.9  PLT 271   < > 261   < > 184 211   < > = values in this interval not displayed.   Basic Metabolic Panel:  Recent Labs  Lab 01/31/20 0512 02/01/20 1036  NA 141 143  K 3.8 4.8  CL 117* 116*  CO2 14* 15*  GLUCOSE 121* 143*  BUN 21 15  CREATININE 1.47* 1.31*  CALCIUM 7.3* 8.0*   Lipid Panel:  Recent Labs  Lab 01/30/20 0500 01/31/20 0512 02/01/20 1036  CHOL 119  --   --   TRIG 207*  206*   < > 152*  HDL 16*  --   --   CHOLHDL 7.4  --   --   VLDL 41*  --   --   LDLCALC 62  --   --    < > = values in this interval not displayed.   HgbA1c:  Recent Labs  Lab 01/29/20 1622  HGBA1C 6.5*   Urine Drug Screen:  Recent Labs  Lab 01/30/20 1118  LABOPIA NONE DETECTED  COCAINSCRNUR NONE DETECTED  LABBENZ NONE DETECTED  AMPHETMU NONE DETECTED  THCU NONE DETECTED  LABBARB NONE DETECTED    Alcohol Level No results for  input(s): ETH in the last 168 hours.  IMAGING past 24 hours No results found.  PHYSICAL EXAM    Temp:  [98.1 F (36.7 C)-100.8 F (38.2 C)] 100.8 F (38.2 C) (10/11 0905) Pulse Rate:  [77-148] 148 (10/11 0905) Resp:  [19-41] 41 (10/11 0815) BP: (132-175)/(49-110) 132/61 (10/11 0905) SpO2:  [99 %-100 %] 99 % (10/11 0905) FiO2 (%):  [40 %] 40 % (10/11 0815)  General - Well nourished, well developed elderly African-American lady, intubated on sedation.  Ophthalmologic - fundi not visualized due to noncooperation.  Cardiovascular - Regular rate and rhythm.  Neuro - intubated on propofol, eyes closed.  Eyes in primary position and gaze deviation resolved.  Doll's eye movements are sluggish., not following commands.  Globally aphasic.   not blinking to visual threat bilaterally, doll's eyes sluggish, not tracking on the left visual field  but seems able to track on the right, PERRL. Corneal reflex present, gag and cough present. Breathing over the vent.  Facial symmetry not able to test due to ET tube.  Tongue protrusion not cooperative. Spontaneous movement of RUE and RLE and localize to pain, RUE 3/5 at least and RLE 3-/5 at least. LUE and LLE not spontaneous movement, but mild withdraw to pain. DTR 1+ and no babinski. Sensation, coordination and gait not tested.   ASSESSMENT/PLAN Amber Lopez is a 79 y.o. female with history of depression, HLD, HIV, HTN and DM presenting with left hemiplegia, left facial droop, right eye deviation and aphasia. Received IV tPA 01/29/2020 at 0816. -> IR 01/29/20  Stroke:  R MCA and small L cerebellar infarcts s/p tPA + IR R M1 occlusion w/ TICI2c revascularization, embolic secondary to unknown source possibly hypercoagulability from lung cancer  Code Stroke CT head No acute abnormality. Atrophy. ASPECTS 10.     CTA head & neck LVO R M1. Atherosclerosis. L superior sulcus mass w/ large pleural effusion malignant appearing w/ associated mediastinal  adenopathy.  Cerebral angio / IR - R M1 occlusion s/p TICI2c revascularization using Tiger x 1, embotrap x 1, solitaire x 2.  Post IR CT - no ICH, contrast stain R basal ganglia  MRI  R MCA multifocal infarct w/ mild petechial hemorrhage. Small L cerebellar infarct. Atrophy,  CT Head repeat 01/31/20 - Expected evolution of right MCA nonhemorrhagic infarct. Stable focal subarachnoid hemorrhage near the right MCA bifurcation. Stable atrophy and white matter disease on the left.  CT head 10/11 stable w/ expected evolution infarct w/ slight increase in edema. Stable R SAH. Same small vessel disease, atrophy.  2D Echo EF 55 to 60%, large pleural effusion on the left  LDL 62  HgbA1c 6.5  VTE prophylaxis - SCDs-> Heparin 5000 units sq Q12  No antithrombotic prior to admission, ASA 81 mg daily started 01/31/20  Therapy recommendations:  SNF  Disposition:  pending   Acute Respiratory Failure  Secondary to stroke  Intubated for IR, remains intubated  Sedated w/ propofol  CXR w/ persistent large L pleural effusion w/ atx and consolidation  Ventilating well  CCM on board   Severe anemia   Acute blood loss anemia s/p IR, Hb 13.2->9.2->8.2->6.5 . . . 10.8->11.3->9.6   PRBC transfusion  CT abd/pelvis no retroperitoneal hemorrhage  Right groin oozing from sheath site - compressed - no active bleeding now  CBC monitoring  CCM on board  Large L lung mass  CTA neck - L superior sulcus mass w/ large pleural effusion malignant appearing w/ associated mediastinal adenopathy.  CT chest large left pleural effusion with near complete left lung collapse.  Left apical mass suspicious for bronchogenic carcinoma  CT abd/pelvis no distal metastasis disease  CCM is considering thoracentesis in a.m. once stable  Lt chest tube placed 01/31/20 - Dr Thayer Jew  Possible Seizure 10/11  Worsening R gaze forced deviation in setting of HTN, no shaking or jerking - 10/11 am   EEG  pending  Repeat CT head 02/02/2020 slight increase in infarct now involving right caudate head as well but otherwise stable mass-effect on no midline shift  Hypotension/Hypertension  Home meds:  hctz 25, metoprolol 25  Off phenylephepdirine  . BP goal normotensive  Diabetes type II Controlled  Home meds:  None listed  HgbA1c 6.5, goal < 7.0  CBGs  SSI  DB RN following - recommends adding TF coverage 2u q4h  Dysphagia At risk  malnutrition . Secondary to stroke . NPO . On TF   HIV  HARRP on Biktarvy.   CD4 in 07/2019 242  Other Stroke Risk Factors  Advanced age  Migraines  Other Active Problems  Depression / anxiety  Hx right breast cancer on arimidex  Post IR groin bleeding. Sheath removed using quick clot and pressure held. Stable CKD - stage 4  - creatinine - 0.99 Fever, TMax 101.8  Hospital day # 4 Patient's neurological exam appears to have deteriorated slightly but follow-up CT scan does not show significant neuro change she also spiked a fever and this could be related to infection.  Check EEG for seizures.  Treatment for fever and suspected infection as per critical care team.  Still await cytology results from pleural fluid.  Family is awaiting definite diagnosis of malignancy prior to making decisions about withdrawal of care versus palliative care.  Discussed with Mickel Baas Gleason critical care team physician assistant. This patient is critically ill and at significant risk of neurological worsening, death and care requires constant monitoring of vital signs, hemodynamics,respiratory and cardiac monitoring, extensive review of multiple databases, frequent neurological assessment, discussion with family, other specialists and medical decision making of high complexity.I have made any additions or clarifications directly to the above note.This critical care time does not reflect procedure time, or teaching time or supervisory time of PA/NP/Med Resident etc but  could involve care discussion time.  I spent 30 minutes of neurocritical care time  in the care of  this patient.      Antony Contras, MD  To contact Stroke Continuity provider, please refer to [email or web address] After hours, contact General Neurology

## 2020-02-02 NOTE — Progress Notes (Signed)
Peripherally Inserted Central Catheter Placement  The IV Nurse has discussed with the patient and/or persons authorized to consent for the patient, the purpose of this procedure and the potential benefits and risks involved with this procedure.  The benefits include less needle sticks, lab draws from the catheter, and the patient may be discharged home with the catheter. Risks include, but not limited to, infection, bleeding, blood clot (thrombus formation), and puncture of an artery; nerve damage and irregular heartbeat and possibility to perform a PICC exchange if needed/ordered by physician.  Alternatives to this procedure were also discussed.  Bard Power PICC patient education guide, fact sheet on infection prevention and patient information card has been provided to patient /or left at bedside.  Consent signed by son  PICC Placement Documentation  PICC Triple Lumen 02/02/20 PICC Left Brachial 44 cm 2 cm (Active)  Indication for Insertion or Continuance of Line Vasoactive infusions 02/02/20 2027  Exposed Catheter (cm) 2 cm 02/02/20 2027  Site Assessment Clean;Dry;Intact 02/02/20 2027  Lumen #1 Status Flushed;Saline locked;Blood return noted 02/02/20 2027  Lumen #2 Status Flushed;Saline locked;Blood return noted 02/02/20 2027  Lumen #3 Status Flushed;Saline locked;Blood return noted 02/02/20 2027  Dressing Type Transparent 02/02/20 2027  Dressing Status Clean;Dry;Intact 02/02/20 2027  Antimicrobial disc in place? Yes 02/02/20 2027  Dressing Intervention New dressing 02/02/20 2027  Dressing Change Due 02/09/20 02/02/20 2027       Gordan Payment 02/02/2020, 8:28 PM

## 2020-02-02 NOTE — Progress Notes (Signed)
Patient hypertensive, tachycardic, tachypneic. Fentanyl given as ordered with no relief. Prop restarted, CCM PA at bedside, additional fentanyl and versed given with good effect. Patient calm at this time, Neuro at bedside. STAT CT and EEG ordered.   9:41 AM Esophageal temp 101.  BP now 109/53 HR 103

## 2020-02-02 NOTE — Progress Notes (Signed)
Nutrition Follow-up  DOCUMENTATION CODES:   Non-severe (moderate) malnutrition in context of chronic illness  INTERVENTION:   Continue tube feeds via OG tube: - Osmolite 1.2 @ 40 ml/hr (960 ml/day) - ProSource TF 45 ml TID  Tube feeding regimen provides 1272 kcal, 86 grams of protein, and 787 ml of H2O.   Tube feeding regimen and current propofol provides 1697 total kcal (105% of needs).  NUTRITION DIAGNOSIS:   Moderate Malnutrition related to chronic illness (HIV) as evidenced by mild muscle depletion, moderate muscle depletion, mild fat depletion, moderate fat depletion.  Ongoing, being addressed via TF  GOAL:   Patient will meet greater than or equal to 90% of their needs  Met via TF  MONITOR:   Vent status, Weight trends, TF tolerance, Skin, Labs  REASON FOR ASSESSMENT:   Consult Enteral/tube feeding initiation and management  ASSESSMENT:   79 yo female presents with left sided weakness, facial droop, aphasia and right eye deviation and  admitted with ischemic stoke insetting of large vessel occlusion of right MCA M1 segment requiring revascularization, acute blood loss anemia with concern of retroperitoneal bleed, respiratory failure requiring intubation. PMH includes HIV, HTN, HLD, DM, depression, hx of breast cancer  10/07 - admitted, intubated, s/p revascularization of R MCA M1 occlusion 10/08 - MRI multifocal infarct with mild petechial hemorrhage with small L cerebella infarct 10/09 - chest tube placed for large left pleural effusion  Per notes, pt found to have large left superior sulcus mass with complex pleural effusion.  Discussed pt with RN. Pt tolerating current TF without issue. Will adjust TF regimen due to increase in propofol since RD initial assessment.  Current TF: Osmolite 1.2 @ 50 ml/hr ProSource TF 45 ml daily  Patient is currently intubated on ventilator support MV: 8.6 L/min Temp (24hrs), Avg:99.4 F (37.4 C), Min:98.1 F (36.7 C),  Max:101.8 F (38.8 C)  Drips: Propofol: 16.1 ml/hr (provides 425 kcal daily from lipid) NS: 25 ml/hr  Medications reviewed and include: colace, SSI q 4 hours, protonix, miralax  Labs reviewed: hemoglobin 9.6 CBG's: 141-220 x 24 hours  UOP: 1000 ml x 24 hours CT: 650 ml x 24 hours I/O's: +8.4 L since admit  NUTRITION - FOCUSED PHYSICAL EXAM:    Most Recent Value  Orbital Region Moderate depletion  Upper Arm Region Mild depletion  Thoracic and Lumbar Region Moderate depletion  Buccal Region Unable to assess  Temple Region Moderate depletion  Clavicle Bone Region Moderate depletion  Clavicle and Acromion Bone Region Moderate depletion  Scapular Bone Region Unable to assess  Dorsal Hand Mild depletion  Patellar Region Mild depletion  Anterior Thigh Region Mild depletion  Posterior Calf Region Mild depletion  Edema (RD Assessment) None  Hair Reviewed  Eyes Unable to assess  Mouth Unable to assess  Skin Reviewed  Nails Reviewed       Diet Order:   Diet Order            Diet NPO time specified  Diet effective now                 EDUCATION NEEDS:   Not appropriate for education at this time  Skin:  Skin Assessment: Reviewed RN Assessment  Last BM:  no documented BM  Height:   Ht Readings from Last 1 Encounters:  01/29/20 _0  (1.473 m)    Weight:   Wt Readings from Last 1 Encounters:  01/29/20 53.7 kg    BMI:  Body mass index is 24.74  kg/m.  Estimated Nutritional Needs:   Kcal:  1350-1620 kcals  Protein:  75-90 grams  Fluid:  >/= 1.5 L    Gaynell Face, MS, RD, LDN Inpatient Clinical Dietitian Please see AMiON for contact information.

## 2020-02-02 NOTE — Procedures (Signed)
[name]  MRN: [id number]  Epilepsy Attending: Lora Havens  Referring Physician/Provider: Burnetta Sabin, NP Date: 02/02/1020 Duration: 24.26 minutes  Patient history: 79 year old female with an episode of forced right gaze deviation this morning.  EEG to evaluate for seizures.  Level of alertness: Comatose  AEDs during EEG study: Propofol  Technical aspects: This EEG study was done with scalp electrodes positioned according to the 10-20 International system of electrode placement. Electrical activity was acquired at a sampling rate of 500Hz  and reviewed with a high frequency filter of 70Hz  and a low frequency filter of 1Hz . EEG data were recorded continuously and digitally stored.   Description: EEG showed continuous generalized and maximal right frontotemporal region 3 to 6 Hz theta-delta slowing admixed with 15 to 18 Hz generalized , maximal bifrontal beta activity.  Hyperventilation and photic stimulat were not performed.  ABNORMALITY -Continuous slow, generalized and maximal right frontotemporal region  IMPRESSION: This study is suggestive of cortical dysfunction in right frontotemporal region likely secondary to underlying infarct as well as severe diffuse encephalopathy, nonspecific etiology but likely related to sedation.  No seizures or epileptiform discharges were seen throughout the recording.  Summer Mccolgan Barbra Sarks

## 2020-02-02 NOTE — Progress Notes (Signed)
EEG complete - results pending 

## 2020-02-02 NOTE — Progress Notes (Signed)
Inpatient Diabetes Program Recommendations  AACE/ADA: New Consensus Statement on Inpatient Glycemic Control (2015)  Target Ranges:  Prepandial:   less than 140 mg/dL      Peak postprandial:   less than 180 mg/dL (1-2 hours)      Critically ill patients:  140 - 180 mg/dL   Lab Results  Component Value Date   GLUCAP 183 (H) 02/02/2020   HGBA1C 6.5 (H) 01/29/2020    Review of Glycemic Control Results for Amber Lopez, Amber Lopez (MRN [id number]) as of 02/02/2020 12:22  Ref. Range 02/01/2020 23:28 02/02/2020 03:24 02/02/2020 07:43 02/02/2020 11:33  Glucose-Capillary Latest Ref Range: 70 - 99 mg/dL 220 (H) 200 (H) 205 (H) 183 (H)   Diabetes history: Type 2 DM Outpatient Diabetes medications: none Current orders for Inpatient glycemic control: Novolog 0-9 units Q4H Osmolite @ 50 ml/hr  Inpatient Diabetes Program Recommendations:    Consider adding Novolog 2 units Q4H (tube feed coverage, to be stopped or held in the event tube feeds are stopped).   Thanks, Bronson Curb, MSN, RNC-OB Diabetes Coordinator [phone] (8a-5p)

## 2020-02-02 NOTE — Plan of Care (Signed)
°  Problem: Coping: °Goal: Level of anxiety will decrease °Outcome: Progressing °  °

## 2020-02-02 NOTE — Progress Notes (Signed)
OT Cancellation Note  Patient Details Name: Amber Lopez MRN: [id number] DOB: 28-Jul-1940   Cancelled Treatment:    Reason Eval/Treat Not Completed: Medical issues which prohibited therapy; pt with medical decline today, RN request to hold. Will follow up for OT eval as able.  Lou Cal, OT Acute Rehabilitation Services Pager [phone] Office [phone]   Raymondo Band 02/02/2020, 4:36 PM

## 2020-02-02 NOTE — Progress Notes (Signed)
Patient transported on vent to CT and back to 4N19. No complications noted.

## 2020-02-02 NOTE — Progress Notes (Signed)
Referring Physician(s): Code stroke- Kerney Elbe (neurology)  Supervising Physician: Luanne Bras  Patient Status:  [facility] - In-pt  Chief Complaint: None- intubated without sedation  Subjective:  History of acute CVA s/p cerebral arteriogram with emergent mechanical thrombectomy of right MCA M1 occlusion achieving a TICI 2c revascularization via right femoral approach 01/29/2020 by Dr. Estanislado Pandy. Patient laying in bed intubated without sedation. She opens eyes to voice but does not follow simple commands. Can spontaneously move right side with no spontaneous movements of left side. Right femoral puncture site c/d/i.   Allergies: Sulfa antibiotics  Medications: Prior to Admission medications   Medication Sig Start Date End Date Taking? Authorizing Provider  amoxicillin-clavulanate (AUGMENTIN) 875-125 MG tablet Take 1 tablet by mouth 2 (two) times daily. 01/26/20  Yes Billie Ruddy, MD  anastrozole (ARIMIDEX) 1 MG tablet Take 1 mg by mouth daily.   Yes [provider]  BIKTARVY 50-200-25 MG TABS tablet TAKE 1 TABLET BY MOUTH DAILY 01/27/20  Yes Golden Circle, FNP  hydrochlorothiazide (HYDRODIURIL) 25 MG tablet Take 25 mg by mouth daily.   Yes [provider]  MELATONIN PO Take 6 mg by mouth daily as needed (For sleep).    Yes [provider]  metoprolol succinate (TOPROL-XL) 25 MG 24 hr tablet Take 1 tablet (25 mg total) by mouth daily. 12/31/19  Yes Billie Ruddy, MD  potassium chloride SA (KLOR-CON) 20 MEQ tablet TAKE 1 TABLET(20 MEQ) BY MOUTH DAILY 01/26/20  Yes Billie Ruddy, MD  hydrochlorothiazide (HYDRODIURIL) 25 MG tablet TAKE 1 TABLET(25 MG) BY MOUTH DAILY Patient not taking: Reported on 01/29/2020 01/26/20   Billie Ruddy, MD     Vital Signs: BP (!) 145/61   Pulse 92   Temp 99.5 F (37.5 C)   Resp 19   Ht 4\' 10"  (1.473 m)   Wt 118 lb 6.2 oz (53.7 kg)   SpO2 99%   BMI 24.74 kg/m   Physical Exam Vitals and nursing note  reviewed.  Constitutional:      General: She is not in acute distress.    Comments: Intubated without sedation.  Pulmonary:     Effort: Pulmonary effort is normal. No respiratory distress.     Comments: Intubated without sedation. (+) left chest tube. Skin:    General: Skin is warm and dry.     Comments: Right femoral puncture site soft without active bleeding or hematoma.  Neurological:     Comments: Intubated without sedation. She opens eyes to voice but does not follow simple commands. PERRL bilaterally. Can spontaneously move right side with no spontaneous movements of left side. Right DP 1+.     Imaging: DG Chest 1 View  Result Date: 01/31/2020 CLINICAL DATA:  Chest tube in place. EXAM: CHEST  1 VIEW COMPARISON:  Radiographs 01/30/2020 and 01/29/2020.  CT 01/30/2020. FINDINGS: 1158 hours. Endotracheal and enteric tubes are in stable position. Interval placement of a left chest tube, in good position. Near complete evacuation of the previously demonstrated large left pleural effusion. There is improved aeration of the left lung with a residual left apical mass lesion suspicious for neoplasm. Mild atelectasis remains at the left lung base. The right lung is clear. There is no pneumothorax. The heart size and mediastinal contours are stable with aortic atherosclerosis. Surgical clips are present in the right axilla. IMPRESSION: Interval left chest tube placement with near complete evacuation of large left pleural effusion and improved aeration of the left lung. Persistent left  apical mass suspicious for neoplasm. Electronically Signed   By: Richardean Sale M.D.   On: 01/31/2020 13:02   DG Abd 1 View  Result Date: 01/30/2020 CLINICAL DATA:  Orogastric tube placement. EXAM: ABDOMEN - 1 VIEW COMPARISON:  01/29/2020 FINDINGS: Orogastric tube is been advanced with tip now in the distal stomach. Bowel gas pattern is normal. Residual contrast material is seen in the urinary bladder with Foley  catheter in place. Multiple calcified fibroids again noted in the central pelvis. IMPRESSION: Orogastric tube tip now overlies the distal stomach. Multiple small calcified uterine fibroids. Electronically Signed   By: Marlaine Hind M.D.   On: 01/30/2020 09:28   DG Abd 1 View  Result Date: 01/29/2020 CLINICAL DATA:  Check gastric catheter placement EXAM: ABDOMEN - 1 VIEW COMPARISON:  None. FINDINGS: Gastric catheter is noted in the distal esophagus with the tip at the gastroesophageal junction. This should be advanced several cm. Bladder is distended with contrast material. Foley catheter is noted in place. Nonobstructive bowel gas pattern is seen. IMPRESSION: Gastric catheter in the distal esophagus. This should be advanced several cm deeper into the stomach. Electronically Signed   By: Inez Catalina M.D.   On: 01/29/2020 21:25   CT HEAD WO CONTRAST  Result Date: 01/31/2020 CLINICAL DATA:  Change in mental status. Status post revascularization of the right MCA. EXAM: CT HEAD WITHOUT CONTRAST TECHNIQUE: Contiguous axial images were obtained from the base of the skull through the vertex without intravenous contrast. COMPARISON:  CT head and CTA head 01/29/2020. MR head without contrast 01/30/2000. FINDINGS: Brain: Evolving right MCA nonhemorrhagic infarct involving the superior temporal lobe and right parietal lobe as well as the basal ganglia is again seen. Expected changes are noted. Infarct involves the posterior insular cortex is well. Focal subarachnoid hemorrhage near the right MCA bifurcation is stable. Mass effect is present with some effacement of the right lateral ventricle. No midline shift is present. Atrophy and white matter disease on the left is stable. Insert normal brainstem Vascular: Atherosclerotic calcifications are present within the cavernous internal carotid arteries bilaterally. No distal hyperdense vessel is present. Skull: Calvarium is intact. No focal lytic or blastic lesions are  present. No significant extracranial soft tissue lesion is present. Sinuses/Orbits: The paranasal sinuses and mastoid air cells are clear. The globes and orbits are within normal limits. IMPRESSION: 1. Expected evolution of right MCA nonhemorrhagic infarct. 2. Stable focal subarachnoid hemorrhage near the right MCA bifurcation. 3. Stable atrophy and white matter disease on the left. Electronically Signed   By: San Morelle M.D.   On: 01/31/2020 16:07   CT CHEST W CONTRAST  Result Date: 01/30/2020 CLINICAL DATA:  Right M1 occlusion with left superior sulcus lung mass on neck CTA. Neuro interventional procedure yesterday. Concern for retroperitoneal hemorrhage and malignancy. EXAM: CT CHEST, ABDOMEN, AND PELVIS WITH CONTRAST TECHNIQUE: Multidetector CT imaging of the chest, abdomen and pelvis was performed following the standard protocol during bolus administration of intravenous contrast. CONTRAST:  20mL OMNIPAQUE IOHEXOL 300 MG/ML  SOLN COMPARISON:  Neck CT 01/29/2020. Chest and abdominal radiographs today. FINDINGS: CT CHEST FINDINGS Cardiovascular: Diffuse atherosclerosis of the aorta, great vessels and coronary arteries. No acute vascular findings are seen. The heart size is normal. There is no pericardial effusion. Mediastinum/Nodes: There are small AP window lymph nodes measuring up to 12 mm short axis on image 27/3. No other enlarged mediastinal, hilar or axillary lymph nodes are identified. There are surgical clips in the right axilla. Endotracheal and  nasogastric tubes are in place. The thyroid gland, trachea and esophagus demonstrate no significant findings. Lungs/Pleura: Again demonstrated is a large left pleural effusion with near complete opacification of the left hemithorax. The left lower lobe is completely collapsed, and there is partial collapse of the left upper lobe. There is persistent concern of a left apical mass measuring 3.5 x 3.3 cm on image 17/3. No definite chest wall invasion.  There is no significant right pleural effusion or suspicious right lung nodularity. Dependent opacities in the right lower lobe likely represent atelectasis or inflammation. Musculoskeletal/Chest wall: No chest wall mass or suspicious osseous findings. There are degenerative changes throughout the spine associated with a convex right thoracolumbar scoliosis. CT ABDOMEN AND PELVIS FINDINGS Hepatobiliary: Hepatic evaluation is limited by scanning prior to opacification of the hepatic veins. This likely accounts for mild heterogeneity of the hepatic parenchyma, and no focal lesions are seen on the delayed images obtained through the kidneys. The gallbladder is contracted or surgically absent without associated surgical clips. There is no biliary dilatation. Pancreas: Unremarkable. No pancreatic ductal dilatation or surrounding inflammatory changes. Spleen: Mild heterogeneity of the spleen on the early postcontrast images does not persist on the delayed images, likely perfusion anomalies. The spleen is normal in size. Adrenals/Urinary Tract: Both adrenal glands appear normal. Left renal cortical scarring. No evidence of renal mass, urinary tract calculus or hydronephrosis. The bladder is decompressed by a Foley catheter. Stomach/Bowel: No evidence of bowel wall thickening, distention or surrounding inflammatory change. Enteric tube is looped in the distal stomach. Vascular/Lymphatic: There are no enlarged abdominal or pelvic lymph nodes. Aortic and branch vessel atherosclerosis without acute vascular findings. There is no evidence of retroperitoneal hematoma. The portal, superior mesenteric and splenic veins appear patent. Reproductive: Multiple calcified uterine fibroids.  No adnexal mass. Other: No ascites or free intraperitoneal air. Mild soft tissue stranding in the right groin without significant hematoma. Musculoskeletal: No acute or significant osseous findings. Lumbar facet arthropathy noted. IMPRESSION: 1.  Persistent large left pleural effusion with near complete left lung collapse. There is persistent concern of a left apical mass measuring 3.5 x 3.3 cm, suspicious for bronchogenic carcinoma. Mildly enlarged AP window lymph nodes are nonspecific. Recommend thoracentesis and follow up thoracic CT. 2. No evidence of distant metastatic disease. 3. No acute findings in the abdomen or pelvis. No evidence of retroperitoneal hematoma. 4. Aortic Atherosclerosis (ICD10-I70.0). Electronically Signed   By: Richardean Sale M.D.   On: 01/30/2020 14:36   MR BRAIN WO CONTRAST  Result Date: 01/30/2020 CLINICAL DATA:  Stroke follow-up EXAM: MRI HEAD WITHOUT CONTRAST TECHNIQUE: Multiplanar, multiecho pulse sequences of the brain and surrounding structures were obtained without intravenous contrast. COMPARISON:  CT and CTA from yesterday FINDINGS: Brain: Acute right MCA distribution infarct most confluent along the insula, posterior temporal, and parietal cortex but patchy also in the anterior division cortex. Confluent infarct is seen at the stratum. A small acute infarct is seen in the left cerebellum. Mild petechial hemorrhage in the right cerebrum. Generalized brain atrophy.  No hydrocephalus, mass, or collection. Vascular: Major flow voids are preserved Skull and upper cervical spine: Negative for marrow lesion Sinuses/Orbits: Negative IMPRESSION: 1. Multifocal infarct in the right MCA territory affecting cortex and striatum. Mild petechial hemorrhage. 2. Small acute left cerebellar infarct. 3. Generalized brain atrophy. Electronically Signed   By: Monte Fantasia M.D.   On: 01/30/2020 06:11   CT ABDOMEN PELVIS W CONTRAST  Result Date: 01/30/2020 CLINICAL DATA:  Right M1 occlusion with  left superior sulcus lung mass on neck CTA. Neuro interventional procedure yesterday. Concern for retroperitoneal hemorrhage and malignancy. EXAM: CT CHEST, ABDOMEN, AND PELVIS WITH CONTRAST TECHNIQUE: Multidetector CT imaging of the chest,  abdomen and pelvis was performed following the standard protocol during bolus administration of intravenous contrast. CONTRAST:  54mL OMNIPAQUE IOHEXOL 300 MG/ML  SOLN COMPARISON:  Neck CT 01/29/2020. Chest and abdominal radiographs today. FINDINGS: CT CHEST FINDINGS Cardiovascular: Diffuse atherosclerosis of the aorta, great vessels and coronary arteries. No acute vascular findings are seen. The heart size is normal. There is no pericardial effusion. Mediastinum/Nodes: There are small AP window lymph nodes measuring up to 12 mm short axis on image 27/3. No other enlarged mediastinal, hilar or axillary lymph nodes are identified. There are surgical clips in the right axilla. Endotracheal and nasogastric tubes are in place. The thyroid gland, trachea and esophagus demonstrate no significant findings. Lungs/Pleura: Again demonstrated is a large left pleural effusion with near complete opacification of the left hemithorax. The left lower lobe is completely collapsed, and there is partial collapse of the left upper lobe. There is persistent concern of a left apical mass measuring 3.5 x 3.3 cm on image 17/3. No definite chest wall invasion. There is no significant right pleural effusion or suspicious right lung nodularity. Dependent opacities in the right lower lobe likely represent atelectasis or inflammation. Musculoskeletal/Chest wall: No chest wall mass or suspicious osseous findings. There are degenerative changes throughout the spine associated with a convex right thoracolumbar scoliosis. CT ABDOMEN AND PELVIS FINDINGS Hepatobiliary: Hepatic evaluation is limited by scanning prior to opacification of the hepatic veins. This likely accounts for mild heterogeneity of the hepatic parenchyma, and no focal lesions are seen on the delayed images obtained through the kidneys. The gallbladder is contracted or surgically absent without associated surgical clips. There is no biliary dilatation. Pancreas: Unremarkable. No  pancreatic ductal dilatation or surrounding inflammatory changes. Spleen: Mild heterogeneity of the spleen on the early postcontrast images does not persist on the delayed images, likely perfusion anomalies. The spleen is normal in size. Adrenals/Urinary Tract: Both adrenal glands appear normal. Left renal cortical scarring. No evidence of renal mass, urinary tract calculus or hydronephrosis. The bladder is decompressed by a Foley catheter. Stomach/Bowel: No evidence of bowel wall thickening, distention or surrounding inflammatory change. Enteric tube is looped in the distal stomach. Vascular/Lymphatic: There are no enlarged abdominal or pelvic lymph nodes. Aortic and branch vessel atherosclerosis without acute vascular findings. There is no evidence of retroperitoneal hematoma. The portal, superior mesenteric and splenic veins appear patent. Reproductive: Multiple calcified uterine fibroids.  No adnexal mass. Other: No ascites or free intraperitoneal air. Mild soft tissue stranding in the right groin without significant hematoma. Musculoskeletal: No acute or significant osseous findings. Lumbar facet arthropathy noted. IMPRESSION: 1. Persistent large left pleural effusion with near complete left lung collapse. There is persistent concern of a left apical mass measuring 3.5 x 3.3 cm, suspicious for bronchogenic carcinoma. Mildly enlarged AP window lymph nodes are nonspecific. Recommend thoracentesis and follow up thoracic CT. 2. No evidence of distant metastatic disease. 3. No acute findings in the abdomen or pelvis. No evidence of retroperitoneal hematoma. 4. Aortic Atherosclerosis (ICD10-I70.0). Electronically Signed   By: Richardean Sale M.D.   On: 01/30/2020 14:36   Portable Chest xray  Result Date: 01/30/2020 CLINICAL DATA:  Hypoxia EXAM: PORTABLE CHEST 1 VIEW COMPARISON:  January 29, 2020 FINDINGS: Endotracheal tube tip is 3.7 cm above the carina. Nasogastric tube tip and side port  are below the diaphragm.  No pneumothorax. There is again noted a large left pleural effusion with suspected atelectasis and consolidation throughout much of the left lung. These findings in combination the to opacification of much of the left hemithorax. The right lung is clear. Heart size as normal. The pulmonary vascularity appears grossly normal. No adenopathy evident. There is aortic atherosclerosis. There is upper lumbar levoscoliosis. IMPRESSION: Tube positions as described without pneumothorax. Large persistent left pleural effusion with suspected atelectasis and consolidation throughout much of the left lung as well. Right lung clear. Stable cardiac silhouette. Aortic Atherosclerosis (ICD10-I70.0). Electronically Signed   By: Lowella Grip III M.D.   On: 01/30/2020 07:54   Portable Chest x-ray  Result Date: 01/29/2020 CLINICAL DATA:  Endotracheal tube placement EXAM: PORTABLE CHEST 1 VIEW COMPARISON:  01/15/2020 FINDINGS: The endotracheal tube terminates above the carina by approximately 2.5 cm. There is a large left-sided pleural effusion which has increased in size since the prior study. There is no pneumothorax. Atherosclerotic changes are noted of the thoracic aorta. The heart size is difficult to fully evaluate. There is no definite acute osseous abnormality. IMPRESSION: 1. Endotracheal tube as above. 2. Large left-sided pleural effusion, increased in size since the prior study. Electronically Signed   By: Constance Holster M.D.   On: 01/29/2020 19:03   ECHOCARDIOGRAM COMPLETE  Result Date: 01/30/2020    ECHOCARDIOGRAM REPORT   Patient Name:   Amber Lopez Date of Exam: 01/30/2020 Medical Rec #:  [id number]      Height:       58.0 in Accession #:    [id number]     Weight:       118.4 lb Date of Birth:  03-13-1941       BSA:          1.457 m Patient Age:    49 years       BP:           131/44 mmHg Patient Gender: F              HR:           58 bpm. Exam Location:  Inpatient Procedure: 2D Echo, Cardiac Doppler and  Color Doppler Indications:    Stroke  History:        Patient has no prior history of Echocardiogram examinations.                 Risk Factors:Dyslipidemia, Diabetes and Hypertension.  Sonographer:    Clayton Lefort RDCS (AE) Referring Phys: [id number] ERIC LINDZEN  Sonographer Comments: Echo performed with patient supine and on artificial respirator. IMPRESSIONS  1. Left ventricular ejection fraction, by estimation, is 55 to 60%. The left ventricle has normal function. The left ventricle has no regional wall motion abnormalities. There is mild concentric left ventricular hypertrophy. Left ventricular diastolic parameters are consistent with Grade I diastolic dysfunction (impaired relaxation).  2. Right ventricular systolic function is low normal. The right ventricular size is normal.  3. Large pleural effusion in the left lateral region.  4. The mitral valve is normal in structure. No evidence of mitral valve regurgitation.  5. The aortic valve is normal in structure. Aortic valve regurgitation is not visualized. No aortic stenosis is present. FINDINGS  Left Ventricle: Left ventricular ejection fraction, by estimation, is 55 to 60%. The left ventricle has normal function. The left ventricle has no regional wall motion abnormalities. Definity contrast agent was given IV to delineate the left ventricular  endocardial  borders. The left ventricular internal cavity size was normal in size. There is mild concentric left ventricular hypertrophy. Left ventricular diastolic parameters are consistent with Grade I diastolic dysfunction (impaired relaxation). Right Ventricle: The right ventricular size is normal. No increase in right ventricular wall thickness. Right ventricular systolic function is low normal. Left Atrium: Left atrial size was normal in size. Right Atrium: Right atrial size was normal in size. Pericardium: There is no evidence of pericardial effusion. Mitral Valve: The mitral valve is normal in structure. No evidence  of mitral valve regurgitation. MV peak gradient, 3.3 mmHg. The mean mitral valve gradient is 1.0 mmHg. Tricuspid Valve: The tricuspid valve is grossly normal. Tricuspid valve regurgitation is trivial. Aortic Valve: The aortic valve is normal in structure. Aortic valve regurgitation is not visualized. No aortic stenosis is present. Aortic valve mean gradient measures 2.0 mmHg. Aortic valve peak gradient measures 4.5 mmHg. Aortic valve area, by VTI measures 2.25 cm. Pulmonic Valve: The pulmonic valve was normal in structure. Pulmonic valve regurgitation is not visualized. Aorta: The aortic root and ascending aorta are structurally normal, with no evidence of dilitation. IAS/Shunts: The atrial septum is grossly normal. Additional Comments: There is a large pleural effusion in the left lateral region.  LEFT VENTRICLE PLAX 2D LVIDd:         2.10 cm  Diastology LVIDs:         1.50 cm  LV e' medial:    2.54 cm/s LV PW:         1.20 cm  LV E/e' medial:  31.6 LV IVS:        1.30 cm  LV e' lateral:   3.57 cm/s LVOT diam:     1.60 cm  LV E/e' lateral: 22.5 LV SV:         39 LV SV Index:   27 LVOT Area:     2.01 cm  RIGHT VENTRICLE RV Basal diam:  1.60 cm RV S prime:     7.42 cm/s TAPSE (M-mode): 1.6 cm LEFT ATRIUM             Index       RIGHT ATRIUM          Index LA diam:        2.10 cm 1.44 cm/m  RA Area:     7.15 cm LA Vol (A2C):   35.5 ml 24.36 ml/m RA Volume:   13.00 ml 8.92 ml/m LA Vol (A4C):   28.6 ml 19.63 ml/m LA Biplane Vol: 33.5 ml 22.99 ml/m  AORTIC VALVE AV Area (Vmax):    1.93 cm AV Area (Vmean):   1.97 cm AV Area (VTI):     2.25 cm AV Vmax:           106.00 cm/s AV Vmean:          62.300 cm/s AV VTI:            0.174 m AV Peak Grad:      4.5 mmHg AV Mean Grad:      2.0 mmHg LVOT Vmax:         102.00 cm/s LVOT Vmean:        61.100 cm/s LVOT VTI:          0.195 m LVOT/AV VTI ratio: 1.12  AORTA Ao Root diam: 2.90 cm MITRAL VALVE MV Area (PHT): 3.08 cm    SHUNTS MV Peak grad:  3.3 mmHg    Systemic VTI:   0.20 m MV Mean  grad:  1.0 mmHg    Systemic Diam: 1.60 cm MV Vmax:       0.91 m/s MV Vmean:      46.6 cm/s MV Decel Time: 246 msec MV E velocity: 80.20 cm/s MV A velocity: 99.80 cm/s MV E/A ratio:  0.80 Mertie Moores MD Electronically signed by Mertie Moores MD Signature Date/Time: 01/30/2020/12:41:27 PM    Final     Labs:  CBC: Recent Labs    01/30/20 1122 01/30/20 1819 01/31/20 0512 02/01/20 1036  WBC 9.7 8.6 10.0 14.1*  HGB 6.6* 10.2* 10.8* 11.3*  HCT 20.7* 31.3* 33.0* 35.5*  PLT 196 160 184 211    COAGS: Recent Labs    01/29/20 0754 01/30/20 1021  INR 1.2 1.5*  APTT 28 32    BMP: Recent Labs    03/13/19 1550 08/07/19 1422 08/11/19 1530 08/11/19 1530 01/29/20 0754 01/29/20 0754 01/29/20 0759 01/29/20 1425 01/30/20 0500 01/30/20 1042 01/31/20 0512 02/01/20 1036  NA 138   < > 138   < > 141   < > 140   < > 141 145 141 143  K 4.4   < > 4.0   < > 4.3   < > 4.0   < > 3.1* 3.0* 3.8 4.8  CL 101   < > 100   < > 99   < > 101  --  112*  --  117* 116*  CO2 29   < > 26   < > 25  --   --   --  17*  --  14* 15*  GLUCOSE 114*   < > 237*   < > 153*   < > 151*  --  140*  --  121* 143*  BUN 32*   < > 22   < > 29*   < > 32*  --  27*  --  21 15  CALCIUM 9.7   < > 9.8   < > 9.5  --   --   --  7.4*  --  7.3* 8.0*  CREATININE 1.45*   < > 1.26*  --  1.69*   < > 1.60*  --  1.55*  --  1.47* 1.31*  GFRNONAA 34*  --  41*  --  28*  --   --   --  32*  --  34* 39*  GFRAA 40*  --  47*  --   --   --   --   --   --   --   --   --    < > = values in this interval not displayed.    LIVER FUNCTION TESTS: Recent Labs    03/13/19 1550 08/11/19 1530 01/29/20 0754  BILITOT 0.3 0.3 1.0  AST 16 14 21   ALT 9 7 12   ALKPHOS  --   --  80  PROT 7.6 7.5 7.7  ALBUMIN  --   --  2.8*    Assessment and Plan:  History of acute CVA s/p cerebral arteriogram with emergent mechanical thrombectomy of right MCA M1 occlusion achieving a TICI 2c revascularization via right femoral approach 01/29/2020 by Dr.  Estanislado Pandy. Patient's condition stable- remains intubated without sedation, opens eyes to voice but does not follow simple commands, can spontaneously move right side with no spontaneous movements of left side. Right femoral puncture site stable, right DP 1+. Left chest tube stable. Further plans per neurology/CCM- appreciate and agree with management. Please call [facility] with questions/concerns.  Electronically Signed: Earley Abide, PA-C 02/02/2020, 8:59 AM   I spent a total of 25 Minutes at the the patient's bedside AND on the patient's hospital floor or unit, greater than 50% of which was counseling/coordinating care for CVA s/p revascularization.

## 2020-02-02 NOTE — Progress Notes (Addendum)
NAME:  Amber Lopez, MRN:  [id number], DOB:  1940/05/03, LOS: 4 ADMISSION DATE:  01/29/2020, CONSULTATION DATE:  10/7 REFERRING MD: Dr. Estanislado Pandy, CHIEF COMPLAINT: Left Sided Weakness  Brief History   79 y/o F, with HIV, admitted 10/7 with LVO of R M1 s/p tPA, neuro IR revascularization.  Returned to ICU post procedure on mechanical ventilation. Additional finding of left superior sulcus mass and complex effusion.   History of present illness   79 y/o F who presented to [facility] on 10/7 with acute onset left sided weakness, facial droop, aphasia and right eye deviation. She was last known normal at 0640 when her daughter assisted her with am medications.  The patient was in bed but reportedly normal at that time.  Her daughter left and returned home finding her mother with weakness on the left side and unable to speak.  EMS was activated and CODE STROKE was activated in the field.  On arrival to the ER the patient was documented to have left sided hemiparesis, left facial droop, non-verbal and right gaze deviation. Initial CT of the head was negative for acute findings.  CTA head/neck demonstrated  Past Medical History  HIV/AIDS, HTN, HLD, DM, Depression , Arthritis, Breast Cancer   Significant Hospital Events   10/07 Admit with L hemiplegia, facial droop, aphasia with right eye deviation 10/11 Episode of worsened R eye deviation with tachycardia and HTN, concern for sz  Consults:  Neurology, IR   Procedures:  ETT 10/7 >> L Radial ALine 10/7 >>   R Femoral Sheath 10/7 >>   Significant Diagnostic Tests:  10/7 CT Head Code Stroke >> no acute finding, generalized atrophy  10/7 CTA Head/Neck >> emergent LVO at the right M1 segment, no visible embolic source, atherosclerosis without flow limiting stenosis or ulceration of major vessels, left superior sulcus mass with large complex pleural effusion, associated mediastinal adenopathy  10/7 CT ABD w/contrast >> Negative for hematoma 10/7 CT Chest  w/contrast >> Persistent large left pleural effusion with near complete left lung collapse. Left apical mass measuring 3.5 x 3.3 cm suspicious for bronchogenic carcinoma 10/11 CT head>>no acute findings 10/11 EEG>>Continuous slow, generalized and maximal right frontotemporal region  Micro Data:  COVID 10/7 >> negative  Influenza A/B 10/7 >> negative Pleural fluid culture>>  Antimicrobials:  N/A  Interim history/subjective:  Episode of rigidity with worsened R eye deviation, HTN and tachycardia. Improved with bolus of Fentanyl and Versed.  Concern for possible sz  Objective   Blood pressure (!) 109/53, pulse (!) 101, temperature (!) 101.8 F (38.8 C), temperature source Esophageal, resp. rate (!) 41, height 4\' 10"  (1.473 m), weight 53.7 kg, SpO2 100 %.    Vent Mode: PRVC FiO2 (%):  [40 %] 40 % Set Rate:  [16 bmp] 16 bmp Vt Set:  [330 mL] 330 mL PEEP:  [5 cmH20] 5 cmH20 Pressure Support:  [12 cmH20] 12 cmH20 Plateau Pressure:  [16 cmH20-21 cmH20] 16 cmH20   Intake/Output Summary (Last 24 hours) at 02/02/2020 1005 Last data filed at 02/02/2020 0900 Gross per 24 hour  Intake 1374.72 ml  Output 1400 ml  Net -25.28 ml   Filed Weights   01/29/20 0700  Weight: 53.7 kg    General:  Elderly, critical ill F intubated and sedated HEENT: MM pink/moist, ETT in place Neuro: unresponsive, rigid with R eye deviation, no tonic-clonic movement  CV: s1s2 tachycardic, no m/r/g PULM:  On full vent support, lungs clear without wheezing or rhonchi. L chest tube  with blood-tinged serosanguinous drainage GI: soft, bsx4 active  Extremities: warm/dry, no edema  Skin: no rashes or lesions   Resolved Hospital Problem list   AKI  Assessment & Plan:   Stroke in setting of Large Vessel Occlusion of Right M1 Segment - Embolic vs Ischemic S/P tPA and endovascular revascularization. No ICH post procedure on CT, contrast stain in the right basal ganglia. MR with multi-focal infarct.  No acute  findings after episode of worsened R eye dev.  Neurology following. TTE negative for vegetations or thrombosis.  Remains intubated as mental status precludes extubation -Management per neurology, continue statin and Asa  -glycemic control -Goal SBP 120-160 -SBP goal 120-160, Neosynephrine resumed this morning --Maintain full vent support with SAT/SBT as tolerated -titrate Vent setting to maintain SpO2 greater than or equal to 90%. -HOB elevated 30 degrees. -Plateau pressures less than 30 cm H20.  -Follow chest x-ray, ABG prn.   -Bronchial hygiene and RT/bronchodilator protocol.    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 No evidence of distant metastatic disease, new finding this admission -No growth on culture, elevated fluid LDH, unfortunately no serum protein or LDH available -awaiting cytology -Chest tube in place and draining bloody, serosanguinous fluid   Fever and Hypotension AM of 10/11, may be neurogenic, though UA on admission with leuks and few bacteria. Repeat CXR without clear infiltrate -Check lactic acid, 500cc bolus, Neo to maintain MAP >65 -Blood and urine cultures -no leukocytosis, if lactic elevated consider empiric abx   History of HTN -Hypotensive, holding home meds   HIV/AIDS Last CD4 count 242 in 07/2019 - Restarted Descovy and Tivicay -check CD4  T2DM  - A1c of 6.5% -SSI  Nutrition -Start TF today  Hx of Breast Cancer  - Hold home anastrozole     Best practice:  Diet: NPO Pain/Anxiety/Delirium protocol (if indicated): Ordered VAP protocol (if indicated): In place  DVT prophylaxis: SCD's  GI prophylaxis: PPI  Glucose control: n/a  Mobility: Bed Rest  Code Status: Full Code  Family Communication:  Pt's daughter and son updated  Labs   CBC: Recent Labs  Lab 01/29/20 0754 01/29/20 0759 01/30/20 0500 01/30/20 0500 01/30/20 1021  01/30/20 1042 01/30/20 1122 01/30/20 1819 01/31/20 0512 02/01/20 1036  WBC 9.1   < > 10.6*  --   --   --  9.7 8.6 10.0 14.1*  NEUTROABS 7.1  --  8.7*  --   --   --   --   --   --   --   HGB 13.2   < > 8.2*   < >  --  6.5* 6.6* 10.2* 10.8* 11.3*  HCT 42.3   < > 24.8*   < >  --  19.0* 20.7* 31.3* 33.0* 35.5*  MCV 94.6   < > 92.5  --   --   --  93.2 89.7 90.2 89.9  PLT 271   < > 261   < > 213  --  196 160 184 211   < > = values in this interval not displayed.    Basic Metabolic Panel: Recent Labs  Lab 01/29/20 0754 01/29/20 0754 01/29/20 0759 01/29/20 1425 01/29/20 1637 01/30/20 0500 01/30/20 1042 01/31/20 0512 02/01/20 1036  NA 141   < > 140   < > 140 141 145 141 143  K 4.3   < > 4.0   < > 3.5 3.1* 3.0*  3.8 4.8  CL 99  --  101  --   --  112*  --  117* 116*  CO2 25  --   --   --   --  17*  --  14* 15*  GLUCOSE 153*  --  151*  --   --  140*  --  121* 143*  BUN 29*  --  32*  --   --  27*  --  21 15  CREATININE 1.69*  --  1.60*  --   --  1.55*  --  1.47* 1.31*  CALCIUM 9.5  --   --   --   --  7.4*  --  7.3* 8.0*   < > = values in this interval not displayed.   GFR: Estimated Creatinine Clearance: 25.3 mL/min (A) (by C-G formula based on SCr of 1.31 mg/dL (H)). Recent Labs  Lab 01/30/20 1122 01/30/20 1819 01/31/20 0512 02/01/20 1036  WBC 9.7 8.6 10.0 14.1*    Liver Function Tests: Recent Labs  Lab 01/29/20 0754  AST 21  ALT 12  ALKPHOS 80  BILITOT 1.0  PROT 7.7  ALBUMIN 2.8*   No results for input(s): LIPASE, AMYLASE in the last 168 hours. No results for input(s): AMMONIA in the last 168 hours.  ABG    Component Value Date/Time   PHART 7.419 01/30/2020 1042   PCO2ART 26.5 (L) 01/30/2020 1042   PO2ART 143 (H) 01/30/2020 1042   HCO3 17.3 (L) 01/30/2020 1042   TCO2 18 (L) 01/30/2020 1042   ACIDBASEDEF 7.0 (H) 01/30/2020 1042   O2SAT 99.0 01/30/2020 1042     Coagulation Profile: Recent Labs  Lab 01/29/20 0754 01/30/20 1021  INR 1.2 1.5*     Cardiac Enzymes: No results for input(s): CKTOTAL, CKMB, CKMBINDEX, TROPONINI in the last 168 hours.  HbA1C: Hgb A1c MFr Bld  Date/Time Value Ref Range Status  01/29/2020 04:22 PM 6.5 (H) 4.8 - 5.6 % Final    Comment:    (NOTE) Pre diabetes:          5.7%-6.4%  Diabetes:              >6.4%  Glycemic control for   <7.0% adults with diabetes     CBG: Recent Labs  Lab 02/01/20 1555 02/01/20 1927 02/01/20 2328 02/02/20 0324 02/02/20 0743  GLUCAP 141* 145* 220* 200* 205*    Review of Systems:   Unable to complete as patient is altered on mechanical ventilation.   Past Medical History  She,  has a past medical history of Arthritis, Depression, Diabetes mellitus without complication ([place]), Eating disorder, Hyperlipidemia, Hypertension, and Migraines.   Surgical History    Past Surgical History:  Procedure Laterality Date  . BREAST BIOPSY    . BREAST LUMPECTOMY Right    Years ago / Pt's daughter thinks it was cancer  . RADIOLOGY WITH ANESTHESIA N/A 01/29/2020   Procedure: IR WITH ANESTHESIA - CODE STROKE;  Surgeon: Radiologist, Medication, MD;  Location: [place];  Service: Radiology;  Laterality: N/A;     Social History   reports that she has never smoked. She has never used smokeless tobacco. She reports that she does not drink alcohol and does not use drugs.   Family History   Her family history includes Breast cancer (age of onset: 30) in her sister.   Allergies Allergies  Allergen Reactions  . Sulfa Antibiotics Swelling and Rash     Home Medications  Prior to Admission medications  Medication Sig Start Date End Date Taking? Authorizing Provider  amoxicillin-clavulanate (AUGMENTIN) 875-125 MG tablet Take 1 tablet by mouth 2 (two) times daily. 01/26/20   Billie Ruddy, MD  anastrozole (ARIMIDEX) 1 MG tablet Take 1 mg by mouth daily.    [provider]  BIKTARVY 50-200-25 MG TABS tablet TAKE 1 TABLET BY MOUTH DAILY 01/27/20   Golden Circle,  FNP  hydrochlorothiazide (HYDRODIURIL) 25 MG tablet TAKE 1 TABLET(25 MG) BY MOUTH DAILY 01/26/20   Billie Ruddy, MD  MELATONIN PO Take 6 mg by mouth at bedtime.    [provider]  metoprolol succinate (TOPROL-XL) 25 MG 24 hr tablet Take 1 tablet (25 mg total) by mouth daily. 12/31/19   Billie Ruddy, MD  potassium chloride SA (KLOR-CON) 20 MEQ tablet TAKE 1 TABLET(20 MEQ) BY MOUTH DAILY 01/26/20   Billie Ruddy, MD    CRITICAL CARE Performed by: Otilio Carpen Kirsta Probert   Total critical care time: 42 minutes  Critical care time was exclusive of separately billable procedures and treating other patients.  Critical care was necessary to treat or prevent imminent or life-threatening deterioration.  Critical care was time spent personally by me on the following activities: development of treatment plan with patient and/or surrogate as well as nursing, discussions with consultants, evaluation of patient's response to treatment, examination of patient, obtaining history from patient or surrogate, ordering and performing treatments and interventions, ordering and review of laboratory studies, ordering and review of radiographic studies, pulse oximetry and re-evaluation of patient's condition.   Otilio Carpen Keymiah Lyles, PA-C  PCCM  Pager# [phone], if no answer [phone]

## 2020-02-03 DIAGNOSIS — I6300 Cerebral infarction due to thrombosis of unspecified precerebral artery: Secondary | ICD-10-CM | POA: Diagnosis not present

## 2020-02-03 DIAGNOSIS — I63511 Cerebral infarction due to unspecified occlusion or stenosis of right middle cerebral artery: Secondary | ICD-10-CM | POA: Diagnosis not present

## 2020-02-03 DIAGNOSIS — E440 Moderate protein-calorie malnutrition: Secondary | ICD-10-CM | POA: Insufficient documentation

## 2020-02-03 DIAGNOSIS — C3492 Malignant neoplasm of unspecified part of left bronchus or lung: Secondary | ICD-10-CM

## 2020-02-03 LAB — BPAM RBC
Blood Product Expiration Date: 202111042359
Blood Product Expiration Date: 202111042359
Blood Product Expiration Date: 202111042359
Blood Product Expiration Date: 202111042359
ISSUE DATE / TIME: 202110081225
ISSUE DATE / TIME: 202110081225
Unit Type and Rh: 7300
Unit Type and Rh: 7300
Unit Type and Rh: 7300
Unit Type and Rh: 7300

## 2020-02-03 LAB — BLOOD CULTURE ID PANEL (REFLEXED) - BCID2

## 2020-02-03 LAB — TYPE AND SCREEN
ABO/RH(D): B POS
Antibody Screen: NEGATIVE
Unit division: 0
Unit division: 0
Unit division: 0
Unit division: 0

## 2020-02-03 LAB — BASIC METABOLIC PANEL
Anion gap: 7 (ref 5–15)
BUN: 15 mg/dL (ref 8–23)
CO2: 19 mmol/L — ABNORMAL LOW (ref 22–32)
Calcium: 7 mg/dL — ABNORMAL LOW (ref 8.9–10.3)
Chloride: 114 mmol/L — ABNORMAL HIGH (ref 98–111)
Creatinine, Ser: 0.86 mg/dL (ref 0.44–1.00)
GFR, Estimated: >60 mL/min (ref >60)
Glucose, Bld: 207 mg/dL — ABNORMAL HIGH (ref 70–99)
Potassium: 3.4 mmol/L — ABNORMAL LOW (ref 3.5–5.1)
Sodium: 140 mmol/L (ref 135–145)

## 2020-02-03 LAB — BODY FLUID CULTURE: Culture: NO GROWTH

## 2020-02-03 LAB — CBC
HCT: 27.5 % — ABNORMAL LOW (ref 36.0–46.0)
Hemoglobin: 8.7 g/dL — ABNORMAL LOW (ref 12.0–15.0)
MCH: 28.4 pg (ref 26.0–34.0)
MCHC: 31.6 g/dL (ref 30.0–36.0)
MCV: 89.9 fL (ref 80.0–100.0)
Platelets: 208 10*3/uL (ref 150–400)
RBC: 3.06 MIL/uL — ABNORMAL LOW (ref 3.87–5.11)
RDW: 15.3 % (ref 11.5–15.5)
WBC: 11 10*3/uL — ABNORMAL HIGH (ref 4.0–10.5)
nRBC: 0.3 % — ABNORMAL HIGH (ref 0.0–0.2)

## 2020-02-03 LAB — T-HELPER CELLS (CD4) COUNT (NOT AT ARMC)
CD4 % Helper T Cell: 15 % — ABNORMAL LOW (ref 33–65)
CD4 T Cell Abs: 185 /uL — ABNORMAL LOW (ref 400–1790)

## 2020-02-03 LAB — PHOSPHORUS: Phosphorus: 1.2 mg/dL — ABNORMAL LOW (ref 2.5–4.6)

## 2020-02-03 LAB — GLUCOSE, CAPILLARY
Glucose-Capillary: 154 mg/dL — ABNORMAL HIGH (ref 70–99)
Glucose-Capillary: 216 mg/dL — ABNORMAL HIGH (ref 70–99)
Glucose-Capillary: 212 mg/dL — ABNORMAL HIGH (ref 70–99)
Glucose-Capillary: 230 mg/dL — ABNORMAL HIGH (ref 70–99)
Glucose-Capillary: 234 mg/dL — ABNORMAL HIGH (ref 70–99)
Glucose-Capillary: 163 mg/dL — ABNORMAL HIGH (ref 70–99)
Glucose-Capillary: 176 mg/dL — ABNORMAL HIGH (ref 70–99)

## 2020-02-03 LAB — TRIGLYCERIDES: Triglycerides: 110 mg/dL (ref <150)

## 2020-02-03 LAB — MAGNESIUM: Magnesium: 1.4 mg/dL — ABNORMAL LOW (ref 1.7–2.4)

## 2020-02-03 MED ORDER — POTASSIUM PHOSPHATES 15 MMOLE/5ML IV SOLN
45.0000 mmol | Freq: Once | INTRAVENOUS | Status: AC
  Administered 2020-02-03: 45 mmol via INTRAVENOUS
  Filled 2020-02-03: qty 15

## 2020-02-03 MED ORDER — K PHOS MONO-SOD PHOS DI & MONO 155-852-130 MG PO TABS
500.0000 mg | ORAL_TABLET | Freq: Once | ORAL | Status: AC
  Administered 2020-02-03: 500 mg
  Filled 2020-02-03: qty 2

## 2020-02-03 MED ORDER — ENOXAPARIN SODIUM 40 MG/0.4ML ~~LOC~~ SOLN
40.0000 mg | SUBCUTANEOUS | Status: DC
  Administered 2020-02-04 – 2020-03-22 (×46): 40 mg via SUBCUTANEOUS
  Filled 2020-02-03 (×47): qty 0.4

## 2020-02-03 MED ORDER — MAGNESIUM SULFATE 2 GM/50ML IV SOLN
2.0000 g | Freq: Once | INTRAVENOUS | Status: AC
  Administered 2020-02-03: 2 g via INTRAVENOUS
  Filled 2020-02-03: qty 50

## 2020-02-03 MED ORDER — K PHOS MONO-SOD PHOS DI & MONO 155-852-130 MG PO TABS
500.0000 mg | ORAL_TABLET | Freq: Once | ORAL | Status: DC
  Filled 2020-02-03: qty 2

## 2020-02-03 NOTE — Progress Notes (Signed)
PT Cancellation Note  Patient Details Name: Amber Lopez MRN: [id number] DOB: 12-Jan-1941   Cancelled Treatment:    Reason Eval/Treat Not Completed: Medical issues which prohibited therapy - MD calling family to determine POC, may be transitioning to comfort. RN requests PT check back later.  Marisa Cyphers, PT Acute Rehabilitation Services Pager [phone]  Office [phone]     Roxine Caddy D Elonda Husky 02/03/2020, 10:16 AM

## 2020-02-03 NOTE — Progress Notes (Signed)
Inpatient Diabetes Program Recommendations  AACE/ADA: New Consensus Statement on Inpatient Glycemic Control (2015)  Target Ranges:  Prepandial:   less than 140 mg/dL      Peak postprandial:   less than 180 mg/dL (1-2 hours)      Critically ill patients:  140 - 180 mg/dL   Lab Results  Component Value Date   GLUCAP 230 (H) 02/03/2020   HGBA1C 6.5 (H) 01/29/2020    Review of Glycemic Control Results for Amber Lopez, Amber Lopez (MRN [id number]) as of 02/03/2020 12:00  Ref. Range 02/03/2020 07:39 02/03/2020 08:57 02/03/2020 11:28  Glucose-Capillary Latest Ref Range: 70 - 99 mg/dL 216 (H) 212 (H) 230 (H)   Diabetes history: Type 2 DM Outpatient Diabetes medications: none Current orders for Inpatient glycemic control: Novolog 0-9 units Q4H Osmolite @ 40 ml/hr   Inpatient Diabetes Program Recommendations:    Consider adding Novolog 2 units Q4H (tube feed coverage, to be stopped or held in the event tube feeds are stopped).   Will continue to follow while inpatient.  Thank you, Reche Dixon, RN, BSN Diabetes Coordinator Inpatient Diabetes Program [phone] (team pager from 8a-5p)

## 2020-02-03 NOTE — Progress Notes (Signed)
STROKE TEAM PROGRESS NOTE   INTERVAL HISTORY Patient neurological exam remains unchanged.  She continues to have right gaze deviation, left field cut and dense left hemiplegia.  She does not follow commands.  She remains on ventilatory support for respiratory failure.  She was unable to tolerate CPAP this morning.  Pleural fluid cytology is positive for metastatic adenocarcinoma cells.  Vital signs are stable.  Continues to have low-grade fever and fever curve has improved.  Chest x-ray shows no clear infiltrate.  Lactic acid within normal limits and she is off pressors Vitals:   02/03/20 0600 02/03/20 0700 02/03/20 0821 02/03/20 0900  BP: (!) 114/40 119/69  (!) 145/82  Pulse: 85 92  (!) 112  Resp:   (!) 36   Temp: 99.5 F (37.5 C) (!) 100.6 F (38.1 C)  (!) 100.9 F (38.3 C)  TempSrc: Esophageal     SpO2: 100% 100%  100%  Weight:      Height:       CBC:  Recent Labs  Lab 01/29/20 0754 01/29/20 0759 01/30/20 0500 01/30/20 1021 02/02/20 1011 02/03/20 0545  WBC 9.1   < > 10.6*   < > 8.1 11.0*  NEUTROABS 7.1  --  8.7*  --   --   --   HGB 13.2   < > 8.2*   < > 9.6* 8.7*  HCT 42.3   < > 24.8*   < > 28.8* 27.5*  MCV 94.6   < > 92.5   < > 87.8 89.9  PLT 271   < > 261   < > 214 208   < > = values in this interval not displayed.   Basic Metabolic Panel:  Recent Labs  Lab 02/02/20 1011 02/03/20 0545  NA 139 140  K 3.6 3.4*  CL 115* 114*  CO2 17* 19*  GLUCOSE 227* 207*  BUN 15 15  CREATININE 0.99 0.86  CALCIUM 7.6* 7.0*  MG  --  1.4*  PHOS  --  1.2*   Lipid Panel:  Recent Labs  Lab 01/30/20 0500 01/31/20 0512 02/03/20 0545  CHOL 119  --   --   TRIG 207*  206*   < > 110  HDL 16*  --   --   CHOLHDL 7.4  --   --   VLDL 41*  --   --   LDLCALC 62  --   --    < > = values in this interval not displayed.   HgbA1c:  Recent Labs  Lab 01/29/20 1622  HGBA1C 6.5*   Urine Drug Screen:  Recent Labs  Lab 01/30/20 1118  LABOPIA NONE DETECTED  COCAINSCRNUR NONE  DETECTED  LABBENZ NONE DETECTED  AMPHETMU NONE DETECTED  THCU NONE DETECTED  LABBARB NONE DETECTED    Alcohol Level No results for input(s): ETH in the last 168 hours.  IMAGING past 24 hours CT HEAD WO CONTRAST  Result Date: 02/02/2020 CLINICAL DATA:  Stroke follow-up EXAM: CT HEAD WITHOUT CONTRAST TECHNIQUE: Contiguous axial images were obtained from the base of the skull through the vertex without intravenous contrast. COMPARISON:  CT head 01/31/2020 FINDINGS: Brain: Evolving right MCA territory infarct involving the superior temporal lobe, right parietal lobe, and basal ganglia with slightly decreased attenuation. Slightly increased edema associated with the caudate head and right frontal white matter. Similar local mass effect with sulcal effacement. No significant midline shift. Basal cisterns are patent. Similar volume/appearance of subarachnoid hemorrhage near the right MCA bifurcation. No evidence of  new hemorrhage. Similar generalized atrophy with ex vacuo ventricular dilation. No hydrocephalus. Similar additional patchy white matter hypoattenuation, compatible with chronic microvascular ischemic disease. Vascular: Calcific atherosclerosis. Skull: Normal. Negative for fracture or focal lesion. Sinuses/Orbits: The paranasal sinuses are clear. No acute orbital abnormality. Other: No mastoid effusion. IMPRESSION: 1. Expected evolution of a right MCA territory infarct with slightly increased edema associated with the caudate head and right frontal white matter. No progressive mass effect. 2. Similar focal subarachnoid hemorrhage near the right MCA bifurcation. No evidence of new hemorrhage. 3. Similar chronic microvascular ischemic disease and atrophy. Electronically Signed   By: Margaretha Sheffield MD   On: 02/02/2020 10:51   DG CHEST PORT 1 VIEW  Result Date: 02/02/2020 CLINICAL DATA:  Tachypnea. EXAM: PORTABLE CHEST 1 VIEW COMPARISON:  January 31, 2020. FINDINGS: Stable cardiomediastinal  silhouette. Endotracheal and nasogastric tubes are unchanged in position. Left-sided chest tube is noted without pneumothorax. Right lung is clear. Mild left basilar subsegmental atelectasis is noted with probable small left pleural effusion. Bony thorax is unremarkable. IMPRESSION: Stable support apparatus. Mild left basilar subsegmental atelectasis is noted with probable small left pleural effusion. No pneumothorax is noted. Electronically Signed   By: Marijo Conception M.D.   On: 02/02/2020 11:28   EEG adult  Result Date: 02/02/2020 Lora Havens, MD     02/02/2020  2:18 PM [name] MRN: [id number] Epilepsy Attending: Lora Havens Referring Physician/Provider: Burnetta Sabin, NP Date: 02/02/1020 Duration: 24.26 minutes Patient history: 79 year old female with an episode of forced right gaze deviation this morning.  EEG to evaluate for seizures. Level of alertness: Comatose AEDs during EEG study: Propofol Technical aspects: This EEG study was done with scalp electrodes positioned according to the 10-20 International system of electrode placement. Electrical activity was acquired at a sampling rate of _0  and reviewed with a high frequency filter of _1  and a low frequency filter of _2 . EEG data were recorded continuously and digitally stored. Description: EEG showed continuous generalized and maximal right frontotemporal region 3 to 6 Hz theta-delta slowing admixed with 15 to 18 Hz generalized , maximal bifrontal beta activity.  Hyperventilation and photic stimulat were not performed. ABNORMALITY -Continuous slow, generalized and maximal right frontotemporal region IMPRESSION: This study is suggestive of cortical dysfunction in right frontotemporal region likely secondary to underlying infarct as well as severe diffuse encephalopathy, nonspecific etiology but likely related to sedation.  No seizures or epileptiform discharges were seen throughout the recording. Amber Lopez    [place] EKG SITE RITE  Result Date: 02/02/2020 If Site Rite image not attached, placement could not be confirmed due to current cardiac rhythm.   PHYSICAL EXAM    Temp:  [98.6 F (37 C)-100.9 F (38.3 C)] 100.9 F (38.3 C) (10/12 0900) Pulse Rate:  [67-117] 112 (10/12 0900) Resp:  [25-36] 36 (10/12 0821) BP: (69-175)/(39-141) 145/82 (10/12 0900) SpO2:  [99 %-100 %] 100 % (10/12 0900) FiO2 (%):  [40 %] 40 % (10/12 8185)  General - Well nourished, well developed elderly African-American lady, intubated on sedation.  Ophthalmologic - fundi not visualized due to noncooperation.  Cardiovascular - Regular rate and rhythm.  Neuro - intubated on propofol, eyes closed.  Deviated to right.  Doll's eye movements are sluggish., not following commands.  Globally aphasic.   not blinking to visual threat bilaterally, doll's eyes sluggish, not tracking on the left visual field but seems able to track on the right, PERRL. Corneal reflex present, gag and  cough present. Breathing over the vent.  Facial symmetry not able to test due to ET tube.  Tongue protrusion not cooperative. Spontaneous movement of RUE and RLE and localize to pain, RUE 3/5 at least and RLE 3-/5 at least. LUE and LLE not spontaneous movement, but mild withdraw to pain. DTR 1+ and no babinski. Sensation, coordination and gait not tested.   ASSESSMENT/PLAN Amber Lopez is a 79 y.o. female with history of depression, HLD, HIV, HTN and DM presenting with left hemiplegia, left facial droop, right eye deviation and aphasia. Received IV tPA 01/29/2020 at 0816. -> IR 01/29/20  Stroke:  R MCA and small L cerebellar infarcts s/p tPA + IR R M1 occlusion w/ TICI2c revascularization, embolic secondary to unknown source possibly hypercoagulability from lung cancer  Code Stroke CT head No acute abnormality. Atrophy. ASPECTS 10.     CTA head & neck LVO R M1. Atherosclerosis. L superior sulcus mass w/ large pleural effusion malignant appearing  w/ associated mediastinal adenopathy.  Cerebral angio / IR - R M1 occlusion s/p TICI2c revascularization using Tiger x 1, embotrap x 1, solitaire x 2.  Post IR CT - no ICH, contrast stain R basal ganglia  MRI  R MCA multifocal infarct w/ mild petechial hemorrhage. Small L cerebellar infarct. Atrophy,  CT Head repeat 01/31/20 - Expected evolution of right MCA nonhemorrhagic infarct. Stable focal subarachnoid hemorrhage near the right MCA bifurcation. Stable atrophy and white matter disease on the left.  CT head 10/11 stable w/ expected evolution infarct w/ slight increase in edema. Stable R SAH. Same small vessel disease, atrophy.  2D Echo EF 55 to 60%, large pleural effusion on the left  LDL 62  HgbA1c 6.5  VTE prophylaxis - Lovenox 40 mg sq daily   No antithrombotic prior to admission, ASA 81 mg daily started 01/31/20  Therapy recommendations:  SNF  Disposition:  pending   Acute Respiratory Failure  Secondary to stroke  Intubated for IR, remains intubated  Sedated w/ propofol  CXR w/ persistent large L pleural effusion w/ atx and consolidation  Ventilating well  CCM on board   Severe anemia   Acute blood loss anemia s/p IR, Hb 13.2->9.2->8.2->6.5 . . . 10.8->11.3->9.6->8.7   PRBC transfusion  CT abd/pelvis no retroperitoneal hemorrhage  Right groin oozing from sheath site - compressed - no active bleeding now  CBC monitoring  Ongoing dropping of Hgb  CCM on board  Large L lung mass  CTA neck - L superior sulcus mass w/ large pleural effusion malignant appearing w/ associated mediastinal adenopathy.  CT chest large left pleural effusion with near complete left lung collapse.  Left apical mass suspicious for bronchogenic carcinoma  CT abd/pelvis no distal metastasis disease  Lt chest tube placed 01/31/20 - Dr Thayer Jew  cytology of pleural fluid - Malignant cells consistent with metastatic adenocarcinoma   Dr. Leonie Man shared results w/ son. He and daughter  will come to the hospital. Dr. Leonie Man to meet with them. Anticipate comfort care.   Possible Seizure 10/11  Worsening R gaze forced deviation in setting of HTN, no shaking or jerking - 10/11 am   EEG continuous slowing  Repeat CT head 02/02/2020 slight increase in infarct now involving right caudate head as well but otherwise stable mass-effect on no midline shift  Hypotension/Hypertension  Home meds:  hctz 25, metoprolol 25  Off phenylephepdirine  . BP goal normotensive  Diabetes type II Controlled  Home meds:  None listed  HgbA1c 6.5, goal <  7.0  CBGs  SSI  DB RN following - I added TF coverage 2u q4h 10/11, d/c'd by CCM. Will defer to them.  Dysphagia At risk malnutrition . Secondary to stroke . NPO . On TF   HIV  HARRP on Biktarvy.   CD4 in 07/2019 242  CD4 02/03/2020 185  Other Stroke Risk Factors  Advanced age  Migraines  Other Active Problems  Depression / anxiety  Hx right breast cancer on arimidex  Post IR groin bleeding. Sheath removed using quick clot and pressure held. Stable  CKD - stage 4  - creatinine - 0.86  Fever, TMax 101.8  Watery stools on miralax and colace. flexi-seal placed. Drugs stopped.  Low phos and mag - supplement   Hospital day # 5  Patient neurological condition as well as overall general medical condition remains quite poor with significant hemiplegia and left hemianopsia following her stroke despite mechanical thrombectomy and right carotid stent which occluded.  New diagnosis of adenocarcinoma likely from the lung even though she has history of breast carcinoma x2 in the past.  I had a long discussion the patient's son and daughter initially over the phone and also subsequently met with them in the afternoon when they arrived at the patient's bedside and explained her extremely poor prognosis and chances of survival and having meaningful quality of life being quite slim.  They agreed to DNR but needs more time to talk  to other family members to come and visit her and say goodbye before the transition to hospice and comfort care measures only hopefully in the next day or 2.  Discussed with Dr. Ruthann Cancer critical care.  Plan DNR and do not escalate care but continue ventilatory support for now Antony Contras, MD   Antony Contras, MD  To contact Stroke Continuity provider, please refer to [email or web address] After hours, contact General Neurology

## 2020-02-03 NOTE — Progress Notes (Signed)
OT Cancellation Note  Patient Details Name: Amber Lopez MRN: [id number] DOB: 1940-06-17   Cancelled Treatment:    Reason Eval/Treat Not Completed: Medical issues which prohibited therapy; family to meet today to determine best POC, may be transitioning to comfort. RN request to hold at this time. Will follow.   Lou Cal, OT Acute Rehabilitation Services Pager [phone] Office [phone]   Raymondo Band 02/03/2020, 11:11 AM

## 2020-02-03 NOTE — Progress Notes (Signed)
NAME:  Amber Lopez, MRN:  [id number], DOB:  March 25, 1941, LOS: 5 ADMISSION DATE:  01/29/2020, CONSULTATION DATE:  10/7 REFERRING MD: Dr. Estanislado Pandy, CHIEF COMPLAINT: Left Sided Weakness  Brief History   79 y/o F, with HIV, admitted 10/7 with LVO of R M1 s/p tPA, neuro IR revascularization.  Returned to ICU post procedure on mechanical ventilation. Additional finding of left superior sulcus mass and complex effusion.   History of present illness   79 y/o F who presented to [facility] on 10/7 with acute onset left sided weakness, facial droop, aphasia and right eye deviation. She was last known normal at 0640 when her daughter assisted her with am medications.  The patient was in bed but reportedly normal at that time.  Her daughter left and returned home finding her mother with weakness on the left side and unable to speak.  EMS was activated and CODE STROKE was activated in the field.  On arrival to the ER the patient was documented to have left sided hemiparesis, left facial droop, non-verbal and right gaze deviation. Initial CT of the head was negative for acute findings.  CTA head/neck demonstrated  Past Medical History  HIV/AIDS, HTN, HLD, DM, Depression , Arthritis, Breast Cancer   Significant Hospital Events   10/07 Admit with L hemiplegia, facial droop, aphasia with right eye deviation 10/11 Episode of worsened R eye deviation with tachycardia and HTN, concern for sz  Consults:  Neurology, IR   Procedures:  ETT 10/7 >> L Radial ALine 10/7 >>   R Femoral Sheath 10/7 >>   Significant Diagnostic Tests:  10/7 CT Head Code Stroke >> no acute finding, generalized atrophy  10/7 CTA Head/Neck >> emergent LVO at the right M1 segment, no visible embolic source, atherosclerosis without flow limiting stenosis or ulceration of major vessels, left superior sulcus mass with large complex pleural effusion, associated mediastinal adenopathy  10/7 CT ABD w/contrast >> Negative for hematoma 10/7 CT Chest  w/contrast >> Persistent large left pleural effusion with near complete left lung collapse. Left apical mass measuring 3.5 x 3.3 cm suspicious for bronchogenic carcinoma 10/11 CT head>>no acute findings 10/11 EEG>>Continuous slow, generalized and maximal right frontotemporal region  Micro Data:  COVID 10/7 >> negative  Influenza A/B 10/7 >> negative Pleural fluid culture>>no growth three days  Antimicrobials:   Interim history/subjective:  No overnight events  Objective   Blood pressure (!) 120/53, pulse (!) 101, temperature (!) 100.6 F (38.1 C), resp. rate (!) 36, height 4\' 10"  (1.473 m), weight 53.7 kg, SpO2 100 %.    Vent Mode: PRVC FiO2 (%):  [40 %] 40 % Set Rate:  [16 bmp-26 bmp] 16 bmp Vt Set:  [330 mL] 330 mL PEEP:  [5 cmH20] 5 cmH20 Plateau Pressure:  [13 cmH20-23 cmH20] 21 cmH20   Intake/Output Summary (Last 24 hours) at 02/03/2020 1101 Last data filed at 02/03/2020 1000 Gross per 24 hour  Intake 2740.83 ml  Output 1250 ml  Net 1490.83 ml   Filed Weights   01/29/20 0700  Weight: 53.7 kg    General:  Elderly, critically ill-appearing F, intubated HEENT: MM pink/moist, ETT in place Neuro: propofol held and pt remains unresponsive with R eye deviation, breathing over the vent with +gag and corneals CV: s1s2 rrr, no m/r/g PULM:  Coarse throughout, tachypneic, on full Vent support GI: soft, bsx4 active  Extremities: warm/dry, 1+ edema  Skin: no rashes or lesions    Resolved Hospital Problem list   AKI  Assessment &  Plan:   Acute, 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 Stable focal petechial hemorrhage, contrast stain in the right basal ganglia. MR with multi-focal infarct.  No acute findings after episode of worsened R eye dev.  Neurology following. TTE negative for vegetations or thrombosis.  Remains intubated as mental status precludes extubation -Pt unable to tolerate CPAP this morning -Management per  neurology, continue statin and Asa  -glycemic control -Goal SBP 120-160 --Maintain full vent support with SAT/SBT as tolerated -titrate Vent setting to maintain SpO2 greater than or equal to 90%. -HOB elevated 30 degrees. -Plateau pressures less than 30 cm H20.  -Follow chest x-ray, ABG prn.   -Bronchial hygiene and RT/bronchodilator protocol.    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 No evidence of distant metastatic disease, new finding this admission -No growth on culture, cytology positive for malignant cells consistent with malignant Adenocarcinoma -Very poor prognosis secondary to chronic illness and now significant CVA with poor neurologic recovery, family discussions ongoing regarding GOC   Fever and Hypotension AM of 10/11, may be neurogenic, though UA on admission with leuks and few bacteria. Repeat CXR without clear infiltrate -fever curve improved this AM, lactic acid WNL and off pressors today   History of HTN -Hypotensive, holding home meds   HIV/AIDS Last CD4 count 242 in 07/2019 - Restarted Descovy and [place] -check CD4  T2DM  - A1c of 6.5% -SSI  Nutrition -Start TF today  Hx of Breast Cancer  - Hold home anastrozole     Best practice:  Diet: NPO Pain/Anxiety/Delirium protocol (if indicated): Ordered VAP protocol (if indicated): In place  DVT prophylaxis: Lovenox GI prophylaxis: PPI  Glucose control: SSI Mobility: Bed Rest  Code Status: Full Code  Family Communication:  Pt's daughter and son updated  Labs   CBC: Recent Labs  Lab 01/29/20 0754 01/29/20 0759 01/30/20 0500 01/30/20 1021 01/30/20 1819 01/31/20 0512 02/01/20 1036 02/02/20 1011 02/03/20 0545  WBC 9.1   < > 10.6*   < > 8.6 10.0 14.1* 8.1 11.0*  NEUTROABS 7.1  --  8.7*  --   --   --   --   --   --   HGB 13.2   < > 8.2*   < > 10.2* 10.8* 11.3* 9.6* 8.7*  HCT 42.3   < >  24.8*   < > 31.3* 33.0* 35.5* 28.8* 27.5*  MCV 94.6   < > 92.5   < > 89.7 90.2 89.9 87.8 89.9  PLT 271   < > 261   < > 160 184 211 214 208   < > = values in this interval not displayed.    Basic Metabolic Panel: Recent Labs  Lab 01/30/20 0500 01/30/20 0500 01/30/20 1042 01/31/20 0512 02/01/20 1036 02/02/20 1011 02/03/20 0545  NA 141   < > 145 141 143 139 140  K 3.1*   < > 3.0* 3.8 4.8 3.6 3.4*  CL 112*  --   --  117* 116* 115* 114*  CO2 17*  --   --  14* 15* 17* 19*  GLUCOSE 140*  --   --  121* 143* 227* 207*  BUN 27*  --   --  21 15 15 15   CREATININE 1.55*  --   --  1.47* 1.31* 0.99 0.86  CALCIUM 7.4*  --   --  7.3*  8.0* 7.6* 7.0*  MG  --   --   --   --   --   --  1.4*  PHOS  --   --   --   --   --   --  1.2*   < > = values in this interval not displayed.   GFR: Estimated Creatinine Clearance: 38.5 mL/min (by C-G formula based on SCr of 0.86 mg/dL). Recent Labs  Lab 01/31/20 0512 02/01/20 1036 02/02/20 1011 02/02/20 2114 02/03/20 0545  WBC 10.0 14.1* 8.1  --  11.0*  LATICACIDVEN  --   --   --  1.6  --     Liver Function Tests: Recent Labs  Lab 01/29/20 0754  AST 21  ALT 12  ALKPHOS 80  BILITOT 1.0  PROT 7.7  ALBUMIN 2.8*   No results for input(s): LIPASE, AMYLASE in the last 168 hours. No results for input(s): AMMONIA in the last 168 hours.  ABG    Component Value Date/Time   PHART 7.419 01/30/2020 1042   PCO2ART 26.5 (L) 01/30/2020 1042   PO2ART 143 (H) 01/30/2020 1042   HCO3 17.3 (L) 01/30/2020 1042   TCO2 18 (L) 01/30/2020 1042   ACIDBASEDEF 7.0 (H) 01/30/2020 1042   O2SAT 99.0 01/30/2020 1042     Coagulation Profile: Recent Labs  Lab 01/29/20 0754 01/30/20 1021  INR 1.2 1.5*    Cardiac Enzymes: No results for input(s): CKTOTAL, CKMB, CKMBINDEX, TROPONINI in the last 168 hours.  HbA1C: Hgb A1c MFr Bld  Date/Time Value Ref Range Status  01/29/2020 04:22 PM 6.5 (H) 4.8 - 5.6 % Final    Comment:    (NOTE) Pre diabetes:           5.7%-6.4%  Diabetes:              >6.4%  Glycemic control for   <7.0% adults with diabetes     CBG: Recent Labs  Lab 02/02/20 1933 02/02/20 2317 02/03/20 0321 02/03/20 0739 02/03/20 0857  GLUCAP 157* 155* 154* 216* 212*    Review of Systems:   Unable to complete as patient is altered on mechanical ventilation.   Past Medical History  She,  has a past medical history of Arthritis, Depression, Diabetes mellitus without complication ([place]), Eating disorder, Hyperlipidemia, Hypertension, and Migraines.   Surgical History    Past Surgical History:  Procedure Laterality Date  . BREAST BIOPSY    . BREAST LUMPECTOMY Right    Years ago / Pt's daughter thinks it was cancer  . RADIOLOGY WITH ANESTHESIA N/A 01/29/2020   Procedure: IR WITH ANESTHESIA - CODE STROKE;  Surgeon: Radiologist, Medication, MD;  Location: [place];  Service: Radiology;  Laterality: N/A;     Social History   reports that she has never smoked. She has never used smokeless tobacco. She reports that she does not drink alcohol and does not use drugs.   Family History   Her family history includes Breast cancer (age of onset: 73) in her sister.   Allergies Allergies  Allergen Reactions  . Sulfa Antibiotics Swelling and Rash     Home Medications  Prior to Admission medications   Medication Sig Start Date End Date Taking? Authorizing Provider  amoxicillin-clavulanate (AUGMENTIN) 875-125 MG tablet Take 1 tablet by mouth 2 (two) times daily. 01/26/20   Billie Ruddy, MD  anastrozole (ARIMIDEX) 1 MG tablet Take 1 mg by mouth daily.    [provider]  BIKTARVY 50-200-25 MG TABS tablet TAKE 1  TABLET BY MOUTH DAILY 01/27/20   Golden Circle, FNP  hydrochlorothiazide (HYDRODIURIL) 25 MG tablet TAKE 1 TABLET(25 MG) BY MOUTH DAILY 01/26/20   Billie Ruddy, MD  MELATONIN PO Take 6 mg by mouth at bedtime.    [provider]  metoprolol succinate (TOPROL-XL) 25 MG 24 hr tablet Take 1 tablet (25 mg  total) by mouth daily. 12/31/19   Billie Ruddy, MD  potassium chloride SA (KLOR-CON) 20 MEQ tablet TAKE 1 TABLET(20 MEQ) BY MOUTH DAILY 01/26/20   Billie Ruddy, MD    CRITICAL CARE Performed by: Otilio Carpen Izick Gasbarro   Total critical care time: 35 minutes  Critical care time was exclusive of separately billable procedures and treating other patients.  Critical care was necessary to treat or prevent imminent or life-threatening deterioration.  Critical care was time spent personally by me on the following activities: development of treatment plan with patient and/or surrogate as well as nursing, discussions with consultants, evaluation of patient's response to treatment, examination of patient, obtaining history from patient or surrogate, ordering and performing treatments and interventions, ordering and review of laboratory studies, ordering and review of radiographic studies, pulse oximetry and re-evaluation of patient's condition.   Otilio Carpen Braeson Rupe, PA-C [facility]  Pager# [phone], if no answer [phone]

## 2020-02-04 DIAGNOSIS — J9601 Acute respiratory failure with hypoxia: Secondary | ICD-10-CM | POA: Diagnosis not present

## 2020-02-04 DIAGNOSIS — I6300 Cerebral infarction due to thrombosis of unspecified precerebral artery: Secondary | ICD-10-CM | POA: Diagnosis not present

## 2020-02-04 DIAGNOSIS — C801 Malignant (primary) neoplasm, unspecified: Secondary | ICD-10-CM

## 2020-02-04 LAB — MAGNESIUM: Magnesium: 1.7 mg/dL (ref 1.7–2.4)

## 2020-02-04 LAB — CBC
HCT: 27.9 % — ABNORMAL LOW (ref 36.0–46.0)
Hemoglobin: 8.8 g/dL — ABNORMAL LOW (ref 12.0–15.0)
MCH: 28.2 pg (ref 26.0–34.0)
MCHC: 31.5 g/dL (ref 30.0–36.0)
MCV: 89.4 fL (ref 80.0–100.0)
Platelets: 234 10*3/uL (ref 150–400)
RBC: 3.12 MIL/uL — ABNORMAL LOW (ref 3.87–5.11)
RDW: 15.4 % (ref 11.5–15.5)
WBC: 12.8 10*3/uL — ABNORMAL HIGH (ref 4.0–10.5)
nRBC: 0.2 % (ref 0.0–0.2)

## 2020-02-04 LAB — GLUCOSE, CAPILLARY
Glucose-Capillary: 172 mg/dL — ABNORMAL HIGH (ref 70–99)
Glucose-Capillary: 158 mg/dL — ABNORMAL HIGH (ref 70–99)
Glucose-Capillary: 171 mg/dL — ABNORMAL HIGH (ref 70–99)
Glucose-Capillary: 175 mg/dL — ABNORMAL HIGH (ref 70–99)
Glucose-Capillary: 190 mg/dL — ABNORMAL HIGH (ref 70–99)
Glucose-Capillary: 218 mg/dL — ABNORMAL HIGH (ref 70–99)

## 2020-02-04 LAB — BASIC METABOLIC PANEL
Anion gap: 8 (ref 5–15)
BUN: 16 mg/dL (ref 8–23)
CO2: 21 mmol/L — ABNORMAL LOW (ref 22–32)
Calcium: 7.3 mg/dL — ABNORMAL LOW (ref 8.9–10.3)
Chloride: 111 mmol/L (ref 98–111)
Creatinine, Ser: 0.86 mg/dL (ref 0.44–1.00)
GFR, Estimated: >60 mL/min (ref >60)
Glucose, Bld: 208 mg/dL — ABNORMAL HIGH (ref 70–99)
Potassium: 3.9 mmol/L (ref 3.5–5.1)
Sodium: 140 mmol/L (ref 135–145)

## 2020-02-04 LAB — PHOSPHORUS: Phosphorus: 2.5 mg/dL (ref 2.5–4.6)

## 2020-02-04 LAB — TRIGLYCERIDES: Triglycerides: 117 mg/dL (ref <150)

## 2020-02-04 MED ORDER — MAGNESIUM SULFATE 2 GM/50ML IV SOLN
2.0000 g | Freq: Once | INTRAVENOUS | Status: AC
  Administered 2020-02-04: 2 g via INTRAVENOUS
  Filled 2020-02-04: qty 50

## 2020-02-04 NOTE — Progress Notes (Signed)
Physical Therapy Treatment Patient Details Name: Amber Lopez MRN: [id number] DOB: [birth year] Today's Date: 02/04/2020    History of Present Illness 79 y.o. female with a PMHx of depression, HLD, HIV, HTN and DM presenting with acute onset of left hemiplegia, left facial droop, right eye deviation and aphasia. Pt received IV tPA and underwent R MCA revacularization on 01/29/2020. Pt also found to have large left pleural effusion with near complete L lung collapse and L apical mass. Chest tube placed on 01/31/2020.    PT Comments    Pt with much improved command following this session. Pt is alert for entire session and follows one step commands with RUE, RLE, and facial musculature. Pt does require significant physical assistance to perform bed mobility, but does spontaneously attempt to reposition body at the edge of bed while sitting. Pt also demonstrates some spontaneous movement of LLE during repositioning. Pt will benefit from continued acute PT POC to improve mobility quality and balance while reducing falls risk. PT continues to recommend SNF placement at this time.   Follow Up Recommendations  SNF     Equipment Recommendations  Wheelchair (measurements PT);Wheelchair cushion (measurements PT);Hospital bed (mechanical lift, all if home today)    Recommendations for Other Services       Precautions / Restrictions Precautions Precautions: Fall Precaution Comments: chest tube, ETT Restrictions Weight Bearing Restrictions: No    Mobility  Bed Mobility Overal bed mobility: Needs Assistance Bed Mobility: Supine to Sit;Sit to Supine     Supine to sit: Total assist;+2 for physical assistance;HOB elevated Sit to supine: Total assist;+2 for physical assistance;HOB elevated      Transfers                    Ambulation/Gait                 Stairs             Wheelchair Mobility    Modified Rankin (Stroke Patients Only) Modified Rankin (Stroke  Patients Only) Pre-Morbid Rankin Score: Slight disability Modified Rankin: Severe disability     Balance Overall balance assessment: Needs assistance Sitting-balance support: Single extremity supported;Feet unsupported Sitting balance-Leahy Scale: Zero Sitting balance - Comments: totalA with brief periods of minA, otherwise leaning back onto PT or with right lateral lean Postural control: Right lateral lean;Posterior lean                                  Cognition Arousal/Alertness: Awake/alert Behavior During Therapy: Flat affect Overall Cognitive Status: Impaired/Different from baseline Area of Impairment: Attention;Following commands;Problem solving                   Current Attention Level: Sustained   Following Commands: Follows one step commands with increased time (does not follow multi-step commands accurately)     Problem Solving: Slow processing;Requires verbal cues;Difficulty sequencing        Exercises      General Comments General comments (skin integrity, edema, etc.): pt on remains intubated on 40% FiO2, follows commands with RUE and RLE, spontaneous movement of LLE noted into knee extension when attempting to reposition herself at the edge of bed      Pertinent Vitals/Pain Pain Assessment: Faces Faces Pain Scale: No hurt    Home Living                      Prior  Function            PT Goals (current goals can now be found in the care plan section) Acute Rehab PT Goals Patient Stated Goal: To improve mobility quality and reduce caregiver burden Progress towards PT goals: Progressing toward goals    Frequency    Min 3X/week      PT Plan Current plan remains appropriate    Co-evaluation              AM-PAC PT "6 Clicks" Mobility   Outcome Measure  Help needed turning from your back to your side while in a flat bed without using bedrails?: Total Help needed moving from lying on your back to sitting on  the side of a flat bed without using bedrails?: Total Help needed moving to and from a bed to a chair (including a wheelchair)?: Total Help needed standing up from a chair using your arms (e.g., wheelchair or bedside chair)?: Total Help needed to walk in hospital room?: Total Help needed climbing 3-5 steps with a railing? : Total 6 Click Score: 6    End of Session Equipment Utilized During Treatment: Oxygen Activity Tolerance: Patient tolerated treatment well Patient left: in bed;with call bell/phone within reach;with bed alarm set Nurse Communication: Mobility status;Need for lift equipment PT Visit Diagnosis: Other abnormalities of gait and mobility (R26.89);Muscle weakness (generalized) (M62.81);Hemiplegia and hemiparesis;Other symptoms and signs involving the nervous system (R29.898) Hemiplegia - Right/Left: Left Hemiplegia - dominant/non-dominant: Non-dominant Hemiplegia - caused by: Cerebral infarction     Time: 1355-1426 PT Time Calculation (min) (ACUTE ONLY): 31 min  Charges:  $Therapeutic Activity: 8-22 mins                     Zenaida Niece, PT, DPT Acute Rehabilitation Pager: [phone]    Zenaida Niece 02/04/2020, 2:34 PM

## 2020-02-04 NOTE — Progress Notes (Signed)
Pt placed back on full ventilator support for night time rest. 

## 2020-02-04 NOTE — Consult Note (Signed)
Brief Oncology Note  S: Amber Lopez is a 79 year old female with medical history significant for HIV, HLD, DM type II who presented with left hemiplegia and facial droop who was found to have a R MCA stroke. She received tPA and underwent re-vascularization. During her workup she was found to have a large left malignant pleural effusion with left apical mass. Evaluation of this pleural fluid showed adenocarcinoma, consistent with lung primary.   Oncology has been consulted for discussions regarding [place]. I stopped by earlier today but the family was not present. I asked the nurse to notify me when family had returned. Received notification that they had returned around 7:00pm, but they declined to wait until 8:00pm for me to arrive. We will do our best to connect with the family.    Assessment/Plan: --patient is markedly deconditioned from her recent stroke and does not look to be a viable candidate for any treatments of her lung cancer. It is highly improbable she will recover to the point where she would be able to tolerate therapy.  --with a malignant pleural effusion, her cancer would qualify as Stage IV adenocarcinoma of the lung. The prognosis of this diagnosis is poor (even the absence of her other pressing medical issues) --encourage continued GOC and when family is ready please consult palliative care --we will try to connect with the patient's family tomorrow.  Please do not hesitate to call or page if there is any way we can assist in the care of this patient.  Ledell Peoples, MD Department of Hematology/Oncology [place] [facility] Phone: [phone] Pager: [phone] Email: [email or web address]

## 2020-02-04 NOTE — Progress Notes (Signed)
PHARMACY - PHYSICIAN COMMUNICATION CRITICAL VALUE ALERT - BLOOD CULTURE IDENTIFICATION (BCID)  Amber Lopez is an 79 y.o. female who presented to [facility] on 01/29/2020 with a chief complaint of stroke  Assessment:  Pt had been febrile but last temp was 98.8, no obvious infection  Name of physician (or Provider) Contacted: Dr. Leonel Ramsay  Current antibiotics: None  Changes to prescribed antibiotics recommended:  Cont to monitor off anti-biotics for now  Results for orders placed or performed during the hospital encounter of 01/29/20  Blood Culture ID Panel (Reflexed) (Collected: 02/02/2020 10:30 PM)  Result Value Ref Range   Enterococcus faecalis NOT DETECTED NOT DETECTED   Enterococcus Faecium NOT DETECTED NOT DETECTED   Listeria monocytogenes NOT DETECTED NOT DETECTED   Staphylococcus species DETECTED (A) NOT DETECTED   Staphylococcus aureus (BCID) NOT DETECTED NOT DETECTED   Staphylococcus epidermidis NOT DETECTED NOT DETECTED   Staphylococcus lugdunensis NOT DETECTED NOT DETECTED   Streptococcus species NOT DETECTED NOT DETECTED   Streptococcus agalactiae NOT DETECTED NOT DETECTED   Streptococcus pneumoniae NOT DETECTED NOT DETECTED   Streptococcus pyogenes NOT DETECTED NOT DETECTED   A.calcoaceticus-baumannii NOT DETECTED NOT DETECTED   Bacteroides fragilis NOT DETECTED NOT DETECTED   Enterobacterales NOT DETECTED NOT DETECTED   Enterobacter cloacae complex NOT DETECTED NOT DETECTED   Escherichia coli NOT DETECTED NOT DETECTED   Klebsiella aerogenes NOT DETECTED NOT DETECTED   Klebsiella oxytoca NOT DETECTED NOT DETECTED   Klebsiella pneumoniae NOT DETECTED NOT DETECTED   Proteus species NOT DETECTED NOT DETECTED   Salmonella species NOT DETECTED NOT DETECTED   Serratia marcescens NOT DETECTED NOT DETECTED   Haemophilus influenzae NOT DETECTED NOT DETECTED   Neisseria meningitidis NOT DETECTED NOT DETECTED   Pseudomonas aeruginosa NOT DETECTED NOT DETECTED    Stenotrophomonas maltophilia NOT DETECTED NOT DETECTED   Candida albicans NOT DETECTED NOT DETECTED   Candida auris NOT DETECTED NOT DETECTED   Candida glabrata NOT DETECTED NOT DETECTED   Candida krusei NOT DETECTED NOT DETECTED   Candida parapsilosis NOT DETECTED NOT DETECTED   Candida tropicalis NOT DETECTED NOT DETECTED   Cryptococcus neoformans/gattii NOT DETECTED NOT DETECTED    Narda Bonds 02/04/2020  6:02 AM

## 2020-02-04 NOTE — Progress Notes (Signed)
SLP Cancellation Note  Patient Details Name: AZYIAH BO MRN: [id number] DOB: February 27, 1941   Cancelled treatment:         Pt remains on vent.  Will f/u as able.  Darvis Croft L. Tivis Ringer, [place] CCC/SLP Acute Rehabilitation Services Office number [phone] Pager [phone]   Juan Quam Laurice 02/04/2020, 3:19 PM

## 2020-02-04 NOTE — Progress Notes (Signed)
Pharmacy Electrolyte Replacement  Recent Labs:  Recent Labs    02/04/20 0540  K 3.9  MG 1.7  PHOS 2.5  CREATININE 0.86    Low Critical Values (K </= 2.5, Phos </= 1, Mg </= 1) Present: None  Plan:  - Mg 1.7 - supplement with 2g IV x 1 - Repeat Mg with AM labs  Thank you for allowing pharmacy to be a part of this patient's care.  Alycia Rossetti, PharmD, BCPS Clinical Pharmacist Clinical phone for 02/04/2020: [phone] 02/04/2020 8:22 AM   **Pharmacist phone directory can now be found on [email or web address] ([email or web address]).  Listed under [place].

## 2020-02-04 NOTE — Progress Notes (Signed)
NAME:  Amber Lopez, MRN:  [id number], DOB:  1940/10/12, LOS: 6 ADMISSION DATE:  01/29/2020, CONSULTATION DATE:  10/7 REFERRING MD: Dr. Estanislado Pandy, CHIEF COMPLAINT: Left Sided Weakness  Brief History   79 y/o F, with HIV, admitted 10/7 with LVO of R M1 s/p tPA, neuro IR revascularization.  Returned to ICU post procedure on mechanical ventilation. Additional finding of left superior sulcus mass and complex effusion.   History of present illness   79 y/o F who presented to [facility] on 10/7 with acute onset left sided weakness, facial droop, aphasia and right eye deviation. She was last known normal at 0640 when her daughter assisted her with am medications.  The patient was in bed but reportedly normal at that time.  Her daughter left and returned home finding her mother with weakness on the left side and unable to speak.  EMS was activated and CODE STROKE was activated in the field.  On arrival to the ER the patient was documented to have left sided hemiparesis, left facial droop, non-verbal and right gaze deviation. Initial CT of the head was negative for acute findings.  CTA head/neck demonstrated  Past Medical History  HIV/AIDS, HTN, HLD, DM, Depression , Arthritis, Breast Cancer   Significant Hospital Events   10/07 Admit with L hemiplegia, facial droop, aphasia with right eye deviation 10/11 Episode of worsened R eye deviation with tachycardia and HTN, concern for sz  Consults:  Neurology, IR   Procedures:  ETT 10/7 >> L Radial ALine 10/7 >>   R Femoral Sheath 10/7 >>   Significant Diagnostic Tests:  10/7 CT Head Code Stroke >> no acute finding, generalized atrophy  10/7 CTA Head/Neck >> emergent LVO at the right M1 segment, no visible embolic source, atherosclerosis without flow limiting stenosis or ulceration of major vessels, left superior sulcus mass with large complex pleural effusion, associated mediastinal adenopathy  10/7 CT ABD w/contrast >> Negative for hematoma 10/7 CT Chest  w/contrast >> Persistent large left pleural effusion with near complete left lung collapse. Left apical mass measuring 3.5 x 3.3 cm suspicious for bronchogenic carcinoma 10/11 CT head>>no acute findings 10/11 EEG>>Continuous slow, generalized and maximal right frontotemporal region  Micro Data:  COVID 10/7 >> negative  Influenza A/B 10/7 >> negative Pleural fluid culture>>no growth three days 10/9 BCx2>> 1/2 with gram positives 10/7 MRSA screen>>negative  Antimicrobials:    Interim history/subjective:  No overnight events  Objective   Blood pressure (!) 145/60, pulse 97, temperature 99 F (37.2 C), resp. rate (!) 28, height 4\' 10"  (1.473 m), weight 53.7 kg, SpO2 100 %.    Vent Mode: PSV;CPAP FiO2 (%):  [40 %] 40 % Set Rate:  [16 bmp] 16 bmp Vt Set:  [330 mL] 330 mL PEEP:  [5 cmH20] 5 cmH20 Pressure Support:  [14 cmH20] 14 cmH20 Plateau Pressure:  [14 cmH20-18 cmH20] 18 cmH20   Intake/Output Summary (Last 24 hours) at 02/04/2020 0836 Last data filed at 02/04/2020 0600 Gross per 24 hour  Intake 2155.96 ml  Output 1440 ml  Net 715.96 ml   Filed Weights   01/29/20 0700  Weight: 53.7 kg   General:  Critically ill F, intubated  HEENT: MM pink/moist,. ETT in place Neuro: awake off sedation, not following commands, spontaneous movement of the R arm only, + gag, improved eye deviation today CV: s1s2 rrr, no m/r/g PULM:  L chest tube in place, weaning on CPAP 14/5, tachypneic GI: soft, bsx4 active  Extremities: warm/dry, 1+ edema  Skin: no rashes or lesions     Resolved Hospital Problem list   AKI  Assessment & Plan:   Acute, 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 Stable focal petechial hemorrhage, contrast stain in the right basal ganglia. MR with multi-focal infarct.  No acute findings after episode of worsened R eye dev.  Neurology following. TTE negative for vegetations or thrombosis.  Remains intubated as mental  status precludes extubation P: -Management per neurology, continue statin and Asa  -SAT with improved alertness today, but not following commands, able to tolerate CPAP for short amount of time before becoming tachypneic -glycemic control -Goal SBP 120-160 --Maintain full vent support with SAT/SBT as tolerated -titrate Vent setting to maintain SpO2 greater than or equal to 90%. -HOB elevated 30 degrees. -Plateau pressures less than 30 cm H20.  -Follow chest x-ray, ABG prn.   -Bronchial hygiene and RT/bronchodilator protocol. -Poor prognosis for neurologic recovery, family discussed with Neurology yesterday and pt now DNR, further discussion today regarding palliative care vs. Oncology consult    Large Left Superior Sulcus Mass with Complex Pleural Effusion  CT chest demonstrates 3.5 x 3.3 cm mass concerning for bronchogenic carcinoma. No evidence of distant metastatic disease, new finding this admission -cytology positive for malignant cells consistent with malignant Adenocarcinoma -Very poor prognosis secondary to chronic illness and now significant CVA with poor neurologic recovery, Pt DNR -Further discussion with family today re: GOC, may want formal oncology discussion   Fever and Hypotension Hypotension resolved, fever curve improving  - lactic acid WNL and off pressors today -BC 1/2 with gram positives, does not appear clinically septic, possible contaminant and await second culture finalization   History of HTN -Hypotensive, holding home meds   HIV/AIDS Last CD4 count 242 in 07/2019 -CD4 decreased to 185, pt compliant with treatment per family - Restarted Descovy and [place]   T2DM  - A1c of 6.5% -SSI  Nutrition -Start TF today  Hx of Breast Cancer  -initially diagnosed 2002 s/p lumpectomy and tamoxifen , recurrence 2010 with resection, XRT, Arimedex, last oncology note in 2018 from [facility] home anastrozole     Best practice:  Diet:  NPO Pain/Anxiety/Delirium protocol (if indicated): Ordered VAP protocol (if indicated): In place  DVT prophylaxis: Lovenox GI prophylaxis: PPI  Glucose control: SSI Mobility: Bed Rest  Code Status: Full Code  Family Communication:  Son coming to hospital today for bedside discussion  Labs   CBC: Recent Labs  Lab 01/29/20 0754 01/29/20 0759 01/30/20 0500 01/30/20 1021 01/31/20 0512 02/01/20 1036 02/02/20 1011 02/03/20 0545 02/04/20 0540  WBC 9.1   < > 10.6*   < > 10.0 14.1* 8.1 11.0* 12.8*  NEUTROABS 7.1  --  8.7*  --   --   --   --   --   --   HGB 13.2   < > 8.2*   < > 10.8* 11.3* 9.6* 8.7* 8.8*  HCT 42.3   < > 24.8*   < > 33.0* 35.5* 28.8* 27.5* 27.9*  MCV 94.6   < > 92.5   < > 90.2 89.9 87.8 89.9 89.4  PLT 271   < > 261   < > 184 211 214 208 234   < > = values in this interval not displayed.    Basic Metabolic Panel: Recent Labs  Lab 01/31/20 0512 02/01/20 1036 02/02/20 1011 02/03/20 0545 02/04/20 0540  NA 141 143 139 140 140  K 3.8 4.8 3.6 3.4* 3.9  CL 117* 116* 115* 114* 111  CO2 14* 15* 17* 19* 21*  GLUCOSE 121* 143* 227* 207* 208*  BUN 21 15 15 15 16   CREATININE 1.47* 1.31* 0.99 0.86 0.86  CALCIUM 7.3* 8.0* 7.6* 7.0* 7.3*  MG  --   --   --  1.4* 1.7  PHOS  --   --   --  1.2* 2.5   GFR: Estimated Creatinine Clearance: 38.5 mL/min (by C-G formula based on SCr of 0.86 mg/dL). Recent Labs  Lab 02/01/20 1036 02/02/20 1011 02/02/20 2114 02/03/20 0545 02/04/20 0540  WBC 14.1* 8.1  --  11.0* 12.8*  LATICACIDVEN  --   --  1.6  --   --     Liver Function Tests: Recent Labs  Lab 01/29/20 0754  AST 21  ALT 12  ALKPHOS 80  BILITOT 1.0  PROT 7.7  ALBUMIN 2.8*   No results for input(s): LIPASE, AMYLASE in the last 168 hours. No results for input(s): AMMONIA in the last 168 hours.  ABG    Component Value Date/Time   PHART 7.419 01/30/2020 1042   PCO2ART 26.5 (L) 01/30/2020 1042   PO2ART 143 (H) 01/30/2020 1042   HCO3 17.3 (L) 01/30/2020 1042    TCO2 18 (L) 01/30/2020 1042   ACIDBASEDEF 7.0 (H) 01/30/2020 1042   O2SAT 99.0 01/30/2020 1042     Coagulation Profile: Recent Labs  Lab 01/29/20 0754 01/30/20 1021  INR 1.2 1.5*    Cardiac Enzymes: No results for input(s): CKTOTAL, CKMB, CKMBINDEX, TROPONINI in the last 168 hours.  HbA1C: Hgb A1c MFr Bld  Date/Time Value Ref Range Status  01/29/2020 04:22 PM 6.5 (H) 4.8 - 5.6 % Final    Comment:    (NOTE) Pre diabetes:          5.7%-6.4%  Diabetes:              >6.4%  Glycemic control for   <7.0% adults with diabetes     CBG: Recent Labs  Lab 02/03/20 1539 02/03/20 1921 02/03/20 2316 02/04/20 0319 02/04/20 0807  GLUCAP 234* 163* 176* 172* 158*    Review of Systems:   Unable to complete as patient is altered on mechanical ventilation.   Past Medical History  She,  has a past medical history of Arthritis, Depression, Diabetes mellitus without complication ([place]), Eating disorder, Hyperlipidemia, Hypertension, and Migraines.   Surgical History    Past Surgical History:  Procedure Laterality Date  . BREAST BIOPSY    . BREAST LUMPECTOMY Right    Years ago / Pt's daughter thinks it was cancer  . IR CT HEAD LTD  01/29/2020  . IR PERCUTANEOUS ART THROMBECTOMY/INFUSION INTRACRANIAL INC DIAG ANGIO  01/29/2020  . RADIOLOGY WITH ANESTHESIA N/A 01/29/2020   Procedure: IR WITH ANESTHESIA - CODE STROKE;  Surgeon: Radiologist, Medication, MD;  Location: [facility];  Service: Radiology;  Laterality: N/A;     Social History   reports that she has never smoked. She has never used smokeless tobacco. She reports that she does not drink alcohol and does not use drugs.   Family History   Her family history includes Breast cancer (age of onset: 82) in her sister.   Allergies Allergies  Allergen Reactions  . Sulfa Antibiotics Swelling and Rash     Home Medications  Prior to Admission medications   Medication Sig Start Date End Date Taking? Authorizing Provider   amoxicillin-clavulanate (AUGMENTIN) 875-125 MG tablet Take 1 tablet  by mouth 2 (two) times daily. 01/26/20   Billie Ruddy, MD  anastrozole (ARIMIDEX) 1 MG tablet Take 1 mg by mouth daily.    [provider]  BIKTARVY 50-200-25 MG TABS tablet TAKE 1 TABLET BY MOUTH DAILY 01/27/20   Golden Circle, FNP  hydrochlorothiazide (HYDRODIURIL) 25 MG tablet TAKE 1 TABLET(25 MG) BY MOUTH DAILY 01/26/20   Billie Ruddy, MD  MELATONIN PO Take 6 mg by mouth at bedtime.    [provider]  metoprolol succinate (TOPROL-XL) 25 MG 24 hr tablet Take 1 tablet (25 mg total) by mouth daily. 12/31/19   Billie Ruddy, MD  potassium chloride SA (KLOR-CON) 20 MEQ tablet TAKE 1 TABLET(20 MEQ) BY MOUTH DAILY 01/26/20   Billie Ruddy, MD    CRITICAL CARE Performed by: Otilio Carpen Jabbar Palmero   Total critical care time: 40 minutes  Critical care time was exclusive of separately billable procedures and treating other patients.  Critical care was necessary to treat or prevent imminent or life-threatening deterioration.  Critical care was time spent personally by me on the following activities: development of treatment plan with patient and/or surrogate as well as nursing, discussions with consultants, evaluation of patient's response to treatment, examination of patient, obtaining history from patient or surrogate, ordering and performing treatments and interventions, ordering and review of laboratory studies, ordering and review of radiographic studies, pulse oximetry and re-evaluation of patient's condition.   Otilio Carpen Dandy Lazaro, PA-C [place] [facility]  Pager# [phone], if no answer [phone]

## 2020-02-04 NOTE — Progress Notes (Signed)
STROKE TEAM PROGRESS NOTE   INTERVAL HISTORY Patient remains intubated for respiratory failure and on ventilatory support.  She is more awake today but remains quite aphasic and is not following any commands.  She has persistent right gaze deviation and will not look to the left.  She has left hemiplegia and does not blink to threat on the left.  Patient's family has made a DNR but yet not ready for withdrawal of care.  I missed talking to son and daughter during rounds but they apparently spoke to critical care physician assistant.  Vital signs are stable. Attempt to wean her off ventilator support this morning was not successful. Vitals:   02/04/20 0400 02/04/20 0500 02/04/20 0600 02/04/20 0800  BP: (!) 129/58 (!) 120/59 (!) 144/60 (!) 145/60  Pulse: 83 75 80 97  Resp:    (!) 28  Temp: 98.6 F (37 C) 98.4 F (36.9 C) 98.8 F (37.1 C) 99 F (37.2 C)  TempSrc:      SpO2: 100% 100% 100% 100%  Weight:      Height:       CBC:  Recent Labs  Lab 01/29/20 0754 01/29/20 0759 01/30/20 0500 01/30/20 1021 02/03/20 0545 02/04/20 0540  WBC 9.1   < > 10.6*   < > 11.0* 12.8*  NEUTROABS 7.1  --  8.7*  --   --   --   HGB 13.2   < > 8.2*   < > 8.7* 8.8*  HCT 42.3   < > 24.8*   < > 27.5* 27.9*  MCV 94.6   < > 92.5   < > 89.9 89.4  PLT 271   < > 261   < > 208 234   < > = values in this interval not displayed.   Basic Metabolic Panel:  Recent Labs  Lab 02/03/20 0545 02/04/20 0540  NA 140 140  K 3.4* 3.9  CL 114* 111  CO2 19* 21*  GLUCOSE 207* 208*  BUN 15 16  CREATININE 0.86 0.86  CALCIUM 7.0* 7.3*  MG 1.4* 1.7  PHOS 1.2* 2.5    IMAGING past 24 hours No results found.  PHYSICAL EXAM     Temp:  [98.1 F (36.7 C)-100.9 F (38.3 C)] 99 F (37.2 C) (10/13 0800) Pulse Rate:  [73-118] 97 (10/13 0800) Resp:  [16-38] 28 (10/13 0800) BP: (93-160)/(46-77) 145/60 (10/13 0800) SpO2:  [99 %-100 %] 100 % (10/13 0800) FiO2 (%):  [40 %] 40 % (10/13 0800)  General - Well nourished,  well developed elderly African-American lady, intubated on ventilator  Ophthalmologic - fundi not visualized due to noncooperation.  Cardiovascular - Regular rate and rhythm.  Neuro - intubated  , eyes open.  Deviated to right.  Doll's eye movements are sluggish., not following commands.  Globally aphasic.   not blinking to visual threat bilaterally, doll's eyes sluggish, not tracking on the left visual field but seems able to track on the right, PERRL. Corneal reflex present, gag and cough present. Breathing over the vent.  Facial symmetry not able to test due to ET tube.  Tongue protrusion not cooperative. Spontaneous movement of RUE and RLE and localize to pain, RUE 3/5 at least and RLE 3-/5 at least. LUE and LLE not spontaneous movement, but mild withdraw to pain. DTR 1+ and no babinski. Sensation, coordination and gait not tested.   ASSESSMENT/PLAN Amber Lopez is a 79 y.o. female with history of depression, HLD, HIV, HTN and DM presenting with  left hemiplegia, left facial droop, right eye deviation and aphasia. Received IV tPA 01/29/2020 at 0816. -> IR 01/29/20  Stroke:  R MCA and small L cerebellar infarcts s/p tPA + IR R M1 occlusion w/ TICI2c revascularization, embolic secondary to unknown source possibly hypercoagulability from lung cancer  Code Stroke CT head No acute abnormality. Atrophy. ASPECTS 10.     CTA head & neck LVO R M1. Atherosclerosis. L superior sulcus mass w/ large pleural effusion malignant appearing w/ associated mediastinal adenopathy.  Cerebral angio / IR - R M1 occlusion s/p TICI2c revascularization using Tiger x 1, embotrap x 1, solitaire x 2.  Post IR CT - no ICH, contrast stain R basal ganglia  MRI  R MCA multifocal infarct w/ mild petechial hemorrhage. Small L cerebellar infarct. Atrophy,  CT Head repeat 01/31/20 - Expected evolution of right MCA nonhemorrhagic infarct. Stable focal subarachnoid hemorrhage near the right MCA bifurcation. Stable atrophy and  white matter disease on the left.  CT head 10/11 stable w/ expected evolution infarct w/ slight increase in edema. Stable R SAH. Same small vessel disease, atrophy.  2D Echo EF 55 to 60%, large pleural effusion on the left  LDL 62  HgbA1c 6.5  VTE prophylaxis - Lovenox 40 mg sq daily   No antithrombotic prior to admission, ASA 81 mg daily started 01/31/20  Therapy recommendations:  SNF  Disposition:  pending   Acute Respiratory Failure  Secondary to stroke  Intubated for IR, remains intubated  CXR w/ persistent large L pleural effusion w/ atx and consolidation  Ventilating well  Ok for extubation from stroke standpoint  CCM on board   Severe anemia   Acute blood loss anemia s/p IR, Hb 13.2->9.2->8.2->6.5 . . . 10.8->11.3->9.6->8.7->8.8   PRBC transfusion  CT abd/pelvis no retroperitoneal hemorrhage  Right groin oozing from sheath site - compressed - no active bleeding now  CBC monitoring  Hgb stabilizing  CCM on board  Large L lung mass  CTA neck - L superior sulcus mass w/ large pleural effusion malignant appearing w/ associated mediastinal adenopathy.  CT chest large left pleural effusion with near complete left lung collapse.  Left apical mass suspicious for bronchogenic carcinoma  CT abd/pelvis no distal metastasis disease  Lt chest tube placed 01/31/20 - Dr Thayer Jew  cytology of pleural fluid - Malignant cells consistent with metastatic adenocarcinoma   Family agreeable to DNR, do not escalate care  Per meeting with Dr. Leonie Man yesterday, Family not yet ready to pursue comfort care. They want to discuss with other family members. They may want oncologist to see. CCM to speak with them.   Dr. Leonie Man available to meet with them today when   Consider palliative care involvement today  Possible Seizure 10/11  Worsening R gaze forced deviation in setting of HTN, no shaking or jerking - 10/11 am   EEG continuous slowing  Repeat CT head 02/02/2020  slight increase in infarct now involving right caudate head as well but otherwise stable mass-effect on no midline shift  Hypotension/Hypertension  Home meds:  hctz 25, metoprolol 25  Off phenylephepdirine  . BP goal normotensive  Diabetes type II Controlled  Home meds:  None listed  HgbA1c 6.5, goal < 7.0  CBGs  SSI  DB RN following - I added TF coverage 2u q4h 10/11, d/c'd by CCM. Will defer to them.  Dysphagia At risk malnutrition . Secondary to stroke . NPO . On TF   HIV  HARRP on [employer].   CD4  in 07/2019 242  CD4 02/03/2020 185  Other Stroke Risk Factors  Advanced age  Migraines  Other Active Problems  Depression / anxiety  Hx right breast cancer on arimidex  Post IR groin bleeding. Sheath removed using quick clot and pressure held. Stable  CKD - stage 4  - creatinine - 0.86  Fever, TMax 100.9  Leukocytosis 11.0->12.8   Watery stools on miralax and colace. flexi-seal placed. Drugs stopped.  Low phos and [facility] day # 6 Patient's prognosis remains poor due to disabling large right hemispheric stroke as well as new diagnosis of metastatic adenocarcinoma with malignant pleural effusion.  Patient's family seems to be struggling with making decisions about her long-term care and need more time.  They have agreed to DNR.  They wish to speak to oncologist to get their opinion before making final decision.  Oncology consult is pending.  Discussed with Dr. Ruthann Cancer critical care medicine. This patient is critically ill and at significant risk of neurological worsening, death and care requires constant monitoring of vital signs, hemodynamics,respiratory and cardiac monitoring, extensive review of multiple databases, frequent neurological assessment, discussion with family, other specialists and medical decision making of high complexity.I have made any additions or clarifications directly to the above note.This critical care time does not  reflect procedure time, or teaching time or supervisory time of PA/NP/Med Resident etc but could involve care discussion time.  I spent 30 minutes of neurocritical care time  in the care of  this patient.    Antony Contras, MD  Antony Contras, MD  To contact Stroke Continuity provider, please refer to [email or web address] After hours, contact General Neurology

## 2020-02-04 NOTE — Progress Notes (Signed)
Pt allowed to have son & daughter visit until Friday, 2 at a time, per [employer].

## 2020-02-04 NOTE — Evaluation (Signed)
Occupational Therapy Evaluation Patient Details Name: Amber Lopez MRN: [id number] DOB: 01-Mar-1941 Today's Date: 02/04/2020    History of Present Illness 79 y.o. female with a PMHx of depression, HLD, HIV, HTN and DM presenting with acute onset of left hemiplegia, left facial droop, right eye deviation and aphasia. Pt received IV tPA and underwent R MCA revacularization on 01/29/2020. Pt also found to have large left pleural effusion with near complete L lung collapse and L apical mass. Chest tube placed on 01/31/2020.   Clinical Impression   Pt admitted with above. She demonstrates the below listed deficits and will benefit from continued OT to maximize safety and independence with BADLs.  Pt seen in conjunction with PT.  Pt is orally intubated.  She demonstrates decreased activity tolerance, Lt neglect with Rt gaze preference, Lt hemiplegia, impaired balance, as well as cognitive deficits.  She requires max A for simple grooming tasks, and max - total A for the remainder of ADLs and functional mobility.  Per chart, pt required intermittent assist with ADLs PTA.   Anticipate she will require post acute rehab - likely SNF vs. LTACH.  Will follow acutely.       Follow Up Recommendations  SNF    Equipment Recommendations  None recommended by OT    Recommendations for Other Services       Precautions / Restrictions Precautions Precautions: Fall Precaution Comments: chest tube, ETT Restrictions Weight Bearing Restrictions: No      Mobility Bed Mobility Overal bed mobility: Needs Assistance Bed Mobility: Supine to Sit;Sit to Supine     Supine to sit: Total assist;+2 for physical assistance;HOB elevated Sit to supine: Total assist;+2 for physical assistance;HOB elevated      Transfers                      Balance Overall balance assessment: Needs assistance Sitting-balance support: Single extremity supported;Feet unsupported Sitting balance-Leahy Scale: Zero Sitting  balance - Comments: totalA with brief periods of minA, otherwise leaning back onto PT or with right lateral lean Postural control: Right lateral lean;Posterior lean                                 ADL either performed or assessed with clinical judgement   ADL Overall ADL's : Needs assistance/impaired Eating/Feeding: NPO   Grooming: Wash/dry face;Maximal assistance;Sitting Grooming Details (indicate cue type and reason): hand over hand assist  Upper Body Bathing: Total assistance;Bed level;Sitting   Lower Body Bathing: Total assistance;Bed level   Upper Body Dressing : Total assistance;Bed level   Lower Body Dressing: Total assistance;Bed level   Toilet Transfer: Total assistance   Toileting- Clothing Manipulation and Hygiene: Total assistance;Bed level       Functional mobility during ADLs: +2 for physical assistance;Total assistance       Vision   Additional Comments: Pt demonstrates Rt gaze preference.  with max cues/stimuli she can briefly look to midline      Perception Perception Perception Tested?: Yes Perception Deficits: Inattention/neglect Inattention/Neglect: Does not attend to left visual field;Does not attend to left side of body Spatial deficits: Pt demonstrates Lt neglect with Rt gaze preference    Praxis Praxis Praxis tested?: Deficits Deficits: Perseveration Praxis-Other Comments: motor perseveration noted     Pertinent Vitals/Pain Pain Assessment: Faces Faces Pain Scale: No hurt     Hand Dominance Right   Extremity/Trunk Assessment Upper Extremity Assessment Upper Extremity Assessment:  LUE deficits/detail RUE Deficits / Details: No active movement noted Lt UE  LUE Deficits / Details: LUE is flaccid, PROM WFL LUE Coordination: decreased gross motor;decreased fine motor   Lower Extremity Assessment Lower Extremity Assessment: Defer to PT evaluation   Cervical / Trunk Assessment Cervical / Trunk Assessment: Other  exceptions Cervical / Trunk Exceptions: decreased trunk activation and control on Lt    Communication Communication Communication: Other (comment) (ETT )   Cognition Arousal/Alertness: Awake/alert Behavior During Therapy: Flat affect Overall Cognitive Status: Impaired/Different from baseline Area of Impairment: Attention;Following commands;Problem solving                   Current Attention Level: Sustained   Following Commands: Follows one step commands with increased time (does not follow multi-step commands accurately)     Problem Solving: Slow processing;Requires verbal cues;Difficulty sequencing General Comments: Pt follows simple motor commands with Rt UE and Rt LE on her Rt side.     General Comments  pt on 40% FiO2, follows commands with RUE and RLE, spontaneous movement of LLE noted into knee extension when attempting to reposition herself at the edge of bed    Exercises     Shoulder Instructions      Home Living Family/patient expects to be discharged to:: Private residence Living Arrangements: Children Available Help at Discharge: Family;Available 24 hours/day;Home health Type of Home: House Home Access: Level entry     Home Layout: One level     Bathroom Shower/Tub: Teacher, early years/pre: Standard     Home Equipment: Toilet riser;Cane - single point;Shower seat;Grab bars - tub/shower   Additional Comments: history obtained via chart review       Prior Functioning/Environment Level of Independence: Needs assistance  Gait / Transfers Assistance Needed: pt requires intermittent assistance to mobilize to bathroom per son, does not use an assistive device ADL's / Homemaking Assistance Needed: pt requires assistance for IADLs, meal prep, medicine management            OT Problem List: Decreased strength;Decreased range of motion;Decreased activity tolerance;Impaired balance (sitting and/or standing);Impaired  vision/perception;Decreased coordination;Decreased cognition;Decreased knowledge of use of DME or AE;Decreased safety awareness;Decreased knowledge of precautions;Cardiopulmonary status limiting activity;Impaired sensation;Impaired tone;Impaired UE functional use;Increased edema      OT Treatment/Interventions: Self-care/ADL training;Neuromuscular education;Energy conservation;DME and/or AE instruction;Manual therapy;Modalities;Therapeutic activities;Splinting;Cognitive remediation/compensation;Visual/perceptual remediation/compensation;Patient/family education;Balance training    OT Goals(Current goals can be found in the care plan section) Acute Rehab OT Goals Patient Stated Goal: Pt unable to state  OT Goal Formulation: With patient Time For Goal Achievement: 02/18/20 Potential to Achieve Goals: Good (for goals ) ADL Goals Pt Will Perform Grooming: with min assist;sitting Pt Will Perform Upper Body Bathing: with mod assist;sitting Pt Will Transfer to Toilet: with max assist;with +2 assist;bedside commode Additional ADL Goal #1: Pt will locate ADL items at midline with min cues Additional ADL Goal #2: Family will be independent with PROM/positioning Lt UE  OT Frequency: Min 2X/week   Barriers to D/C:            Co-evaluation PT/OT/SLP Co-Evaluation/Treatment: Yes Reason for Co-Treatment: Complexity of the patient's impairments (multi-system involvement);Necessary to address cognition/behavior during functional activity;For patient/therapist safety;To address functional/ADL transfers          AM-PAC OT "6 Clicks" Daily Activity     Outcome Measure Help from another person eating meals?: Total Help from another person taking care of personal grooming?: A Lot Help from another person toileting, which includes using toliet,  bedpan, or urinal?: Total Help from another person bathing (including washing, rinsing, drying)?: Total Help from another person to put on and taking off regular  upper body clothing?: Total Help from another person to put on and taking off regular lower body clothing?: Total 6 Click Score: 7   End of Session Equipment Utilized During Treatment: Oxygen Nurse Communication: Mobility status  Activity Tolerance: Patient tolerated treatment well Patient left: in bed;with call bell/phone within reach;with bed alarm set;with nursing/sitter in room  OT Visit Diagnosis: Hemiplegia and hemiparesis Hemiplegia - Right/Left: Left Hemiplegia - dominant/non-dominant: Non-Dominant Hemiplegia - caused by: Cerebral infarction                Time: 1438-8875 OT Time Calculation (min): 30 min Charges:  OT General Charges $OT Visit: 1 Visit OT Evaluation $OT Eval High Complexity: 1 High  Nilsa Nutting., OTR/L Acute Rehabilitation Services Pager [phone] Office [phone]   Lucille Passy M 02/04/2020, 4:28 PM

## 2020-02-04 NOTE — Progress Notes (Signed)
PCCM interval progress note:  Family discussion today with Pt's daughter [name] and son Alben Spittle, they express frustration with feeling rushed to make decisions regarding withdrawing/de-escalating care, especially since Pt is opening her eyes and is more awake today.  They understand that her prognosis is poor in the setting of large stroke with poor neurologic recovery and metastatic lung cancer and that patient is unlikely a candidate for treatment.   They tell me that they would consider Trach/PEG/LTACH potentially and would like to hear from Oncology. They would like to continue full support at this time and want patient FULL CODE.   They also mention wanting Pt transferred to [facility] or [facility] due to frustration with her care.    -We will continue full code and vent support as requested - consult Oncology, spoke with Dr. Lorenso Courier -think family would benefit from further meetings, Palliative care consult to help clarify GOC -Told family that acceptance at another facility would be very unlikely, we will wait on Oncology consult and re-visit this tomorrow

## 2020-02-05 ENCOUNTER — Encounter (HOSPITAL_COMMUNITY): Payer: Self-pay | Admitting: Neurology

## 2020-02-05 ENCOUNTER — Inpatient Hospital Stay (HOSPITAL_COMMUNITY): Payer: Medicare Other

## 2020-02-05 DIAGNOSIS — Z7189 Other specified counseling: Secondary | ICD-10-CM | POA: Diagnosis not present

## 2020-02-05 DIAGNOSIS — I6601 Occlusion and stenosis of right middle cerebral artery: Secondary | ICD-10-CM | POA: Diagnosis not present

## 2020-02-05 DIAGNOSIS — Z515 Encounter for palliative care: Secondary | ICD-10-CM | POA: Diagnosis not present

## 2020-02-05 DIAGNOSIS — C3492 Malignant neoplasm of unspecified part of left bronchus or lung: Secondary | ICD-10-CM | POA: Diagnosis not present

## 2020-02-05 DIAGNOSIS — I63511 Cerebral infarction due to unspecified occlusion or stenosis of right middle cerebral artery: Secondary | ICD-10-CM | POA: Diagnosis not present

## 2020-02-05 DIAGNOSIS — R9389 Abnormal findings on diagnostic imaging of other specified body structures: Secondary | ICD-10-CM

## 2020-02-05 LAB — CULTURE, BLOOD (ROUTINE X 2)

## 2020-02-05 LAB — BASIC METABOLIC PANEL
Anion gap: 9 (ref 5–15)
BUN: 14 mg/dL (ref 8–23)
CO2: 23 mmol/L (ref 22–32)
Calcium: 7.4 mg/dL — ABNORMAL LOW (ref 8.9–10.3)
Chloride: 107 mmol/L (ref 98–111)
Creatinine, Ser: 0.81 mg/dL (ref 0.44–1.00)
GFR, Estimated: >60 mL/min (ref >60)
Glucose, Bld: 215 mg/dL — ABNORMAL HIGH (ref 70–99)
Potassium: 3.9 mmol/L (ref 3.5–5.1)
Sodium: 139 mmol/L (ref 135–145)

## 2020-02-05 LAB — CBC
HCT: 27.4 % — ABNORMAL LOW (ref 36.0–46.0)
Hemoglobin: 8.8 g/dL — ABNORMAL LOW (ref 12.0–15.0)
MCH: 28.7 pg (ref 26.0–34.0)
MCHC: 32.1 g/dL (ref 30.0–36.0)
MCV: 89.3 fL (ref 80.0–100.0)
Platelets: 308 10*3/uL (ref 150–400)
RBC: 3.07 MIL/uL — ABNORMAL LOW (ref 3.87–5.11)
RDW: 15.2 % (ref 11.5–15.5)
WBC: 15.2 10*3/uL — ABNORMAL HIGH (ref 4.0–10.5)
nRBC: 0.4 % — ABNORMAL HIGH (ref 0.0–0.2)

## 2020-02-05 LAB — TRIGLYCERIDES: Triglycerides: 90 mg/dL (ref <150)

## 2020-02-05 LAB — GLUCOSE, CAPILLARY
Glucose-Capillary: 176 mg/dL — ABNORMAL HIGH (ref 70–99)
Glucose-Capillary: 176 mg/dL — ABNORMAL HIGH (ref 70–99)
Glucose-Capillary: 213 mg/dL — ABNORMAL HIGH (ref 70–99)
Glucose-Capillary: 167 mg/dL — ABNORMAL HIGH (ref 70–99)
Glucose-Capillary: 166 mg/dL — ABNORMAL HIGH (ref 70–99)
Glucose-Capillary: 155 mg/dL — ABNORMAL HIGH (ref 70–99)

## 2020-02-05 LAB — MAGNESIUM: Magnesium: 1.9 mg/dL (ref 1.7–2.4)

## 2020-02-05 MED ORDER — INSULIN DETEMIR 100 UNIT/ML ~~LOC~~ SOLN
10.0000 [IU] | Freq: Two times a day (BID) | SUBCUTANEOUS | Status: DC
  Administered 2020-02-05 – 2020-02-13 (×17): 10 [IU] via SUBCUTANEOUS
  Filled 2020-02-05 (×20): qty 0.1

## 2020-02-05 MED ORDER — VANCOMYCIN HCL 500 MG/100ML IV SOLN
500.0000 mg | Freq: Two times a day (BID) | INTRAVENOUS | Status: DC
  Administered 2020-02-06 – 2020-02-07 (×3): 500 mg via INTRAVENOUS
  Filled 2020-02-05 (×4): qty 100

## 2020-02-05 MED ORDER — PROSOURCE TF PO LIQD
45.0000 mL | Freq: Two times a day (BID) | ORAL | Status: DC
  Administered 2020-02-05 – 2020-02-19 (×28): 45 mL
  Filled 2020-02-05 (×28): qty 45

## 2020-02-05 MED ORDER — PANTOPRAZOLE SODIUM 40 MG PO PACK
40.0000 mg | PACK | Freq: Every day | ORAL | Status: DC
  Administered 2020-02-05 – 2020-03-21 (×46): 40 mg
  Filled 2020-02-05 (×45): qty 20

## 2020-02-05 MED ORDER — ATOVAQUONE 750 MG/5ML PO SUSP
1500.0000 mg | Freq: Every day | ORAL | Status: DC
  Administered 2020-02-05 – 2020-03-03 (×28): 1500 mg
  Filled 2020-02-05 (×29): qty 10

## 2020-02-05 MED ORDER — OSMOLITE 1.2 CAL PO LIQD
1000.0000 mL | ORAL | Status: DC
  Administered 2020-02-06 – 2020-02-19 (×12): 1000 mL
  Filled 2020-02-05 (×4): qty 1000

## 2020-02-05 MED ORDER — SODIUM CHLORIDE 0.9 % IV SOLN
2.0000 g | Freq: Two times a day (BID) | INTRAVENOUS | Status: AC
  Administered 2020-02-05 – 2020-02-11 (×14): 2 g via INTRAVENOUS
  Filled 2020-02-05 (×15): qty 2

## 2020-02-05 MED ORDER — VANCOMYCIN HCL IN DEXTROSE 1-5 GM/200ML-% IV SOLN
1000.0000 mg | Freq: Once | INTRAVENOUS | Status: AC
  Administered 2020-02-05: 1000 mg via INTRAVENOUS
  Filled 2020-02-05: qty 200

## 2020-02-05 MED ORDER — PROSOURCE TF PO LIQD: 45.0000 mL | Freq: Every day | ORAL | Status: DC

## 2020-02-05 NOTE — Progress Notes (Signed)
STROKE TEAM PROGRESS NOTE   INTERVAL HISTORY Patient is neurological condition remains unchanged.  She is awake but remains aphasic and does not follow commands.  She has persistent right gaze deviation and dense left hemiplegia and purposeful movements in the right.  She remains febrile and with respiratory failure requiring ventilatory support and unable to wean.  Her white count is elevated and she has copious secretions and antibiotics have been changed to vancomycin and cefepime.  Family apparently met with critical care physician assistant yesterday and expressed frustration change patient's status back to full code and even requested possible transfer to [facility] on another facility.  They however have agreed to meet with palliative care team later this morning. Oncology consult done but apparently oncologist has not met with family yet. Vitals:   02/05/20 0500 02/05/20 0600 02/05/20 0700 02/05/20 0800  BP: (!) 146/58 (!) 160/71 (!) 141/64 (!) 161/65  Pulse: 100 (!) 106 95 (!) 117  Resp: (!) 26 (!) 30 (!) 25 (!) 35  Temp: 99.1 F (37.3 C) (!) 100.8 F (38.2 C) (!) 100.9 F (38.3 C) (!) 101.7 F (38.7 C)  TempSrc:    Esophageal  SpO2: 99% 100% 100% 100%  Weight:      Height:       CBC:  Recent Labs  Lab 01/30/20 0500 01/30/20 1021 02/03/20 0545 02/04/20 0540  WBC 10.6*   < > 11.0* 12.8*  NEUTROABS 8.7*  --   --   --   HGB 8.2*   < > 8.7* 8.8*  HCT 24.8*   < > 27.5* 27.9*  MCV 92.5   < > 89.9 89.4  PLT 261   < > 208 234   < > = values in this interval not displayed.   Basic Metabolic Panel:  Recent Labs  Lab 02/03/20 0545 02/03/20 0545 02/04/20 0540 02/05/20 0453  NA 140  --  140  --   K 3.4*  --  3.9  --   CL 114*  --  111  --   CO2 19*  --  21*  --   GLUCOSE 207*  --  208*  --   BUN 15  --  16  --   CREATININE 0.86  --  0.86  --   CALCIUM 7.0*  --  7.3*  --   MG 1.4*   < > 1.7 1.9  PHOS 1.2*  --  2.5  --    < > = values in this interval not displayed.     IMAGING past 24 hours No results found.  PHYSICAL EXAM     Temp:  [97.3 F (36.3 C)-101.7 F (38.7 C)] 101.7 F (38.7 C) (10/14 0800) Pulse Rate:  [80-117] 117 (10/14 0800) Resp:  [22-38] 35 (10/14 0800) BP: (124-161)/(43-102) 161/65 (10/14 0800) SpO2:  [98 %-100 %] 100 % (10/14 0800) FiO2 (%):  [40 %] 40 % (10/14 0800)  General - Well nourished, well developed elderly African-American lady, intubated on ventilator  Ophthalmologic - fundi not visualized due to noncooperation.  Cardiovascular - Regular rate and rhythm.  Neuro - intubated  , eyes open.  Deviated to right.  Doll's eye movements are sluggish., not following commands.  Globally aphasic.   not blinking to visual threat bilaterally, doll's eyes sluggish, not tracking on the left visual field but seems able to track on the right, PERRL. Corneal reflex present, gag and cough present. Breathing over the vent.  Facial symmetry not able to test due to ET  tube.  Tongue protrusion not cooperative. Spontaneous movement of RUE and RLE and localize to pain, RUE 3/5 at least and RLE 3-/5 at least. LUE and LLE not spontaneous movement, but mild withdraw to pain. DTR 1+ and no babinski. Sensation, coordination and gait not tested.   ASSESSMENT/PLAN Ms. TERRILYNN POSTELL is a 79 y.o. female with history of depression, HLD, HIV, HTN and DM presenting with left hemiplegia, left facial droop, right eye deviation and aphasia. Received IV tPA 01/29/2020 at 0816. -> IR 01/29/20  Stroke:  R MCA and small L cerebellar infarcts s/p tPA + IR R M1 occlusion w/ TICI2c revascularization, embolic secondary to unknown source possibly hypercoagulability from lung cancer  Code Stroke CT head No acute abnormality. Atrophy. ASPECTS 10.     CTA head & neck LVO R M1. Atherosclerosis. L superior sulcus mass w/ large pleural effusion malignant appearing w/ associated mediastinal adenopathy.  Cerebral angio / IR - R M1 occlusion s/p TICI2c revascularization  using Tiger x 1, embotrap x 1, solitaire x 2.  Post IR CT - no ICH, contrast stain R basal ganglia  MRI  R MCA multifocal infarct w/ mild petechial hemorrhage. Small L cerebellar infarct. Atrophy,  CT Head repeat 01/31/20 - Expected evolution of right MCA nonhemorrhagic infarct. Stable focal subarachnoid hemorrhage near the right MCA bifurcation. Stable atrophy and white matter disease on the left.  CT head 10/11 stable w/ expected evolution infarct w/ slight increase in edema. Stable R SAH. Same small vessel disease, atrophy.  2D Echo EF 55 to 60%, large pleural effusion on the left  LDL 62  HgbA1c 6.5  VTE prophylaxis - Lovenox 40 mg sq daily   No antithrombotic prior to admission, ASA 81 mg daily started 01/31/20  Therapy recommendations:  SNF  Disposition:  pending   Acute Respiratory Failure  Secondary to stroke  Intubated for IR, remains intubated w/ full support  Daily SBT trials  CXR new ill-defined opacity right upper lobe raising concern for developing pneumonia.  Small left pleural effusion with left base atelectasis and suspect neoplasm in the left medial apex.  Minimize sedation  Not a candidate for long-term trach   CCM on board   Fever  TMax 101.7  Leukocytosis 11.0->12.8->15.2  Increasing secretions  Blood Cx from  101/11 +Staph   CXR 02/05/20 new ill-defined opacity right upper lobe raising concern for developing pneumonia.  Small left pleural effusion with left base atelectasis and suspect neoplasm in the left medial apex.   Vanc/Cefepime 10/14>>    Severe anemia   Acute blood loss anemia s/p IR, Hb 13.2->9.2->8.2->6.5 . . . 10.8->11.3->9.6->8.7->8.8->8.8   PRBC transfusion  CT abd/pelvis no retroperitoneal hemorrhage  Right groin oozing from sheath site - compressed - no active bleeding   Hgb stabilizing  CCM on board  Adenocarcinoma of lung w/ L pleural effusion  CTA neck - L superior sulcus mass w/ large pleural effusion malignant  appearing w/ associated mediastinal adenopathy.  CT chest large left pleural effusion with near complete left lung collapse.  Left apical mass suspicious for bronchogenic carcinoma  CT abd/pelvis no distal metastasis disease  Lt chest tube placed 01/31/20 - Dr Thayer Jew  cytology of pleural fluid - Malignant cells consistent with metastatic adenocarcinoma   Family not yet ready to pursue comfort care.   DNR changed to FULL CODE at family request  Oncology consult - stage IV adenocarcinoma of the lung, prognosis poor, not a candidate for aggressive tx  Palliative care consult  in place   Possible Seizure 10/11  Worsening R gaze forced deviation in setting of HTN, no shaking or jerking - 10/11 am   EEG continuous slowing  Repeat CT head 02/02/2020 slight increase in infarct now involving right caudate head as well but otherwise stable mass-effect on no midline shift  Hypotension/Hypertension  Home meds:  hctz 25, metoprolol 25  Off phenylephepdirine  . BP goal normotensive  Diabetes type II Controlled  Home meds:  None listed  HgbA1c 6.5, goal < 7.0  CBGs  SSI  On levimir 10  DB RN following - I added TF coverage 2u q4h 10/11, d/c'd by CCM. Will defer to them.  Dysphagia At risk malnutrition . Secondary to stroke . NPO . On TF   HIV  HARRP on Biktarvy.   CD4 in 07/2019 242  CD4 02/03/2020 185  Other Stroke Risk Factors  Advanced age  Migraines  Other Active Problems  Depression / anxiety  Hx right breast cancer 200 s/p lumpectomy and tamoxifen, recurred 2010 w/ resection, XRT, on arimidex PTA  Post IR groin bleeding. Sheath removed using quick clot and pressure held. Stable  CKD - stage 4  - creatinine - 0.81  Watery stools on miralax and colace. flexi-seal placed. Meds stopped.    Low phos and [facility] day # 7 Patient neurological status exam remained stable but overall condition remains poor given respiratory failure,  recent diagnosis of metastatic adenocarcinoma stage IV and now superimposed sepsis.  She is unlikely to survive without prolonged life support, ventilatory support nursing home stay requiring 24-hour nursing care with poor quality of life.  Palliative care team meeting with family later this morning to discuss goals of care.  I am available to talk to them if they have any neurological questions.  Discussed with Dr. Ruthann Cancer critical care medicine and patient's RN.This patient is critically ill and at significant risk of neurological worsening, death and care requires constant monitoring of vital signs, hemodynamics,respiratory and cardiac monitoring, extensive review of multiple databases, frequent neurological assessment, discussion with family, other specialists and medical decision making of high complexity.I have made any additions or clarifications directly to the above note.This critical care time does not reflect procedure time, or teaching time or supervisory time of PA/NP/Med Resident etc but could involve care discussion time.  I spent 30 minutes of neurocritical care time  in the care of  this patient.    Antony Contras, MD  To contact Stroke Continuity provider, please refer to [email or web address] After hours, contact General Neurology

## 2020-02-05 NOTE — Consult Note (Signed)
Consultation Note Date: 02/05/2020   [name]  DOB: 01-13-1941  MRN: [id number]  Age / Sex: 79 y.o., female  PCP: Billie Ruddy, MD Referring Physician: Garvin Fila, MD  Reason for Consultation: Establishing goals of care and Psychosocial/spiritual support  HPI/Patient Profile: 79 y.o. female  admitted on 01/29/2020 with a  PMH of depression, HLD, HIV, HTN and DM, dementia, presenting with acute onset of left hemiplegia, left facial droop, right eye deviation and aphasia.   On arrival to the ED the patient was hemiplegic on the left with left facial droop, right gaze deviation and nonverbal.   Admitted for treatment and stabilization.   IMPRESSION:   Head Ct 02-02-20 1. Expected evolution of a right MCA territory infarct with slightly increased edema associated with the caudate head and right frontal white matter. No progressive mass effect. 2. Similar focal subarachnoid hemorrhage near the right MCA bifurcation. No evidence of new hemorrhage. 3. Similar chronic microvascular ischemic disease and atrophy  Currently ventilated/rsp failure,  immunocompromised state, new hcap, new dx of stage 4 adenocarcinoma.  Long term prognosis is poor.   Family face treatment option decisions, advanced directive decisions and anticipatory care needs.      Clinical Assessment and Goals of Care:   This NP Wadie Lessen reviewed medical records, received report from team, assessed the patient and then meet at the patient's bedside along with her son/"Amber Lopez" and daughter/ Chrsitel to discuss diagnosis, prognosis, GOC, EOL wishes disposition and options.   Concept of Palliative Care was introduced as specialized medical care for people and their families living with serious illness.  It focuses on providing relief from the symptoms and stress of a serious illness.  The goal is to improve quality of  life for both the patient and the family.  A  discussion was had today regarding advanced directives.  Concepts specific to code status, artifical feeding and hydration, continued IV antibiotics and rehospitalization was had.  The difference between a aggressive medical intervention path  and a palliative comfort care path for this patient at this time was had.  Values and goals of care important to patient and family were attempted to be elicited.   Created space and opportunity for family to explore their thoughts and feelings regarding current medical situation.  They verbalize their goal is to ensure that their mother has been given every possible chance and the "very best of care."  There was some discussion about desire for second opinion at a tertiary hospital but on further exploration both son and daughter understand the seriousness of the patient's current medical situation and ultimately their hope is that their mother does not suffer, and they are comfortable with current medical care, here at [facility]    They understand the limited prognosis regardless of continued aggressive interventions.  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   They are currently preparing for her celebration of life.  They hope to have family visit over the next few days and re-address liberation from the vent on Tuesday the 19th at 330.     MOST form introduced.    Plan of care: -Continue current level of care through Tuesday October 19th at 3:30, giving family time to visit and put things in order.  I will meet with them for a probable liberation from the vent at that time. -Limited code-no compressions  - no desire for Trach or PEG now or in the future -have family visit for closure prior to extubation -chaplain support     Questions and concerns addressed.  Patient  encouraged to call with questions or concerns.     PMT will continue to support holistically.    Son/ Amber Lopez "Amber Lopez" is HPOA.  He and his only sister Amber Lopez work closely together for patient's best nterest    SUMMARY OF RECOMMENDATIONS    Code Status/Advance Care Planning:  Limited code- continue current level of care, no compressions   Psycho-social/Spiritual:   Desire for further Chaplaincy support:yes  Additional Recommendations: Grief/Bereavement Support  Prognosis:   Family prepared that anything could happen at any time.  However once extubated from the ventilatory prognosis is likely days  Discharge Planning: Anticipated Hospital Death      Primary Diagnoses: Present on Admission: . Middle cerebral artery embolism, right   I have reviewed the medical record, interviewed the patient and family, and examined the patient. The following aspects are pertinent.  Past Medical History:  Diagnosis Date  . Arthritis   . Depression   . Diabetes mellitus without complication ([place])   . Eating disorder   . Hyperlipidemia   . Hypertension   . Migraines    Social History   Socioeconomic History  . Marital status: Single    Spouse name: Not on file  . Number of children: Not on file  . Years of education: Not on file  . Highest education level: Not on file  Occupational History  . Not on file  Tobacco Use  . Smoking status: Never Smoker  . Smokeless tobacco: Never Used  Vaping Use  . Vaping Use: Never used  Substance and Sexual Activity  . Alcohol use: Never  . Drug use: Never  . Sexual activity: Not on file  Other Topics Concern  . Not on file  Social History Narrative  . Not on file   Social Determinants of Health   Financial Resource Strain:   . Difficulty of Paying Living Expenses: Not on file  Food Insecurity:   . Worried About Charity fundraiser in the Last Year: Not on file  . Ran Out  of Food in the Last Year: Not on file  Transportation Needs:   . Lack of Transportation (Medical): Not on file  . Lack of Transportation (Non-Medical): Not on file  Physical Activity:   . Days of Exercise per Week: Not on file  . Minutes of Exercise per Session: Not on file  Stress:   . Feeling of Stress : Not on file  Social Connections:   . Frequency of Communication with Friends and Family: Not on file  . Frequency of Social Gatherings with Friends and Family: Not on file  . Attends Religious Services: Not on file  . Active Member of Clubs or Organizations: Not on file  . Attends Archivist  Meetings: Not on file  . Marital Status: Not on file   Family History  Problem Relation Age of Onset  . Breast cancer Sister 27   Scheduled Meds: . aspirin  81 mg Per Tube Daily  . chlorhexidine gluconate (MEDLINE KIT)  15 mL Mouth Rinse BID  . Chlorhexidine Gluconate Cloth  6 each Topical Daily  . docusate  100 mg Per Tube BID  . dolutegravir  50 mg Oral Daily  . emtricitabine-tenofovir AF  1 tablet Per Tube Daily  . enoxaparin (LOVENOX) injection  40 mg Subcutaneous Q24H  . feeding supplement (PROSource TF)  45 mL Per Tube TID  . influenza vaccine adjuvanted  0.5 mL Intramuscular Tomorrow-1000  . insulin aspart  0-15 Units Subcutaneous Q4H  . mouth rinse  15 mL Mouth Rinse 10 times per day  . pantoprazole (PROTONIX) IV  40 mg Intravenous QHS  . polyethylene glycol  17 g Per Tube Daily  . rosuvastatin  10 mg Per NG tube Daily  . sodium chloride flush  10-40 mL Intracatheter Q12H   Continuous Infusions: . sodium chloride    . sodium chloride 25 mL/hr at 02/05/20 0900  . sodium chloride    . feeding supplement (OSMOLITE 1.2 CAL) 40 mL/hr at 02/05/20 0600  . niCARDipine    . phenylephrine (NEO-SYNEPHRINE) Adult infusion Stopped (02/02/20 2254)  . propofol (DIPRIVAN) infusion Stopped (02/02/20 1302)   PRN Meds:.Place/Maintain arterial line **AND** sodium chloride,  acetaminophen **OR** acetaminophen (TYLENOL) oral liquid 160 mg/5 mL **OR** acetaminophen, fentaNYL (SUBLIMAZE) injection, labetalol, senna-docusate, sodium chloride flush Medications Prior to Admission:  Prior to Admission medications   Medication Sig Start Date End Date Taking? Authorizing Provider  amoxicillin-clavulanate (AUGMENTIN) 875-125 MG tablet Take 1 tablet by mouth 2 (two) times daily. 01/26/20  Yes Billie Ruddy, MD  anastrozole (ARIMIDEX) 1 MG tablet Take 1 mg by mouth daily.   Yes [provider]  BIKTARVY 50-200-25 MG TABS tablet TAKE 1 TABLET BY MOUTH DAILY 01/27/20  Yes Golden Circle, FNP  hydrochlorothiazide (HYDRODIURIL) 25 MG tablet Take 25 mg by mouth daily.   Yes [provider]  MELATONIN PO Take 6 mg by mouth daily as needed (For sleep).    Yes [provider]  metoprolol succinate (TOPROL-XL) 25 MG 24 hr tablet Take 1 tablet (25 mg total) by mouth daily. 12/31/19  Yes Billie Ruddy, MD  potassium chloride SA (KLOR-CON) 20 MEQ tablet TAKE 1 TABLET(20 MEQ) BY MOUTH DAILY 01/26/20  Yes Billie Ruddy, MD  hydrochlorothiazide (HYDRODIURIL) 25 MG tablet TAKE 1 TABLET(25 MG) BY MOUTH DAILY Patient not taking: Reported on 01/29/2020 01/26/20   Billie Ruddy, MD   Allergies  Allergen Reactions  . Sulfa Antibiotics Swelling and Rash   Review of Systems  Unable to perform ROS: Intubated    Physical Exam Constitutional:      Appearance: She is cachectic. She is ill-appearing.     Interventions: She is intubated.  Cardiovascular:     Rate and Rhythm: Tachycardia present.  Pulmonary:     Effort: She is intubated.  Skin:    General: Skin is warm and dry.     Vital Signs: BP (!) 127/51   Pulse (!) 111   Temp 99.9 F (37.7 C)   Resp (!) 31   Ht 4' 10"  (1.473 m)   Wt 53.7 kg   SpO2 100%   BMI 24.74 kg/m  Pain Scale: CPOT       SpO2: SpO2:  100 % O2 Device:SpO2: 100 % O2 Flow Rate: .O2 Flow Rate (L/min): 0 L/min  IO:  Intake/output summary:   Intake/Output Summary (Last 24 hours) at 02/05/2020 1100 Last data filed at 02/05/2020 0900 Gross per 24 hour  Intake 1467.75 ml  Output 1750 ml  Net -282.25 ml    LBM: Last BM Date: 02/05/20 Baseline Weight: Weight: 53.7 kg Most recent weight: Weight: 53.7 kg     Palliative Assessment/Data:   Discussed with Dr Leonie Man and Dr Ruthann Cancer via secure chat  Time In: 1100 Time Out: 1230 Time Total: 90 minutes Greater than 50%  of this time was spent counseling and coordinating care related to the above assessment and plan.  Signed by: Wadie Lessen, NP   Please contact Palliative Medicine Team phone at [phone] for questions and concerns.  For individual provider: See Shea Evans

## 2020-02-05 NOTE — Progress Notes (Signed)
Attempted to call family vm full or unidentified. No answer at any number provided.   Will await their call or return to hospital for update

## 2020-02-05 NOTE — Progress Notes (Signed)
PHARMACIST - PHYSICIAN COMMUNICATION  DR:   Leonie Man  CONCERNING: IV to Oral Route Change Policy  RECOMMENDATION: This patient is receiving protonix by the intravenous route.  Based on criteria approved by the Pharmacy and Therapeutics Committee, the intravenous medication(s) is/are being converted to the equivalent oral dose form(s).   DESCRIPTION: These criteria include:  The patient is eating (either orally or via tube) and/or has been taking other orally administered medications for a least 24 hours  The patient has no evidence of active gastrointestinal bleeding or impaired GI absorption (gastrectomy, short bowel, patient on TNA or NPO).  If you have questions about this conversion, please contact the Pharmacy Department  []   [phone] )  [facility] []   [phone] )  [facility] [x]   [phone] )  [facility] []   [phone] )  [facility] []   [phone] )  [place] 02/05/2020 4:48 PM

## 2020-02-05 NOTE — Progress Notes (Signed)
Brief oncology note:  Went to the patient's room to attempt to meet with family to discuss diagnosis and prognosis from oncology standpoint.  No family was at the bedside.  PCCM a.m. palliative care notes have been reviewed.  Also discussed with nursing who indicates that family has opted not to escalate care.  Following my visit, I received a request from the palliative care NP asking [place] to reach out to the patient's son.  I will contact him by phone tomorrow to see if we can arrange for a face-to-face meeting tomorrow afternoon in the patient's room.  Dr. Lorenso Courier will be available after 12:00 tomorrow to meet with the patient's family.  Recommendations:  --patient is markedly deconditioned from her recent stroke and does not look to be a viable candidate for any treatments of her lung cancer. It is highly improbable she will recover to the point where she would be able to tolerate therapy.  --with a malignant pleural effusion, her cancer would qualify as Stage IV adenocarcinoma of the lung. The prognosis of this diagnosis is poor (even the absence of her other pressing medical issues) --Continue ongoing goals of care discussion and we will attempt to reach out to the patient's son tomorrow to arrange for a face-to-face meeting with him.   Mikey Bussing, DNP, AGPCNP-BC, AOCNP

## 2020-02-05 NOTE — Progress Notes (Signed)
Updated pt's daughter and son  With Stanton Kidney, NP with palliative. Was able to discuss with them her resp failure, immunocompromised state, new hcap, new dx of stage 4 adenocarcinoma and acute cva resulting in severe debilitation.   We discussed what continued aggressive care would look like, including but not limited to trach/peg/facility and ultimately still likely succumbing to either cancer additional infection. We discussed alternative of transitioning our medical therapies to focus on comfort as well. They had previously expressed interest in obtaining a second opinion or transfer to other facility. I advised them that I was happy to call any facility and discuss their mother's case with them. I did also let them know that due to the fact that 1. The current covid pandemic and 2. None of the facilities offer additional therapies that [facility] does that a transfer would be highly unlikely.   They expressed understanding of our information. They did also express appreciation at the willingness to call other facilities and understand the inability to transfer. They were able to discuss that they understand their mother's critical condition and certainly do not want her to "suffer".  A major concern of theirs was the ability for family and loved ones to be able to visit with pt to help come to a difficult conclusion. At that time Stanton Kidney continued the conversation.   I ensured no further questions from a medical standpoint and ansdwered all to the best of my ability. I attempted to provide reassurance that should they or other advocates had any they can certainly reach out to me or one of my partners.

## 2020-02-05 NOTE — Progress Notes (Signed)
ETT advanced back to 22 at the lip.

## 2020-02-05 NOTE — Progress Notes (Addendum)
Nutrition Follow-up  DOCUMENTATION CODES:   Non-severe (moderate) malnutrition in context of chronic illness  INTERVENTION:   Continue tube feeds via OG tube: - Osmolite 1.2 increase goal rate to 50 ml/hr x 22 h (1100 ml/day) - ProSource TF 45 ml decrease to BID  Tube feeding regimen provides 1400 kcal, 83 grams of protein, and 902 ml of H2O.   NUTRITION DIAGNOSIS:   Moderate Malnutrition related to chronic illness (HIV) as evidenced by mild muscle depletion, moderate muscle depletion, mild fat depletion, moderate fat depletion.  Ongoing, being addressed via TF  GOAL:   Patient will meet greater than or equal to 90% of their needs  Met via TF  MONITOR:   Vent status, Weight trends, TF tolerance, Skin, Labs     REASON FOR ASSESSMENT:   Consult Enteral/tube feeding initiation and management  ASSESSMENT:   79 yo female presents with left sided weakness, facial droop, aphasia and right eye deviation and  admitted with ischemic stoke insetting of large vessel occlusion of right MCA M1 segment requiring revascularization, acute blood loss anemia with concern of retroperitoneal bleed, respiratory failure requiring intubation. PMH includes HIV, HTN, HLD, DM, depression, hx of breast cancer  10/07 - admitted, intubated, s/p revascularization of R MCA M1 occlusion 10/08 - MRI multifocal infarct with mild petechial hemorrhage with small L cerebella infarct 10/09 - chest tube placed for large left pleural effusion  Patient with new HCAP, new dx stage 4 adenocarcinoma, and severe debilitation from acute CVA. Medical team discussing poor prognosis and goals of care with family. They have opted not to escalate care at this time.   Current TF: Osmolite 1.2 @ 40 ml/hr with ProSource TF 45 ml TID Tolerating TF well.  Pharmacy alerted RD that TF is being held for 2 hours per day for dolutegravir administration.  Patient remains intubated on ventilator support. MV: 7.5 L/min Temp  (24hrs), Avg:99.5 F (37.5 C), Min:98.1 F (36.7 C), Max:101.7 F (38.7 C)   Medications reviewed and include: colace, SSI q 4 hours, protonix, miralax  Labs and medications reviewed.   Diet Order:   Diet Order            Diet NPO time specified  Diet effective now                 EDUCATION NEEDS:   Not appropriate for education at this time  Skin:  Skin Assessment: Reviewed RN Assessment  Last BM:  10/14 rectal tube  Height:   Ht Readings from Last 1 Encounters:  01/29/20 _0  (1.473 m)    Weight:   Wt Readings from Last 1 Encounters:  01/29/20 53.7 kg    BMI:  Body mass index is 24.74 kg/m.  Estimated Nutritional Needs:   Kcal:  1350-1620 kcals  Protein:  75-90 grams  Fluid:  >/= 1.5 L   Lucas Mallow, RD, LDN, CNSC Please refer to Amion for contact information.

## 2020-02-05 NOTE — Progress Notes (Signed)
Pharmacy Antibiotic Note  Amber Lopez is a 79 y.o. female admitted on 01/29/2020 with sepsis.  Pharmacy has been consulted for Cefepime and vancomycin dosing. Patient now has increasing leukocytosis with copious tan secretions.   WBC up to 15. Tm 100.62F, SCr wnl.   Plan: -Cefepime 2 gm IV Q 12 hours -Vancomycin 1 gm IV load followed by vancomycin 500 mg IV Q 12 hours -Atovaquone for PCP prophylaxis (in the setting of sulfa allergy) per discussion with MD -Monitor CBC, renal fx, cultures and clinical progress -VT at SS   Height: 4\' 10"  (147.3 cm) Weight: 53.7 kg (118 lb 6.2 oz) IBW/kg (Calculated) : 40.9  Temp (24hrs), Avg:99.2 F (37.3 C), Min:97.3 F (36.3 C), Max:101.7 F (38.7 C)  Recent Labs  Lab 02/01/20 1036 02/02/20 1011 02/02/20 2114 02/03/20 0545 02/04/20 0540 02/05/20 0910  WBC 14.1* 8.1  --  11.0* 12.8* 15.2*  CREATININE 1.31* 0.99  --  0.86 0.86 0.81  LATICACIDVEN  --   --  1.6  --   --   --     Estimated Creatinine Clearance: 40.9 mL/min (by C-G formula based on SCr of 0.81 mg/dL).    Allergies  Allergen Reactions  . Sulfa Antibiotics Swelling and Rash    Antimicrobials this admission: Cefepime 10/14 >>  Vanc 10/14 >>  Atovaquone 10/14 >>  Dose adjustments this admission:  Microbiology results: 10/11 BCx: 1/2 Staph hominis (likely contaminant)    Thank you for allowing pharmacy to be a part of this patient's care.  Albertina Parr, PharmD., BCPS, BCCCP Clinical Pharmacist Please refer to [facility] for unit-specific pharmacist

## 2020-02-05 NOTE — Progress Notes (Signed)
NAME:  Amber Lopez, MRN:  [id number], DOB:  [birth year], LOS: 7 ADMISSION DATE:  01/29/2020, CONSULTATION DATE:  10/7 REFERRING MD: Dr. Estanislado Pandy, CHIEF COMPLAINT: Left Sided Weakness  Brief History   79 y/o F, with HIV, admitted 10/7 with LVO of R M1 s/p tPA, neuro IR revascularization.  Returned to ICU post procedure on mechanical ventilation. Additional finding of left superior sulcus mass and complex effusion.   History of present illness   79 y/o F who presented to [facility] on 10/7 with acute onset left sided weakness, facial droop, aphasia and right eye deviation. She was last known normal at 0640 when her daughter assisted her with am medications.  The patient was in bed but reportedly normal at that time.  Her daughter left and returned home finding her mother with weakness on the left side and unable to speak.  EMS was activated and CODE STROKE was activated in the field.  On arrival to the ER the patient was documented to have left sided hemiparesis, left facial droop, non-verbal and right gaze deviation. Initial CT of the head was negative for acute findings.  CTA head/neck demonstrated  Past Medical History  HIV/AIDS, HTN, HLD, DM, Depression , Arthritis, Breast Cancer   Significant Hospital Events   10/07 Admit with L hemiplegia, facial droop, aphasia with right eye deviation 10/11 Episode of worsened R eye deviation with tachycardia and HTN, concern for sz  Consults:  Neurology, IR  Oncology 10/13 Palliative care 10/13  Procedures:  ETT 10/7 >> L Radial ALine 10/7 >>  out R Femoral Sheath 10/7 >> out  Significant Diagnostic Tests:  10/7 CT Head Code Stroke >> no acute finding, generalized atrophy  10/7 CTA Head/Neck >> emergent LVO at the right M1 segment, no visible embolic source, atherosclerosis without flow limiting stenosis or ulceration of major vessels, left superior sulcus mass with large complex pleural effusion, associated mediastinal adenopathy  10/7 CT ABD  w/contrast >> Negative for hematoma 10/7 CT Chest w/contrast >> Persistent large left pleural effusion with near complete left lung collapse. Left apical mass measuring 3.5 x 3.3 cm suspicious for bronchogenic carcinoma 10/11 CT head>>no acute findings 10/11 EEG>>Continuous slow, generalized and maximal right frontotemporal region  Micro Data:  COVID 10/7 >> negative  Influenza A/B 10/7 >> negative Pleural fluid culture>>no growth three days 10/9 BCx2>> 1/2 with gram positives 10/7 MRSA screen>>negative 10/11 BC x 1 (from left PICC) > staph hominis 1/2  10/14 trach asp >> 10/14 BC >>  Antimicrobials:  10/7 cefazolin 10/8 dolutegravir/ emtricitabine/ tenofovir >>  10/14 vanc >> 10/14 cefepime >>  Interim history/subjective:  tmax 101.7 PSV 14/5 x 12 hrs yesterday, however failed this morning, increased thick/ yellow secretions Left CT output 175 ml/ 24hr No other events overnight   Objective   Blood pressure (!) 127/51, pulse (!) 111, temperature 99.9 F (37.7 C), resp. rate (!) 31, height 4\' 10"  (1.473 m), weight 53.7 kg, SpO2 100 %.    Vent Mode: PRVC FiO2 (%):  [40 %] 40 % Set Rate:  [16 bmp] 16 bmp Vt Set:  [330 mL] 330 mL PEEP:  [5 cmH20] 5 cmH20 Pressure Support:  [14 cmH20] 14 cmH20 Plateau Pressure:  [19 cmH20] 19 cmH20   Intake/Output Summary (Last 24 hours) at 02/05/2020 1024 Last data filed at 02/05/2020 0900 Gross per 24 hour  Intake 1492.73 ml  Output 1750 ml  Net -257.27 ml   Filed Weights   01/29/20 0700  Weight: 53.7 kg  General:  Critically ill older female in NAD, intubated/ on mechanical ventilation HEENT: MM pink/moist, pupils 4/reactive, ETT (18cm at teeth), OGT, left facial droop, tongue noted to have small blood blister- since popped, no active bleeding Neuro: Eyes open, follows commands on right, RUE 2-3/5, wiggles right toes, some toe movement on left, LUE flaccid CV: rr, ST, no murmur PULM:  MV, diffuse rhonchi with copious tan  secretions, failed SBT this am due to tachypnea in the 40/50's GI: soft, bs+, purwick  Extremities: warm/dry, trace LE edema, dependent edema LUE Skin: no rashes   Resolved Hospital Problem list   AKI  Assessment & Plan:   Acute, 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 Stable focal petechial hemorrhage, contrast stain in the right basal ganglia. MR with multi-focal infarct.  No acute findings after episode of worsened R eye dev.  Neurology following. TTE negative for vegetations or thrombosis.  Remains intubated as mental status precludes extubation P: - Management per neurology, continue statin and Asa  - ongoing neuro exams   Acute Respiratory failure secondary to above  P:  Full MV support Daily SBT trials  CXR now Advance ETT to 22 at lip  VAP/ PPI Minimize sedation- PRN fentanyl for RASS goal 0/-1 Sending trach asp given new fever/ copious secretions   Adenocarcinoma of the lung with malignant left pleural effusion, New - CT chest 10/8 demonstrates 3.5 x 3.3 cm mass concerning for bronchogenic carcinoma. No evidence of distant metastatic disease, new finding this admission - cytology positive for malignant cells consistent with malignant Adenocarcinoma - Oncology consulted 10/13; not a candidate for any treatments given her deconditioning 2/2 to her stroke; given her malignant pleural effusion, she would qualify as stage IV adenocarcinoma of the lung with very poor prognosis - PMT consulted 10/13 to help with GOC - continue left chest tube   Fever - remains hemodynamically stable - increasing leukocytosis and copious thick/ tan secretions - trach asp sent, BC ordered  - CXR ordered  - tylenol prn fever - starting vanc/ cefepime  History of HTN -normotensive, holding home meds  HIV/AIDS Last CD4 count 242 in 07/2019 - CD4 decreased to 185, pt compliant with treatment per family - ongoing Descovy and [place]  T2DM  -  SSI moderate - will add levemir given consistent CBGs > 170  Nutrition -TF  Hx of Breast Cancer  - initially diagnosed 2002 s/p lumpectomy and tamoxifen , recurrence 2010 with resection, XRT, Arimedex, last oncology note in 2018 from [facility] home anastrozole    Normocytic anemia P:  H/H stable  Trend CBC Transfuse for Hgb < 7  Best practice:  Diet: NPO/ TF Pain/Anxiety/Delirium protocol (if indicated): prn fentanyl VAP protocol (if indicated): In place  DVT prophylaxis: Lovenox GI prophylaxis: PPI  Glucose control: SSI Mobility: Bed Rest  Code Status: DNR reversed to Full Code as of 10/13 Family Communication: pending with Dr. Ruthann Cancer.  PMT has also been consulted to help with ongoing Lucas  Labs   CBC: Recent Labs  Lab 01/30/20 0500 01/30/20 1021 02/01/20 1036 02/02/20 1011 02/03/20 0545 02/04/20 0540 02/05/20 0910  WBC 10.6*   < > 14.1* 8.1 11.0* 12.8* 15.2*  NEUTROABS 8.7*  --   --   --   --   --   --   HGB 8.2*   < > 11.3* 9.6* 8.7* 8.8* 8.8*  HCT 24.8*   < > 35.5* 28.8* 27.5* 27.9*  27.4*  MCV 92.5   < > 89.9 87.8 89.9 89.4 89.3  PLT 261   < > 211 214 208 234 308   < > = values in this interval not displayed.    Basic Metabolic Panel: Recent Labs  Lab 01/31/20 0512 02/01/20 1036 02/02/20 1011 02/03/20 0545 02/04/20 0540 02/05/20 0453  NA 141 143 139 140 140  --   K 3.8 4.8 3.6 3.4* 3.9  --   CL 117* 116* 115* 114* 111  --   CO2 14* 15* 17* 19* 21*  --   GLUCOSE 121* 143* 227* 207* 208*  --   BUN 21 15 15 15 16   --   CREATININE 1.47* 1.31* 0.99 0.86 0.86  --   CALCIUM 7.3* 8.0* 7.6* 7.0* 7.3*  --   MG  --   --   --  1.4* 1.7 1.9  PHOS  --   --   --  1.2* 2.5  --    GFR: Estimated Creatinine Clearance: 38.5 mL/min (by C-G formula based on SCr of 0.86 mg/dL). Recent Labs  Lab 02/02/20 1011 02/02/20 2114 02/03/20 0545 02/04/20 0540 02/05/20 0910  WBC 8.1  --  11.0* 12.8* 15.2*  LATICACIDVEN  --  1.6  --   --   --     Liver  Function Tests: No results for input(s): AST, ALT, ALKPHOS, BILITOT, PROT, ALBUMIN in the last 168 hours. No results for input(s): LIPASE, AMYLASE in the last 168 hours. No results for input(s): AMMONIA in the last 168 hours.  ABG    Component Value Date/Time   PHART 7.419 01/30/2020 1042   PCO2ART 26.5 (L) 01/30/2020 1042   PO2ART 143 (H) 01/30/2020 1042   HCO3 17.3 (L) 01/30/2020 1042   TCO2 18 (L) 01/30/2020 1042   ACIDBASEDEF 7.0 (H) 01/30/2020 1042   O2SAT 99.0 01/30/2020 1042     Coagulation Profile: Recent Labs  Lab 01/30/20 1021  INR 1.5*    Cardiac Enzymes: No results for input(s): CKTOTAL, CKMB, CKMBINDEX, TROPONINI in the last 168 hours.  HbA1C: Hgb A1c MFr Bld  Date/Time Value Ref Range Status  01/29/2020 04:22 PM 6.5 (H) 4.8 - 5.6 % Final    Comment:    (NOTE) Pre diabetes:          5.7%-6.4%  Diabetes:              >6.4%  Glycemic control for   <7.0% adults with diabetes     CBG: Recent Labs  Lab 02/04/20 1657 02/04/20 1923 02/04/20 2316 02/05/20 0317 02/05/20 0756  GLUCAP 175* 190* 218* 176* 176*    CRITICAL CARE Performed by: Kennieth Rad   Total critical care time: 35 minutes  Critical care time was exclusive of separately billable procedures and treating other patients.  Critical care was necessary to treat or prevent imminent or life-threatening deterioration.  Critical care was time spent personally by me on the following activities: development of treatment plan with patient and/or surrogate as well as nursing, discussions with consultants, evaluation of patient's response to treatment, examination of patient, obtaining history from patient or surrogate, ordering and performing treatments and interventions, ordering and review of laboratory studies, ordering and review of radiographic studies, pulse oximetry and re-evaluation of patient's condition.  Kennieth Rad, ACNP [facility] 02/05/2020, 10:24  AM  See Shea Evans for personal pager PCCM on call pager [phone]

## 2020-02-05 NOTE — Progress Notes (Signed)
This chaplain responded to PMT consult for spiritual care.  The Pt. RN-Lauren updated the chaplain before the visit. The RN shared with the chaplain the Pt. family placed a silver cross on the Pt. chest. The chaplain held the Pt. hand and observed the Pt. resting comfortably during the visit. This chaplain is available for F/U spiritual care as needed.

## 2020-02-06 DIAGNOSIS — J9601 Acute respiratory failure with hypoxia: Secondary | ICD-10-CM | POA: Diagnosis not present

## 2020-02-06 DIAGNOSIS — I6300 Cerebral infarction due to thrombosis of unspecified precerebral artery: Secondary | ICD-10-CM | POA: Diagnosis not present

## 2020-02-06 DIAGNOSIS — C3492 Malignant neoplasm of unspecified part of left bronchus or lung: Secondary | ICD-10-CM | POA: Diagnosis not present

## 2020-02-06 DIAGNOSIS — I6601 Occlusion and stenosis of right middle cerebral artery: Secondary | ICD-10-CM | POA: Diagnosis not present

## 2020-02-06 DIAGNOSIS — Z515 Encounter for palliative care: Secondary | ICD-10-CM

## 2020-02-06 DIAGNOSIS — R9389 Abnormal findings on diagnostic imaging of other specified body structures: Secondary | ICD-10-CM

## 2020-02-06 LAB — CBC
HCT: 26 % — ABNORMAL LOW (ref 36.0–46.0)
Hemoglobin: 8.4 g/dL — ABNORMAL LOW (ref 12.0–15.0)
MCH: 29.1 pg (ref 26.0–34.0)
MCHC: 32.3 g/dL (ref 30.0–36.0)
MCV: 90 fL (ref 80.0–100.0)
Platelets: 348 10*3/uL (ref 150–400)
RBC: 2.89 MIL/uL — ABNORMAL LOW (ref 3.87–5.11)
RDW: 15.2 % (ref 11.5–15.5)
WBC: 16.4 10*3/uL — ABNORMAL HIGH (ref 4.0–10.5)
nRBC: 0.3 % — ABNORMAL HIGH (ref 0.0–0.2)

## 2020-02-06 LAB — GLUCOSE, CAPILLARY
Glucose-Capillary: 112 mg/dL — ABNORMAL HIGH (ref 70–99)
Glucose-Capillary: 156 mg/dL — ABNORMAL HIGH (ref 70–99)
Glucose-Capillary: 141 mg/dL — ABNORMAL HIGH (ref 70–99)
Glucose-Capillary: 146 mg/dL — ABNORMAL HIGH (ref 70–99)
Glucose-Capillary: 137 mg/dL — ABNORMAL HIGH (ref 70–99)
Glucose-Capillary: 154 mg/dL — ABNORMAL HIGH (ref 70–99)

## 2020-02-06 LAB — BASIC METABOLIC PANEL
Anion gap: 7 (ref 5–15)
BUN: 17 mg/dL (ref 8–23)
CO2: 24 mmol/L (ref 22–32)
Calcium: 7.4 mg/dL — ABNORMAL LOW (ref 8.9–10.3)
Chloride: 106 mmol/L (ref 98–111)
Creatinine, Ser: 0.83 mg/dL (ref 0.44–1.00)
GFR, Estimated: >60 mL/min (ref >60)
Glucose, Bld: 158 mg/dL — ABNORMAL HIGH (ref 70–99)
Potassium: 3.8 mmol/L (ref 3.5–5.1)
Sodium: 137 mmol/L (ref 135–145)

## 2020-02-06 LAB — TRIGLYCERIDES: Triglycerides: 92 mg/dL (ref <150)

## 2020-02-06 MED ORDER — FUROSEMIDE 10 MG/ML IJ SOLN: 40.0000 mg | Freq: Once | INTRAMUSCULAR | Status: AC

## 2020-02-06 MED ORDER — POTASSIUM CHLORIDE 20 MEQ/15ML (10%) PO SOLN
ORAL | Status: AC
  Administered 2020-02-06: 40 meq via ORAL
  Filled 2020-02-06: qty 30

## 2020-02-06 MED ORDER — MAGNESIUM SULFATE 2 GM/50ML IV SOLN
2.0000 g | Freq: Once | INTRAVENOUS | Status: AC
  Administered 2020-02-06: 2 g via INTRAVENOUS
  Filled 2020-02-06: qty 50

## 2020-02-06 MED ORDER — FUROSEMIDE 10 MG/ML IJ SOLN
INTRAMUSCULAR | Status: AC
  Administered 2020-02-06: 40 mg via INTRAVENOUS
  Filled 2020-02-06: qty 4

## 2020-02-06 MED ORDER — POTASSIUM CHLORIDE 20 MEQ/15ML (10%) PO SOLN: 40.0000 meq | Freq: Once | ORAL | Status: AC

## 2020-02-06 NOTE — Progress Notes (Signed)
This chaplain was pastorally present for F/U spiritual care with the Pt. The chaplain experienced more eye movement with the Pt. today. The chaplain chose to highlight some of the Pt. story with the Pt.  The Pt. RN-Lauren confirmed with the chaplain the Pt. responsiveness fluctuates.  Lauren also shared the Pt. family has not visited today.  The chaplain hopes to meet the family. F/U spiritual care is available as needed.

## 2020-02-06 NOTE — Progress Notes (Signed)
SLP Cancellation Note  Patient Details Name: Amber Lopez MRN: [id number] DOB: 11/26/1940   Cancelled treatment:       Reason Eval/Treat Not Completed: Medical issues which prohibited therapy. SLP has been following for potential to complete cognitive-linguistic test. Pt remains on the ventilator. Will sign off for now. Please reorder SLP if needed.     Osie Bond., M.A. [facility] Pager [phone] Office [phone]  02/06/2020, 7:19 AM

## 2020-02-06 NOTE — Progress Notes (Signed)
PHARMACY - PHYSICIAN COMMUNICATION CRITICAL VALUE ALERT - BLOOD CULTURE IDENTIFICATION (BCID)  Amber Lopez is an 79 y.o. female who presented to [facility] on 01/29/2020 with a chief complaint of L sided weakness, facial droop.  Vanc and cefepime were started 10/14 for fevers and tan-colored respiratory secretions.  Assessment:  Blood cultures with 1/4 bottles growing gram positive cocci.  Previous cultures growing gram positive cocci had a BCID showing staph hominis, which is likely a contaminant.   Name of physician (or Provider) Contacted: Lucio Edward, PharmD  Current antibiotics: vancomycin and cefepime  Changes to prescribed antibiotics recommended:  Patient is on recommended antibiotics - No changes needed  Results for orders placed or performed during the hospital encounter of 01/29/20  Blood Culture ID Panel (Reflexed) (Collected: 02/02/2020 10:30 PM)  Result Value Ref Range   Enterococcus faecalis NOT DETECTED NOT DETECTED   Enterococcus Faecium NOT DETECTED NOT DETECTED   Listeria monocytogenes NOT DETECTED NOT DETECTED   Staphylococcus species DETECTED (A) NOT DETECTED   Staphylococcus aureus (BCID) NOT DETECTED NOT DETECTED   Staphylococcus epidermidis NOT DETECTED NOT DETECTED   Staphylococcus lugdunensis NOT DETECTED NOT DETECTED   Streptococcus species NOT DETECTED NOT DETECTED   Streptococcus agalactiae NOT DETECTED NOT DETECTED   Streptococcus pneumoniae NOT DETECTED NOT DETECTED   Streptococcus pyogenes NOT DETECTED NOT DETECTED   A.calcoaceticus-baumannii NOT DETECTED NOT DETECTED   Bacteroides fragilis NOT DETECTED NOT DETECTED   Enterobacterales NOT DETECTED NOT DETECTED   Enterobacter cloacae complex NOT DETECTED NOT DETECTED   Escherichia coli NOT DETECTED NOT DETECTED   Klebsiella aerogenes NOT DETECTED NOT DETECTED   Klebsiella oxytoca NOT DETECTED NOT DETECTED   Klebsiella pneumoniae NOT DETECTED NOT DETECTED   Proteus species NOT DETECTED NOT  DETECTED   Salmonella species NOT DETECTED NOT DETECTED   Serratia marcescens NOT DETECTED NOT DETECTED   Haemophilus influenzae NOT DETECTED NOT DETECTED   Neisseria meningitidis NOT DETECTED NOT DETECTED   Pseudomonas aeruginosa NOT DETECTED NOT DETECTED   Stenotrophomonas maltophilia NOT DETECTED NOT DETECTED   Candida albicans NOT DETECTED NOT DETECTED   Candida auris NOT DETECTED NOT DETECTED   Candida glabrata NOT DETECTED NOT DETECTED   Candida krusei NOT DETECTED NOT DETECTED   Candida parapsilosis NOT DETECTED NOT DETECTED   Candida tropicalis NOT DETECTED NOT DETECTED   Cryptococcus neoformans/gattii NOT DETECTED NOT DETECTED    Dimple Nanas, PharmD PGY-1 Acute Care Pharmacy Resident Office: [phone] 02/06/2020 10:15 AM

## 2020-02-06 NOTE — Progress Notes (Addendum)
NAME:  Amber Lopez, MRN:  [id number], DOB:  [birth year], LOS: 76 ADMISSION DATE:  01/29/2020, CONSULTATION DATE:  10/7 REFERRING MD: Dr. Estanislado Pandy, CHIEF COMPLAINT: Left Sided Weakness  Brief History   79 y/o F, with HIV, admitted 10/7 with LVO of R M1 s/p tPA, neuro IR revascularization.  Returned to ICU post procedure on mechanical ventilation. Additional finding of left superior sulcus mass and complex effusion.   History of present illness   79 y/o F who presented to [facility] on 10/7 with acute onset left sided weakness, facial droop, aphasia and right eye deviation. She was last known normal at 0640 when her daughter assisted her with am medications.  The patient was in bed but reportedly normal at that time.  Her daughter left and returned home finding her mother with weakness on the left side and unable to speak.  EMS was activated and CODE STROKE was activated in the field.  On arrival to the ER the patient was documented to have left sided hemiparesis, left facial droop, non-verbal and right gaze deviation. Initial CT of the head was negative for acute findings.  CTA head/neck demonstrated  Past Medical History  HIV/AIDS, HTN, HLD, DM, Depression , Arthritis, Breast Cancer   Significant Hospital Events   10/07 Admit with L hemiplegia, facial droop, aphasia with right eye deviation 10/11 Episode of worsened R eye deviation with tachycardia and HTN, concern for sz  Consults:  Neurology, IR  Oncology 10/13 Palliative care 10/13  Procedures:  ETT 10/7 >> L Radial ALine 10/7 >>  out R Femoral Sheath 10/7 >> out  Significant Diagnostic Tests:  10/7 CT Head Code Stroke >> no acute finding, generalized atrophy  10/7 CTA Head/Neck >> emergent LVO at the right M1 segment, no visible embolic source, atherosclerosis without flow limiting stenosis or ulceration of major vessels, left superior sulcus mass with large complex pleural effusion, associated mediastinal adenopathy  10/7 CT ABD  w/contrast >> Negative for hematoma 10/7 CT Chest w/contrast >> Persistent large left pleural effusion with near complete left lung collapse. Left apical mass measuring 3.5 x 3.3 cm suspicious for bronchogenic carcinoma 10/11 CT head>>no acute findings 10/11 EEG>>Continuous slow, generalized and maximal right frontotemporal region  Micro Data:  COVID 10/7 >> negative  Influenza A/B 10/7 >> negative Pleural fluid culture>>no growth three days 10/9 BCx2>> 1/2 with gram positives 10/7 MRSA screen>>negative 10/11 BC x 1 (from left PICC) > staph hominis 1/2  10/14 trach asp >> few WBC, rare G+ cocci>> 10/14 BC >> gram + cocci>> contaminent  Antimicrobials:  10/7 cefazolin 10/8 dolutegravir/ emtricitabine/ tenofovir >>  10/14 vanc >> 10/14 cefepime >>  Interim history/subjective:  t max 101.7 PSV 10/5 >> had failed 10/14 2/2 thick secretions Left CT output 200 ml/ 24hr, to suction no leak  HGB 8.4, WBC 16.4 + 10.9 L Creatinine 0.83  Objective   Blood pressure (!) 147/57, pulse 89, temperature 99.5 F (37.5 C), resp. rate (!) 21, height 4\' 10"  (1.473 m), weight 53.7 kg, SpO2 100 %.    Vent Mode: PSV FiO2 (%):  [30 %-40 %] 30 % Set Rate:  [16 bmp] 16 bmp Vt Set:  [330 mL] 330 mL PEEP:  [5 cmH20] 5 cmH20 Pressure Support:  [10 cmH20] 10 cmH20   Intake/Output Summary (Last 24 hours) at 02/06/2020 1002 Last data filed at 02/06/2020 0921 Gross per 24 hour  Intake 2001.27 ml  Output 1500 ml  Net 501.27 ml   [employer]  01/29/20 0700  Weight: 53.7 kg   General:  Critically ill older female in NAD, intubated/ on mechanical ventilation HEENT: MM pink/moist, pupils 4/reactive, ETT (22cm at lip), OGT with TF infusing, left facial droop, tongue noted to have small blood blister- since popped, no active bleeding Neuro: Eyes open, follows commands on right, RUE 2-3/5, wiggles right toes, some toe movement on left, LUE flaccid CV: rr, ST, no murmur, no rub or gallop PULM:  Bilateral chest excursion,  MV, diffuse rhonchi with copious tan secretions, tolerating SBT 10/15 GI: soft, ND, NT,  bs+, purwick  Extremities: warm/dry, trace LE edema, dependent edema LUE Skin: no rashes, no lesions    Resolved Hospital Problem list   AKI  Assessment & Plan:   Acute, 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 Stable focal petechial hemorrhage, contrast stain in the right basal ganglia. MR with multi-focal infarct.  No acute findings after episode of worsened R eye dev.  Neurology following. TTE negative for vegetations or thrombosis.  Remains intubated as mental status precludes extubation P: - Management per neurology, continue statin and Asa  - ongoing neuro exams   Acute Respiratory failure secondary to above  New infiltrate and fever 10/14 P:  Full MV support Daily SBT trials  CXR in am and prn VAP/ PPI Minimize sedation- PRN fentanyl for RASS goal 0/-1 Sending trach asp given new fever/ copious secretions  Will give lasix 40 mg x 1 for + 10 L I&O status  Adenocarcinoma of the lung with malignant left pleural effusion, New - CT chest 10/8 demonstrates 3.5 x 3.3 cm mass concerning for bronchogenic carcinoma. No evidence of distant metastatic disease, new finding this admission - cytology positive for malignant cells consistent with malignant Adenocarcinoma - Oncology consulted 10/13; not a candidate for any treatments given her deconditioning 2/2 to her stroke; given her malignant pleural effusion, she would qualify as stage IV adenocarcinoma of the lung with very poor prognosis - PMT consulted 10/13 to help with GOC - continue left chest tube  - Consider comfort care>> Dr. Ruthann Cancer spoke with family 10/14>> partial Code family would like to discuss amongst themselves  Fever - remains hemodynamically stable - increasing leukocytosis and copious thick/ tan secretions - trach asp sent, BC ordered - Follow micro  -  Trend CXR  - tylenol prn fever - continue  vanc/ cefepime  History of HTN -normotensive, holding home meds  HIV/AIDS Last CD4 count 242 in 07/2019 - CD4 decreased to 185, pt compliant with treatment per family - ongoing [place] and [place]  T2DM  - SSI moderate - will add levemir given consistent CBGs > 170, Now < 150 on 10/15  Nutrition -TF  Hx of Breast Cancer  - initially diagnosed 2002 s/p lumpectomy and tamoxifen , recurrence 2010 with resection, XRT, Arimedex, last oncology note in 2018 from [facility] home anastrozole    Normocytic anemia P:  H/H stable  Trend CBC Transfuse for Hgb < 7 Monitor for bleeding  Improved renal function Plan Trend BMET Replete electrolytes as needed Will give mag 2 gm today ( 1.9 on 10/14)  Best practice:  Diet: NPO/ TF Pain/Anxiety/Delirium protocol (if indicated): prn fentanyl VAP protocol (if indicated): In place  DVT prophylaxis: Lovenox GI prophylaxis: PPI  Glucose control: SSI Mobility: Bed Rest  Code Status: DNR reversed to Full Code as of 10/13 Family Communication:   PMT has also been consulted to help with ongoing  GOC, Family need more time per Dr. Marcheta Grammes note 10/14 Will need ongoing discussion, appreciate palliative care assistance  Labs   CBC: Recent Labs  Lab 02/02/20 1011 02/03/20 0545 02/04/20 0540 02/05/20 0910 02/06/20 0427  WBC 8.1 11.0* 12.8* 15.2* 16.4*  HGB 9.6* 8.7* 8.8* 8.8* 8.4*  HCT 28.8* 27.5* 27.9* 27.4* 26.0*  MCV 87.8 89.9 89.4 89.3 90.0  PLT 214 208 234 308 767    Basic Metabolic Panel: Recent Labs  Lab 02/02/20 1011 02/03/20 0545 02/04/20 0540 02/05/20 0453 02/05/20 0910 02/06/20 0427  NA 139 140 140  --  139 137  K 3.6 3.4* 3.9  --  3.9 3.8  CL 115* 114* 111  --  107 106  CO2 17* 19* 21*  --  23 24  GLUCOSE 227* 207* 208*  --  215* 158*  BUN 15 15 16   --  14 17  CREATININE 0.99 0.86 0.86  --  0.81 0.83  CALCIUM 7.6* 7.0* 7.3*  --  7.4* 7.4*  MG  --  1.4*  1.7 1.9  --   --   PHOS  --  1.2* 2.5  --   --   --    GFR: Estimated Creatinine Clearance: 39.9 mL/min (by C-G formula based on SCr of 0.83 mg/dL). Recent Labs  Lab 02/02/20 1011 02/02/20 2114 02/03/20 0545 02/04/20 0540 02/05/20 0910 02/06/20 0427  WBC   < >  --  11.0* 12.8* 15.2* 16.4*  LATICACIDVEN  --  1.6  --   --   --   --    < > = values in this interval not displayed.    Liver Function Tests: No results for input(s): AST, ALT, ALKPHOS, BILITOT, PROT, ALBUMIN in the last 168 hours. No results for input(s): LIPASE, AMYLASE in the last 168 hours. No results for input(s): AMMONIA in the last 168 hours.  ABG    Component Value Date/Time   PHART 7.419 01/30/2020 1042   PCO2ART 26.5 (L) 01/30/2020 1042   PO2ART 143 (H) 01/30/2020 1042   HCO3 17.3 (L) 01/30/2020 1042   TCO2 18 (L) 01/30/2020 1042   ACIDBASEDEF 7.0 (H) 01/30/2020 1042   O2SAT 99.0 01/30/2020 1042     Coagulation Profile: Recent Labs  Lab 01/30/20 1021  INR 1.5*    Cardiac Enzymes: No results for input(s): CKTOTAL, CKMB, CKMBINDEX, TROPONINI in the last 168 hours.  HbA1C: Hgb A1c MFr Bld  Date/Time Value Ref Range Status  01/29/2020 04:22 PM 6.5 (H) 4.8 - 5.6 % Final    Comment:    (NOTE) Pre diabetes:          5.7%-6.4%  Diabetes:              >6.4%  Glycemic control for   <7.0% adults with diabetes     CBG: Recent Labs  Lab 02/05/20 1509 02/05/20 1916 02/05/20 2305 02/06/20 0313 02/06/20 0823  GLUCAP 167* 166* 155* 112* 156*    CRITICAL CARE Performed by: Magdalen Spatz   Total critical care time: 30 minutes  Critical care time was exclusive of separately billable procedures and treating other patients.  Critical care was necessary to treat or prevent imminent or life-threatening deterioration.  Critical care was time spent personally by me on the following activities: development of treatment plan with patient and/or surrogate as well as nursing, discussions with  consultants, evaluation of patient's response to treatment, examination of patient, obtaining history from patient or surrogate, ordering and performing treatments and interventions,  ordering and review of laboratory studies, ordering and review of radiographic studies, pulse oximetry and re-evaluation of patient's condition.  Magdalen Spatz, MSN, AGACNP-BC Brenda See Amion for personal pager PCCM on call pager [phone] [place] 02/06/2020, 10:02 AM

## 2020-02-06 NOTE — Progress Notes (Signed)
Brief oncology note:  I attempted to contact the patient's son earlier today per his request at the number listed in the chart.  His phone rang but no voicemail set up and I was unable to leave a message.  I have stopped by the patient's room on 2 occasions today no family were present.  Unfortunately, we have not been able to connect with the family to have a face-to-face discussion regarding her stage IV lung cancer.  Recommendations:  --patient is markedly deconditioned from her recent stroke and does not look to be a viable candidate for any treatments of her lung cancer. It is highly improbable she will recover to the point where she would be able to tolerate therapy.  --with a malignant pleural effusion, her cancer would qualify as Stage IV adenocarcinoma of the lung. The prognosis of this diagnosis is poor (even the absence of her other pressing medical issues) --Recommend continuation of ongoing goals of care discussion.  Please call oncology for additional questions.

## 2020-02-06 NOTE — Progress Notes (Signed)
Physical Therapy Treatment Patient Details Name: Amber Lopez MRN: [id number] DOB: 1941/02/21 Today's Date: 02/06/2020    History of Present Illness 79 y.o. female with a PMHx of depression, HLD, HIV, HTN and DM presenting with acute onset of left hemiplegia, left facial droop, right eye deviation and aphasia. Pt received IV tPA and underwent R MCA revacularization on 01/29/2020. Pt also found to have large left pleural effusion with near complete L lung collapse and L apical mass. Chest tube placed on 01/31/2020.    PT Comments    Pt with reduced activity tolerance this session and with less consistent command following despite being very alert. PT believes command following was affected by pt appearing uncomfortable and resulting in restlessness at the edge of bed. Pt continues to require totalA for all functional mobility and remains able to only purposefully mobilize R side at this time. Pt is able to provide some effort to maintaining sitting balance but also requires significant assistance to sit upright. Pt will continue to benefit from acute PT POC to improve mobility and reduce caregiver burden, as long as it continues to align with the pt and family goals of care.   Follow Up Recommendations  SNF     Equipment Recommendations  Wheelchair (measurements PT);Wheelchair cushion (measurements PT);Hospital bed    Recommendations for Other Services       Precautions / Restrictions Precautions Precautions: Fall Precaution Comments: chest tube, ETT Restrictions Weight Bearing Restrictions: No    Mobility  Bed Mobility Overal bed mobility: Needs Assistance Bed Mobility: Rolling;Supine to Sit;Sit to Supine Rolling: Total assist   Supine to sit: Total assist;+2 for physical assistance Sit to supine: Total assist;+2 for physical assistance      Transfers                    Ambulation/Gait                 Stairs             Wheelchair Mobility     Modified Rankin (Stroke Patients Only) Modified Rankin (Stroke Patients Only) Pre-Morbid Rankin Score: Slight disability Modified Rankin: Severe disability     Balance Overall balance assessment: Needs assistance Sitting-balance support: No upper extremity supported;Feet unsupported Sitting balance-Leahy Scale: Zero Sitting balance - Comments: max-totalA for static sitting Postural control: Posterior lean                                  Cognition Arousal/Alertness: Awake/alert Behavior During Therapy: Restless Overall Cognitive Status: Difficult to assess Area of Impairment: Problem solving;Following commands                       Following Commands: Follows one step commands inconsistently     Problem Solving: Slow processing;Requires verbal cues;Requires tactile cues General Comments: pt follows one step commands with R side intermittently, 25-50% of commands followed this session      Exercises      General Comments General comments (skin integrity, edema, etc.): pt on 30% FiO2, 5 PEEP, RR ranging from 25-35 during session. Brief period of RR in low 50s prior to mobilizing, PT and RN calm patient with improvement in RR      Pertinent Vitals/Pain Pain Assessment: Faces Faces Pain Scale: Hurts little more Pain Location: generalized with sitting up Pain Descriptors / Indicators: Restless Pain Intervention(s): Monitored during session    Home  Living                      Prior Function            PT Goals (current goals can now be found in the care plan section) Acute Rehab PT Goals Patient Stated Goal: Pt unable to state  Progress towards PT goals: Not progressing toward goals - comment (restless, limited activity tolerance)    Frequency    Min 3X/week      PT Plan Current plan remains appropriate    Co-evaluation              AM-PAC PT "6 Clicks" Mobility   Outcome Measure  Help needed turning from your back  to your side while in a flat bed without using bedrails?: Total Help needed moving from lying on your back to sitting on the side of a flat bed without using bedrails?: Total Help needed moving to and from a bed to a chair (including a wheelchair)?: Total Help needed standing up from a chair using your arms (e.g., wheelchair or bedside chair)?: Total Help needed to walk in hospital room?: Total Help needed climbing 3-5 steps with a railing? : Total 6 Click Score: 6    End of Session Equipment Utilized During Treatment: Oxygen Activity Tolerance: Patient limited by fatigue Patient left: in bed;with call bell/phone within reach;with bed alarm set Nurse Communication: Mobility status;Need for lift equipment PT Visit Diagnosis: Other abnormalities of gait and mobility (R26.89);Muscle weakness (generalized) (M62.81);Hemiplegia and hemiparesis;Other symptoms and signs involving the nervous system (R29.898) Hemiplegia - Right/Left: Left Hemiplegia - dominant/non-dominant: Non-dominant Hemiplegia - caused by: Cerebral infarction     Time: 3893-7342 PT Time Calculation (min) (ACUTE ONLY): 32 min  Charges:  $Therapeutic Activity: 23-37 mins                     Zenaida Niece, PT, DPT Acute Rehabilitation Pager: [phone]    Zenaida Niece 02/06/2020, 4:35 PM

## 2020-02-06 NOTE — Progress Notes (Signed)
STROKE TEAM PROGRESS NOTE   INTERVAL HISTORY Patient is neurological condition seems slightly improved today  She is awake but remains aphasic and does now  follow  Few commands on right side.  She has persistent right gaze deviation and dense left hemiplegia and purposeful movements in the right.  She remains febrile and with respiratory failure requiring ventilatory support but is weaning this am. Fever down but 1/4 blood culture positive for Gm positive cocci.Increase WBC with copius trch secretions.On Vanc/ cefipime  Vitals:   02/06/20 0500 02/06/20 0600 02/06/20 0700 02/06/20 0800  BP: (!) 125/46 (!) 139/54 (!) 134/54 (!) 105/95  Pulse: 96 82 94 90  Resp: (!) 24 (!) 22 (!) 28 (!) 22  Temp: 99.7 F (37.6 C) 99 F (37.2 C) 99.1 F (37.3 C) 99.3 F (37.4 C)  TempSrc:      SpO2: 100% 100% 100% 100%  Weight:      Height:       CBC:  Recent Labs  Lab 02/05/20 0910 02/06/20 0427  WBC 15.2* 16.4*  HGB 8.8* 8.4*  HCT 27.4* 26.0*  MCV 89.3 90.0  PLT 308 941   Basic Metabolic Panel:  Recent Labs  Lab 02/03/20 0545 02/03/20 0545 02/04/20 0540 02/04/20 0540 02/05/20 0453 02/05/20 0910 02/06/20 0427  NA 140   < > 140   < >  --  139 137  K 3.4*   < > 3.9   < >  --  3.9 3.8  CL 114*   < > 111   < >  --  107 106  CO2 19*   < > 21*   < >  --  23 24  GLUCOSE 207*   < > 208*   < >  --  215* 158*  BUN 15   < > 16   < >  --  14 17  CREATININE 0.86   < > 0.86   < >  --  0.81 0.83  CALCIUM 7.0*   < > 7.3*   < >  --  7.4* 7.4*  MG 1.4*   < > 1.7  --  1.9  --   --   PHOS 1.2*  --  2.5  --   --   --   --    < > = values in this interval not displayed.    IMAGING past 24 hours DG Chest Port 1 View  Result Date: 02/05/2020 CLINICAL DATA:  Hypoxia EXAM: PORTABLE CHEST 1 VIEW COMPARISON:  February 02, 2020 FINDINGS: Endotracheal tube tip is 3.9 cm above the carina. Nasogastric tube tip and side port are below the diaphragm. Central catheter tip is in the superior vena cava. There is a  chest tube on the left. No appreciable pneumothorax. There is a persistent small left pleural effusion with left base atelectasis. There is a focal opacity in the medial left apex region measuring 4.8 x 3.3 cm, concerning for neoplasm in this area. There is new ill-defined airspace opacity in the right upper lobe peripherally with overlying structures. It is difficult to ascertain how much of the opacity in this area represents potential infiltrate in the right upper lobe versus overlying structures. Heart size and pulmonary vascular normal. No adenopathy. There is aortic atherosclerosis. IMPRESSION: 1. Tube and catheter positions as described without appreciable pneumothorax. 2. Suspect neoplasm medial left apex region. This area may warrant contrast enhanced chest CT for further evaluation. 3.  Small left pleural effusion with left base atelectasis.  4. Ill-defined opacity right upper lobe peripherally which in part may be due to overlying structures but raising concern for developing pneumonia in this area. 5.  Stable cardiac silhouette. Aortic Atherosclerosis (ICD10-I70.0). Electronically Signed   By: Lowella Grip III M.D.   On: 02/05/2020 10:57    PHYSICAL EXAM    Temp:  [97.3 F (36.3 C)-101.5 F (38.6 C)] 99.3 F (37.4 C) (10/15 0800) Pulse Rate:  [75-116] 90 (10/15 0800) Resp:  [19-33] 22 (10/15 0800) BP: (105-165)/(46-114) 105/95 (10/15 0800) SpO2:  [100 %] 100 % (10/15 0800) FiO2 (%):  [30 %-40 %] 30 % (10/15 0749)  General - Well nourished, well developed elderly African-American lady, intubated on ventilator  Ophthalmologic - fundi not visualized due to noncooperation.  Cardiovascular - Regular rate and rhythm.  Neuro - intubated  , eyes open.  Gaze deviated to right.  Doll's eye movements are sluggish., not following commands.  Globally aphasic.   not blinking to visual threat bilaterally, doll's eyes sluggish, not tracking on the left visual field but seems able to track on the  right, PERRL. Corneal reflex present, gag and cough present. Breathing over the vent.  Facial symmetry not able to test due to ET tube.  Tongue protrusion not cooperative. Spontaneous movement of RUE and RLE and localize to pain, RUE 3/5 at least and RLE 3-/5 at least. LUE and LLE not spontaneous movement, but mild withdraw to pain. DTR 1+ and no babinski. Sensation, coordination and gait not tested.   ASSESSMENT/PLAN Ms. Amber Lopez is a 79 y.o. female with history of depression, HLD, HIV, HTN and DM presenting with left hemiplegia, left facial droop, right eye deviation and aphasia. Received IV tPA 01/29/2020 at 0816. -> IR 01/29/20  Stroke:  R MCA and small L cerebellar infarcts s/p tPA + IR R M1 occlusion w/ TICI2c revascularization, embolic secondary to unknown source possibly hypercoagulability from lung cancer  Code Stroke CT head No acute abnormality. Atrophy. ASPECTS 10.     CTA head & neck LVO R M1. Atherosclerosis. L superior sulcus mass w/ large pleural effusion malignant appearing w/ associated mediastinal adenopathy.  Cerebral angio / IR - R M1 occlusion s/p TICI2c revascularization using Tiger x 1, embotrap x 1, solitaire x 2.  Post IR CT - no ICH, contrast stain R basal ganglia  MRI  R MCA multifocal infarct w/ mild petechial hemorrhage. Small L cerebellar infarct. Atrophy,  CT Head repeat 01/31/20 - Expected evolution of right MCA nonhemorrhagic infarct. Stable focal subarachnoid hemorrhage near the right MCA bifurcation. Stable atrophy and white matter disease on the left.  CT head 10/11 stable w/ expected evolution infarct w/ slight increase in edema. Stable R SAH. Same small vessel disease, atrophy.  2D Echo EF 55 to 60%, large pleural effusion on the left  LDL 62  HgbA1c 6.5  VTE prophylaxis - Lovenox 40 mg sq daily   No antithrombotic prior to admission, ASA 81 mg daily started 01/31/20  Therapy recommendations:  SNF  Disposition:  pending   Acute Respiratory  Failure  Secondary to stroke  Intubated for IR, remains intubated w/ full support  Daily SBT trials  CXR new ill-defined opacity right upper lobe raising concern for developing pneumonia.  Small left pleural effusion with left base atelectasis and suspect neoplasm in the left medial apex.  Minimize sedation  Not a candidate for long-term trach   CCM on board   Fever  TMax 101.7  Leukocytosis 11.0->12.8->15.2->16.4  Blood Cx from  101/11 +Staph   CXR 02/05/20 new ill-defined opacity right upper lobe raising concern for developing pneumonia.  Small left pleural effusion with left base atelectasis and suspect neoplasm in the left medial apex.   Vanc/Cefepime 10/14>>    Severe anemia  Acute blood loss anemia s/p IR groin bleeding. Sheath removed using quick clot and pressure held   Hb 13.2->9.2->8.2->6.5 . . . 10.8->11.3->9.6->8.7->8.8->8.8->8.4  PRBC transfusion  CT abd/pelvis no retroperitoneal hemorrhage  Right groin oozing from sheath site - compressed - no active bleeding   Hgb slowly dropping  CCM on board  Adenocarcinoma of lung w/ L pleural effusion  CTA neck - L superior sulcus mass w/ large pleural effusion malignant appearing w/ associated mediastinal adenopathy.  CT chest large left pleural effusion with near complete left lung collapse.  Left apical mass suspicious for bronchogenic carcinoma  CT abd/pelvis no distal metastasis disease  Lt chest tube placed 01/31/20 - Dr Thayer Jew  cytology of pleural fluid - Malignant cells consistent with metastatic adenocarcinoma   Family not yet ready to pursue comfort care.   Oncology consult - stage IV adenocarcinoma of the lung, prognosis poor, not a candidate for aggressive tx  Palliative care consult in place   Family has agreed to not escalate care. Now partial DNR  Dr. Leonie Man spoke at length with CCM and RN  Possible Seizure 10/11  Worsening R gaze forced deviation in setting of HTN, no shaking or  jerking - 10/11 am   EEG continuous slowing  Repeat CT head 02/02/2020 slight increase in infarct now involving right caudate head as well but otherwise stable mass-effect on no midline shift  Hypotension/Hypertension  Home meds:  hctz 25, metoprolol 25  Off phenylephepdirine  . BP goal normotensive  Diabetes type II Controlled  Home meds:  None listed  HgbA1c 6.5, goal < 7.0  CBGs  SSI  On levimir 10  DB RN following    Dysphagia At risk malnutrition . Secondary to stroke . NPO . On TF   HIV  HARRP on Biktarvy.   CD4 in 07/2019 242  CD4 02/03/2020 185  Other Stroke Risk Factors  Advanced age  Migraines  Other Active Problems  Depression / anxiety  Hx right breast cancer 200 s/p lumpectomy and tamoxifen, recurred 2010 w/ resection, XRT, on arimidex PTA  CKD - stage 4  - creatinine - 0.83  Watery stools on miralax and colace. flexi-seal placed. Meds stopped.    Low phos and [facility] day # 8 Patient's prognosis remains poor though neurologically she is more alert she remains aphasic and is following only minimum commands with dense left hemiplegia and field cut.  She also has persistent respiratory failure with fever and copious tracheal secretions.  Patient's family has elected for partial code and continue to meet with palliative care to establish goals of care.  Doing tracheostomy and prolonging her life given advanced stage IV metastatic adenocarcinoma is not a great idea in my opinion.  I am available to meet with family if they have any questions.  Discussed with Dr. Baltazar Apo critical care medicine. This patient is critically ill and at significant risk of neurological worsening, death and care requires constant monitoring of vital signs, hemodynamics,respiratory and cardiac monitoring, extensive review of multiple databases, frequent neurological assessment, discussion with family, other specialists and medical decision making of  high complexity.I have made any additions or clarifications directly to the above note.This critical care time does not reflect procedure  time, or teaching time or supervisory time of PA/NP/Med Resident etc but could involve care discussion time.  I spent 30 minutes of neurocritical care time  in the care of  this patient.   Antony Contras, MD  To contact Stroke Continuity provider, please refer to [email or web address] After hours, contact General Neurology

## 2020-02-07 ENCOUNTER — Inpatient Hospital Stay (HOSPITAL_COMMUNITY): Payer: Medicare Other

## 2020-02-07 DIAGNOSIS — Z978 Presence of other specified devices: Secondary | ICD-10-CM | POA: Diagnosis not present

## 2020-02-07 DIAGNOSIS — Z9689 Presence of other specified functional implants: Secondary | ICD-10-CM | POA: Diagnosis not present

## 2020-02-07 DIAGNOSIS — J9601 Acute respiratory failure with hypoxia: Secondary | ICD-10-CM | POA: Diagnosis not present

## 2020-02-07 DIAGNOSIS — B20 Human immunodeficiency virus [HIV] disease: Secondary | ICD-10-CM

## 2020-02-07 DIAGNOSIS — D649 Anemia, unspecified: Secondary | ICD-10-CM

## 2020-02-07 DIAGNOSIS — I63511 Cerebral infarction due to unspecified occlusion or stenosis of right middle cerebral artery: Secondary | ICD-10-CM | POA: Diagnosis not present

## 2020-02-07 DIAGNOSIS — Z515 Encounter for palliative care: Secondary | ICD-10-CM

## 2020-02-07 DIAGNOSIS — L8990 Pressure ulcer of unspecified site, unspecified stage: Secondary | ICD-10-CM | POA: Insufficient documentation

## 2020-02-07 DIAGNOSIS — R1312 Dysphagia, oropharyngeal phase: Secondary | ICD-10-CM

## 2020-02-07 DIAGNOSIS — C3492 Malignant neoplasm of unspecified part of left bronchus or lung: Secondary | ICD-10-CM | POA: Diagnosis not present

## 2020-02-07 LAB — VANCOMYCIN, TROUGH: Vancomycin Tr: 14 ug/mL — ABNORMAL LOW (ref 15–20)

## 2020-02-07 LAB — GLUCOSE, CAPILLARY
Glucose-Capillary: 173 mg/dL — ABNORMAL HIGH (ref 70–99)
Glucose-Capillary: 130 mg/dL — ABNORMAL HIGH (ref 70–99)
Glucose-Capillary: 141 mg/dL — ABNORMAL HIGH (ref 70–99)
Glucose-Capillary: 169 mg/dL — ABNORMAL HIGH (ref 70–99)
Glucose-Capillary: 144 mg/dL — ABNORMAL HIGH (ref 70–99)

## 2020-02-07 LAB — BASIC METABOLIC PANEL
Anion gap: 8 (ref 5–15)
BUN: 17 mg/dL (ref 8–23)
CO2: 26 mmol/L (ref 22–32)
Calcium: 7.5 mg/dL — ABNORMAL LOW (ref 8.9–10.3)
Chloride: 105 mmol/L (ref 98–111)
Creatinine, Ser: 0.92 mg/dL (ref 0.44–1.00)
GFR, Estimated: 59 mL/min — ABNORMAL LOW (ref >60)
Glucose, Bld: 169 mg/dL — ABNORMAL HIGH (ref 70–99)
Potassium: 3.8 mmol/L (ref 3.5–5.1)
Sodium: 139 mmol/L (ref 135–145)

## 2020-02-07 LAB — MAGNESIUM: Magnesium: 2 mg/dL (ref 1.7–2.4)

## 2020-02-07 LAB — CBC
HCT: 24 % — ABNORMAL LOW (ref 36.0–46.0)
Hemoglobin: 7.6 g/dL — ABNORMAL LOW (ref 12.0–15.0)
MCH: 28.5 pg (ref 26.0–34.0)
MCHC: 31.7 g/dL (ref 30.0–36.0)
MCV: 89.9 fL (ref 80.0–100.0)
Platelets: 354 10*3/uL (ref 150–400)
RBC: 2.67 MIL/uL — ABNORMAL LOW (ref 3.87–5.11)
RDW: 15.4 % (ref 11.5–15.5)
WBC: 15.9 10*3/uL — ABNORMAL HIGH (ref 4.0–10.5)
nRBC: 0.1 % (ref 0.0–0.2)

## 2020-02-07 LAB — TRIGLYCERIDES: Triglycerides: 90 mg/dL (ref <150)

## 2020-02-07 MED ORDER — VANCOMYCIN HCL 750 MG/150ML IV SOLN
750.0000 mg | Freq: Two times a day (BID) | INTRAVENOUS | Status: DC
  Administered 2020-02-07 – 2020-02-11 (×9): 750 mg via INTRAVENOUS
  Filled 2020-02-07 (×9): qty 150

## 2020-02-07 NOTE — Progress Notes (Signed)
NAME:  Amber Lopez, MRN:  [id number], DOB:  [birth year], LOS: 9 ADMISSION DATE:  01/29/2020, CONSULTATION DATE:  10/7 REFERRING MD: Dr. Estanislado Pandy, CHIEF COMPLAINT: Left Sided Weakness  Brief History   79 y/o F, with HIV, admitted 10/7 with LVO of R M1 s/p tPA, neuro IR revascularization.  Returned to ICU post procedure on mechanical ventilation. Additional finding of left superior sulcus mass and complex effusion.   History of present illness   79 y/o F who presented to [facility] on 10/7 with acute onset left sided weakness, facial droop, aphasia and right eye deviation. She was last known normal at 0640 when her daughter assisted her with am medications.  The patient was in bed but reportedly normal at that time.  Her daughter left and returned home finding her mother with weakness on the left side and unable to speak.  EMS was activated and CODE STROKE was activated in the field.  On arrival to the ER the patient was documented to have left sided hemiparesis, left facial droop, non-verbal and right gaze deviation. Initial CT of the head was negative for acute findings.  CTA head/neck demonstrated  Past Medical History  HIV/AIDS, HTN, HLD, DM, Depression , Arthritis, Breast Cancer   Significant Hospital Events   10/07 Admit with L hemiplegia, facial droop, aphasia with right eye deviation 10/11 Episode of worsened R eye deviation with tachycardia and HTN, concern for sz  Consults:  Neurology, IR  Oncology 10/13 Palliative care 10/13  Procedures:  ETT 10/7 >> L Radial ALine 10/7 >>  out R Femoral Sheath 10/7 >> out  Significant Diagnostic Tests:  10/7 CT Head Code Stroke >> no acute finding, generalized atrophy  10/7 CTA Head/Neck >> emergent LVO at the right M1 segment, no visible embolic source, atherosclerosis without flow limiting stenosis or ulceration of major vessels, left superior sulcus mass with large complex pleural effusion, associated mediastinal adenopathy  10/7 CT ABD  w/contrast >> Negative for hematoma 10/7 CT Chest w/contrast >> Persistent large left pleural effusion with near complete left lung collapse. Left apical mass measuring 3.5 x 3.3 cm suspicious for bronchogenic carcinoma 10/11 CT head>>no acute findings 10/11 EEG>>Continuous slow, generalized and maximal right frontotemporal region  Micro Data:  COVID 10/7 >> negative  Influenza A/B 10/7 >> negative Pleural fluid culture>>no growth three days 10/9 BCx2>> 1/2 with gram positives 10/7 MRSA screen>>negative 10/11 BC x 1 (from left PICC) > staph hominis 1/2  10/14 trach asp >> few WBC, rare G+ cocci>> 10/14 BC >> gram + cocci>> contaminent  Antimicrobials:  10/7 cefazolin 10/8 dolutegravir/ emtricitabine/ tenofovir >>  10/14 vanc >> 10/14 cefepime >>  Interim history/subjective:   No significant changes reported.  Chest tube output 110 cc over last 24 hours Tolerating PSV 10 WBC 15.9 Remains on vancomycin and cefepime  Objective   Blood pressure (!) 160/62, pulse 95, temperature 98.2 F (36.8 C), resp. rate (!) 21, height 4\' 10"  (1.473 m), weight 53.7 kg, SpO2 100 %.    Vent Mode: PSV FiO2 (%):  [30 %] 30 % Set Rate:  [16 bmp] 16 bmp Vt Set:  [330 mL] 330 mL PEEP:  [5 cmH20] 5 cmH20 Pressure Support:  [10 cmH20] 10 cmH20   Intake/Output Summary (Last 24 hours) at 02/07/2020 1126 Last data filed at 02/07/2020 1000 Gross per 24 hour  Intake 1725.83 ml  Output 1960 ml  Net -234.17 ml   Filed Weights   01/29/20 0700  Weight: 53.7 kg  General: Acute and chronically ill-appearing woman, intubated on MV HEENT: ET tube in place, left facial droop Neuro: Opens eyes nods weakly, intermittently follows commands.  Flaccid on the left CV: Regular, distant, no murmur PULM: Significant tan secretions.  Clear, no wheeze GI: Nondistended, positive bowel sounds Extremities: Left upper extremity and left lower extremity edema Skin: No rash  Resolved Hospital Problem list    AKI  Assessment & Plan:   Acute, 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 Stable focal petechial hemorrhage, contrast stain in the right basal ganglia. MR with multi-focal infarct.  No acute findings after episode of worsened R eye dev.  Neurology following. TTE negative for vegetations or thrombosis.  Remains intubated as mental status precludes extubation P: -Statin, ASA -Neurology managing  Acute Respiratory failure secondary to above  New infiltrate and fever 10/14 P:  -Okay for PSV but she does not have the strength, airway protection to tolerate extubation.  I would not extubate unless we are clear that the overall plan of care, family wish is no reintubation, comfort if she fails -Follow intermittent chest x-ray -Diuresis as she can tolerate, gauge daily.  Adenocarcinoma of the lung with malignant left pleural effusion, New - CT chest 10/8 demonstrates 3.5 x 3.3 cm mass concerning for bronchogenic carcinoma. No evidence of distant metastatic disease, new finding this admission - cytology positive for malignant cells consistent with malignant Adenocarcinoma P: -Poor candidate for any therapeutics due to her deconditioned state -Appreciate palliative care input and management.  Planning for withdrawal of care 10/19 -No escalation of care  Fever -On empiric vancomycin, cefepime  History of HTN -Home medications on hold  HIV/AIDS Last CD4 count 242 in 07/2019 -Discovy and [place]  T2DM  -Sliding scale insulin per protocol -Levemir 10 units twice daily  Nutrition -TF  Hx of Breast Cancer  - initially diagnosed 2002 s/p lumpectomy and tamoxifen , recurrence 2010 with resection, XRT, Arimedex, last oncology note in 2018 from [facility] home anastrozole    Normocytic anemia P:  H/H stable  -Follow CBC  Improved renal function Plan -Follow urine output, BMP  Best practice:  Diet: NPO/  TF Pain/Anxiety/Delirium protocol (if indicated): prn fentanyl VAP protocol (if indicated): In place  DVT prophylaxis: Lovenox GI prophylaxis: PPI  Glucose control: SSI Mobility: Bed Rest  Code Status: DNR reversed to Full Code as of 10/13 Family Communication:   Appreciate palliative care assistance.  Looks like plan is for family gathering next week and probable withdrawal of care.  Labs   CBC: Recent Labs  Lab 02/03/20 0545 02/04/20 0540 02/05/20 0910 02/06/20 0427 02/07/20 0502  WBC 11.0* 12.8* 15.2* 16.4* 15.9*  HGB 8.7* 8.8* 8.8* 8.4* 7.6*  HCT 27.5* 27.9* 27.4* 26.0* 24.0*  MCV 89.9 89.4 89.3 90.0 89.9  PLT 208 234 308 348 938    Basic Metabolic Panel: Recent Labs  Lab 02/03/20 0545 02/04/20 0540 02/05/20 0453 02/05/20 0910 02/06/20 0427 02/07/20 0502  NA 140 140  --  139 137 139  K 3.4* 3.9  --  3.9 3.8 3.8  CL 114* 111  --  107 106 105  CO2 19* 21*  --  23 24 26   GLUCOSE 207* 208*  --  215* 158* 169*  BUN 15 16  --  14 17 17   CREATININE 0.86 0.86  --  0.81 0.83 0.92  CALCIUM 7.0* 7.3*  --  7.4* 7.4* 7.5*  MG 1.4* 1.7  1.9  --   --  2.0  PHOS 1.2* 2.5  --   --   --   --    GFR: Estimated Creatinine Clearance: 36 mL/min (by C-G formula based on SCr of 0.92 mg/dL). Recent Labs  Lab 02/02/20 2114 02/03/20 0545 02/04/20 0540 02/05/20 0910 02/06/20 0427 02/07/20 0502  WBC  --    < > 12.8* 15.2* 16.4* 15.9*  LATICACIDVEN 1.6  --   --   --   --   --    < > = values in this interval not displayed.    Liver Function Tests: No results for input(s): AST, ALT, ALKPHOS, BILITOT, PROT, ALBUMIN in the last 168 hours. No results for input(s): LIPASE, AMYLASE in the last 168 hours. No results for input(s): AMMONIA in the last 168 hours.  ABG    Component Value Date/Time   PHART 7.419 01/30/2020 1042   PCO2ART 26.5 (L) 01/30/2020 1042   PO2ART 143 (H) 01/30/2020 1042   HCO3 17.3 (L) 01/30/2020 1042   TCO2 18 (L) 01/30/2020 1042   ACIDBASEDEF 7.0 (H)  01/30/2020 1042   O2SAT 99.0 01/30/2020 1042     Coagulation Profile: No results for input(s): INR, PROTIME in the last 168 hours.  Cardiac Enzymes: No results for input(s): CKTOTAL, CKMB, CKMBINDEX, TROPONINI in the last 168 hours.  HbA1C: Hgb A1c MFr Bld  Date/Time Value Ref Range Status  01/29/2020 04:22 PM 6.5 (H) 4.8 - 5.6 % Final    Comment:    (NOTE) Pre diabetes:          5.7%-6.4%  Diabetes:              >6.4%  Glycemic control for   <7.0% adults with diabetes     CBG: Recent Labs  Lab 02/06/20 1616 02/06/20 1924 02/06/20 2315 02/07/20 0315 02/07/20 0725  GLUCAP 146* 137* 154* 173* 130*    CRITICAL CARE N/A   Baltazar Apo, MD, PhD 02/07/2020, 11:34 AM [facility] [phone] or if no answer [phone]

## 2020-02-07 NOTE — Progress Notes (Signed)
STROKE TEAM PROGRESS NOTE   INTERVAL HISTORY RN at the bedside. Pt still intubated on vent. Able to open eyes on voice, able to gaze bilaterally but more right preference, seemed to follow one simple commands but not consistent after. Vital stable now but still had low grade fever overnight.   Vitals:   02/07/20 0900 02/07/20 1000 02/07/20 1100 02/07/20 1334  BP: (!) 140/52 (!) 160/62 (!) 136/121   Pulse: 79 95 98 85  Resp: 18 (!) 21 (!) 23 (!) 22  Temp: 99.9 F (37.7 C) 98.2 F (36.8 C) 99.7 F (37.6 C) 99.9 F (37.7 C)  TempSrc:      SpO2: 100% 100% 100% 100%  Weight:      Height:       CBC:  Recent Labs  Lab 02/06/20 0427 02/07/20 0502  WBC 16.4* 15.9*  HGB 8.4* 7.6*  HCT 26.0* 24.0*  MCV 90.0 89.9  PLT 348 258   Basic Metabolic Panel:  Recent Labs  Lab 02/03/20 0545 02/03/20 0545 02/04/20 0540 02/04/20 0540 02/05/20 0453 02/05/20 0910 02/06/20 0427 02/07/20 0502  NA 140   < > 140  --   --    < > 137 139  K 3.4*   < > 3.9  --   --    < > 3.8 3.8  CL 114*   < > 111  --   --    < > 106 105  CO2 19*   < > 21*  --   --    < > 24 26  GLUCOSE 207*   < > 208*  --   --    < > 158* 169*  BUN 15   < > 16  --   --    < > 17 17  CREATININE 0.86   < > 0.86  --   --    < > 0.83 0.92  CALCIUM 7.0*   < > 7.3*  --   --    < > 7.4* 7.5*  MG 1.4*   < > 1.7   < > 1.9  --   --  2.0  PHOS 1.2*  --  2.5  --   --   --   --   --    < > = values in this interval not displayed.    IMAGING past 24 hours DG CHEST PORT 1 VIEW  Result Date: 02/07/2020 CLINICAL DATA:  Hypoxia EXAM: PORTABLE CHEST 1 VIEW COMPARISON:  February 05, 2020 FINDINGS: Endotracheal tube tip is 2.5 cm above the carina. Nasogastric tube tip and side port are below the diaphragm. Central catheter tip is in the superior vena cava. Chest tube again noted on the left. No pneumothorax. Small left pleural effusion with left base atelectasis persists. Opacity in the medial left apex region measures 4.8 x 4.3 cm, stable,  concerning for mass in this area. There is right base atelectasis. Right lung otherwise clear. Heart size and pulmonary vascularity are within normal limits. There is aortic atherosclerosis. No appreciable adenopathy. No bone lesions. IMPRESSION: Tube and catheter positions as described without pneumothorax. Suspect mass in the medial left apex. Correlation with chest CT, ideally with intravenous contrast, to further evaluate this area may well be warranted. Small left pleural effusion with bibasilar atelectasis. Stable cardiac silhouette. Aortic Atherosclerosis (ICD10-I70.0). Electronically Signed   By: Lowella Grip III M.D.   On: 02/07/2020 09:02    PHYSICAL EXAM  Temp:  [98.2 F (36.8 C)-100.8 F (  38.2 C)] 99.9 F (37.7 C) (10/16 1334) Pulse Rate:  [79-105] 85 (10/16 1334) Resp:  [18-32] 22 (10/16 1334) BP: (107-178)/(38-121) 136/121 (10/16 1100) SpO2:  [100 %] 100 % (10/16 1334) FiO2 (%):  [30 %] 30 % (10/16 1334)  General - Well nourished, well developed elderly African-American lady, intubated on ventilator  Ophthalmologic - fundi not visualized due to noncooperation.  Cardiovascular - Regular rate and rhythm.  Neuro - intubated on vent, eyes slowly open on voice. Initially showed me two fingers on the right hand, but not able to continue follow any other simple commands. Able to have bilateral gaze but still right gaze preference. Not blinking to visual threat bilaterally, not tracking bilaterally, PERRL. Corneal reflex present, gag and cough present. Breathing over the vent.  Facial symmetry not able to test due to ET tube.  Tongue protrusion not cooperative. Spontaneous movement of RUE and localize to pain, with draw RLE to pain. LUE and LLE not spontaneous movement, but slight withdraw to pain. DTR 1+ and no babinski. Sensation, coordination and gait not tested.   ASSESSMENT/PLAN Ms. SARISSA DERN is a 79 y.o. female with history of depression, HLD, HIV, HTN and DM presenting  with left hemiplegia, left facial droop, right eye deviation and aphasia. Received IV tPA 01/29/2020 at 0816. -> IR 01/29/20  Stroke:  R MCA and small L cerebellar infarcts s/p tPA + IR R M1 occlusion w/ TICI2c revascularization, embolic secondary to unknown source possibly hypercoagulability from lung cancer  Code Stroke CT head No acute abnormality. Atrophy. ASPECTS 10.     CTA head & neck LVO R M1. Atherosclerosis. L superior sulcus mass w/ large pleural effusion malignant appearing w/ associated mediastinal adenopathy.  Cerebral angio / IR - R M1 occlusion s/p TICI2c revascularization using Tiger x 1, embotrap x 1, solitaire x 2.  Post IR CT - no ICH, contrast stain R basal ganglia  MRI  R MCA multifocal infarct w/ mild petechial hemorrhage. Small L cerebellar infarct. Atrophy,  CT Head repeat 01/31/20 - Expected evolution of right MCA nonhemorrhagic infarct. Stable focal subarachnoid hemorrhage near the right MCA bifurcation. Stable atrophy and white matter disease on the left.  CT head 10/11 stable w/ expected evolution infarct w/ slight increase in edema. Stable R SAH. Same small vessel disease, atrophy.  2D Echo EF 55 to 60%, large pleural effusion on the left  LDL 62  HgbA1c 6.5  VTE prophylaxis - Lovenox 40 mg sq daily   No antithrombotic prior to admission, ASA 81 mg daily started 01/31/20  Therapy recommendations:  SNF  Disposition:  pending   Acute Respiratory Failure  Secondary to stroke  Intubated for IR, remains intubated w/ full support  CXR new ill-defined opacity right upper lobe raising concern for developing pneumonia.  Small left pleural effusion with left base atelectasis and suspect neoplasm in the left medial apex.  Not a candidate for long-term trach   CCM on board  Palliative care on board, possible one way extubation 10/19  Fever and leukocytosis  TMax 101.7->100.4  Leukocytosis 11.0->12.8->15.2->16.4->15.9  Blood Cx from  101/11 +Staph    CXR 02/07/20 Suspect mass in the medial left apex. Correlation with chest CT, ideally with intravenous contrast, to further evaluate this area may well be warranted. Small left pleural effusion with bibasilar .atelectasis.  Blood cultures 10/14 - 1/2 staph hominis - sensitivity - pending  Sputum Cultures 10/14 - pending  Vanc/Cefepime 10/14>>   Severe anemia  Acute blood loss anemia  s/p IR groin bleeding. Sheath removed using quick clot and pressure held   Hb 13.2->9.2->8.2->6.5 . .8.8->8.8->8.4->7.6  S/p PRBC transfusion  CT abd/pelvis no retroperitoneal hemorrhage  CBC monitoring  Adenocarcinoma of lung w/ L pleural effusion  CTA neck - L superior sulcus mass w/ large pleural effusion malignant appearing w/ associated mediastinal adenopathy.  CT chest large left pleural effusion with near complete left lung collapse.  Left apical mass suspicious for bronchogenic carcinoma  CT abd/pelvis no distal metastasis disease  Lt chest tube placed 01/31/20 - Dr Thayer Jew  Cytology of pleural fluid - Malignant cells consistent with metastatic adenocarcinoma   Oncology consult - stage IV adenocarcinoma of the lung, prognosis poor, not a candidate for aggressive tx - signed off 10/15  Family has agreed to not escalate care. Now partial DNR  Palliative care on board - Continue current level of care through Tuesday October 19th at 3:30, giving family time to visit and put things in order.  I will meet with them for a probable liberation from the vent at that time. Limited code-no compressions. No desire for Lurline Idol or PEG now or in the future   Possible Seizure 10/11  Worsening R gaze forced deviation in setting of HTN, no shaking or jerking - 10/11 am   EEG continuous slowing  Repeat CT head 02/02/2020 slight increase in infarct now involving right caudate head as well but otherwise stable mass-effect on no midline shift  Hypotension/Hypertension  Home meds:  hctz 25, metoprolol  25  Current - Labetalol prn  Off phenylephepdirine   Stable now . BP goal normotensive   Diabetes type II Controlled  Home meds:  None listed  HgbA1c 6.5, goal < 7.0  CBGs  SSI  On levimir 10 bid  DB RN following    Dysphagia At risk malnutrition . Secondary to stroke . NPO . On TF   HIV  On HARRP   CD4 in 07/2019 242  CD4 02/03/2020 185  Other Stroke Risk Factors  Advanced age  Migraines   Other Active Problems  Depression / anxiety  Hx right breast cancer 200 s/p lumpectomy and tamoxifen, recurred 2010 w/ resection, XRT, on arimidex PTA  Watery stools on miralax and colace. flexi-seal placed. Meds stopped.    Hospital day # 9  This patient is critically ill due to right MCA stroke, respiratory failure, severe anemia, lung cancer with pleural effusion, possible seizure, hypotension and at significant risk of neurological worsening, death form recurrent stroke, hemorrhagic conversion, heart failure, sepsis, septic shock, hypovolemic shock, status epilepticus. This patient's care requires constant monitoring of vital signs, hemodynamics, respiratory and cardiac monitoring, review of multiple databases, neurological assessment, discussion with family, other specialists and medical decision making of high complexity. I spent 30 minutes of neurocritical care time in the care of this patient.  Rosalin Hawking, MD PhD Stroke Neurology 02/07/2020 4:17 PM  To contact Stroke Continuity provider, please refer to [email or web address] After hours, contact General Neurology

## 2020-02-07 NOTE — Progress Notes (Signed)
Pharmacy Antibiotic Note  Amber Lopez is a 79 y.o. female admitted on 01/29/2020 with sepsis/PNA.  Pharmacy has been consulted for Cefepime and vancomycin dosing. SCr wnl stable.  Vancomycin trough very slightly subtherapeutic at 14 today, drawn appropriately.  Plan: Cefepime 2 gm IV Q 12 hours Increase vancomycin to 750mg  IV q12h Atovaquone for PCP prophylaxis (in the setting of sulfa allergy) per discussion with MD Monitor clinical progress, c/s, renal function F/u de-escalation plan/LOT, vancomycin levels as indicated   Height: 4\' 10"  (147.3 cm) Weight: 53.7 kg (118 lb 6.2 oz) IBW/kg (Calculated) : 40.9  Temp (24hrs), Avg:99.3 F (37.4 C), Min:98.2 F (36.8 C), Max:100.8 F (38.2 C)  Recent Labs  Lab 02/02/20 1011 02/02/20 2114 02/03/20 0545 02/04/20 0540 02/05/20 0910 02/06/20 0427 02/07/20 0502 02/07/20 1124  WBC   < >  --  11.0* 12.8* 15.2* 16.4* 15.9*  --   CREATININE   < >  --  0.86 0.86 0.81 0.83 0.92  --   LATICACIDVEN  --  1.6  --   --   --   --   --   --   VANCOTROUGH  --   --   --   --   --   --   --  14*   < > = values in this interval not displayed.    Estimated Creatinine Clearance: 36 mL/min (by C-G formula based on SCr of 0.92 mg/dL).    Allergies  Allergen Reactions  . Sulfa Antibiotics Swelling and Rash    Arturo Morton, PharmD, BCPS Please check AMION for all [facility] contact numbers Clinical Pharmacist 02/07/2020 1:07 PM

## 2020-02-08 DIAGNOSIS — J9601 Acute respiratory failure with hypoxia: Secondary | ICD-10-CM | POA: Diagnosis not present

## 2020-02-08 DIAGNOSIS — J9602 Acute respiratory failure with hypercapnia: Secondary | ICD-10-CM

## 2020-02-08 DIAGNOSIS — I63511 Cerebral infarction due to unspecified occlusion or stenosis of right middle cerebral artery: Secondary | ICD-10-CM | POA: Diagnosis not present

## 2020-02-08 DIAGNOSIS — Z978 Presence of other specified devices: Secondary | ICD-10-CM | POA: Diagnosis not present

## 2020-02-08 DIAGNOSIS — Z9689 Presence of other specified functional implants: Secondary | ICD-10-CM | POA: Diagnosis not present

## 2020-02-08 DIAGNOSIS — C3492 Malignant neoplasm of unspecified part of left bronchus or lung: Secondary | ICD-10-CM | POA: Diagnosis not present

## 2020-02-08 LAB — CULTURE, RESPIRATORY W GRAM STAIN

## 2020-02-08 LAB — CULTURE, BLOOD (ROUTINE X 2)

## 2020-02-08 LAB — GLUCOSE, CAPILLARY
Glucose-Capillary: 168 mg/dL — ABNORMAL HIGH (ref 70–99)
Glucose-Capillary: 171 mg/dL — ABNORMAL HIGH (ref 70–99)
Glucose-Capillary: 139 mg/dL — ABNORMAL HIGH (ref 70–99)
Glucose-Capillary: 132 mg/dL — ABNORMAL HIGH (ref 70–99)
Glucose-Capillary: 124 mg/dL — ABNORMAL HIGH (ref 70–99)
Glucose-Capillary: 153 mg/dL — ABNORMAL HIGH (ref 70–99)

## 2020-02-08 NOTE — Progress Notes (Signed)
NAME:  Amber Lopez, MRN:  [id number], DOB:  December 29, 1940, LOS: 64 ADMISSION DATE:  01/29/2020, CONSULTATION DATE:  10/7 REFERRING MD: Dr. Estanislado Pandy, CHIEF COMPLAINT: Left Sided Weakness  Brief History   79 y/o F, with HIV, admitted 10/7 with LVO of R M1 s/p tPA, neuro IR revascularization.  Returned to ICU post procedure on mechanical ventilation. Additional finding of left superior sulcus mass and complex effusion.   History of present illness   79 y/o F who presented to [facility] on 10/7 with acute onset left sided weakness, facial droop, aphasia and right eye deviation. She was last known normal at 0640 when her daughter assisted her with am medications.  The patient was in bed but reportedly normal at that time.  Her daughter left and returned home finding her mother with weakness on the left side and unable to speak.  EMS was activated and CODE STROKE was activated in the field.  On arrival to the ER the patient was documented to have left sided hemiparesis, left facial droop, non-verbal and right gaze deviation. Initial CT of the head was negative for acute findings.  CTA head/neck demonstrated  Past Medical History  HIV/AIDS, HTN, HLD, DM, Depression , Arthritis, Breast Cancer   Significant Hospital Events   10/07 Admit with L hemiplegia, facial droop, aphasia with right eye deviation 10/11 Episode of worsened R eye deviation with tachycardia and HTN, concern for sz  Consults:  Neurology, IR  Oncology 10/13 Palliative care 10/13  Procedures:  ETT 10/7 >> L Radial ALine 10/7 >>  out R Femoral Sheath 10/7 >> out  Significant Diagnostic Tests:  10/7 CT Head Code Stroke >> no acute finding, generalized atrophy  10/7 CTA Head/Neck >> emergent LVO at the right M1 segment, no visible embolic source, atherosclerosis without flow limiting stenosis or ulceration of major vessels, left superior sulcus mass with large complex pleural effusion, associated mediastinal adenopathy  10/7 CT ABD  w/contrast >> Negative for hematoma 10/7 CT Chest w/contrast >> Persistent large left pleural effusion with near complete left lung collapse. Left apical mass measuring 3.5 x 3.3 cm suspicious for bronchogenic carcinoma 10/11 CT head>>no acute findings 10/11 EEG>>Continuous slow, generalized and maximal right frontotemporal region  Micro Data:  COVID 10/7 >> negative  Influenza A/B 10/7 >> negative Pleural fluid culture>>no growth three days 10/9 BCx2>> 1/2 with gram positives 10/7 MRSA screen>>negative 10/11 BC x 1 (from left PICC) > staph hominis 1/2  10/14 trach asp >> few WBC, rare G+ cocci>> few stenotrophomonas  10/14 BC >> gram + cocci>> contaminent  Antimicrobials:  10/7 cefazolin 10/8 dolutegravir/ emtricitabine/ tenofovir >>  10/14 vanc >> 10/14 cefepime >>  Interim history/subjective:   No significant changes reported.  Chest tube output 110 cc over last 24 hours Tolerating PSV 10 WBC 15.9 Remains on vancomycin and cefepime  Objective   Blood pressure (!) 136/47, pulse 83, temperature 100 F (37.8 C), resp. rate (!) 22, height 4\' 10"  (1.473 m), weight 53.7 kg, SpO2 100 %.    Vent Mode: PRVC FiO2 (%):  [30 %] 30 % Set Rate:  [16 bmp] 16 bmp Vt Set:  [330 mL] 330 mL PEEP:  [5 cmH20] 5 cmH20   Intake/Output Summary (Last 24 hours) at 02/08/2020 0946 Last data filed at 02/08/2020 0500 Gross per 24 hour  Intake 1039.33 ml  Output 760 ml  Net 279.33 ml   Filed Weights   01/29/20 0700  Weight: 53.7 kg   General: Acute and chronically  ill-appearing woman, intubated on MV HEENT: ET tube in place, left facial droop Neuro: Opens eyes nods weakly, intermittently follows commands.  Flaccid on the left CV: Regular, distant, no murmur PULM: Significant tan secretions.  Clear, no wheeze GI: Nondistended, positive bowel sounds Extremities: Left upper extremity and left lower extremity edema Skin: No rash  Resolved Hospital Problem list   AKI  Assessment &  Plan:   Acute, 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 Stable focal petechial hemorrhage, contrast stain in the right basal ganglia. MR with multi-focal infarct.  No acute findings after episode of worsened R eye dev.  Neurology following. TTE negative for vegetations or thrombosis.  Remains intubated as mental status precludes extubation P: -ASA, statin -Neurology managing  Acute Respiratory failure secondary to above  New infiltrate and fever 10/14 P:  -She does not have the strength, mental status for extubation.  Okay to continue PSV -Support the proposed plan for transition to comfort, family to gather 10/19.  No plans to extubate, certainly before we understand goals of care, whether she would be reintubated. -Follow intermittent chest x-ray -Diuresis as tolerated  -Okay for PSV but she does not have the strength, airway protection to tolerate extubation.  I would not extubate unless we are clear that the overall plan of care, family wish is no reintubation, comfort if she fails -Follow intermittent chest x-ray -Diuresis as she can tolerate, gauge daily.  Adenocarcinoma of the lung with malignant left pleural effusion, New - CT chest 10/8 demonstrates 3.5 x 3.3 cm mass concerning for bronchogenic carcinoma. No evidence of distant metastatic disease, new finding this admission - cytology positive for malignant cells consistent with malignant Adenocarcinoma P: -Appreciate oncology input.  Poor candidate for any therapeutics due to her deconditioned state. -Chest tube output down to 60 cc over last 24 hours.  Could consider pulling, but would defer until her goals of care are clarified -Appreciate wound care input and management.  Probable withdrawal of care 10/19 after family gathers -No plans for escalation of care  Fever.  Note obtained Staph hominis on blood cultures from both 10/11 and then again on 10/14.  Often would consider  contaminant but since this was obtained twice, question clinical significance.  Was oxacillin resistant -Plan to continue empiric vancomycin, cefepime -No evidence for valvular involvement on echocardiogram 10/8, hold off on repeat or TEE given overall clinical picture  History of HTN -Continue home home medical regimen  HIV/AIDS Last CD4 count 242 in 07/2019 -Discovy and T[place]  T2DM  -Slight scale insulin as ordered -Levemir 10 units twice daily   Nutrition -Tube feeding  Hx of Breast Cancer  - initially diagnosed 2002 s/p lumpectomy and tamoxifen , recurrence 2010 with resection, XRT, Arimedex, last oncology note in 2018 from [place] anastrozole on hold   Normocytic anemia P:  H/H stable  -Follow CBC intermittently  Improved renal function Plan -Diuresis as she can tolerate, follow BMP intermittently  Best practice:  Diet: NPO/ TF Pain/Anxiety/Delirium protocol (if indicated): prn fentanyl VAP protocol (if indicated): In place  DVT prophylaxis: Lovenox GI prophylaxis: PPI  Glucose control: SSI Mobility: Bed Rest  Code Status: DNR if arrests Family Communication:   Appreciate palliative care assistance.  Looks like plan is for family gathering next week and probable withdrawal of care.  Labs   CBC: Recent Labs  Lab 02/03/20 0545 02/04/20 0540 02/05/20 0910 02/06/20 0427 02/07/20 0502  WBC 11.0* 12.8*  15.2* 16.4* 15.9*  HGB 8.7* 8.8* 8.8* 8.4* 7.6*  HCT 27.5* 27.9* 27.4* 26.0* 24.0*  MCV 89.9 89.4 89.3 90.0 89.9  PLT 208 234 308 348 149    Basic Metabolic Panel: Recent Labs  Lab 02/03/20 0545 02/04/20 0540 02/05/20 0453 02/05/20 0910 02/06/20 0427 02/07/20 0502  NA 140 140  --  139 137 139  K 3.4* 3.9  --  3.9 3.8 3.8  CL 114* 111  --  107 106 105  CO2 19* 21*  --  23 24 26   GLUCOSE 207* 208*  --  215* 158* 169*  BUN 15 16  --  14 17 17   CREATININE 0.86 0.86  --  0.81 0.83 0.92  CALCIUM 7.0* 7.3*  --  7.4* 7.4* 7.5*  MG 1.4*  1.7 1.9  --   --  2.0  PHOS 1.2* 2.5  --   --   --   --    GFR: Estimated Creatinine Clearance: 36 mL/min (by C-G formula based on SCr of 0.92 mg/dL). Recent Labs  Lab 02/02/20 2114 02/03/20 0545 02/04/20 0540 02/05/20 0910 02/06/20 0427 02/07/20 0502  WBC  --    < > 12.8* 15.2* 16.4* 15.9*  LATICACIDVEN 1.6  --   --   --   --   --    < > = values in this interval not displayed.    Liver Function Tests: No results for input(s): AST, ALT, ALKPHOS, BILITOT, PROT, ALBUMIN in the last 168 hours. No results for input(s): LIPASE, AMYLASE in the last 168 hours. No results for input(s): AMMONIA in the last 168 hours.  ABG    Component Value Date/Time   PHART 7.419 01/30/2020 1042   PCO2ART 26.5 (L) 01/30/2020 1042   PO2ART 143 (H) 01/30/2020 1042   HCO3 17.3 (L) 01/30/2020 1042   TCO2 18 (L) 01/30/2020 1042   ACIDBASEDEF 7.0 (H) 01/30/2020 1042   O2SAT 99.0 01/30/2020 1042     Coagulation Profile: No results for input(s): INR, PROTIME in the last 168 hours.  Cardiac Enzymes: No results for input(s): CKTOTAL, CKMB, CKMBINDEX, TROPONINI in the last 168 hours.  HbA1C: Hgb A1c MFr Bld  Date/Time Value Ref Range Status  01/29/2020 04:22 PM 6.5 (H) 4.8 - 5.6 % Final    Comment:    (NOTE) Pre diabetes:          5.7%-6.4%  Diabetes:              >6.4%  Glycemic control for   <7.0% adults with diabetes     CBG: Recent Labs  Lab 02/07/20 1548 02/07/20 1902 02/07/20 2317 02/08/20 0313 02/08/20 0807  GLUCAP 141* 169* 144* 168* 171*    CRITICAL CARE N/A   Baltazar Apo, MD, PhD 02/08/2020, 9:46 AM [facility] [phone] or if no answer [phone]

## 2020-02-08 NOTE — Progress Notes (Signed)
STROKE TEAM PROGRESS NOTE   INTERVAL HISTORY RN at the bedside. Pt still intubated on weaning trial, tolerating well. Eyes spontaneous open today but not consistently following commands. Still has left hemiplegia.   Vitals:   02/08/20 0400 02/08/20 0500 02/08/20 0600 02/08/20 0700  BP: (!) 125/48 (!) 117/47 (!) 105/48 (!) 136/47  Pulse: (!) 103 79 73 83  Resp: (!) 25 16 20  (!) 22  Temp: 99.1 F (37.3 C) 99.9 F (37.7 C) 99.1 F (37.3 C) 100 F (37.8 C)  TempSrc: Esophageal     SpO2: 95% 100% 100% 100%  Weight:      Height:       CBC:  Recent Labs  Lab 02/06/20 0427 02/07/20 0502  WBC 16.4* 15.9*  HGB 8.4* 7.6*  HCT 26.0* 24.0*  MCV 90.0 89.9  PLT 348 275   Basic Metabolic Panel:  Recent Labs  Lab 02/03/20 0545 02/03/20 0545 02/04/20 0540 02/04/20 0540 02/05/20 0453 02/05/20 0910 02/06/20 0427 02/07/20 0502  NA 140   < > 140  --   --    < > 137 139  K 3.4*   < > 3.9  --   --    < > 3.8 3.8  CL 114*   < > 111  --   --    < > 106 105  CO2 19*   < > 21*  --   --    < > 24 26  GLUCOSE 207*   < > 208*  --   --    < > 158* 169*  BUN 15   < > 16  --   --    < > 17 17  CREATININE 0.86   < > 0.86  --   --    < > 0.83 0.92  CALCIUM 7.0*   < > 7.3*  --   --    < > 7.4* 7.5*  MG 1.4*   < > 1.7   < > 1.9  --   --  2.0  PHOS 1.2*  --  2.5  --   --   --   --   --    < > = values in this interval not displayed.    IMAGING past 24 hours No results found.  PHYSICAL EXAM  Temp:  [97.5 F (36.4 C)-100.2 F (37.9 C)] 100 F (37.8 C) (10/17 0700) Pulse Rate:  [25-105] 83 (10/17 0700) Resp:  [16-36] 22 (10/17 0700) BP: (105-160)/(23-121) 136/47 (10/17 0700) SpO2:  [95 %-100 %] 100 % (10/17 0700) FiO2 (%):  [30 %] 30 % (10/17 0328)  General - Well nourished, well developed elderly African-American lady, intubated on ventilator  Ophthalmologic - fundi not visualized due to noncooperation.  Cardiovascular - Regular rate and rhythm.  Neuro - intubated on vent, eyes  spontaneously open today. Seems able to close eyes on commands, but not consistent, did not follow other commands. Right gaze not cross midline today. Not blinking to visual threat on the left, but blinking to visual threat on the right, not tracking, PERRL. Corneal reflex present, gag and cough present. Breathing over the vent.  Facial symmetry not able to test due to ET tube.  Tongue protrusion not cooperative. Spontaneous movement of RUE and localize to pain, withdraw RLE to pain 2/5. LUE and LLE no spontaneous movement, but slight withdraw to pain. DTR 1+ and no babinski. Sensation, coordination and gait not tested.   ASSESSMENT/PLAN Amber Lopez is a  79 y.o. female with history of depression, HLD, HIV, HTN and DM presenting with left hemiplegia, left facial droop, right eye deviation and aphasia. Received IV tPA 01/29/2020 at 0816. -> IR 01/29/20  Stroke:  R MCA and small L cerebellar infarcts s/p tPA + IR R M1 occlusion w/ TICI2c revascularization, embolic secondary to unknown source possibly hypercoagulability from lung cancer  Code Stroke CT head No acute abnormality. Atrophy. ASPECTS 10.     CTA head & neck LVO R M1. Atherosclerosis. L superior sulcus mass w/ large pleural effusion malignant appearing w/ associated mediastinal adenopathy.  Cerebral angio / IR - R M1 occlusion s/p TICI2c revascularization using Tiger x 1, embotrap x 1, solitaire x 2.  Post IR CT - no ICH, contrast stain R basal ganglia  MRI  R MCA multifocal infarct w/ mild petechial hemorrhage. Small L cerebellar infarct. Atrophy,  CT Head repeat 01/31/20 - Expected evolution of right MCA nonhemorrhagic infarct. Stable focal subarachnoid hemorrhage near the right MCA bifurcation. Stable atrophy and white matter disease on the left.  CT head 10/11 stable w/ expected evolution infarct w/ slight increase in edema. Stable R SAH. Same small vessel disease, atrophy.  2D Echo EF 55 to 60%, large pleural effusion on the  left  LDL 62  HgbA1c 6.5  VTE prophylaxis - Lovenox 40 mg sq daily   No antithrombotic prior to admission, ASA 81 mg daily started 01/31/20  Therapy recommendations:  SNF  Disposition:  pending   Acute Respiratory Failure  Secondary to stroke  Intubated for IR, remains intubated w/ full support  CXR new ill-defined opacity right upper lobe raising concern for developing pneumonia.  Small left pleural effusion with left base atelectasis and suspect neoplasm in the left medial apex.  Not a candidate for long-term trach   CCM on board  Palliative care on board, possible one way extubation 10/19  Fever and leukocytosis  TMax 101.7->100.4->100.2  Leukocytosis 11.0->12.8->15.2->16.4->15.9   Blood Cx from  101/11 +Staph   CXR 02/07/20 Suspect mass in the medial left apex. Correlation with chest CT, ideally with intravenous contrast, to further evaluate this area may well be warranted. Small left pleural effusion with bibasilar .atelectasis.  Blood cultures 10/14 - 1/2 staph hominis - sensitive to Vancomycin   Sputum Cultures 10/14 - Stenotrophomonas Maltophilia - sensitive to Levofloxacin and trimeth / sulfa  Vanc/Cefepime 10/14>>   Severe anemia  Acute blood loss anemia s/p IR groin bleeding. Sheath removed using quick clot and pressure held   Hb 13.2->9.2->8.2->6.5 . .8.8->8.8->8.4->7.6  S/p PRBC transfusion  CT abd/pelvis no retroperitoneal hemorrhage  CBC monitoring - check again Monday AM  Adenocarcinoma of lung w/ L pleural effusion  CTA neck - L superior sulcus mass w/ large pleural effusion malignant appearing w/ associated mediastinal adenopathy.  CT chest large left pleural effusion with near complete left lung collapse.  Left apical mass suspicious for bronchogenic carcinoma  CT abd/pelvis no distal metastasis disease  Lt chest tube placed 01/31/20 - Dr Thayer Jew  Cytology of pleural fluid - Malignant cells consistent with metastatic adenocarcinoma    Oncology consult - stage IV adenocarcinoma of the lung, prognosis poor, not a candidate for aggressive tx - signed off 10/15  Family has agreed to not escalate care. Now partial DNR  Palliative care on board - Continue current level of care through Tuesday October 19th at 3:30, giving family time to visit and put things in order.  I will meet with them for a probable liberation  from the vent at that time. Limited code-no compressions. No desire for Lurline Idol or PEG now or in the future   Possible Seizure 10/11  Worsening R gaze forced deviation in setting of HTN, no shaking or jerking - 10/11 am   EEG continuous slowing  Repeat CT head 02/02/2020 slight increase in infarct now involving right caudate head as well but otherwise stable mass-effect on no midline shift  Hypotension/Hypertension  Home meds:  hctz 25, metoprolol 25  Current - Labetalol prn  Off phenylephepdirine   Stable now . BP goal normotensive   Diabetes type II Controlled  Home meds:  None listed  HgbA1c 6.5, goal < 7.0  CBGs  SSI  On levimir 10 bid  DB RN following    Dysphagia At risk malnutrition . Secondary to stroke . NPO . On TF   HIV  On HARRP   CD4 in 07/2019 242  CD4 02/03/2020 185  Other Stroke Risk Factors  Advanced age  Migraines   Other Active Problems  Depression / anxiety  Hx right breast cancer 200 s/p lumpectomy and tamoxifen, recurred 2010 w/ resection, XRT, on arimidex PTA  Watery stools on miralax and colace. flexi-seal placed. Meds stopped.    Hospital day # 10  This patient is critically ill due to respiratory failure, right MCA stroke, severe anemia, lung cancer with pleural effusion, possible seizure, fever with leukocytosis and at significant risk of neurological worsening, death form sepsis, recurrent stroke, hemorrhagic conversion, status epilepticus. This patient's care requires constant monitoring of vital signs, hemodynamics, respiratory and cardiac  monitoring, review of multiple databases, neurological assessment, discussion with family, other specialists and medical decision making of high complexity. I spent 30 minutes of neurocritical care time in the care of this patient.  Rosalin Hawking, MD PhD Stroke Neurology 02/08/2020 12:00 PM  o contact Stroke Continuity provider, please refer to [email or web address] After hours, contact General Neurology

## 2020-02-09 DIAGNOSIS — Z9689 Presence of other specified functional implants: Secondary | ICD-10-CM | POA: Diagnosis not present

## 2020-02-09 DIAGNOSIS — I63511 Cerebral infarction due to unspecified occlusion or stenosis of right middle cerebral artery: Secondary | ICD-10-CM | POA: Diagnosis not present

## 2020-02-09 DIAGNOSIS — C3492 Malignant neoplasm of unspecified part of left bronchus or lung: Secondary | ICD-10-CM | POA: Diagnosis not present

## 2020-02-09 DIAGNOSIS — I6601 Occlusion and stenosis of right middle cerebral artery: Secondary | ICD-10-CM | POA: Diagnosis not present

## 2020-02-09 DIAGNOSIS — R0682 Tachypnea, not elsewhere classified: Secondary | ICD-10-CM

## 2020-02-09 LAB — BASIC METABOLIC PANEL
Anion gap: 8 (ref 5–15)
BUN: 19 mg/dL (ref 8–23)
CO2: 25 mmol/L (ref 22–32)
Calcium: 7.7 mg/dL — ABNORMAL LOW (ref 8.9–10.3)
Chloride: 104 mmol/L (ref 98–111)
Creatinine, Ser: 0.83 mg/dL (ref 0.44–1.00)
GFR, Estimated: >60 mL/min (ref >60)
Glucose, Bld: 166 mg/dL — ABNORMAL HIGH (ref 70–99)
Potassium: 4.3 mmol/L (ref 3.5–5.1)
Sodium: 137 mmol/L (ref 135–145)

## 2020-02-09 LAB — GLUCOSE, CAPILLARY
Glucose-Capillary: 105 mg/dL — ABNORMAL HIGH (ref 70–99)
Glucose-Capillary: 118 mg/dL — ABNORMAL HIGH (ref 70–99)
Glucose-Capillary: 161 mg/dL — ABNORMAL HIGH (ref 70–99)
Glucose-Capillary: 169 mg/dL — ABNORMAL HIGH (ref 70–99)
Glucose-Capillary: 169 mg/dL — ABNORMAL HIGH (ref 70–99)
Glucose-Capillary: 185 mg/dL — ABNORMAL HIGH (ref 70–99)

## 2020-02-09 LAB — CBC
HCT: 24.2 % — ABNORMAL LOW (ref 36.0–46.0)
Hemoglobin: 7.6 g/dL — ABNORMAL LOW (ref 12.0–15.0)
MCH: 28.8 pg (ref 26.0–34.0)
MCHC: 31.4 g/dL (ref 30.0–36.0)
MCV: 91.7 fL (ref 80.0–100.0)
Platelets: 430 10*3/uL — ABNORMAL HIGH (ref 150–400)
RBC: 2.64 MIL/uL — ABNORMAL LOW (ref 3.87–5.11)
RDW: 15.1 % (ref 11.5–15.5)
WBC: 14.2 10*3/uL — ABNORMAL HIGH (ref 4.0–10.5)
nRBC: 0.1 % (ref 0.0–0.2)

## 2020-02-09 MED ORDER — FUROSEMIDE 10 MG/ML IJ SOLN
40.0000 mg | Freq: Once | INTRAMUSCULAR | Status: AC
  Administered 2020-02-09: 40 mg via INTRAVENOUS
  Filled 2020-02-09: qty 4

## 2020-02-09 MED ORDER — POTASSIUM CHLORIDE 20 MEQ/15ML (10%) PO SOLN
40.0000 meq | Freq: Once | ORAL | Status: AC
  Administered 2020-02-09: 40 meq
  Filled 2020-02-09: qty 30

## 2020-02-09 NOTE — Progress Notes (Signed)
NAME:  Amber Lopez, MRN:  [id number], DOB:  1940-10-04, LOS: 15 ADMISSION DATE:  01/29/2020, CONSULTATION DATE:  10/7 REFERRING MD: Dr. Estanislado Pandy, CHIEF COMPLAINT: Left Sided Weakness  Brief History   79 y/o F, with HIV, admitted 10/7 with LVO of R M1 s/p tPA, neuro IR revascularization.  Returned to ICU post procedure on mechanical ventilation. Additional finding of left superior sulcus mass and complex effusion.   History of present illness   79 y/o F who presented to [facility] on 10/7 with acute onset left sided weakness, facial droop, aphasia and right eye deviation. She was last known normal at 0640 when her daughter assisted her with am medications.  The patient was in bed but reportedly normal at that time.  Her daughter left and returned home finding her mother with weakness on the left side and unable to speak.  EMS was activated and CODE STROKE was activated in the field.  On arrival to the ER the patient was documented to have left sided hemiparesis, left facial droop, non-verbal and right gaze deviation. Initial CT of the head was negative for acute findings.  CTA head/neck demonstrated  Past Medical History  HIV/AIDS, HTN, HLD, DM, Depression , Arthritis, Breast Cancer   Significant Hospital Events   10/07 Admit with L hemiplegia, facial droop, aphasia with right eye deviation 10/11 Episode of worsened R eye deviation with tachycardia and HTN, concern for sz  Consults:  Neurology, IR  Oncology 10/13 Palliative care 10/13  Procedures:  ETT 10/7 >> L Radial ALine 10/7 >>  out R Femoral Sheath 10/7 >> out  Significant Diagnostic Tests:  10/7 CT Head Code Stroke >> no acute finding, generalized atrophy  10/7 CTA Head/Neck >> emergent LVO at the right M1 segment, no visible embolic source, atherosclerosis without flow limiting stenosis or ulceration of major vessels, left superior sulcus mass with large complex pleural effusion, associated mediastinal adenopathy  10/7 CT ABD  w/contrast >> Negative for hematoma 10/7 CT Chest w/contrast >> Persistent large left pleural effusion with near complete left lung collapse. Left apical mass measuring 3.5 x 3.3 cm suspicious for bronchogenic carcinoma 10/11 CT head>>no acute findings 10/11 EEG>>Continuous slow, generalized and maximal right frontotemporal region  Micro Data:  COVID 10/7 >> negative  Influenza A/B 10/7 >> negative Pleural fluid culture>>no growth three days 10/9 BCx2>> 1/2 with gram positives 10/7 MRSA screen>>negative 10/11 BC x 1 (from left PICC) > staph hominis 1/2 10/14 trach asp >> few WBC, rare G+ cocci>> few stenotrophomonas  10/14 BC >> gram + cocci>> contaminent  Antimicrobials:  10/7 cefazolin 10/8 dolutegravir/ emtricitabine/ tenofovir >> 10/14 vanc >> 10/14 cefepime >>  Interim history/subjective:  No acute events.  Minimal output from chest tube  Objective   Blood pressure (!) 123/42, pulse 92, temperature 100.2 F (37.9 C), resp. rate (!) 23, height 4\' 10"  (1.473 m), weight 53.7 kg, SpO2 100 %.    Vent Mode: PSV;CPAP FiO2 (%):  [30 %] 30 % Set Rate:  [16 bmp] 16 bmp Vt Set:  [330 mL] 330 mL PEEP:  [5 cmH20] 5 cmH20 Pressure Support:  [5 cmH20] 5 cmH20 Plateau Pressure:  [20 cmH20-21 cmH20] 20 cmH20   Intake/Output Summary (Last 24 hours) at 02/09/2020 1002 Last data filed at 02/09/2020 0600 Gross per 24 hour  Intake 1915.66 ml  Output 1265 ml  Net 650.66 ml   Filed Weights   01/29/20 0700  Weight: 53.7 kg   General: Acute and chronically ill-appearing woman, intubated  on MV HEENT: ET tube in place, left facial droop Neuro: Opens eyes but not following commands. Flaccid on the left CV: Regular, distant, no murmur PULM: Clear, no wheeze GI: Nondistended, positive bowel sounds Extremities: Left upper extremity and left lower extremity edema Skin: No rash   Assessment & Plan:   Acute, embolic CVA in the R MCA and L cerebellum  S/P tPA and endovascular  revascularization. Stable focal petechial hemorrhage, contrast stain in the right basal ganglia. MRI with multi-focal infarct.  No acute findings after episode of worsened R eye dev.  Neurology following. TTE negative for vegetations or thrombosis.  Remains intubated as mental status precludes extubation P: -ASA, statin -Neurology managing  Acute Respiratory failure secondary to above  P:  -She does not have the strength, mental status for extubation.  Okay to continue PSV -Support the proposed plan for transition to comfort, family to gather 10/19.  No plans to extubate until family discussion can be had (ie comfort care / terminal extubation) -Follow intermittent chest x-ray -Diuresis as tolerated  Adenocarcinoma of the lung with malignant left pleural effusion - New this admit - CT chest 10/8 demonstrates 3.5 x 3.3 cm mass concerning for bronchogenic carcinoma. No evidence of distant metastatic disease, new finding this admission - cytology positive for malignant cells consistent with malignant Adenocarcinoma P: -Appreciate oncology input.  Poor candidate for any therapeutics due to her deconditioned state. -Chest tube output down to 40 cc over last 24 hours.  Could consider pulling, but would defer until her goals of care are clarified -Appreciate wound care input and management.  Probable withdrawal of care 10/19 after family gathers -No plans for escalation of care  Staph hominis on blood cultures from both 10/11 and then again on 10/14.  Often would consider contaminant but since this was obtained twice, question clinical significance.  Was oxacillin resistant -Plan to continue empiric vancomycin, cefepime for now -No evidence for valvular involvement on echocardiogram 10/8, hold off on repeat or TEE given overall clinical picture  History of HTN -Continue home home medical regimen  HIV/AIDS Last CD4 count 242 in 07/2019 -Discovy and Tivicay  T2DM  -SSI as ordered -Levemir 10  units twice daily   Nutrition -Tube feeding  Hx of Breast Cancer  - initially diagnosed 2002 s/p lumpectomy and tamoxifen , recurrence 2010 with resection, XRT, Arimedex, last oncology note in 2018 from [facility] anastrozole on hold   Normocytic anemia P:  H/H stable  -Follow CBC intermittently  Improved renal function Plan -Diuresis as she can tolerate, follow BMP intermittently  Best practice:  Diet: NPO/ TF Pain/Anxiety/Delirium protocol (if indicated): prn fentanyl VAP protocol (if indicated): In place  DVT prophylaxis: Lovenox GI prophylaxis: PPI  Glucose control: SSI Mobility: Bed Rest  Code Status: DNR if arrests Family Communication:   Appreciate palliative care assistance.  Looks like plan is for family gathering 10/19 and probable withdrawal of care.   Montey Hora, [facility] 02/09/2020, 10:23 AM

## 2020-02-09 NOTE — Progress Notes (Signed)
STROKE TEAM PROGRESS NOTE   INTERVAL HISTORY No family at the bedside. Pt still intubated on vent. Eyes spontaneous open with right gaze not following commands. Still has low grade fever at 100.2, WBC trending down, and Hb stable at 7.6.    Vitals:   02/09/20 0400 02/09/20 0500 02/09/20 0600 02/09/20 0741  BP: (!) 132/50 (!) 122/43 (!) 123/42   Pulse: 82 84 92   Resp: 20 (!) 21 (!) 23   Temp: 99.9 F (37.7 C) 100 F (37.8 C) 100.2 F (37.9 C)   TempSrc: Esophageal     SpO2: 100% 100% 100% 100%  Weight:      Height:       CBC:  Recent Labs  Lab 02/07/20 0502 02/09/20 0632  WBC 15.9* 14.2*  HGB 7.6* 7.6*  HCT 24.0* 24.2*  MCV 89.9 91.7  PLT 354 295*   Basic Metabolic Panel:  Recent Labs  Lab 02/03/20 0545 02/03/20 0545 02/04/20 0540 02/04/20 0540 02/05/20 0453 02/05/20 0910 02/07/20 0502 02/09/20 0632  NA 140   < > 140  --   --    < > 139 137  K 3.4*   < > 3.9  --   --    < > 3.8 4.3  CL 114*   < > 111  --   --    < > 105 104  CO2 19*   < > 21*  --   --    < > 26 25  GLUCOSE 207*   < > 208*  --   --    < > 169* 166*  BUN 15   < > 16  --   --    < > 17 19  CREATININE 0.86   < > 0.86  --   --    < > 0.92 0.83  CALCIUM 7.0*   < > 7.3*  --   --    < > 7.5* 7.7*  MG 1.4*   < > 1.7   < > 1.9  --  2.0  --   PHOS 1.2*  --  2.5  --   --   --   --   --    < > = values in this interval not displayed.    IMAGING past 24 hours No results found.  PHYSICAL EXAM     Temp:  [99 F (37.2 C)-100.2 F (37.9 C)] 100.2 F (37.9 C) (10/18 0600) Pulse Rate:  [79-95] 92 (10/18 0600) Resp:  [20-35] 23 (10/18 0600) BP: (96-143)/(42-78) 123/42 (10/18 0600) SpO2:  [100 %] 100 % (10/18 0741) FiO2 (%):  [30 %] 30 % (10/18 0741)  General - Well nourished, well developed, intubated on ventilator  Ophthalmologic - fundi not visualized due to noncooperation.  Cardiovascular - Regular rhythm with mild tachycardia.  Neuro - intubated on vent, eyes spontaneously open. Did not  follow commands. Right gaze preference but able to cross midline. Not blinking to visual threat on the left, but blinking to visual threat on the right, not tracking, PERRL. Corneal reflex present, gag and cough present. Breathing over the vent.  Facial symmetry not able to test due to ET tube.  Tongue protrusion not cooperative. Spontaneous movement of RUE and localize to pain, withdraw RLE to pain 2+/5. LUE and LLE no spontaneous movement, but slight withdraw to pain. DTR 1+ and no babinski. Sensation, coordination and gait not tested.   ASSESSMENT/PLAN Amber Lopez is a 79 y.o. female with history  of depression, HLD, HIV, HTN and DM presenting with left hemiplegia, left facial droop, right eye deviation and aphasia. Received IV tPA 01/29/2020 at 0816. -> IR 01/29/20  Stroke:  R MCA and small L cerebellar infarcts s/p tPA + IR R M1 occlusion w/ TICI2c revascularization, embolic secondary to unknown source possibly hypercoagulability from lung cancer  Code Stroke CT head No acute abnormality. Atrophy. ASPECTS 10.     CTA head & neck LVO R M1. Atherosclerosis. L superior sulcus mass w/ large pleural effusion malignant appearing w/ associated mediastinal adenopathy.  Cerebral angio / IR - R M1 occlusion s/p TICI2c revascularization using Tiger x 1, embotrap x 1, solitaire x 2.  Post IR CT - no ICH, contrast stain R basal ganglia  MRI  R MCA multifocal infarct w/ mild petechial hemorrhage. Small L cerebellar infarct. Atrophy,  CT Head repeat 01/31/20 - Expected evolution of right MCA nonhemorrhagic infarct. Stable focal subarachnoid hemorrhage near the right MCA bifurcation. Stable atrophy and white matter disease on the left.  CT head 10/11 stable w/ expected evolution infarct w/ slight increase in edema. Stable R SAH. Same small vessel disease, atrophy.  2D Echo EF 55 to 60%, large pleural effusion on the left  LDL 62  HgbA1c 6.5  VTE prophylaxis - Lovenox 40 mg sq daily   No  antithrombotic prior to admission, ASA 81 mg daily started 01/31/20  Therapy recommendations:  SNF  Disposition:  pending   Acute Respiratory Failure  Secondary to stroke  Intubated for IR, remains intubated w/ full support  CXR new ill-defined opacity right upper lobe raising concern for developing pneumonia.  Small left pleural effusion with left base atelectasis and suspect neoplasm in the left medial apex.  Not a candidate for long-term trach   CCM on board  Palliative care on board, possible one way extubation 10/19  Fever and leukocytosis  TMax 101.7->100.4->100.2  Leukocytosis 11.0->12.8->15.2->16.4->15.9->14.2   Blood Cx from  101/11 +Staph hominis  CXR 02/07/20 Suspect mass in the medial left apex. Correlation with chest CT, ideally with intravenous contrast, to further evaluate this area may well be warranted. Small left pleural effusion with bibasilar .atelectasis.  Blood cultures 10/14 - 1/2 staph hominis - sensitive to Vancomycin   Sputum Cultures 10/14 - Stenotrophomonas Maltophilia - sensitive to Levofloxacin and trimeth / sulfa  Vanc/Cefepime 10/14>>   Severe anemia  Acute blood loss anemia s/p IR groin bleeding. Sheath removed using quick clot and pressure held   Hb 13.2->9.2->8.2->6.5 . .8.8->8.8->8.4->7.6->7.6  S/p PRBC transfusion  CT abd/pelvis no retroperitoneal hemorrhage  CBC monitoring   Adenocarcinoma of lung w/ L pleural effusion  CTA neck - L superior sulcus mass w/ large pleural effusion malignant appearing w/ associated mediastinal adenopathy.  CT chest large left pleural effusion with near complete left lung collapse.  Left apical mass suspicious for bronchogenic carcinoma  CT abd/pelvis no distal metastasis disease  Lt chest tube placed 01/31/20 - Dr Thayer Jew  Cytology of pleural fluid - Malignant cells consistent with metastatic adenocarcinoma   Oncology consult - stage IV adenocarcinoma of the lung, prognosis poor, not a  candidate for aggressive tx - signed off 10/15  Family has agreed to not escalate care. Now partial DNR  Palliative care on board - Continue current level of care through Tuesday October 19th at 3:30, giving family time to visit and put things in order.  Will meet with them for a probable liberation from the vent at that time. Limited code-no compressions. No  desire for Lurline Idol or PEG now or in the future   Possible Seizure 10/11  Worsening R gaze forced deviation in setting of HTN, no shaking or jerking - 10/11 am   EEG continuous slowing  Repeat CT head 02/02/2020 slight increase in infarct now involving right caudate head as well but otherwise stable mass-effect on no midline shift  Hypotension/Hypertension  Home meds:  hctz 25, metoprolol 25  Current - Labetalol prn  Off phenylephepdirine   Stable now . BP goal normotensive   Diabetes type II Controlled  Home meds:  None listed  HgbA1c 6.5, goal < 7.0  CBGs  SSI  On levimir 10 bid  DB RN following    Dysphagia At risk malnutrition . Secondary to stroke . NPO . On TF   HIV  On HARRP   CD4 in 07/2019 242  CD4 02/03/2020 185  Other Stroke Risk Factors  Advanced age  Migraines   Other Active Problems  Depression / anxiety  Hx right breast cancer 200 s/p lumpectomy and tamoxifen, recurred 2010 w/ resection, XRT, on arimidex PTA  Watery stools on miralax and colace. flexi-seal placed. Meds stopped.    Hospital day # 11  This patient is critically ill due to right MCA stroke, severe anemia, respiratory failure, lung cancer with pleural effusion, fever with leukocytosis, possible seizure and at significant risk of neurological worsening, death form recurrent stroke, sepsis, hemorrhagic conversion, status epilepticus. This patient's care requires constant monitoring of vital signs, hemodynamics, respiratory and cardiac monitoring, review of multiple databases, neurological assessment, discussion with  family, other specialists and medical decision making of high complexity. I spent 30 minutes of neurocritical care time in the care of this patient.    Rosalin Hawking, MD PhD Stroke Neurology 02/09/2020 11:34 AM  o contact Stroke Continuity provider, please refer to [email or web address] After hours, contact General Neurology

## 2020-02-10 ENCOUNTER — Inpatient Hospital Stay (HOSPITAL_COMMUNITY): Payer: Medicare Other

## 2020-02-10 DIAGNOSIS — Z515 Encounter for palliative care: Secondary | ICD-10-CM | POA: Diagnosis not present

## 2020-02-10 DIAGNOSIS — Z9689 Presence of other specified functional implants: Secondary | ICD-10-CM | POA: Diagnosis not present

## 2020-02-10 DIAGNOSIS — I63511 Cerebral infarction due to unspecified occlusion or stenosis of right middle cerebral artery: Secondary | ICD-10-CM | POA: Diagnosis not present

## 2020-02-10 DIAGNOSIS — J90 Pleural effusion, not elsewhere classified: Secondary | ICD-10-CM | POA: Diagnosis not present

## 2020-02-10 DIAGNOSIS — R509 Fever, unspecified: Secondary | ICD-10-CM

## 2020-02-10 DIAGNOSIS — Z978 Presence of other specified devices: Secondary | ICD-10-CM | POA: Diagnosis not present

## 2020-02-10 DIAGNOSIS — C3492 Malignant neoplasm of unspecified part of left bronchus or lung: Secondary | ICD-10-CM | POA: Diagnosis not present

## 2020-02-10 DIAGNOSIS — D72829 Elevated white blood cell count, unspecified: Secondary | ICD-10-CM

## 2020-02-10 LAB — CULTURE, BLOOD (ROUTINE X 2)
Culture: NO GROWTH
Special Requests: ADEQUATE

## 2020-02-10 LAB — GLUCOSE, CAPILLARY
Glucose-Capillary: 135 mg/dL — ABNORMAL HIGH (ref 70–99)
Glucose-Capillary: 130 mg/dL — ABNORMAL HIGH (ref 70–99)
Glucose-Capillary: 85 mg/dL (ref 70–99)
Glucose-Capillary: 142 mg/dL — ABNORMAL HIGH (ref 70–99)
Glucose-Capillary: 145 mg/dL — ABNORMAL HIGH (ref 70–99)
Glucose-Capillary: 197 mg/dL — ABNORMAL HIGH (ref 70–99)

## 2020-02-10 MED ORDER — MIDAZOLAM HCL 2 MG/2ML IJ SOLN
5.0000 mg | Freq: Once | INTRAMUSCULAR | Status: AC
  Administered 2020-02-11: 5 mg via INTRAVENOUS
  Filled 2020-02-10: qty 6

## 2020-02-10 MED ORDER — VECURONIUM BROMIDE 10 MG IV SOLR
10.0000 mg | Freq: Once | INTRAVENOUS | Status: AC
  Administered 2020-02-11: 10 mg via INTRAVENOUS
  Filled 2020-02-10: qty 10

## 2020-02-10 MED ORDER — WHITE PETROLATUM EX OINT
TOPICAL_OINTMENT | CUTANEOUS | Status: AC
  Filled 2020-02-10: qty 28.35

## 2020-02-10 MED ORDER — FENTANYL CITRATE (PF) 100 MCG/2ML IJ SOLN
200.0000 ug | Freq: Once | INTRAMUSCULAR | Status: AC
  Administered 2020-02-11: 200 ug via INTRAVENOUS
  Filled 2020-02-10: qty 4

## 2020-02-10 MED ORDER — ETOMIDATE 2 MG/ML IV SOLN
40.0000 mg | Freq: Once | INTRAVENOUS | Status: DC
  Filled 2020-02-10: qty 20

## 2020-02-10 NOTE — Progress Notes (Incomplete)
   Palliative Medicine Inpatient Follow Up Note HPI:   Today's Discussion (*02-10-20 ):         Discussed the importance of continued conversation with family and their  medical providers regarding overall plan of care and treatment options, ensuring decisions are within the context of the patients values and GOCs.   Questions and concerns addressed   Subjective Assessment:  Vital Signs Vitals:   02/10/20 1500 02/10/20 1517  BP: (!) 119/53   Pulse:    Resp: 20   Temp: 98.4 F (36.9 C)   SpO2:  100%    Intake/Output Summary (Last 24 hours) at 02/10/2020 1607 Last data filed at 02/10/2020 1400 Gross per 24 hour  Intake 1659.59 ml  Output 1460 ml  Net 199.59 ml   Last Weight  Most recent update: 01/29/2020  7:57 AM   Weight  53.7 kg (118 lb 6.2 oz)            Gen:  NAD HEENT: moist mucous membranes CV: Regular rate and rhythm, no murmurs rubs or gallops PULM: clear to auscultation bilaterally. No wheezes/rales/rhonchi*** ABD: soft/nontender/nondistended/normal bowel sounds*** EXT: No edema*** Neuro: Alert and oriented x3***  Recommendations and Plan:  Discussed with Dr. Dr Erlinda Hong   Time In: Time Out: Time Spent: ** Greater than 50% of the time was spent in counseling and coordination of care ______________________________________________________________________________________ [place] Team Team Cell Phone: [phone] Please utilize secure chat with additional questions, if there is no response within 30 minutes please call the above phone number  Palliative Medicine Team providers are available by phone from 7am to 7pm daily and can be reached through the team cell phone.  Should this patient require assistance outside of these hours, please call the patient's attending physician.   Patient ID: Amber Lopez, female   DOB: April 06, 1941, 79 y.o.   MRN: [id number]

## 2020-02-10 NOTE — Progress Notes (Signed)
Physical Therapy Treatment Patient Details Name: Amber Lopez MRN: [id number] DOB: 1941/02/24 Today's Date: 02/10/2020    History of Present Illness 79 y.o. female with a PMHx of depression, HLD, HIV, HTN and DM presenting with acute onset of left hemiplegia, left facial droop, right eye deviation and aphasia. Pt received IV tPA and underwent R MCA revacularization on 01/29/2020. Pt also found to have large left pleural effusion with near complete L lung collapse and L apical mass. Chest tube placed on 01/31/2020.    PT Comments    Patient continues to be lethargic with decreased arousal during session. Right gaze preference noted but able to get to midline with max cues. Placed pt in chair position in bed and worked on trunk rotation/activation as well as cervical AROM/stretching. Able to follow <25% of commands today and needing stimulus for arousal. RR up to 41 with activity. Pt with ETT on 30% Fi02, PEEP 5. Family meeting taking place today per POC. Will follow.    Follow Up Recommendations  SNF     Equipment Recommendations  Wheelchair (measurements PT);Wheelchair cushion (measurements PT);Hospital bed    Recommendations for Other Services       Precautions / Restrictions Precautions Precautions: Fall Precaution Comments: chest tube, ETT, flexi Restrictions Weight Bearing Restrictions: No    Mobility  Bed Mobility               General bed mobility comments: Pt placed in bed egress position with total A of bed to help with improved arousal and participation.  Transfers                    Ambulation/Gait                 Stairs             Wheelchair Mobility    Modified Rankin (Stroke Patients Only) Modified Rankin (Stroke Patients Only) Pre-Morbid Rankin Score: Slight disability Modified Rankin: Severe disability     Balance Overall balance assessment: Needs assistance Sitting-balance support: Feet unsupported;Bilateral upper  extremity supported Sitting balance-Leahy Scale: Zero Sitting balance - Comments: Worked on coming forward off back of bed using sheet and total A. Performed trunk rotation AROM/stretches and cervical stretches/AROM.                                    Cognition Arousal/Alertness: Awake/alert Behavior During Therapy: Flat affect Overall Cognitive Status: Difficult to assess Area of Impairment: Following commands                       Following Commands: Follows one step commands inconsistently     Problem Solving: Slow processing;Requires verbal cues;Requires tactile cues General Comments: following one step commands approx 25% of the time; right gaze preference, however able to get to midline with max cues.      Exercises General Exercises - Lower Extremity Long Arc Quad: AROM;AAROM;Right;5 reps;Seated    General Comments General comments (skin integrity, edema, etc.): Pt with ETT, 30% Fi02, PEEP 5; RR up to 41 during session.      Pertinent Vitals/Pain Pain Assessment: Faces Faces Pain Scale: No hurt Pain Intervention(s): Monitored during session    Home Living                      Prior Function  PT Goals (current goals can now be found in the care plan section) Progress towards PT goals: Progressing toward goals (slowly)    Frequency    Min 3X/week      PT Plan Current plan remains appropriate    Co-evaluation PT/OT/SLP Co-Evaluation/Treatment: Yes Reason for Co-Treatment: Complexity of the patient's impairments (multi-system involvement);Necessary to address cognition/behavior during functional activity;To address functional/ADL transfers PT goals addressed during session: Mobility/safety with mobility;Strengthening/ROM OT goals addressed during session: ADL's and self-care      AM-PAC PT "6 Clicks" Mobility   Outcome Measure  Help needed turning from your back to your side while in a flat bed without using  bedrails?: Total Help needed moving from lying on your back to sitting on the side of a flat bed without using bedrails?: Total Help needed moving to and from a bed to a chair (including a wheelchair)?: Total Help needed standing up from a chair using your arms (e.g., wheelchair or bedside chair)?: Total Help needed to walk in hospital room?: Total Help needed climbing 3-5 steps with a railing? : Total 6 Click Score: 6    End of Session Equipment Utilized During Treatment: Oxygen (ETT, 30% Fi02 PEEP5) Activity Tolerance: Patient limited by fatigue;Patient limited by lethargy Patient left: in bed;with call bell/phone within reach;with bed alarm set;with SCD's reapplied (bed in chair position) Nurse Communication: Mobility status;Need for lift equipment PT Visit Diagnosis: Other abnormalities of gait and mobility (R26.89);Muscle weakness (generalized) (M62.81);Hemiplegia and hemiparesis;Other symptoms and signs involving the nervous system (R29.898) Hemiplegia - Right/Left: Left Hemiplegia - dominant/non-dominant: Non-dominant Hemiplegia - caused by: Cerebral infarction     Time: 4665-9935 PT Time Calculation (min) (ACUTE ONLY): 23 min  Charges:  $Therapeutic Activity: 8-22 mins                     Marisa Severin, PT, DPT Acute Rehabilitation Services Pager [phone] Office [place] 02/10/2020, 12:34 PM

## 2020-02-10 NOTE — Progress Notes (Signed)
Occupational Therapy Treatment Patient Details Name: Amber Lopez MRN: [id number] DOB: 1940-07-02 Today's Date: 02/10/2020    History of present illness 79 y.o. female with a PMHx of depression, HLD, HIV, HTN and DM presenting with acute onset of left hemiplegia, left facial droop, right eye deviation and aphasia. Pt received IV tPA and underwent R MCA revacularization on 01/29/2020. Pt also found to have large left pleural effusion with near complete L lung collapse and L apical mass. Chest tube placed on 01/31/2020.   OT comments  Pt awake for majority of session, following some basic commands (approx 25% of simple commands) and continues to present with R gaze preference. Use of bed egress to chair position to optimize being upright and to increase arousal levels. Pt continues to require two person assist for transitioning trunk away from [facility] (totalA+2) and to facilitate truncal movements including stretching into trunk extension and to facilitate trunk rotation. No active movement noted to LUE during session. Pt intermittently with elevated RR (max noted 40) while on vent (PEEP 5, FiO2 30%). BP start of session 116/47, initially with bed egress 98/53, with increased time (approx 5 min) in chair position 112/51. Will continue per POC at this time.    Follow Up Recommendations  SNF    Equipment Recommendations  None recommended by OT          Precautions / Restrictions Precautions Precautions: Fall Precaution Comments: chest tube, ETT, flexi Restrictions Weight Bearing Restrictions: No       Mobility Bed Mobility Overal bed mobility: Needs Assistance             General bed mobility comments: Pt placed in bed egress position with total A of bed to help with improved arousal and participation.  Transfers                      Balance Overall balance assessment: Needs assistance Sitting-balance support: Feet unsupported;Bilateral upper extremity supported Sitting  balance-Leahy Scale: Zero Sitting balance - Comments: Worked on coming forward off back of bed using sheet and total A. Performed trunk rotation AROM/stretches and cervical stretches/AROM.                                   ADL either performed or assessed with clinical judgement   ADL Overall ADL's : Needs assistance/impaired                                       General ADL Comments: attempted hand over hand using RUE for simple face washing task, pt resistant to action/movement of bringing washcloth towards her face. ultimately requires totalA for ADL at this time      Vision   Additional Comments: pt continues to present with R gaze preference. with cervical rotation pt reaching/maintaining midline for brief period    Perception     Praxis      Cognition Arousal/Alertness: Awake/alert Behavior During Therapy: Flat affect Overall Cognitive Status: Difficult to assess Area of Impairment: Following commands                       Following Commands: Follows one step commands inconsistently     Problem Solving: Slow processing;Requires verbal cues;Requires tactile cues General Comments: following one step commands approx 25% of the time; right gaze  preference, however able to get to midline with max cues.        Exercises General Exercises - Lower Extremity Long Arc Quad: AROM;AAROM;Right;5 reps;Seated   Shoulder Instructions       General Comments Pt with ETT, 30% Fi02, PEEP 5; RR up to 41 during session.    Pertinent Vitals/ Pain       Pain Assessment: Faces Faces Pain Scale: No hurt Pain Intervention(s): Monitored during session  Home Living                                          Prior Functioning/Environment              Frequency  Min 2X/week        Progress Toward Goals  OT Goals(current goals can now be found in the care plan section)  Progress towards OT goals: OT to reassess next  treatment  Acute Rehab OT Goals Patient Stated Goal: Pt unable to state  OT Goal Formulation: With patient Time For Goal Achievement: 02/18/20 Potential to Achieve Goals: Good (for goals) ADL Goals Pt Will Perform Grooming: with min assist;sitting Pt Will Perform Upper Body Bathing: with mod assist;sitting Pt Will Transfer to Toilet: with max assist;with +2 assist;bedside commode Additional ADL Goal #1: Pt will locate ADL items at midline with min cues Additional ADL Goal #2: Family will be independent with PROM/positioning Lt UE  Plan Discharge plan remains appropriate    Co-evaluation    PT/OT/SLP Co-Evaluation/Treatment: Yes Reason for Co-Treatment: Complexity of the patient's impairments (multi-system involvement);For patient/therapist safety;To address functional/ADL transfers PT goals addressed during session: Mobility/safety with mobility;Strengthening/ROM OT goals addressed during session: ADL's and self-care      AM-PAC OT "6 Clicks" Daily Activity     Outcome Measure   Help from another person eating meals?: Total Help from another person taking care of personal grooming?: A Lot Help from another person toileting, which includes using toliet, bedpan, or urinal?: Total Help from another person bathing (including washing, rinsing, drying)?: Total Help from another person to put on and taking off regular upper body clothing?: Total Help from another person to put on and taking off regular lower body clothing?: Total 6 Click Score: 7    End of Session Equipment Utilized During Treatment: Oxygen  OT Visit Diagnosis: Hemiplegia and hemiparesis Hemiplegia - Right/Left: Left Hemiplegia - dominant/non-dominant: Non-Dominant Hemiplegia - caused by: Cerebral infarction   Activity Tolerance Patient tolerated treatment well   Patient Left in bed;with call bell/phone within reach   Nurse Communication Mobility status        Time: 3818-2993 OT Time Calculation (min):  23 min  Charges: OT General Charges $OT Visit: 1 Visit OT Treatments $Self Care/Home Management : 8-22 mins  Lou Cal, OT Acute Rehabilitation Services Pager [phone] Office [phone]   Raymondo Band 02/10/2020, 1:46 PM

## 2020-02-10 NOTE — Progress Notes (Signed)
NAME:  Amber Lopez, MRN:  [id number], DOB:  [birth year], LOS: 36 ADMISSION DATE:  01/29/2020, CONSULTATION DATE:  10/7 REFERRING MD: Dr. Estanislado Pandy, CHIEF COMPLAINT: Left Sided Weakness  Brief History   79 y/o F, with HIV, admitted 10/7 with LVO of R M1 s/p tPA, neuro IR revascularization.  Returned to ICU post procedure on mechanical ventilation. Additional finding of left superior sulcus mass and complex effusion.   History of present illness   79 y/o F who presented to [facility] on 10/7 with acute onset left sided weakness, facial droop, aphasia and right eye deviation. She was last known normal at 0640 when her daughter assisted her with am medications.  The patient was in bed but reportedly normal at that time.  Her daughter left and returned home finding her mother with weakness on the left side and unable to speak.  EMS was activated and CODE [employer]OKE was activated in the field.  On arrival to the ER the patient was documented to have left sided hemiparesis, left facial droop, non-verbal and right gaze deviation. Initial CT of the head was negative for acute findings.  CTA head/neck demonstrated  Past Medical History  HIV/AIDS, HTN, HLD, DM, Depression , Arthritis, Breast Cancer   Significant Hospital Events   10/07 Admit with L hemiplegia, facial droop, aphasia with right eye deviation 10/11 Episode of worsened R eye deviation with tachycardia and HTN, concern for sz  Consults:  Neurology, IR  Oncology 10/13 Palliative care 10/13  Procedures:  ETT 10/7 >> L Radial ALine 10/7 >>  out R Femoral Sheath 10/7 >> out  Significant Diagnostic Tests:  10/7 CT Head Code Stroke >> no acute finding, generalized atrophy  10/7 CTA Head/Neck >> emergent LVO at the right M1 segment, no visible embolic source, atherosclerosis without flow limiting stenosis or ulceration of major vessels, left superior sulcus mass with large complex pleural effusion, associated mediastinal adenopathy  10/7 CT ABD  w/contrast >> Negative for hematoma 10/7 CT Chest w/contrast >> Persistent large left pleural effusion with near complete left lung collapse. Left apical mass measuring 3.5 x 3.3 cm suspicious for bronchogenic carcinoma 10/11 CT head>>no acute findings 10/11 EEG>>Continuous slow, generalized and maximal right frontotemporal region  Micro Data:  COVID 10/7 >> negative  Influenza A/B 10/7 >> negative Pleural fluid culture>>no growth three days 10/9 BCx2>> 1/2 with gram positives 10/7 MRSA screen>>negative 10/11 BC x 1 (from left PICC) > staph hominis 1/2 10/14 trach asp >> few WBC, rare G+ cocci>> few stenotrophomonas  10/14 BC >> gram + cocci>> contaminent  Antimicrobials:  10/7 cefazolin 10/8 dolutegravir/ emtricitabine/ tenofovir >> 10/14 vanc >> 10/14 cefepime >>  Interim history/subjective:  No acute events. Family coming in around 330pm today for probable one way extubation.  Objective   Blood pressure 127/63, pulse 83, temperature 99.9 F (37.7 C), temperature source Esophageal, resp. rate 20, height 4\' 10"  (1.473 m), weight 53.7 kg, SpO2 100 %.    Vent Mode: PSV;CPAP FiO2 (%):  [30 %] 30 % Set Rate:  [16 bmp] 16 bmp Vt Set:  [330 mL-350 mL] 350 mL PEEP:  [5 cmH20] 5 cmH20 Pressure Support:  [10 cmH20] 10 cmH20 Plateau Pressure:  [16 cmH20-21 cmH20] 17 cmH20   Intake/Output Summary (Last 24 hours) at 02/10/2020 0920 Last data filed at 02/10/2020 0800 Gross per 24 hour  Intake 2573.38 ml  Output 2660 ml  Net -86.62 ml   Filed Weights   01/29/20 0700  Weight: 53.7 kg  General: Acute and chronically ill-appearing woman, intubated on MV HEENT: ET tube in place, left facial droop Neuro: Opens eyes but not following commands. 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 Stable focal petechial hemorrhage, contrast stain in the right basal ganglia. MRI with multi-focal infarct.  No acute findings after episode of worsened R eye dev.  Neurology following. TTE negative for vegetations or thrombosis.  Remains intubated as mental status precludes extubation P: -ASA, statin -Neurology managing  Acute Respiratory failure secondary to above  P:  -Support the proposed plan for transition to comfort/one way extubation, family to gather 10/19 at 3:30pm.  Adenocarcinoma of the lung with malignant left pleural effusion - New this admit - CT chest 10/8 demonstrates 3.5 x 3.3 cm mass concerning for bronchogenic carcinoma. No evidence of distant metastatic disease, new finding this admission - cytology positive for malignant cells consistent with malignant Adenocarcinoma P: -Appreciate oncology input.  Poor candidate for any therapeutics due to her deconditioned state. -Chest tube output down to 60 cc over last 24 hours.  Can likely pull this afternoon after family has made their final decision regarding terminal extubation. -Appreciate wound care input and management.   -No plans for escalation of care  Staph hominis on blood cultures from both 10/11 and then again on 10/14.  Often would consider contaminant but since this was obtained twice, question clinical significance.  Was oxacillin resistant.  No evidence for valvular involvement on echocardiogram 10/8 -Plan to continue empiric vancomycin, cefepime for now  History of HTN -Continue home home medical regimen  HIV/AIDS Last CD4 count 242 in 07/2019 -Discovy and Tivicay  T2DM  -SSI as ordered -Levemir 10 units twice daily   Nutrition -Tube feeding  Hx of Breast Cancer  - initially diagnosed 2002 s/p lumpectomy and tamoxifen , recurrence 2010 with resection, XRT, Arimedex, last oncology note in 2018 from [place]  anastrozole on hold   Normocytic anemia P:  H/H stable  -Follow CBC intermittently   Best practice:  Diet: NPO/ TF Pain/Anxiety/Delirium protocol (if indicated): prn fentanyl VAP protocol (if indicated): In place  DVT prophylaxis: Lovenox GI prophylaxis: PPI  Glucose control: SSI Mobility: Bed Rest  Code Status: DNR if arrests Family Communication:   Appreciate palliative care assistance.  Looks like plan is for family gathering 10/19 at 3:30 and probable withdrawal / one way extubation.   Montey Hora, [place] [facility] 02/10/2020, 9:20 AM

## 2020-02-10 NOTE — Progress Notes (Signed)
STROKE TEAM PROGRESS NOTE   INTERVAL HISTORY No family at the bedside. Per palliative care, there is family meeting scheduled this afternoon. Chest tube still in place. Pt able to open eyes with tactile stimulation, however, not tracking, not following commands. Still on ventilation.    Vitals:   02/10/20 0600 02/10/20 0700 02/10/20 0742 02/10/20 0800  BP: (!) 132/50 (!) 111/44  127/63  Pulse: 94 92  83  Resp: 20 (!) 25  20  Temp: 99.3 F (37.4 C) 99.5 F (37.5 C)  99.9 F (37.7 C)  TempSrc:    Esophageal  SpO2: 100% 100% 100% 100%  Weight:      Height:       CBC:  Recent Labs  Lab 02/07/20 0502 02/09/20 0632  WBC 15.9* 14.2*  HGB 7.6* 7.6*  HCT 24.0* 24.2*  MCV 89.9 91.7  PLT 354 599*   Basic Metabolic Panel:  Recent Labs  Lab 02/04/20 0540 02/04/20 0540 02/05/20 0453 02/05/20 0910 02/07/20 0502 02/09/20 0632  NA 140  --   --    < > 139 137  K 3.9  --   --    < > 3.8 4.3  CL 111  --   --    < > 105 104  CO2 21*  --   --    < > 26 25  GLUCOSE 208*  --   --    < > 169* 166*  BUN 16  --   --    < > 17 19  CREATININE 0.86  --   --    < > 0.92 0.83  CALCIUM 7.3*  --   --    < > 7.5* 7.7*  MG 1.7   < > 1.9  --  2.0  --   PHOS 2.5  --   --   --   --   --    < > = values in this interval not displayed.    IMAGING past 24 hours No results found.  PHYSICAL EXAM   Temp:  [98.8 F (37.1 C)-100.2 F (37.9 C)] 99.9 F (37.7 C) (10/19 0800) Pulse Rate:  [77-97] 83 (10/19 0800) Resp:  [16-29] 20 (10/19 0800) BP: (83-136)/(42-64) 127/63 (10/19 0800) SpO2:  [95 %-100 %] 100 % (10/19 0800) FiO2 (%):  [30 %] 30 % (10/19 0800)  General - Well nourished, well developed, intubated on ventilator  Ophthalmologic - fundi not visualized due to noncooperation.  Cardiovascular - Regular rhythm and rate  Neuro - intubated on vent, eyes closed but open on tactile stimulation. Did not follow commands. Right gaze preference not cross midline. Not blinking to visual threat  bilaterally, not tracking, PERRL. Corneal reflex present, gag and cough present. Breathing over the vent.  Facial symmetry not able to test due to ET tube.  Tongue protrusion not cooperative. Spontaneous movement of RUE and localize to pain, withdraw RLE to pain 2+/5. LUE and LLE no spontaneous movement, but slight withdraw to pain. DTR 1+ and no babinski. Sensation, coordination and gait not tested.   ASSESSMENT/PLAN Amber Lopez is a 79 y.o. female with history of depression, HLD, HIV, HTN and DM presenting with left hemiplegia, left facial droop, right eye deviation and aphasia. Received IV tPA 01/29/2020 at 0816. -> IR 01/29/20  Stroke:  R MCA and small L cerebellar infarcts s/p tPA + IR R M1 occlusion w/ TICI2c revascularization, embolic secondary to unknown source possibly hypercoagulability from lung cancer  Code Stroke CT  head No acute abnormality. Atrophy. ASPECTS 10.     CTA head & neck LVO R M1. Atherosclerosis. L superior sulcus mass w/ large pleural effusion malignant appearing w/ associated mediastinal adenopathy.  Cerebral angio / IR - R M1 occlusion s/p TICI2c revascularization using Tiger x 1, embotrap x 1, solitaire x 2.  Post IR CT - no ICH, contrast stain R basal ganglia  MRI  R MCA multifocal infarct w/ mild petechial hemorrhage. Small L cerebellar infarct. Atrophy,  CT Head repeat 01/31/20 - Expected evolution of right MCA nonhemorrhagic infarct. Stable focal subarachnoid hemorrhage near the right MCA bifurcation. Stable atrophy and white matter disease on the left.  CT head 10/11 stable w/ expected evolution infarct w/ slight increase in edema. Stable R SAH. Same small vessel disease, atrophy.  2D Echo EF 55 to 60%, large pleural effusion on the left  LDL 62  HgbA1c 6.5  VTE prophylaxis - Lovenox 40 mg sq daily   No antithrombotic prior to admission, ASA 81 mg daily started 01/31/20  Therapy recommendations:  SNF  Disposition:  pending, palliative care meet  family this afternoon  Acute Respiratory Failure  Secondary to stroke  Intubated for IR, remains intubated w/ full support  CXR new ill-defined opacity right upper lobe raising concern for developing pneumonia.  Small left pleural effusion with left base atelectasis and suspect neoplasm in the left medial apex.  Not a candidate for long-term trach   CCM on board  Palliative care on board, possible one way extubation 10/19  Fever and leukocytosis  TMax 101.7->100.4->100.2  Leukocytosis 11.0->12.8->15.2->16.4->15.9->14.2   Blood Cx from  101/11 +Staph hominis  CXR 02/07/20 Suspect mass in the medial left apex. Correlation with chest CT, ideally with intravenous contrast, to further evaluate this area may well be warranted. Small left pleural effusion with bibasilar .atelectasis.  Blood cultures 10/14 - 1/2 staph hominis - sensitive to Vancomycin   Sputum Cultures 10/14 - Stenotrophomonas Maltophilia - sensitive to Levofloxacin and trimeth / sulfa  Vanc/Cefepime 10/14>>   Severe anemia  Acute blood loss anemia s/p IR groin bleeding. Sheath removed using quick clot and pressure held   Hb 13.2->9.2->8.2->6.5 . .8.8->8.8->8.4->7.6->7.6  S/p PRBC transfusion  CT abd/pelvis no retroperitoneal hemorrhage  CBC monitoring   Adenocarcinoma of lung w/ L pleural effusion  CTA neck - L superior sulcus mass w/ large pleural effusion malignant appearing w/ associated mediastinal adenopathy.  CT chest large left pleural effusion with near complete left lung collapse.  Left apical mass suspicious for bronchogenic carcinoma  CT abd/pelvis no distal metastasis disease  Lt chest tube placed 01/31/20 - Dr Thayer Jew  Cytology of pleural fluid - Malignant cells consistent with metastatic adenocarcinoma   Oncology consult - stage IV adenocarcinoma of the lung, prognosis poor, not a candidate for aggressive tx - signed off 10/15  Family has agreed to not escalate care. Now partial DNR   Palliative care on board - family meeting this afternoon   Possible Seizure 10/11  Worsening R gaze forced deviation in setting of HTN, no shaking or jerking - 10/11 am   EEG continuous slowing  Repeat CT head 02/02/2020 slight increase in infarct now involving right caudate head as well but otherwise stable mass-effect on no midline shift  Hypotension/Hypertension  Home meds:  hctz 25, metoprolol 25  Current - Labetalol prn  Off phenylephepdirine   Stable now . BP goal normotensive   Diabetes type II Controlled  Home meds:  None listed  HgbA1c 6.5, goal <  7.0  CBGs  SSI  On levimir 10 bid  DB RN following    Dysphagia At risk malnutrition . Secondary to stroke . NPO . On TF   HIV  On HARRP   CD4 in 07/2019 242  CD4 02/03/2020 185  Other Stroke Risk Factors  Advanced age  Migraines   Other Active Problems  Depression / anxiety  Hx right breast cancer 200 s/p lumpectomy and tamoxifen, recurred 2010 w/ resection, XRT, on arimidex PTA  Watery stools on miralax and colace. flexi-seal placed. Meds stopped.    Hospital day # 12  This patient is critically ill due to right MCA stroke, status post TPA and thrombectomy, respiratory failure on ventilation, lung cancer with pleural effusion, severe anemia, fever with leukocytosis, and at significant risk of neurological worsening, death form recurrent stroke, hemorrhagic conversion, sepsis, heart failure, renal failure and hypertensive encephalopathy. This patient's care requires constant monitoring of vital signs, hemodynamics, respiratory and cardiac monitoring, review of multiple databases, neurological assessment, discussion with family, other specialists and medical decision making of high complexity. I spent 30 minutes of neurocritical care time in the care of this patient.    Amber Hawking, MD PhD Stroke Neurology 02/10/2020 10:19 AM  o contact Stroke Continuity provider, please refer to [email or web address]  After hours, contact General Neurology

## 2020-02-10 NOTE — Progress Notes (Signed)
[facility]  Met with patient's son and daughter today to discuss goals of care.  Wadie Lessen from palliative care was present with me for the conversation.   They stated that over the weekend they have had a change of heart over their desire for their mother's care.  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  Based on her prior accomplishments they feel that she would want a tracheostomy and PEG tube and they want to maximize her time on this earth and give her a chance to recovery.    I explained in no uncertain terms that I felt that this course of action would most certainly bring about personal suffering and lack of dignity and I asked them to clarify if they feel that she would want this for herself.  They stated she would.  Plan tracheostomy on 10/20.  Amber Awkward, MD [facility] Pager: [phone] Cell: [phone] If no response, call [phone]

## 2020-02-11 ENCOUNTER — Inpatient Hospital Stay (HOSPITAL_COMMUNITY): Payer: Medicare Other

## 2020-02-11 DIAGNOSIS — C801 Malignant (primary) neoplasm, unspecified: Secondary | ICD-10-CM | POA: Diagnosis not present

## 2020-02-11 DIAGNOSIS — J9601 Acute respiratory failure with hypoxia: Secondary | ICD-10-CM

## 2020-02-11 DIAGNOSIS — I63511 Cerebral infarction due to unspecified occlusion or stenosis of right middle cerebral artery: Secondary | ICD-10-CM | POA: Diagnosis not present

## 2020-02-11 DIAGNOSIS — C3492 Malignant neoplasm of unspecified part of left bronchus or lung: Secondary | ICD-10-CM | POA: Diagnosis not present

## 2020-02-11 DIAGNOSIS — Z978 Presence of other specified devices: Secondary | ICD-10-CM

## 2020-02-11 DIAGNOSIS — Z9689 Presence of other specified functional implants: Secondary | ICD-10-CM | POA: Diagnosis not present

## 2020-02-11 DIAGNOSIS — J90 Pleural effusion, not elsewhere classified: Secondary | ICD-10-CM

## 2020-02-11 LAB — CBC
HCT: 22.2 % — ABNORMAL LOW (ref 36.0–46.0)
HCT: 26.1 % — ABNORMAL LOW (ref 36.0–46.0)
Hemoglobin: 6.9 g/dL — CL (ref 12.0–15.0)
Hemoglobin: 8.4 g/dL — ABNORMAL LOW (ref 12.0–15.0)
MCH: 28.6 pg (ref 26.0–34.0)
MCH: 29.5 pg (ref 26.0–34.0)
MCHC: 31.1 g/dL (ref 30.0–36.0)
MCHC: 32.2 g/dL (ref 30.0–36.0)
MCV: 92.1 fL (ref 80.0–100.0)
MCV: 91.6 fL (ref 80.0–100.0)
Platelets: 502 10*3/uL — ABNORMAL HIGH (ref 150–400)
Platelets: 421 10*3/uL — ABNORMAL HIGH (ref 150–400)
RBC: 2.41 MIL/uL — ABNORMAL LOW (ref 3.87–5.11)
RBC: 2.85 MIL/uL — ABNORMAL LOW (ref 3.87–5.11)
RDW: 14.7 % (ref 11.5–15.5)
RDW: 14.6 % (ref 11.5–15.5)
WBC: 15.6 10*3/uL — ABNORMAL HIGH (ref 4.0–10.5)
WBC: 13.6 10*3/uL — ABNORMAL HIGH (ref 4.0–10.5)
nRBC: 0 % (ref 0.0–0.2)
nRBC: 0 % (ref 0.0–0.2)

## 2020-02-11 LAB — VANCOMYCIN, TROUGH: Vancomycin Tr: 22 ug/mL (ref 15–20)

## 2020-02-11 LAB — APTT: aPTT: 33 seconds (ref 24–36)

## 2020-02-11 LAB — GLUCOSE, CAPILLARY
Glucose-Capillary: 81 mg/dL (ref 70–99)
Glucose-Capillary: 87 mg/dL (ref 70–99)
Glucose-Capillary: 105 mg/dL — ABNORMAL HIGH (ref 70–99)
Glucose-Capillary: 134 mg/dL — ABNORMAL HIGH (ref 70–99)
Glucose-Capillary: 168 mg/dL — ABNORMAL HIGH (ref 70–99)
Glucose-Capillary: 142 mg/dL — ABNORMAL HIGH (ref 70–99)

## 2020-02-11 LAB — PROTIME-INR
INR: 1.3 — ABNORMAL HIGH (ref 0.8–1.2)
Prothrombin Time: 15.7 seconds — ABNORMAL HIGH (ref 11.4–15.2)

## 2020-02-11 LAB — PREPARE RBC (CROSSMATCH)

## 2020-02-11 MED ORDER — VANCOMYCIN HCL 1250 MG/250ML IV SOLN
1250.0000 mg | INTRAVENOUS | Status: AC
  Administered 2020-02-12 – 2020-02-18 (×7): 1250 mg via INTRAVENOUS
  Filled 2020-02-11 (×7): qty 250

## 2020-02-11 MED ORDER — SODIUM CHLORIDE 0.9% IV SOLUTION: Freq: Once | INTRAVENOUS | Status: AC

## 2020-02-11 MED ORDER — PROPOFOL 10 MG/ML IV BOLUS
INTRAVENOUS | Status: AC
  Administered 2020-02-11: 50 mg
  Filled 2020-02-11: qty 20

## 2020-02-11 MED ORDER — PHENYLEPHRINE 40 MCG/ML (10ML) SYRINGE FOR IV PUSH (FOR BLOOD PRESSURE SUPPORT)
PREFILLED_SYRINGE | INTRAVENOUS | Status: AC
  Administered 2020-02-11: 80 ug
  Filled 2020-02-11: qty 10

## 2020-02-11 NOTE — Procedures (Signed)
Cortrak  Person Inserting Tube:  King, Syrianna Schillaci E, RD Tube Type:  Cortrak - 43 inches Tube Location:  Left nare Initial Placement:  Stomach Secured by: Bridle Technique Used to Measure Tube Placement:  Documented cm marking at nare/ corner of mouth Cortrak Secured At:  65 cm    Cortrak Tube Team Note:  Consult received to place a Cortrak feeding tube.   No x-ray is required. RN may begin using tube.    If the tube becomes dislodged please keep the tube and contact the Cortrak team at [email or web address] (password [email or web address]) for replacement.  If after hours and replacement cannot be delayed, place a NG tube and confirm placement with an abdominal x-ray.    Nelwyn Hebdon King, MS, RD, LDN Pager number available on Amion 

## 2020-02-11 NOTE — Progress Notes (Signed)
Critical hemoglobin 6.9 called to [facility]

## 2020-02-11 NOTE — Progress Notes (Signed)
CRITICAL VALUE ALERT  Critical Value:  Vanc trough 22  Date & Time Notied:  02/11/20  Provider Notified: Pharmacist   Orders Received/Actions taken: Vanc dose corrected

## 2020-02-11 NOTE — Progress Notes (Addendum)
Patient ID: Amber Lopez, female   DOB: 05/06/1940, 79 y.o.   MRN: [id number]  This NP visited patient at the bedside as a follow up to last week's GOCs meeting and to meet with patient's two children to discuss current medical situation; diagnosis, prognosis, treatment option decisions, advanced directive decisions and anticipatory care needs.  Created space and opportunity for family to explore their thoughts and feelings regarding their mother's current medical situation.     They both tell me today that they have reconsidered their decision to extubate patient from ventilator.  After careful consideration they have jointly made the decision to continue with all life prolonging measures and are open to trach and PEG placement.  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  They tell me that they understand that the likely prognosis is poor but they remain hopeful for improvement.  They share that they are actually currently planning her celebration of life people  Notified both Dr Erlinda Hong and Dr Lake Bells.  Dr Lake Bells to bedside to talk with family, plan is for Trach and PEG tomorrow  Discussed with family  the importance of continued conversation with each  and their  medical providers regarding overall plan of care and treatment options,  ensuring decisions are within the context of the patients values and GOCs.  Questions and concerns addressed      Total time spent on the unit was 35 minutes  PMT will continue to support holistically  Greater than 50% of the time was spent in counseling and coordination of care  Wadie Lessen NP   Palliative Medicine Team Team Phone # [phone] Pager [phone]

## 2020-02-11 NOTE — Progress Notes (Signed)
STROKE TEAM PROGRESS NOTE   INTERVAL HISTORY RN at bedside, no family at bedside.  Family discussed with palliative care and CCM yesterday, decided go for trach and PEG.  Dr. Lake Bells performed tracheostomy this morning.  Vitals:   02/11/20 0600 02/11/20 0700 02/11/20 0735 02/11/20 0750  BP: (!) 133/49 (!) 130/49 (!) 131/47 (!) 123/48  Pulse: 82 87 86   Resp: 17 18 (!) 23 18  Temp: 99.1 F (37.3 C) 99.1 F (37.3 C) 99 F (37.2 C) 99 F (37.2 C)  TempSrc:   Esophageal Esophageal  SpO2: 100% 100%    Weight:      Height:       CBC:  Recent Labs  Lab 02/09/20 0632 02/11/20 0433  WBC 14.2* 15.6*  HGB 7.6* 6.9*  HCT 24.2* 22.2*  MCV 91.7 92.1  PLT 430* 433*   Basic Metabolic Panel:  Recent Labs  Lab 02/05/20 0453 02/05/20 0910 02/07/20 0502 02/09/20 0632  NA  --    < > 139 137  K  --    < > 3.8 4.3  CL  --    < > 105 104  CO2  --    < > 26 25  GLUCOSE  --    < > 169* 166*  BUN  --    < > 17 19  CREATININE  --    < > 0.92 0.83  CALCIUM  --    < > 7.5* 7.7*  MG 1.9  --  2.0  --    < > = values in this interval not displayed.    IMAGING past 24 hours DG CHEST PORT 1 VIEW  Result Date: 02/10/2020 CLINICAL DATA:  Left pleural effusion. Chest tube. History of diabetes. EXAM: PORTABLE CHEST 1 VIEW COMPARISON:  Chest x-ray 02/07/2020, 01/30/2020. CT report 01/30/2020. FINDINGS: Endotracheal tube, NG tube, left PICC line, left chest tube in stable position. Heart size normal. Mild right upper lung infiltrate. Persistent bibasilar atelectasis/infiltrates and small left pleural effusion. Again there is a possible mass in the left apex as noted on prior studies. No pneumothorax. Heart size normal. Surgical clips right chest. IMPRESSION: 1. Lines and tubes including left chest tube in stable position. No pneumothorax. 2. Mild right upper lung infiltrate. Persistent bibasilar atelectasis/infiltrates and small left pleural effusion. 3. Again there is a possible mass in the left apex  as noted on prior studies. Electronically Signed   By: Marcello Moores  Register   On: 02/10/2020 10:22    PHYSICAL EXAM  Temp:  [97.9 F (36.6 C)-99.5 F (37.5 C)] 99 F (37.2 C) (10/20 0750) Pulse Rate:  [77-99] 86 (10/20 0735) Resp:  [16-32] 18 (10/20 0750) BP: (93-133)/(41-75) 123/48 (10/20 0750) SpO2:  [100 %] 100 % (10/20 0700) FiO2 (%):  [30 %] 30 % (10/20 0400)  General - Well nourished, well developed, intubated on ventilator  Ophthalmologic - fundi not visualized due to noncooperation.  Cardiovascular - Regular rhythm and rate  Neuro - intubated on vent, eyes spontaneous open however did not follow simple commands. Right gaze preference not cross midline.  Blinking to visual threat on the right but not on the left, able to track on the right visual field, PERRL. Corneal reflex present, gag and cough present. Breathing over the vent.  Facial symmetry not able to test due to ET tube.  Tongue protrusion not cooperative. Spontaneous movement of RUE and localize to pain, withdraw RLE to pain 2+/5. LUE and LLE no spontaneous movement, but slight withdraw  to pain. DTR 1+ and no babinski. Sensation, coordination and gait not tested.   ASSESSMENT/PLAN Amber Lopez is a 79 y.o. female with history of depression, HLD, HIV, HTN and DM presenting with left hemiplegia, left facial droop, right eye deviation and aphasia. Received IV tPA 01/29/2020 at 0816. -> IR 01/29/20  Stroke:  R MCA and small L cerebellar infarcts s/p tPA + IR R M1 occlusion w/ TICI2c revascularization, embolic secondary to unknown source possibly hypercoagulability from lung cancer  Code Stroke CT head No acute abnormality. Atrophy. ASPECTS 10.     CTA head & neck LVO R M1. Atherosclerosis. L superior sulcus mass w/ large pleural effusion malignant appearing w/ associated mediastinal adenopathy.  Cerebral angio / IR - R M1 occlusion s/p TICI2c revascularization using Tiger x 1, embotrap x 1, solitaire x 2.  Post IR  CT - no ICH, contrast stain R basal ganglia  MRI  R MCA multifocal infarct w/ mild petechial hemorrhage. Small L cerebellar infarct. Atrophy,  CT Head repeat 01/31/20 - Expected evolution of right MCA nonhemorrhagic infarct. Stable focal subarachnoid hemorrhage near the right MCA bifurcation. Stable atrophy and white matter disease on the left.  CT head 10/11 stable w/ expected evolution infarct w/ slight increase in edema. Stable R SAH. Same small vessel disease, atrophy.  2D Echo EF 55 to 60%, large pleural effusion on the left  LDL 62  HgbA1c 6.5  VTE prophylaxis - Lovenox 40 mg sq daily   No antithrombotic prior to admission, ASA 81 mg daily started 01/31/20  Therapy recommendations:  SNF  Disposition: Pending  Acute Respiratory Failure  Secondary to stroke  Intubated for IR, remains intubated  Not a candidate for extubation  CCM on board  Family discussed with palliative care and CCM, adamant about trach and PEG  S/p tracheostomy 10/20  Fever and leukocytosis  TMax 101.7->100.4->100.2->99.1  Leukocytosis 11.0->12.8->15.2->16.4->15.9->14.2->15.6   Blood Cx from  101/11 +Staph hominis  CXR 02/07/20 Suspect mass in the medial left apex. Correlation with chest CT, ideally with intravenous contrast, to further evaluate this area may well be warranted. Small left pleural effusion with bibasilar .atelectasis.  Blood cultures 10/14 - 1/2 staph hominis - sensitive to Vancomycin   Sputum Cultures 10/14 - Stenotrophomonas Maltophilia - sensitive to Levofloxacin and trimeth / sulfa  Vanc/Cefepime 10/14>>   Severe anemia  Acute blood loss anemia s/p IR groin bleeding. Sheath removed using quick clot and pressure held   Hb 13.2->9.2->8.2->6.5->PRBC...8.8->8.4->7.6->6.9->PRBC->8.4  S/p PRBC transfusion x 2  CT abd/pelvis no retroperitoneal hemorrhage  CBC monitoring   Adenocarcinoma of lung w/ L pleural effusion  CTA neck - L superior sulcus mass w/ large pleural  effusion malignant appearing w/ associated mediastinal adenopathy.  CT chest large left pleural effusion with near complete left lung collapse.  Left apical mass suspicious for bronchogenic carcinoma  CT abd/pelvis no distal metastasis disease  Lt chest tube placed 01/31/20 - Dr Thayer Jew  Cytology of pleural fluid - Malignant cells consistent with metastatic adenocarcinoma   Oncology consult - stage IV adenocarcinoma of the lung, prognosis poor, not a candidate for aggressive tx - signed off 10/15  Chest tube may be removed soon  Palliative care on board - family wants trach/PEG   Possible Seizure 10/11  Worsening R gaze forced deviation in setting of HTN, no shaking or jerking - 10/11 am   EEG continuous slowing  Repeat CT head 02/02/2020 slight increase in infarct now involving right caudate head as well but  otherwise stable mass-effect on no midline shift  Hypotension/Hypertension  Home meds:  hctz 25, metoprolol 25  Current - Labetalol prn  Off phenylephepdirine   Stable now . BP goal normotensive   Diabetes type II Controlled  Home meds:  None listed  HgbA1c 6.5, goal < 7.0  CBGs  SSI  On levimir 10 bid  DB RN following    Dysphagia At risk malnutrition . Secondary to stroke . NPO . On TF  . Plan for PEG soon   HIV  On HARRP   CD4 in 07/2019 242  CD4 02/03/2020 185  Other Stroke Risk Factors  Advanced age  Migraines   Other Active Problems  Depression / anxiety  Hx right breast cancer 200 s/p lumpectomy and tamoxifen, recurred 2010 w/ resection, XRT, on arimidex PTA  Meeting with CCM, Palliative Care and family 10/19 - family feels pt would want a tracheostomy and PEG tube and they want to maximize her time on this earth and give her a chance to recovery. (pt remains DNR)  Hospital day # 13  This patient is critically ill due to respiratory failure on ventilation, status post tracheostomy, lung cancer with pleural effusion, severe  anemia need blood transfusion x2, right MCA stroke, status post TPA and thrombectomy, fever and leukocytosis and at significant risk of neurological worsening, death form heart failure, renal failure, recurrent stroke, hemorrhagic conversion, sepsis. This patient's care requires constant monitoring of vital signs, hemodynamics, respiratory and cardiac monitoring, review of multiple databases, neurological assessment, discussion with family, other specialists and medical decision making of high complexity. I spent 35 minutes of neurocritical care time in the care of this patient.  Amber Hawking, MD PhD Stroke Neurology 02/11/2020 7:15 PM   o contact Stroke Continuity provider, please refer to [email or web address] After hours, contact General Neurology

## 2020-02-11 NOTE — Procedures (Signed)
Percutaneous Tracheostomy Procedure Note   PAXTON KANAAN  [id number]  January 05, 1941  Date:02/11/20  Time:11:25 AM   Provider Performing:Brent Heily Carlucci  Procedure: Percutaneous Tracheostomy with Bronchoscopic Guidance (83419)  Indication(s) Acute respiratory failure with hypoxemia  Stroke Failure to wean from mechanical ventilation  Consent Risks of the procedure as well as the alternatives and risks of each were explained to the patient and/or caregiver.  Consent for the procedure was obtained.  Anesthesia Etomidate, Versed, Fentanyl, Vecuronium   Time Out Verified patient identification, verified procedure, site/side was marked, verified correct patient position, special equipment/implants available, medications/allergies/relevant history reviewed, required imaging and test results available.   Sterile Technique Maximal sterile technique including sterile barrier drape, hand hygiene, sterile gown, sterile gloves, mask, hair covering.    Procedure Description Appropriate anatomy identified by palpation.  Patient's neck prepped and draped in sterile fashion.  1% lidocaine with epinephrine was used to anesthetize skin overlying neck.  1.5cm incision made and blunt dissection performed until tracheal rings could be easily palpated.   Then a size 6 Shiley tracheostomy was placed under bronchoscopic visualization using usual Seldinger technique and serial dilation.   Bronchoscope confirmed placement above the carina.  Tracheostomy was sutured in place with adhesive pad to protect skin under pressure.    Patient connected to ventilator.  The procedure was supervised by Dr. Kipp Brood MD   Complications/Tolerance None; patient tolerated the procedure well. Chest X-ray is ordered to confirm no post-procedural complication.   EBL Minimal   Specimen(s) None   Roselie Awkward, MD [facility] Pager: [phone] Cell: [phone] If no response, call [phone]

## 2020-02-11 NOTE — Progress Notes (Signed)
NAME:  Amber Lopez, MRN:  [id number], DOB:  1941-04-19, LOS: 62 ADMISSION DATE:  01/29/2020, CONSULTATION DATE:  10/7 REFERRING MD: Dr. Estanislado Pandy, CHIEF COMPLAINT: Left Sided Weakness  Brief History   79 y/o F, with HIV, admitted 10/7 with LVO of R M1 s/p tPA, neuro IR revascularization.  Returned to ICU post procedure on mechanical ventilation. Additional finding of left superior sulcus mass and complex effusion.   History of present illness   79 y/o F who presented to [facility] on 10/7 with acute onset left sided weakness, facial droop, aphasia and right eye deviation. She was last known normal at 0640 when her daughter assisted her with am medications.  The patient was in bed but reportedly normal at that time.  Her daughter left and returned home finding her mother with weakness on the left side and unable to speak.  EMS was activated and CODE STROKE was activated in the field.  On arrival to the ER the patient was documented to have left sided hemiparesis, left facial droop, non-verbal and right gaze deviation. Initial CT of the head was negative for acute findings.  CTA head/neck demonstrated  Past Medical History  HIV/AIDS, HTN, HLD, DM, Depression , Arthritis, Breast Cancer   Significant Hospital Events   10/07 Admit with L hemiplegia, facial droop, aphasia with right eye deviation 10/11 Episode of worsened R eye deviation with tachycardia and HTN, concern for sz  Consults:  Neurology, IR  Oncology 10/13 Palliative care 10/13  Procedures:  ETT 10/7 >> L Radial ALine 10/7 >>  out R Femoral Sheath 10/7 >> out  Significant Diagnostic Tests:  10/7 CT Head Code Stroke >> no acute finding, generalized atrophy  10/7 CTA Head/Neck >> emergent LVO at the right M1 segment, no visible embolic source, atherosclerosis without flow limiting stenosis or ulceration of major vessels, left superior sulcus mass with large complex pleural effusion, associated mediastinal adenopathy  10/7 CT ABD  w/contrast >> Negative for hematoma 10/7 CT Chest w/contrast >> Persistent large left pleural effusion with near complete left lung collapse. Left apical mass measuring 3.5 x 3.3 cm suspicious for bronchogenic carcinoma 10/11 CT head>>no acute findings 10/11 EEG>>Continuous slow, generalized and maximal right frontotemporal region  Micro Data:  COVID 10/7 >> negative  Influenza A/B 10/7 >> negative Pleural fluid culture>>no growth three days 10/9 BCx2>> 1/2 with gram positives 10/7 MRSA screen>>negative 10/11 BC x 1 (from left PICC) > staph hominis 1/2 10/14 trach asp >> few WBC, rare G+ cocci>> few stenotrophomonas maltophilia S to levaquin and bactrim  10/14 BC >> negative  Antimicrobials:  10/7 cefazolin 10/8 dolutegravir/ emtricitabine/ tenofovir > 10/14 vanc > 10/14 cefepime >  Interim history/subjective:  Family meeting yesterday determined the patient would want aggressive measures including trach/PEG  Objective   Blood pressure (!) 131/47, pulse 86, temperature 99 F (37.2 C), temperature source Esophageal, resp. rate (!) 23, height 4\' 10"  (1.473 m), weight 53.7 kg, SpO2 100 %.    Vent Mode: PRVC FiO2 (%):  [30 %] 30 % Set Rate:  [16 bmp] 16 bmp Vt Set:  [330 mL] 330 mL PEEP:  [5 cmH20] 5 cmH20 Plateau Pressure:  [16 cmH20-21 cmH20] 17 cmH20   Intake/Output Summary (Last 24 hours) at 02/11/2020 0758 Last data filed at 02/11/2020 0600 Gross per 24 hour  Intake 1359.22 ml  Output 1170 ml  Net 189.22 ml   Filed Weights   01/29/20 0700  Weight: 53.7 kg    General: Elderly appearing female  in NAD on vent.  HEENT: ET tube in place, left facial droop. Neuro: Eyes open to voice. Wiggles toes on R to command.  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 Stable focal petechial hemorrhage, contrast stain in the right basal ganglia. MRI with multi-focal infarct.  No acute findings after episode of worsened R eye dev.  Neurology following. TTE negative for vegetations or thrombosis.  Remains intubated as mental status precludes extubation P: -ASA, statin -Neurology managing  Acute Respiratory failure secondary to above  P:  -Full vent support -Family wishes to proceed with tracheostomy, after discussion with Dr. Lake Bells and Palliative care provider on 10/19. Trach planned for today. -VAP bundle  Adenocarcinoma of the lung with malignant left pleural effusion - New finding this admission. CT chest 10/8 demonstrates 3.5 x 3.3 cm mass concerning for primary bronchogenic carcinoma. Cytology positive for malignant cells consistent with malignant Adenocarcinoma P: -Appreciate oncology input.  Poor candidate for any therapeutics due to her deconditioned state. They have signed off.  -Chest tube output down to 20 cc over last 24 hours.  Will plan to DC chest tube while she is sedated for tracheostomy. If effusion recurs she would need PleurX placed.  -Appreciate wound care input and management.    Staph hominis on blood cultures from both 10/11 and then again on 10/14.  Often would consider contaminant but since this was obtained twice, question clinical significance.  Was oxacillin resistant.  No evidence for valvular involvement on echocardiogram 10/8 - Plan to treat as uncomplicated bacteremia with vancomycin for 2 week course. Started 10/14.  - DC cefepime after today's dosing.   History of HTN -Continue home home medical regimen  HIV/AIDS: Last CD4 count 242 in 07/2019 -Discovy and Tivicay  T2DM  -SSI as ordered -Levemir 10 units twice daily   Nutrition -Tube feeding  Hx of Breast Cancer: initially diagnosed 2002 s/p lumpectomy and tamoxifen , recurrence 2010 with resection, XRT, Arimedex, last oncology note in 2018 from  [place] anastrozole on hold   Normocytic anemia P:  -Follow CBC intermittently - One unit PRBC transfusing now for hemoglobin 6.9   Best practice:  Diet: NPO/ TF Pain/Anxiety/Delirium protocol (if indicated): prn fentanyl VAP protocol (if indicated): In place  DVT prophylaxis: Lovenox GI prophylaxis: PPI  Glucose control: SSI Mobility: Bed Rest  Code Status: DNR if arrests Family Communication:   Long family discussion 10/19 regarding GOC with Dr. Lake Bells and Palliative care resulted in plans for tracheostomy. I have spoke with her daughter this morning who confirms these plans.   Critical care time 42 minutes.    Georgann Housekeeper, AGACNP-BC [place]  See Amion for personal pager PCCM on call pager [phone]  02/11/2020 8:33 AM

## 2020-02-11 NOTE — Progress Notes (Signed)
Pharmacy Antibiotic Note  Amber Lopez is a 79 y.o. female admitted on 01/29/2020 with sepsis/PNA.  Pharmacy has been consulted for Cefepime and vancomycin dosing. SCr wnl stable.  WBC down to 13.6. AF. A vanc trough today was slightly supratherapeutic at 22.   Plan: Cefepime 2 gm IV Q 12 hours. Stopping today  Decrease vancomycin to 1250 mg IV Q 24 hours. 14 day LOT per CCM  Atovaquone for PCP prophylaxis (in the setting of sulfa allergy) per discussion with MD Monitor clinical progress, c/s, renal function F/u de-escalation plan/LOT, vancomycin levels as indicated   Height: 4\' 10"  (147.3 cm) Weight: 53.7 kg (118 lb 6.2 oz) IBW/kg (Calculated) : 40.9  Temp (24hrs), Avg:98.9 F (37.2 C), Min:97.9 F (36.6 C), Max:99.5 F (37.5 C)  Recent Labs  Lab 02/05/20 0910 02/06/20 0427 02/07/20 0502 02/07/20 1124 02/09/20 0632 02/11/20 0433  WBC 15.2* 16.4* 15.9*  --  14.2* 15.6*  CREATININE 0.81 0.83 0.92  --  0.83  --   VANCOTROUGH  --   --   --  14*  --   --     Estimated Creatinine Clearance: 39.9 mL/min (by C-G formula based on SCr of 0.83 mg/dL).    Allergies  Allergen Reactions  . Sulfa Antibiotics Swelling and Rash    Albertina Parr, PharmD., BCPS, BCCCP Clinical Pharmacist Please refer to [facility] for unit-specific pharmacist

## 2020-02-11 NOTE — Procedures (Signed)
Bedside Tracheostomy Insertion Procedure Note   Patient Details:   Name: Amber Lopez DOB: [birth year] MRN: [id number]  Procedure: Tracheostomy  Pre Procedure Assessment: ET Tube Size: ET Tube secured at lip (cm): Bite block in place: No Breath Sounds: Clear  Post Procedure Assessment: BP 136/63   Pulse 97   Temp 99.1 F (37.3 C)   Resp (!) 26   Ht 4\' 10"  (1.473 m)   Wt 53.7 kg   SpO2 100%   BMI 24.74 kg/m  O2 sats: stable throughout Complications: No apparent complications Patient did tolerate procedure well Tracheostomy Brand:Shiley Tracheostomy Style:Cuffed Tracheostomy Size: 6.0 Tracheostomy Secured TRZ:NBVAPOL Tracheostomy Placement Confirmation:Trach cuff visualized and in place and Chest X ray ordered for placement    Phillis Knack [facility] 02/11/2020, 11:07 AM

## 2020-02-11 NOTE — Progress Notes (Signed)
[place] Progress Note [name] DOB: 11-07-1940 MRN: [id number]   Date of Service  02/11/2020  HPI/Events of Note  Anemia - Hgb = 6.9.  eICU Interventions  Will transfuse 1 unit PRBC now.      Intervention Category Major Interventions: Other:  Daiel Strohecker Cornelia Copa 02/11/2020, 6:07 AM

## 2020-02-11 NOTE — Procedures (Signed)
Diagnostic Bronchoscopy Procedure Note   EVERLI ROTHER  [id number]  15-Mar-1941  Date:02/11/20  Time:11:02 AM   Provider Performing:Shaira Sova W [place]  Procedure: Diagnostic Bronchoscopy [phone])  Indication(s) Assist with direct visualization of tracheostomy placement  Consent Risks of the procedure as well as the alternatives and risks of each were explained to the patient and/or caregiver.  Consent for the procedure was obtained.   Anesthesia See separate tracheostomy note   Time Out Verified patient identification, verified procedure, site/side was marked, verified correct patient position, special equipment/implants available, medications/allergies/relevant history reviewed, required imaging and test results available.   Sterile Technique Usual hand hygiene, masks, gowns, and gloves were used   Procedure Description Bronchoscope advanced through endotracheal tube and into airway.  After suctioning out tracheal secretions, bronchoscope used to provide direct visualization of tracheostomy placement. Observed puncture of anterior wall of trachea. No injury to posterior wall. Observed wire placement and dilation. Once trach placed, bronchoscope was removed from the endotracheal tube and placed into the tracheostomy tube. Carina identified. Scope removed.    Complications/Tolerance None; patient tolerated the procedure well..   EBL None   Specimen(s) None   Georgann Housekeeper, AGACNP-BC [place] for personal pager PCCM on call pager [phone]  02/11/2020 11:05 AM

## 2020-02-12 ENCOUNTER — Inpatient Hospital Stay (HOSPITAL_COMMUNITY): Payer: Medicare Other

## 2020-02-12 DIAGNOSIS — Z9689 Presence of other specified functional implants: Secondary | ICD-10-CM | POA: Diagnosis not present

## 2020-02-12 DIAGNOSIS — I63511 Cerebral infarction due to unspecified occlusion or stenosis of right middle cerebral artery: Secondary | ICD-10-CM | POA: Diagnosis not present

## 2020-02-12 DIAGNOSIS — C3492 Malignant neoplasm of unspecified part of left bronchus or lung: Secondary | ICD-10-CM | POA: Diagnosis not present

## 2020-02-12 DIAGNOSIS — C801 Malignant (primary) neoplasm, unspecified: Secondary | ICD-10-CM | POA: Diagnosis not present

## 2020-02-12 DIAGNOSIS — Z9911 Dependence on respirator [ventilator] status: Secondary | ICD-10-CM

## 2020-02-12 DIAGNOSIS — Z930 Tracheostomy status: Secondary | ICD-10-CM

## 2020-02-12 DIAGNOSIS — I63411 Cerebral infarction due to embolism of right middle cerebral artery: Principal | ICD-10-CM

## 2020-02-12 DIAGNOSIS — J9601 Acute respiratory failure with hypoxia: Secondary | ICD-10-CM | POA: Diagnosis not present

## 2020-02-12 LAB — CBC
HCT: 27.7 % — ABNORMAL LOW (ref 36.0–46.0)
Hemoglobin: 8.8 g/dL — ABNORMAL LOW (ref 12.0–15.0)
MCH: 28.9 pg (ref 26.0–34.0)
MCHC: 31.8 g/dL (ref 30.0–36.0)
MCV: 91.1 fL (ref 80.0–100.0)
Platelets: 509 10*3/uL — ABNORMAL HIGH (ref 150–400)
RBC: 3.04 MIL/uL — ABNORMAL LOW (ref 3.87–5.11)
RDW: 14.6 % (ref 11.5–15.5)
WBC: 14.2 10*3/uL — ABNORMAL HIGH (ref 4.0–10.5)
nRBC: 0 % (ref 0.0–0.2)

## 2020-02-12 LAB — TYPE AND SCREEN
ABO/RH(D): B POS
Antibody Screen: NEGATIVE
Unit division: 0

## 2020-02-12 LAB — BASIC METABOLIC PANEL
Anion gap: 8 (ref 5–15)
BUN: 20 mg/dL (ref 8–23)
CO2: 23 mmol/L (ref 22–32)
Calcium: 7.8 mg/dL — ABNORMAL LOW (ref 8.9–10.3)
Chloride: 104 mmol/L (ref 98–111)
Creatinine, Ser: 0.83 mg/dL (ref 0.44–1.00)
GFR, Estimated: >60 mL/min (ref >60)
Glucose, Bld: 174 mg/dL — ABNORMAL HIGH (ref 70–99)
Potassium: 4 mmol/L (ref 3.5–5.1)
Sodium: 135 mmol/L (ref 135–145)

## 2020-02-12 LAB — BPAM RBC
Blood Product Expiration Date: 202111112359
ISSUE DATE / TIME: 202110200723
Unit Type and Rh: 7300

## 2020-02-12 LAB — GLUCOSE, CAPILLARY
Glucose-Capillary: 200 mg/dL — ABNORMAL HIGH (ref 70–99)
Glucose-Capillary: 166 mg/dL — ABNORMAL HIGH (ref 70–99)
Glucose-Capillary: 97 mg/dL (ref 70–99)
Glucose-Capillary: 117 mg/dL — ABNORMAL HIGH (ref 70–99)
Glucose-Capillary: 148 mg/dL — ABNORMAL HIGH (ref 70–99)
Glucose-Capillary: 123 mg/dL — ABNORMAL HIGH (ref 70–99)

## 2020-02-12 LAB — PHOSPHORUS: Phosphorus: 2.2 mg/dL — ABNORMAL LOW (ref 2.5–4.6)

## 2020-02-12 LAB — MAGNESIUM: Magnesium: 1.8 mg/dL (ref 1.7–2.4)

## 2020-02-12 NOTE — Progress Notes (Signed)
STROKE TEAM PROGRESS NOTE   INTERVAL HISTORY No family at bedside.  Patient had tracheostomy yesterday, currently on trach with vent on weaning trials.  Appears comfortable, eyes open, with right gaze preference.  Neurologically unchanged.  Vitals:   02/12/20 0800 02/12/20 0807 02/12/20 0900 02/12/20 1000  BP: (!) 141/74 (!) 141/74 (!) 146/61 (!) 135/50  Pulse: 87 88 86 75  Resp: 17 (!) 25 (!) 29 (!) 21  Temp: 99 F (37.2 C)     TempSrc: Axillary     SpO2: 100% 100% 100% 100%  Weight:      Height:       CBC:  Recent Labs  Lab 02/11/20 1402 02/12/20 0520  WBC 13.6* 14.2*  HGB 8.4* 8.8*  HCT 26.1* 27.7*  MCV 91.6 91.1  PLT 421* 403*   Basic Metabolic Panel:  Recent Labs  Lab 02/07/20 0502 02/07/20 0502 02/09/20 0632 02/12/20 0520  NA 139   < > 137 135  K 3.8   < > 4.3 4.0  CL 105   < > 104 104  CO2 26   < > 25 23  GLUCOSE 169*   < > 166* 174*  BUN 17   < > 19 20  CREATININE 0.92   < > 0.83 0.83  CALCIUM 7.5*   < > 7.7* 7.8*  MG 2.0  --   --  1.8  PHOS  --   --   --  2.2*   < > = values in this interval not displayed.    IMAGING past 24 hours DG CHEST PORT 1 VIEW  Result Date: 02/11/2020 CLINICAL DATA:  Tracheostomy tube. EXAM: PORTABLE CHEST 1 VIEW COMPARISON:  February 10, 2020. FINDINGS: Stable cardiomediastinal silhouette. Tracheostomy tube is in grossly good position. Feeding tube is seen entering stomach. No pneumothorax is noted. Left-sided PICC line is unchanged in position. Small left pleural effusion is noted. Left-sided chest tube has been removed. Stable bibasilar opacities are noted concerning for atelectasis or possibly infiltrates. Bony thorax is unremarkable. IMPRESSION: Left-sided chest tube has been removed. No pneumothorax is noted. Small left pleural effusion is noted. Stable bibasilar opacities as described above. Electronically Signed   By: Marijo Conception M.D.   On: 02/11/2020 15:05    PHYSICAL EXAM   Temp:  [97.7 F (36.5 C)-99.3 F (37.4  C)] 99 F (37.2 C) (10/21 0800) Pulse Rate:  [71-99] 75 (10/21 1000) Resp:  [16-46] 21 (10/21 1000) BP: (110-170)/(43-80) 135/50 (10/21 1000) SpO2:  [97 %-100 %] 100 % (10/21 1000) FiO2 (%):  [30 %] 30 % (10/21 0807)  General - Well nourished, well developed, s/p trach on ventilator  Ophthalmologic - fundi not visualized due to noncooperation.  Cardiovascular - Regular rhythm and rate  Neuro - s/p trach on vent, eyes spontaneous open however did not follow simple commands. Right gaze preference not cross midline.  Blinking to visual threat on the right but not on the left, able to track on the right visual field, PERRL. Corneal reflex present, gag and cough present. Breathing over the vent.    Left facial droop.  Tongue protrusion not cooperative. Spontaneous movement of RUE and localize to pain, withdraw RLE to pain 2+/5. LUE and LLE no spontaneous movement, but slight withdraw to pain. DTR 1+ and no babinski. Sensation, coordination and gait not tested.   ASSESSMENT/PLAN Ms. TAMIKIA CHOWNING is a 79 y.o. female with history of depression, HLD, HIV, HTN and DM presenting with left hemiplegia, left facial  droop, right eye deviation and aphasia. Received IV tPA 01/29/2020 at 0816. -> IR 01/29/20  Stroke:  R MCA and small L cerebellar infarcts s/p tPA + IR R M1 occlusion w/ TICI2c revascularization, embolic secondary to unknown source possibly hypercoagulability from lung cancer  Code Stroke CT head No acute abnormality. Atrophy. ASPECTS 10.     CTA head & neck LVO R M1. Atherosclerosis. L superior sulcus mass w/ large pleural effusion malignant appearing w/ associated mediastinal adenopathy.  Cerebral angio / IR - R M1 occlusion s/p TICI2c revascularization using Tiger x 1, embotrap x 1, solitaire x 2.  Post IR CT - no ICH, contrast stain R basal ganglia  MRI  R MCA multifocal infarct w/ mild petechial hemorrhage. Small L cerebellar infarct. Atrophy,  CT Head repeat 01/31/20 - Expected  evolution of right MCA nonhemorrhagic infarct. Stable focal subarachnoid hemorrhage near the right MCA bifurcation. Stable atrophy and white matter disease on the left.  CT head 10/11 stable w/ expected evolution infarct w/ slight increase in edema. Stable R SAH. Same small vessel disease, atrophy.  2D Echo EF 55 to 60%, large pleural effusion on the left  LDL 62  HgbA1c 6.5  VTE prophylaxis - Lovenox 40 mg sq daily   No antithrombotic prior to admission, ASA 81 mg daily started 01/31/20  Therapy recommendations:  SNF  Disposition: Pending  Acute Respiratory Failure  Secondary to stroke  Intubated for IR   Not a candidate for extubation  CCM on board  Family discussed with palliative care and CCM, adamant about trach and PEG  S/p tracheostomy 10/20  On weaning now  Fever and leukocytosis  TMax 101.7->100.4->100.2->99.3  Leukocytosis 11.0->12.8->15.2->16.4->15.9->14.2->15.6->13.6->14.2   Blood Cx from  101/11 +Staph hominis  CXR 02/07/20 Suspect mass in the medial left apex. Correlation with chest CT, ideally with intravenous contrast, to further evaluate this area may well be warranted. Small left pleural effusion with bibasilar .atelectasis.  Blood cultures 10/14 - 1/2 staph hominis - sensitive to Vancomycin   Sputum Cultures 10/14 - Stenotrophomonas Maltophilia - sensitive to Levofloxacin and trimeth / sulfa  Vanc/Cefepime 10/14>>  (plan 2 wk course)  Severe anemia  Acute blood loss anemia s/p IR groin bleeding. Sheath removed using quick clot and pressure held   Hb 13.2->9.2->8.2->6.5->PRBC...8.8->8.4->7.6->6.9->PRBC->8.4->8.8  S/p PRBC transfusion x 2  CT abd/pelvis no retroperitoneal hemorrhage  CBC monitoring   Adenocarcinoma of lung w/ malignant L pleural effusion  CTA neck - L superior sulcus mass w/ large pleural effusion malignant appearing w/ associated mediastinal adenopathy.  CT chest large left pleural effusion with near complete left  lung collapse.  Left apical mass suspicious for bronchogenic carcinoma  CT abd/pelvis no distal metastasis disease  Lt chest tube placed 01/31/20 - Dr Thayer Jew  Cytology of pleural fluid - Malignant cells consistent with metastatic adenocarcinoma   Oncology consult - stage IV adenocarcinoma of the lung, prognosis poor, not a candidate for aggressive tx - signed off 10/15  Chest tube may be removed soon  Palliative care on board - family wants trach/PEG   Possible Seizure 10/11  Worsening R gaze forced deviation in setting of HTN, no shaking or jerking - 10/11 am   EEG continuous slowing  Repeat CT head 02/02/2020 slight increase in infarct now involving right caudate head as well but otherwise stable mass-effect on no midline shift  Hypotension/Hypertension  Home meds:  hctz 25, metoprolol 25  Current - Labetalol prn  Off phenylephepdirine   Stable now . BP goal  normotensive   Diabetes type II Controlled  Home meds:  None listed  HgbA1c 6.5, goal < 7.0  CBGs  SSI  On levimir 10 bid  DB RN following    Dysphagia At risk malnutrition . Secondary to stroke . NPO . On TF  . Plan for PEG soon   HIV  On HARRP   CD4 in 07/2019 242  CD4 02/03/2020 185  Other Stroke Risk Factors  Advanced age  Migraines   Other Active Problems  Depression / anxiety  Hx right breast cancer 200 s/p lumpectomy and tamoxifen, recurred 2010 w/ resection, XRT, on arimidex PTA  Meeting with CCM, Palliative Care and family 10/19 - family feels pt would want a tracheostomy and PEG tube and they want to maximize her time on this earth and give her a chance to recovery. (pt remains DNR)  Hospital day # 14  This patient is critically ill due to right MCA stroke status post thrombectomy and TPA, respirate failure status post tracheostomy, lung cancer with pleural effusion with chest tube, severe anemia with blood transfusion x2, fever and leukocytosis and at significant risk of  neurological worsening, death form recurrent stroke, hemorrhagic conversion, respite failure, sepsis, heart failure. This patient's care requires constant monitoring of vital signs, hemodynamics, respiratory and cardiac monitoring, review of multiple databases, neurological assessment, discussion with family, other specialists and medical decision making of high complexity. I spent 30 minutes of neurocritical care time in the care of this patient.  I discussed with Dr. Lake Bells from [facility]  Rosalin Hawking, MD PhD Stroke Neurology 02/12/2020 10:21 AM   o contact Stroke Continuity provider, please refer to [email or web address] After hours, contact General Neurology

## 2020-02-12 NOTE — Progress Notes (Signed)
Patient ID: Amber Lopez, female   DOB: Jul 06, 1940, 79 y.o.   MRN: [id number]  This NP visited patient at the bedside as a follow up for palliative medicine needs and emotional support.  Attempted to call and speak with both patient's children without success. Was able to leave message with daughter/Amber Lopez and I await/hope she will call back for continued conversation regarding plan of care and the next steps and transitions of care.   I will not be back in the hospital again until Sunday and will attempt to make  contact with family at that time.  Please call PMT ream phone # [phone] with needs/questions  in the interim  Patient's long term prognosis is poor 2/2 to multiple co-morbidities    No charge  PMT will continue to support holistically  Wadie Lessen NP  Palliative Medicine Team Team Phone # [phone] Pager [phone]

## 2020-02-12 NOTE — Progress Notes (Signed)
NAME:  Amber Lopez, MRN:  [id number], DOB:  [birth year], LOS: 63 ADMISSION DATE:  01/29/2020, CONSULTATION DATE:  10/7 REFERRING MD:  Estanislado Pandy, CHIEF COMPLAINT:  Left Sided Weakness   Brief History   79 y/o F, with HIV, admitted 10/7 with LVO of R M1 s/p tPA, neuro IR revascularization.  Returned to ICU post procedure on mechanical ventilation. Additional finding of left superior sulcus mass and complex effusion.   Past Medical History  HIV/AIDS, HTN, HLD, DM, Depression , Arthritis, Breast Cancer   Significant Hospital Events   10/07 Admit with L hemiplegia, facial droop, aphasia with right eye deviation 10/11 Episode of worsened R eye deviation with tachycardia and HTN, concern for sz  Consults:  Neurology, IR  Oncology 10/13 Palliative care 10/13  Procedures:  ETT 10/7 >> 10/20 L Radial ALine 10/7 >>  out R Femoral Sheath 10/7 >> out 10/20 tracheostomy >   Significant Diagnostic Tests:  10/7 CT Head Code Stroke >> no acute finding, generalized atrophy  10/7 CTA Head/Neck >> emergent LVO at the right M1 segment, no visible embolic source, atherosclerosis without flow limiting stenosis or ulceration of major vessels, left superior sulcus mass with large complex pleural effusion, associated mediastinal adenopathy  10/7 CT ABD w/contrast >> Negative for hematoma 10/7 CT Chest w/contrast >> Persistent large left pleural effusion with near complete left lung collapse. Left apical mass measuring 3.5 x 3.3 cm suspicious for bronchogenic carcinoma 10/11 CT head>>no acute findings 10/11 EEG>>Continuous slow, generalized and maximal right frontotemporal region  Micro Data:  COVID 10/7 >> negative  Influenza A/B 10/7 >> negative Pleural fluid culture>>no growth three days 10/9 BCx2>> 1/2 with gram positives 10/7 MRSA screen>>negative 10/11 BC x 1 (from left PICC) > staph hominis 1/2 10/14 trach asp >> few WBC, rare G+ cocci>> few stenotrophomonas maltophilia S to levaquin and  bactrim  10/14 BC >> negative  Antimicrobials:  10/7 cefazolin 10/8 dolutegravir/ emtricitabine/ tenofovir > 10/14 vanc > 10/14 cefepime > 10/20  Interim history/subjective:  Trach yesterday  Objective   Blood pressure (!) 135/50, pulse 75, temperature 99 F (37.2 C), temperature source Axillary, resp. rate (!) 21, height 4\' 10"  (1.473 m), weight 53.7 kg, SpO2 100 %.    Vent Mode: PSV;CPAP FiO2 (%):  [30 %] 30 % Set Rate:  [16 bmp] 16 bmp Vt Set:  [320 mL-330 mL] 320 mL PEEP:  [5 cmH20] 5 cmH20 Pressure Support:  [14 cmH20] 14 cmH20 Plateau Pressure:  [13 cmH20-20 cmH20] 18 cmH20   Intake/Output Summary (Last 24 hours) at 02/12/2020 1040 Last data filed at 02/12/2020 1000 Gross per 24 hour  Intake 1303.5 ml  Output 500 ml  Net 803.5 ml   Filed Weights   01/29/20 0700  Weight: 53.7 kg    Examination:  General:  In bed on vent HENT: NCAT tracheostomy in place PULM: Rhonchi bilaterally B, vent supported breathing CV: RRR, no mgr GI: BS+, soft, nontender MSK: normal bulk and tone Neuro: awake on vent, moves R arm purposefully    Resolved Hospital Problem list     Assessment & Plan:  Acute embolic CVA in R MCA and L cerebellum Secondary stroke prevention and hypertension management per neurology  Acute respiratory failure with hypoxemia, not weaning well on vent due to weakness, inability to clear secretions, complicated by pleural effusion S/p tracheostomy on 10/20 Tracheostomy management per routine Pressure support as long as tolerated VAP prevention Daily WUA/SBT  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  Normocytic anemia Monitor for bleeding Transfuse PRBC for Hgb < 7 gm/dL  Prognosis: poor; likelihood of survival to 6 months is < 5%.  I feel she should be home on hospice.  The family did not agree and insisted on aggressive measures, hence the tracheostomy placed today. Would pursue LTACH placement as their goal is for her to rehab and make it out to be evaluated for lung cancer treatment  Best practice:  Diet: tube feeding Pain/Anxiety/Delirium protocol (if indicated): yes, RASS 0 VAP protocol (if indicated): yes DVT prophylaxis: lovenox GI prophylaxis: Pantoprazole for stress ulcer prophylaxis Glucose control: SSI Mobility: bed rest Code Status: full Family Communication: updated with palliative care on 10/19 at length Disposition: LTACH  Labs   CBC: Recent Labs  Lab 02/07/20 0502 02/09/20 0632 02/11/20 0433 02/11/20 1402 02/12/20 0520  WBC 15.9* 14.2* 15.6* 13.6* 14.2*  HGB 7.6* 7.6* 6.9* 8.4* 8.8*  HCT 24.0* 24.2* 22.2* 26.1* 27.7*  MCV 89.9 91.7 92.1 91.6 91.1  PLT 354 430* 502* 421* 509*    Basic Metabolic Panel: Recent Labs  Lab 02/06/20 0427 02/07/20 0502 02/09/20 0632 02/12/20 0520  NA 137 139 137 135  K 3.8 3.8 4.3 4.0  CL 106 105 104 104  CO2 24 26 25 23   GLUCOSE 158* 169* 166* 174*  BUN 17 17 19 20   CREATININE 0.83 0.92 0.83 0.83  CALCIUM 7.4* 7.5* 7.7* 7.8*  MG  --  2.0  --  1.8  PHOS  --   --   --  2.2*   GFR: Estimated Creatinine Clearance: 39.9 mL/min (by C-G formula based on SCr of 0.83 mg/dL). Recent Labs  Lab 02/09/20 0632 02/11/20 0433 02/11/20 1402 02/12/20 0520  WBC 14.2* 15.6* 13.6* 14.2*    Liver Function Tests: No results for input(s): AST, ALT, ALKPHOS, BILITOT, PROT, ALBUMIN in the last 168 hours. No results for input(s): LIPASE, AMYLASE in the last 168 hours. No results  for input(s): AMMONIA in the last 168 hours.  ABG    Component Value Date/Time   PHART 7.419 01/30/2020 1042   PCO2ART 26.5 (L) 01/30/2020 1042   PO2ART 143 (H) 01/30/2020 1042   HCO3 17.3 (L) 01/30/2020 1042   TCO2 18 (L) 01/30/2020 1042   ACIDBASEDEF 7.0 (H) 01/30/2020 1042   O2SAT 99.0 01/30/2020 1042     Coagulation Profile: Recent Labs  Lab 02/11/20 0433  INR 1.3*    Cardiac Enzymes: No results for input(s): CKTOTAL, CKMB, CKMBINDEX, TROPONINI in the last 168 hours.  HbA1C: Hgb A1c MFr Bld  Date/Time Value Ref Range Status  01/29/2020 04:22 PM 6.5 (H) 4.8 - 5.6 % Final    Comment:    (NOTE) Pre diabetes:          5.7%-6.4%  Diabetes:              >6.4%  Glycemic control for   <7.0% adults with diabetes     CBG: Recent Labs  Lab 02/11/20 1648 02/11/20 1927 02/11/20 2309 02/12/20 0319 02/12/20 0748  GLUCAP 134* 168*  142* 200* 166*     Critical care time: 32 minutes     Roselie Awkward, MD [place] Pager: [phone] Cell: [phone] If no response, call [phone]

## 2020-02-12 NOTE — Progress Notes (Signed)
Nutrition Follow-up  DOCUMENTATION CODES:   Non-severe (moderate) malnutrition in context of chronic illness  INTERVENTION:   Tube Feeding via Cortrak: Osmolite 1.2 at 55 ml/hr Pro-Source 45 mL daily Provides 1624 kcals, 84 g of protein and 1069 mL of free water Meets 100% estimated calorie and protein needs  Obtain new weight   NUTRITION DIAGNOSIS:   Moderate Malnutrition related to chronic illness (HIV) as evidenced by mild muscle depletion, moderate muscle depletion, mild fat depletion, moderate fat depletion.  Being addressed via TF  GOAL:   Patient will meet greater than or equal to 90% of their needs  Met via TF   MONITOR:   Vent status, Weight trends, TF tolerance, Skin, Labs  REASON FOR ASSESSMENT:   Consult Enteral/tube feeding initiation and management  ASSESSMENT:   79 yo female presents with left sided weakness, facial droop, aphasia and right eye deviation and  admitted with ischemic stoke insetting of large vessel occlusion of right MCA M1 segment requiring revascularization, acute blood loss anemia with concern of retroperitoneal bleed, respiratory failure requiring intubation. PMH includes HIV, HTN, HLD, DM, depression, hx of breast cancer  10/07 - admitted, intubated, s/p revascularization of R MCA M1 occlusion 10/08 - CT chest with new mass consistent with malignant adenocarcinoma, RI multifocal infarct with mild petechial hemorrhage with small L cerebella infarct 10/09 - chest tube placed for large left pleural effusion 10/19 - GOC discussion, plan for trach/PEG 10/20 - trach, cortrak placed  Noted prognosis is poor with likelihood of survival 6 months; family wanting full care at this time including trach/PEG, LTACH  Tolerating Osmolite 1.2 at 50 ml/hr via Cortrak, Pro-Source 45 mL BID  No weight since 10/07  Labs: phosphorus 2.2 (L), CBGs 134-200 Meds: ss novolog, levemir, miralax  Diet Order:   Diet Order    None      EDUCATION  NEEDS:   Not appropriate for education at this time  Skin:  Skin Assessment: Skin Integrity Issues: Skin Integrity Issues:: Stage II Stage II: upper lip from ETT  Last BM:  10/19 rectal tube  Height:   Ht Readings from Last 1 Encounters:  01/29/20 4' 10"  (1.473 m)    Weight:   Wt Readings from Last 1 Encounters:  01/29/20 53.7 kg    BMI:  Body mass index is 24.74 kg/m.  Estimated Nutritional Needs:   Kcal:  1500-1700 kcals  Protein:  75-90 g  Fluid:  >/= 1.5 L   Kerman Passey MS, RDN, LDN, CNSC Registered Dietitian III Clinical Nutrition RD Pager and On-Call Pager Number Located in [place]

## 2020-02-12 NOTE — Care Management (Signed)
TOC consult noted for LTAC referral.  Pt's insurance requires that pt be on ventilator for 21 days before LTAC consideration; pt currently on day 14.  Will follow; make referral closer to day 21 on vent.   Reinaldo Raddle, RN, BSN  Trauma/Neuro ICU Case Manager [phone]

## 2020-02-12 NOTE — Progress Notes (Signed)
Physical Therapy Treatment Patient Details Name: Amber Lopez MRN: [id number] DOB: 1940-08-02 Today's Date: 02/12/2020    History of Present Illness 79 y.o. female with a PMHx of depression, HLD, HIV, HTN and DM presenting with acute onset of left hemiplegia, left facial droop, right eye deviation and aphasia. Pt received IV tPA and underwent R MCA revacularization on 01/29/2020. Pt also found to have large left pleural effusion with near complete L lung collapse and L apical mass. Chest tube placed on 01/31/2020. s/p trach on 02/11/20.     PT Comments    Pt seen on full vent support (PRVC FiO2 30% and PEEP 5) via trach today and she tolerated EOB for >20 minutes.  She was able to inconsistently follow some basic commands, attempted to right herself in sitting and continues to have significant R gaze preference that we attempted to manually bring to midline but met significant resistance.  Pt remains appropriate for SNF level rehab at discharge. PT will continue to follow acutely for safe mobility progression.  Follow Up Recommendations  SNF     Equipment Recommendations  Wheelchair (measurements PT);Wheelchair cushion (measurements PT);Hospital bed;3in1 (PT)    Recommendations for Other Services       Precautions / Restrictions Precautions Precautions: Fall Precaution Comments: trach, flexi, watch RR    Mobility  Bed Mobility Overal bed mobility: Needs Assistance Bed Mobility: Rolling;Sidelying to Sit Rolling: +2 for physical assistance;Max assist Sidelying to sit: +2 for physical assistance;Max assist   Sit to supine: +2 for physical assistance;Max assist   General bed mobility comments:  person max assist to come to sidelying (L) with hand over hand assist to reach, but pt helping, and then max assist to manage L leg over EOB, right leg coming with, and support trunk to come to sitting, pt using R hand to pull and initiating some weak trunk assist.  Assist needed at trunk and  bil legs to return to supine, but pt engaging at her abdomen as she stayed in a flexed position as bed was flat.  She slowly released trunk and cervical flexion, but RR spiked in the 40s per the vent (20s on monitor), but she was likely a believable mid 30s RR.  This was the only time her RR spiked at all during the session.  RT came in just prior to mobility and suctioned her and switched her over to full vent support (she had been weaning all day).       Modified Rankin (Stroke Patients Only) Modified Rankin (Stroke Patients Only) Pre-Morbid Rankin Score: Slight disability Modified Rankin: Severe disability     Balance Overall balance assessment: Needs assistance Sitting-balance support: Feet unsupported;Single extremity supported Sitting balance-Leahy Scale: Zero Sitting balance - Comments: up to max assist EOB, however, as we continued to work in sitting pt could be as good as very close supervision proped on pillow under R UE (she has a tendancy to lean R and posteriorly).  Pt was able to actively engage her trunk and hold position with R UE engagement in sitting.  She followed commands to squeeze R hand and kick R leg and also reached across with her right hand to touch her left leg (she never did get her gaze to midline, even when attempting to assist her to midline met with cervical resistance).  Postural control: Posterior lean;Right lateral lean  Cognition Arousal/Alertness: Lethargic;Awake/alert (fluctuates) Behavior During Therapy: Flat affect Overall Cognitive Status: Difficult to assess Area of Impairment: Following commands;Attention;Problem solving                   Current Attention Level: Sustained   Following Commands: Follows one step commands inconsistently;Follows one step commands with increased time     Problem Solving: Slow processing General Comments: Pt able to follow a couple of one step commands, strong  R gaze preference. PT reoriented her to situation.       Exercises      General Comments General comments (skin integrity, edema, etc.): Pt on PRVC FiO2 30% and PEEP 5, RR ranged from 20s-40s during session.  HR, O2 stable.  She did have a dip in her BP 80s/50s immediately upon returning supine, but this came back up once we repositioned her for comfort to 130s.      Pertinent Vitals/Pain Pain Assessment: Faces Faces Pain Scale: No hurt    Home Living                      Prior Function            PT Goals (current goals can now be found in the care plan section) Progress towards PT goals: Progressing toward goals    Frequency    Min 3X/week      PT Plan Current plan remains appropriate    Co-evaluation              AM-PAC PT "6 Clicks" Mobility   Outcome Measure  Help needed turning from your back to your side while in a flat bed without using bedrails?: Total Help needed moving from lying on your back to sitting on the side of a flat bed without using bedrails?: Total Help needed moving to and from a bed to a chair (including a wheelchair)?: Total Help needed standing up from a chair using your arms (e.g., wheelchair or bedside chair)?: Total Help needed to walk in hospital room?: Total Help needed climbing 3-5 steps with a railing? : Total 6 Click Score: 6    End of Session Equipment Utilized During Treatment: Oxygen (vent via trach) Activity Tolerance: Patient limited by fatigue Patient left: in bed;with call bell/phone within reach Nurse Communication: Mobility status PT Visit Diagnosis: Other abnormalities of gait and mobility (R26.89);Muscle weakness (generalized) (M62.81);Hemiplegia and hemiparesis;Other symptoms and signs involving the nervous system (R29.898) Hemiplegia - Right/Left: Left Hemiplegia - dominant/non-dominant: Non-dominant Hemiplegia - caused by: Cerebral infarction     Time: 4628-6381 PT Time Calculation (min) (ACUTE  ONLY): 34 min  Charges:  $Therapeutic Activity: 8-22 mins $Neuromuscular Re-education: 8-22 mins                     Verdene Lennert, PT, DPT  Acute Rehabilitation [phone] pager [phone]) [phone] office

## 2020-02-13 DIAGNOSIS — C801 Malignant (primary) neoplasm, unspecified: Secondary | ICD-10-CM | POA: Diagnosis not present

## 2020-02-13 DIAGNOSIS — E440 Moderate protein-calorie malnutrition: Secondary | ICD-10-CM | POA: Diagnosis not present

## 2020-02-13 DIAGNOSIS — C3492 Malignant neoplasm of unspecified part of left bronchus or lung: Secondary | ICD-10-CM | POA: Diagnosis not present

## 2020-02-13 DIAGNOSIS — I63511 Cerebral infarction due to unspecified occlusion or stenosis of right middle cerebral artery: Secondary | ICD-10-CM | POA: Diagnosis not present

## 2020-02-13 DIAGNOSIS — J9601 Acute respiratory failure with hypoxia: Secondary | ICD-10-CM | POA: Diagnosis not present

## 2020-02-13 LAB — COMPREHENSIVE METABOLIC PANEL
ALT: 62 U/L — ABNORMAL HIGH (ref 0–44)
AST: 43 U/L — ABNORMAL HIGH (ref 15–41)
Albumin: 1.4 g/dL — ABNORMAL LOW (ref 3.5–5.0)
Alkaline Phosphatase: 76 U/L (ref 38–126)
Anion gap: 8 (ref 5–15)
BUN: 18 mg/dL (ref 8–23)
CO2: 23 mmol/L (ref 22–32)
Calcium: 7.9 mg/dL — ABNORMAL LOW (ref 8.9–10.3)
Chloride: 104 mmol/L (ref 98–111)
Creatinine, Ser: 0.95 mg/dL (ref 0.44–1.00)
GFR, Estimated: >60 mL/min (ref >60)
Glucose, Bld: 229 mg/dL — ABNORMAL HIGH (ref 70–99)
Potassium: 4.3 mmol/L (ref 3.5–5.1)
Sodium: 135 mmol/L (ref 135–145)
Total Bilirubin: 0.5 mg/dL (ref 0.3–1.2)
Total Protein: 6.4 g/dL — ABNORMAL LOW (ref 6.5–8.1)

## 2020-02-13 LAB — GLUCOSE, CAPILLARY
Glucose-Capillary: 176 mg/dL — ABNORMAL HIGH (ref 70–99)
Glucose-Capillary: 190 mg/dL — ABNORMAL HIGH (ref 70–99)
Glucose-Capillary: 107 mg/dL — ABNORMAL HIGH (ref 70–99)
Glucose-Capillary: 188 mg/dL — ABNORMAL HIGH (ref 70–99)
Glucose-Capillary: 177 mg/dL — ABNORMAL HIGH (ref 70–99)
Glucose-Capillary: 225 mg/dL — ABNORMAL HIGH (ref 70–99)

## 2020-02-13 MED ORDER — INSULIN DETEMIR 100 UNIT/ML ~~LOC~~ SOLN
12.0000 [IU] | Freq: Two times a day (BID) | SUBCUTANEOUS | Status: DC
  Administered 2020-02-13 – 2020-02-23 (×19): 12 [IU] via SUBCUTANEOUS
  Filled 2020-02-13 (×22): qty 0.12

## 2020-02-13 MED ORDER — CHLORHEXIDINE GLUCONATE 0.12 % MT SOLN
OROMUCOSAL | Status: AC
  Filled 2020-02-13: qty 15

## 2020-02-13 NOTE — Progress Notes (Signed)
STROKE TEAM PROGRESS NOTE   INTERVAL HISTORY No family at the bedside. Pt on trach but not able to wean off vent. LTACH placement requested. Pt eyes open, still has right gaze and left hemiplegia. Neuro unchanged. Still has low grade fever overnight, continued on vanco. Talked with son and sister over the phone and they both agreed on PEG. Will consult trauma for PEG.    Vitals:   02/13/20 0800 02/13/20 0832 02/13/20 0915 02/13/20 1020  BP: (!) 130/56 (!) 130/56    Pulse: 92 96    Resp: (!) 25 (!) 26    Temp: 99.9 F (37.7 C)     TempSrc: Axillary     SpO2: 100%  100% 100%  Weight:      Height:       CBC:  Recent Labs  Lab 02/11/20 1402 02/12/20 0520  WBC 13.6* 14.2*  HGB 8.4* 8.8*  HCT 26.1* 27.7*  MCV 91.6 91.1  PLT 421* 270*   Basic Metabolic Panel:  Recent Labs  Lab 02/07/20 0502 02/09/20 0632 02/12/20 0520 02/13/20 0638  NA 139   < > 135 135  K 3.8   < > 4.0 4.3  CL 105   < > 104 104  CO2 26   < > 23 23  GLUCOSE 169*   < > 174* 229*  BUN 17   < > 20 18  CREATININE 0.92   < > 0.83 0.95  CALCIUM 7.5*   < > 7.8* 7.9*  MG 2.0  --  1.8  --   PHOS  --   --  2.2*  --    < > = values in this interval not displayed.    IMAGING past 24 hours DG Chest Port 1 View  Result Date: 02/12/2020 CLINICAL DATA:  Tracheostomy EXAM: PORTABLE CHEST 1 VIEW COMPARISON:  02/11/2020 FINDINGS: Tracheostomy overlies its expected position at the thoracic inlet. Nasoenteric feeding tube extends into the upper abdomen beyond the margin of the examination. Left upper extremity PICC line tip is seen within the superior vena cava. Lung volumes are small and there is bibasilar atelectasis. Superimposed nodular right basilar pulmonary infiltrate is difficult to exclude, but appears stable. Small left pleural effusion is unchanged. No pneumothorax. Cardiac size within normal limits. Pulmonary vascularity is normal. Surgical clips noted within the right axilla. IMPRESSION: Pulmonary hypoinflation  with bibasilar atelectasis. Stable possible superimposed right basilar nodular infiltrate and small left pleural effusion. Electronically Signed   By: Fidela Salisbury MD   On: 02/12/2020 11:18    PHYSICAL EXAM    Temp:  [98.6 F (37 C)-100.8 F (38.2 C)] 99.9 F (37.7 C) (10/22 0800) Pulse Rate:  [81-109] 96 (10/22 0832) Resp:  [21-39] 26 (10/22 0832) BP: (88-157)/(44-94) 130/56 (10/22 0832) SpO2:  [100 %] 100 % (10/22 1020) FiO2 (%):  [30 %-40 %] 40 % (10/22 1020) Weight:  [54.4 kg] 54.4 kg (10/22 0700)  General - Well nourished, well developed, s/p trach on ventilator  Ophthalmologic - fundi not visualized due to noncooperation.  Cardiovascular - Regular rhythm and rate  Neuro - s/p trach on vent, eyes spontaneous open however did not follow simple commands. Right gaze preference not cross midline.  Blinking to visual threat on the right but not on the left, able to track on the right visual field, PERRL. Corneal reflex present, gag and cough present. Breathing over the vent.    Left facial droop.  Tongue protrusion not cooperative. Spontaneous movement of RUE and localize  to pain, withdraw RLE to pain 2+/5. LUE and LLE no spontaneous movement, but slight withdraw to pain. DTR 1+ and no babinski. Sensation, coordination and gait not tested.   ASSESSMENT/PLAN Ms. DAKSHA KOONE is a 79 y.o. female with history of depression, HLD, HIV, HTN and DM presenting with left hemiplegia, left facial droop, right eye deviation and aphasia. Received IV tPA 01/29/2020 at 0816. -> IR 01/29/20  Stroke:  R MCA and small L cerebellar infarcts s/p tPA + IR R M1 occlusion w/ TICI2c revascularization, embolic secondary to unknown source possibly hypercoagulability from lung cancer  Code Stroke CT head No acute abnormality. Atrophy. ASPECTS 10.     CTA head & neck LVO R M1. Atherosclerosis. L superior sulcus mass w/ large pleural effusion malignant appearing w/ associated mediastinal  adenopathy.  Cerebral angio / IR - R M1 occlusion s/p TICI2c revascularization   Post IR CT - no ICH, contrast stain R basal ganglia  MRI  R MCA multifocal infarct w/ mild petechial hemorrhage. Small L cerebellar infarct. Atrophy,  CT Head repeat 01/31/20 - Expected evolution of right MCA infarct. Stable focal subarachnoid hemorrhage near the right MCA bifurcation.   CT head 10/11 stable w/ expected evolution infarct w/ slight increase in edema. Stable R SAH.   2D Echo EF 55 to 60%, large pleural effusion on the left  LDL 62  HgbA1c 6.5  VTE prophylaxis - Lovenox 40 mg sq daily   No antithrombotic prior to admission, ASA 81 mg daily started 01/31/20  Therapy recommendations:  SNF  Disposition: Pending  Acute Respiratory Failure  Secondary to stroke  Intubated for IR   CCM on board  Family discussed with palliative care and CCM, adamant about trach and PEG  S/p tracheostomy 10/20  Failed weaning   Consider LTACH (needs 21d of vent to qualify, today D#15)  CCM will follow intermittently for trach  CCM signed off 10/22 - may need consult hospitalist   Fever and leukocytosis  TMax 100.8  Leukocytosis 14.2   Blood Cx from  101/11 +Staph hominis  CXR 02/07/20 Suspect mass in the medial left apex. Correlation with chest CT, ideally with intravenous contrast, to further evaluate this area may well be warranted. Small left pleural effusion with bibasilar .atelectasis.  Blood cultures 10/14 - 1/2 staph hominis - sensitive to Vancomycin   Sputum Cultures 10/14 - Stenotrophomonas Maltophilia - sensitive to Levofloxacin and trimeth / sulfa  On Vanc 10/14 >>  (plan 14d course)  Completed cefepime 10/14 >>10/20  Severe anemia  Acute blood loss anemia s/p IR groin bleeding. Sheath removed using quick clot and pressure held   Hb 13.2->9.2->8.2->6.5->PRBC...8.8->8.4->7.6->6.9->PRBC->8.4->8.8  S/p PRBC transfusion x 2  CT abd/pelvis no retroperitoneal  hemorrhage  CBC monitoring   Adenocarcinoma of lung w/ malignant L pleural effusion  CTA neck - L superior sulcus mass w/ large pleural effusion malignant appearing w/ associated mediastinal adenopathy.  CT chest large left pleural effusion with near complete left lung collapse.  Left apical mass suspicious for bronchogenic carcinoma  CT abd/pelvis no distal metastasis disease  Lt chest tube placed 10/9 - Dr Thayer Jew - removed 10/21  Cytology of pleural fluid - Malignant cells consistent with metastatic adenocarcinoma   Oncology consult - stage IV adenocarcinoma of the lung, prognosis poor, not a candidate for aggressive tx - signed off 10/15  Palliative care on board - family wants trach/PEG  Prognosis poor   Possible Seizure 10/11  Worsening R gaze forced deviation in setting of  HTN, no shaking or jerking - 10/11 am   EEG continuous slowing  Repeat CT head 02/02/2020 slight increase in infarct now involving right caudate head as well but otherwise stable mass-effect on no midline shift  Hypotension/Hypertension  Home meds:  hctz 25, metoprolol 25  Current - Labetalol prn  Off phenylephepdirine   Stable now . BP goal normotensive   Diabetes type II Controlled  Home meds:  None listed  HgbA1c 6.5, goal < 7.0  CBGs  SSI  On levimir 10->12 bid  DB RN following    Dysphagia At risk malnutrition . Secondary to stroke . NPO . On TF  . Son and daughter agreed with PEG, will consult trauma in am   HIV  On HARRP   CD4 in 07/2019 242  CD4 02/03/2020 185  Other Stroke Risk Factors  Advanced age  Migraines   Other Active Problems  Depression / anxiety  Hx right breast cancer 200 s/p lumpectomy and tamoxifen, recurred 2010 w/ resection, XRT, on arimidex PTA  Meeting with CCM, Palliative Care and family 10/19 - family feels pt would want a tracheostomy and PEG tube and they want to maximize her time on this earth and give her a chance to recovery. (pt  remains DNR)  Hospital day # 15  This patient is critically ill due to large right MCA stroke, status post tracheostomy, left lung cancer, fever and leukocytosis, dysphagia, severe anemia and seizure and at significant risk of neurological worsening, death form recurrent stroke, hemorrhagic conversion, respiratory failure, sepsis, status epilepticus. This patient's care requires constant monitoring of vital signs, hemodynamics, respiratory and cardiac monitoring, review of multiple databases, neurological assessment, discussion with family, other specialists and medical decision making of high complexity. I spent 35 minutes of neurocritical care time in the care of this patient.  I discussed with Dr. Lake Bells and CCM PA Eddie Dibbles. I had long discussion with son and daughter over the phone, updated pt current condition, treatment plan and potential prognosis, and answered all the questions.  They expressed understanding and appreciation.    Rosalin Hawking, MD PhD Stroke Neurology 02/13/2020 11:07 AM   o contact Stroke Continuity provider, please refer to [email or web address] After hours, contact General Neurology

## 2020-02-13 NOTE — Progress Notes (Signed)
SLP Cancellation Note  Patient Details Name: Amber Lopez MRN: [id number] DOB: 12-18-1940   Cancelled treatment:       Reason Eval/Treat Not Completed: Patient with new tracheostomy. Orders for SLP eval and treat for PMSV and swallowing received. Will follow pt closely for readiness for SLP interventions as appropriate.     Osie Bond., M.A. [facility] Pager [phone] Office [phone]  02/13/2020, 7:44 AM

## 2020-02-13 NOTE — Progress Notes (Addendum)
NAME:  Amber Lopez, MRN:  [id number], DOB:  04/27/1940, LOS: 33 ADMISSION DATE:  01/29/2020, CONSULTATION DATE:  10/7 REFERRING MD:  Estanislado Pandy, CHIEF COMPLAINT:  Left Sided Weakness   Brief History   79 y/o F, with HIV, admitted 10/7 with LVO of R M1 s/p tPA, neuro IR revascularization.  Returned to ICU post procedure on mechanical ventilation. Additional finding of left superior sulcus mass and complex effusion.   Past Medical History  HIV/AIDS, HTN, HLD, DM, Depression , Arthritis, Breast Cancer   Significant Hospital Events   10/07 Admit with L hemiplegia, facial droop, aphasia with right eye deviation 10/11 Episode of worsened R eye deviation with tachycardia and HTN, concern for sz  Consults:  Neurology, IR  Oncology 10/13 Palliative care 10/13  Procedures:  ETT 10/7 >> 10/20 L Radial ALine 10/7 >>  out R Femoral Sheath 10/7 >> out 10/20 tracheostomy >   Significant Diagnostic Tests:  10/7 CT Head Code Stroke >> no acute finding, generalized atrophy  10/7 CTA Head/Neck >> emergent LVO at the right M1 segment, no visible embolic source, atherosclerosis without flow limiting stenosis or ulceration of major vessels, left superior sulcus mass with large complex pleural effusion, associated mediastinal adenopathy  10/7 CT ABD w/contrast >> Negative for hematoma 10/7 CT Chest w/contrast >> Persistent large left pleural effusion with near complete left lung collapse. Left apical mass measuring 3.5 x 3.3 cm suspicious for bronchogenic carcinoma 10/11 CT head>>no acute findings 10/11 EEG>>Continuous slow, generalized and maximal right frontotemporal region  Micro Data:  COVID 10/7 >> negative  Influenza A/B 10/7 >> negative Pleural fluid culture>>no growth three days 10/9 BCx2>> 1/2 with gram positives 10/7 MRSA screen>>negative 10/11 BC x 1 (from left PICC) > staph hominis 1/2 10/14 trach asp >> few WBC, rare G+ cocci>> few stenotrophomonas maltophilia S to levaquin and  bactrim  10/14 BC >> negative  Antimicrobials:  10/7 cefazolin 10/8 dolutegravir/ emtricitabine/ tenofovir > 10/14 vanc > 10/14 cefepime > 10/20  Interim history/subjective:  No acute event overnight. Failed vent wean after 1 hour.   Objective   Blood pressure (!) 130/56, pulse 92, temperature 99.9 F (37.7 C), temperature source Axillary, resp. rate (!) 25, height 4\' 10"  (1.473 m), weight 54.4 kg, SpO2 100 %.    Vent Mode: PRVC FiO2 (%):  [30 %] 30 % Set Rate:  [16 bmp] 16 bmp Vt Set:  [320 mL] 320 mL PEEP:  [5 cmH20] 5 cmH20 Pressure Support:  [14 cmH20] 14 cmH20 Plateau Pressure:  [13 cmH20-19 cmH20] 13 cmH20   Intake/Output Summary (Last 24 hours) at 02/13/2020 1011 Last data filed at 02/13/2020 0700 Gross per 24 hour  Intake 531.59 ml  Output 1600 ml  Net -1068.41 ml   Filed Weights   01/29/20 0700 02/13/20 0700  Weight: 53.7 kg 54.4 kg    Examination: General:  Frail elderly female on vent.  HENT: NCAT trach in place.  PULM: Rhonchi bilaterally.  CV: RRR, no mgr GI: BS+, soft, nontender MSK: normal bulk and tone Neuro: [facility] Problem list     Assessment & Plan:   Acute embolic CVA in R MCA and L cerebellum Secondary stroke prevention and hypertension management per neurology  Acute respiratory failure with hypoxemia, not weaning well on vent due to weakness, inability to clear secretions, complicated by pleural effusion S/p tracheostomy on 10/20 Routine trach care.  VAP bundle Daily SBT TOC team following for LTAC, will need total 21 days  on vent to qualify, currently at day 15.   Adenocarinoma of the lung with malignant left pleural effusion CXR prn Currently doesn't need chest tube Will likely recur, at that point place pleurex if family still desires full scope of treatment  Staph hominis in blood cultures - Vancomycin 14 day course  Hypertension - monitor  HIV/AIDS - Continue tivacay and descovy -  atovaquone  DM2 with hyperglycemia - SSI - Levemir increase to 12 units BID  Nutrition needs Tube feeding via cor-track Involve IR early next week for PEG.   Normocytic anemia Monitor for bleeding Transfuse PRBC for Hgb < 7 gm/dL  Prognosis: poor; likelihood of survival to 6 months is < 5%.  I feel she should be home on hospice.  The family did not agree and insisted on aggressive measures, hence the tracheostomy placed today. Would pursue LTACH placement as their goal is for her to rehab and make it out to be evaluated for lung cancer treatment  Best practice:  Diet: tube feeding Pain/Anxiety/Delirium protocol (if indicated): yes, RASS 0 VAP protocol (if indicated): yes DVT prophylaxis: lovenox GI prophylaxis: Pantoprazole for stress ulcer prophylaxis Glucose control: SSI Mobility: bed rest Code Status: full Family Communication: Message left for daughter to call back.  Disposition: LTACH  Labs   CBC: Recent Labs  Lab 02/07/20 0502 02/09/20 6073 02/11/20 0433 02/11/20 1402 02/12/20 0520  WBC 15.9* 14.2* 15.6* 13.6* 14.2*  HGB 7.6* 7.6* 6.9* 8.4* 8.8*  HCT 24.0* 24.2* 22.2* 26.1* 27.7*  MCV 89.9 91.7 92.1 91.6 91.1  PLT 354 430* 502* 421* 509*    Basic Metabolic Panel: Recent Labs  Lab 02/07/20 0502 02/09/20 0632 02/12/20 0520 02/13/20 0638  NA 139 137 135 135  K 3.8 4.3 4.0 4.3  CL 105 104 104 104  CO2 26 25 23 23   GLUCOSE 169* 166* 174* 229*  BUN 17 19 20 18   CREATININE 0.92 0.83 0.83 0.95  CALCIUM 7.5* 7.7* 7.8* 7.9*  MG 2.0  --  1.8  --   PHOS  --   --  2.2*  --    GFR: Estimated Creatinine Clearance: 35.1 mL/min (by C-G formula based on SCr of 0.95 mg/dL). Recent Labs  Lab 02/09/20 0632 02/11/20 0433 02/11/20 1402 02/12/20 0520  WBC 14.2* 15.6* 13.6* 14.2*    Liver Function Tests: Recent Labs  Lab 02/13/20 0638  AST 43*  ALT 62*  ALKPHOS 76  BILITOT 0.5  PROT 6.4*  ALBUMIN 1.4*   No results for input(s): LIPASE, AMYLASE in the  last 168 hours. No results for input(s): AMMONIA in the last 168 hours.  ABG    Component Value Date/Time   PHART 7.419 01/30/2020 1042   PCO2ART 26.5 (L) 01/30/2020 1042   PO2ART 143 (H) 01/30/2020 1042   HCO3 17.3 (L) 01/30/2020 1042   TCO2 18 (L) 01/30/2020 1042   ACIDBASEDEF 7.0 (H) 01/30/2020 1042   O2SAT 99.0 01/30/2020 1042     Coagulation Profile: Recent Labs  Lab 02/11/20 0433  INR 1.3*    Cardiac Enzymes: No results for input(s): CKTOTAL, CKMB, CKMBINDEX, TROPONINI in the last 168 hours.  HbA1C: Hgb A1c MFr Bld  Date/Time Value Ref Range Status  01/29/2020 04:22 PM 6.5 (H) 4.8 - 5.6 % Final    Comment:    (NOTE) Pre diabetes:          5.7%-6.4%  Diabetes:              >6.4%  Glycemic control for   <  7.0% adults with diabetes     CBG: Recent Labs  Lab 02/12/20 1534 02/12/20 1918 02/12/20 2310 02/13/20 0310 02/13/20 0732  GLUCAP 117* 148* 123* 176* 190*     Critical care time: 35 minutes     Georgann Housekeeper, AGACNP-BC [place] for personal pager PCCM on call pager [phone]  02/13/2020 10:38 AM

## 2020-02-13 NOTE — Progress Notes (Signed)
Patient placed on vent wean 15/5 by CCM.

## 2020-02-13 NOTE — Progress Notes (Signed)
Patient failed wean due to RR 48, and placed back on full vent support.

## 2020-02-14 DIAGNOSIS — I63511 Cerebral infarction due to unspecified occlusion or stenosis of right middle cerebral artery: Secondary | ICD-10-CM | POA: Diagnosis not present

## 2020-02-14 DIAGNOSIS — Z978 Presence of other specified devices: Secondary | ICD-10-CM | POA: Diagnosis not present

## 2020-02-14 DIAGNOSIS — C801 Malignant (primary) neoplasm, unspecified: Secondary | ICD-10-CM | POA: Diagnosis not present

## 2020-02-14 DIAGNOSIS — J9601 Acute respiratory failure with hypoxia: Secondary | ICD-10-CM | POA: Diagnosis not present

## 2020-02-14 LAB — CBC
HCT: 27.8 % — ABNORMAL LOW (ref 36.0–46.0)
Hemoglobin: 8.9 g/dL — ABNORMAL LOW (ref 12.0–15.0)
MCH: 29.9 pg (ref 26.0–34.0)
MCHC: 32 g/dL (ref 30.0–36.0)
MCV: 93.3 fL (ref 80.0–100.0)
Platelets: 529 10*3/uL — ABNORMAL HIGH (ref 150–400)
RBC: 2.98 MIL/uL — ABNORMAL LOW (ref 3.87–5.11)
RDW: 14.5 % (ref 11.5–15.5)
WBC: 11.5 10*3/uL — ABNORMAL HIGH (ref 4.0–10.5)
nRBC: 0 % (ref 0.0–0.2)

## 2020-02-14 LAB — GLUCOSE, CAPILLARY
Glucose-Capillary: 173 mg/dL — ABNORMAL HIGH (ref 70–99)
Glucose-Capillary: 171 mg/dL — ABNORMAL HIGH (ref 70–99)
Glucose-Capillary: 162 mg/dL — ABNORMAL HIGH (ref 70–99)
Glucose-Capillary: 148 mg/dL — ABNORMAL HIGH (ref 70–99)
Glucose-Capillary: 183 mg/dL — ABNORMAL HIGH (ref 70–99)
Glucose-Capillary: 220 mg/dL — ABNORMAL HIGH (ref 70–99)

## 2020-02-14 LAB — BASIC METABOLIC PANEL
Anion gap: 6 (ref 5–15)
BUN: 18 mg/dL (ref 8–23)
CO2: 25 mmol/L (ref 22–32)
Calcium: 8.2 mg/dL — ABNORMAL LOW (ref 8.9–10.3)
Chloride: 104 mmol/L (ref 98–111)
Creatinine, Ser: 0.83 mg/dL (ref 0.44–1.00)
GFR, Estimated: >60 mL/min (ref >60)
Glucose, Bld: 150 mg/dL — ABNORMAL HIGH (ref 70–99)
Potassium: 4 mmol/L (ref 3.5–5.1)
Sodium: 135 mmol/L (ref 135–145)

## 2020-02-14 NOTE — Progress Notes (Signed)
Pharmacy Antibiotic Note  Amber Lopez is a 79 y.o. female admitted on 01/29/2020 with sepsis/PNA.  Pharmacy has been consulted for vancomycin dosing for Staph bacteremia.  Renal function stable, afebrile, WBC trending down.  Plan: Continue vanc 1250mg  IV Q24H through 02/18/20 Monitor renal fxn, clinical progress, vanc trough at new Css   Height: 4\' 10"  (147.3 cm) Weight: 54.4 kg (119 lb 14.9 oz) IBW/kg (Calculated) : 40.9  Temp (24hrs), Avg:99.2 F (37.3 C), Min:98.2 F (36.8 C), Max:99.9 F (37.7 C)  Recent Labs  Lab 02/07/20 1124 02/09/20 0632 02/11/20 0433 02/11/20 1402 02/12/20 0520 02/13/20 0638 02/14/20 0413  WBC  --  14.2* 15.6* 13.6* 14.2*  --  11.5*  CREATININE  --  0.83  --   --  0.83 0.95 0.83  VANCOTROUGH 14*  --   --  22*  --   --   --     Estimated Creatinine Clearance: 40.2 mL/min (by C-G formula based on SCr of 0.83 mg/dL).    Allergies  Allergen Reactions  . Sulfa Antibiotics Swelling and Rash    HIV on Biktarvy PTA (CD4 242, VL 15 Aug 2019) - CD4 down to 185 Change to [place] in the short term for PT admin Atovaquone 10/14 >>   Cefepime 10/14 >> 10/20 Vanc 10/14 >> (10/27)  10/16 VT = 14 >> incr to 750mg  q12h 10/20 VT = 22 >> decr to 1250mg  q24  10/9 pleural fluid - negative 10/11 BCx - Staph hominis 10/14 BCx 2 - Staph hominis 1/4 10/14 TA - few Stenotrophomonas (pan-s)   Cashus Halterman D. Mina Marble, PharmD, BCPS, [place] 02/14/2020, 9:09 AM

## 2020-02-14 NOTE — Progress Notes (Signed)
STROKE TEAM PROGRESS NOTE   INTERVAL HISTORY No family at bedside. Stable, no events overnight.  Vitals:   02/14/20 0300 02/14/20 0400 02/14/20 0500 02/14/20 0600  BP: (!) 150/57 (!) 107/48 (!) 114/47 138/60  Pulse: 75 80 78 85  Resp: (!) 21 18 (!) 23 (!) 24  Temp:      TempSrc:      SpO2: 100% 100% 100% 100%  Weight:      Height:       CBC:  Recent Labs  Lab 02/12/20 0520 02/14/20 0413  WBC 14.2* 11.5*  HGB 8.8* 8.9*  HCT 27.7* 27.8*  MCV 91.1 93.3  PLT 509* 409*   Basic Metabolic Panel:  Recent Labs  Lab 02/12/20 0520 02/12/20 0520 02/13/20 0638 02/14/20 0413  NA 135   < > 135 135  K 4.0   < > 4.3 4.0  CL 104   < > 104 104  CO2 23   < > 23 25  GLUCOSE 174*   < > 229* 150*  BUN 20   < > 18 18  CREATININE 0.83   < > 0.95 0.83  CALCIUM 7.8*   < > 7.9* 8.2*  MG 1.8  --   --   --   PHOS 2.2*  --   --   --    < > = values in this interval not displayed.    IMAGING past 24 hours No results found.  PHYSICAL EXAM:  Temp:  [98.2 F (36.8 C)-99.9 F (37.7 C)] 99.6 F (37.6 C) (10/23 0000) Pulse Rate:  [75-109] 85 (10/23 0600) Resp:  [18-42] 24 (10/23 0600) BP: (98-160)/(46-76) 138/60 (10/23 0600) SpO2:  [96 %-100 %] 100 % (10/23 0600) FiO2 (%):  [30 %-40 %] 30 % (10/23 0156) Weight:  [54.4 kg] 54.4 kg (10/22 0700)  General - NAD, s/p trach on ventilator  Ophthalmologic - fundi not visualized due to noncooperation. PERRL.  Cardiovascular - Regular rhythm, slightly tachycardic  Neuro - s/p trach on vent, eyes spontaneous open however did not follow simple commands. Right gaze preference not cross midline.  Blinking to visual threat on the right but not on the left, able to track on the right visual field, PERRL. Corneal reflex present, gag and cough present. Breathing over the vent.    Left facial droop.  Tongue protrusion not cooperative. Spontaneous movement of RUE and localize to pain, withdraw RLE to pain 2+/5. Slight withdrawal LLE. LUE no spontaneous  movement, or withdraw to pain. no babinski. Sensation, coordination and gait not tested.   ASSESSMENT/PLAN Amber Lopez is a 79 y.o. female with history of depression, HLD, HIV, HTN and DM presenting with left hemiplegia, left facial droop, right eye deviation and aphasia. Received IV tPA 01/29/2020 at 0816. -> IR 01/29/20  Stroke:  R MCA and small L cerebellar infarcts s/p tPA + IR R M1 occlusion w/ TICI2c revascularization, embolic secondary to unknown source possibly hypercoagulability from lung cancer  Code Stroke CT head No acute abnormality. Atrophy. ASPECTS 10.     CTA head & neck LVO R M1. Atherosclerosis. L superior sulcus mass w/ large pleural effusion malignant appearing w/ associated mediastinal adenopathy.  Cerebral angio / IR - R M1 occlusion s/p TICI2c revascularization   Post IR CT - no ICH, contrast stain R basal ganglia  MRI  R MCA multifocal infarct w/ mild petechial hemorrhage. Small L cerebellar infarct. Atrophy,  CT Head repeat 01/31/20 - Expected evolution of right MCA infarct. Stable  focal subarachnoid hemorrhage near the right MCA bifurcation.   CT head 10/11 stable w/ expected evolution infarct w/ slight increase in edema. Stable R SAH.   2D Echo EF 55 to 60%, large pleural effusion on the left  LDL 62  HgbA1c 6.5  VTE prophylaxis - Lovenox 40 mg sq daily   No antithrombotic prior to admission, ASA 81 mg daily started 01/31/20  Therapy recommendations:  SNF  Disposition: Pending  Acute Respiratory Failure  Secondary to stroke  Intubated for IR   CCM on board  Family discussed with palliative care and CCM, adamant about trach and PEG  S/p tracheostomy 10/20  Failed weaning   Consider LTACH (needs 21d of vent to qualify, today D#16)  CCM will follow intermittently for trach  CCM signed off 10/22 - may need consult hospitalist   Fever and leukocytosis  TMax 100.8->99.9  Leukocytosis 14.2->11.5  Blood Cx from  101/11 +Staph  hominis  CXR 02/07/20 Suspect mass in the medial left apex. Correlation with chest CT, ideally with intravenous contrast, to further evaluate this area may well be warranted. Small left pleural effusion with bibasilar .atelectasis.  Blood cultures 10/14 - 1/2 staph hominis - sensitive to Vancomycin   Sputum Cultures 10/14 - Stenotrophomonas Maltophilia - sensitive to Levofloxacin and trimeth / sulfa  On Vanc 10/14 >>  (plan 14d course)  Completed cefepime 10/14 >>10/20  Severe anemia  Acute blood loss anemia s/p IR groin bleeding. Sheath removed using quick clot and pressure held   Hb 13.2->9.2->8.2->6.5->PRBC...8.8->8.4->7.6->6.9->PRBC->8.4->8.8->8.9  S/p PRBC transfusion x 2  CT abd/pelvis no retroperitoneal hemorrhage  CBC monitoring   Adenocarcinoma of lung w/ malignant L pleural effusion  CTA neck - L superior sulcus mass w/ large pleural effusion malignant appearing w/ associated mediastinal adenopathy.  CT chest large left pleural effusion with near complete left lung collapse.  Left apical mass suspicious for bronchogenic carcinoma  CT abd/pelvis no distal metastasis disease  Lt chest tube placed 10/9 - Dr Thayer Jew - removed 10/21  Cytology of pleural fluid - Malignant cells consistent with metastatic adenocarcinoma   Oncology consult - stage IV adenocarcinoma of the lung, prognosis poor, not a candidate for aggressive tx - signed off 10/15  Palliative care on board - family wants trach/PEG  Prognosis poor   Possible Seizure 10/11  Worsening R gaze forced deviation in setting of HTN, no shaking or jerking - 10/11 am   EEG continuous slowing  Repeat CT head 02/02/2020 slight increase in infarct now involving right caudate head as well but otherwise stable mass-effect on no midline shift  Hypotension/Hypertension  Home meds:  hctz 25, metoprolol 25  Current - Labetalol prn  Off phenylephepdirine   Stable now . BP goal normotensive   Diabetes type II  Controlled  Home meds:  None listed  HgbA1c 6.5, goal < 7.0  CBGs  SSI  On levimir 10->12 bid  DB RN following    Dysphagia At risk malnutrition . Secondary to stroke . NPO . On TF  . Son and daughter agreed with PEG, Consulted trauma consult for PEG Saturday (will be seen Monday) .   HIV  On HARRP   CD4 in 07/2019 242  CD4 02/03/2020 185  Other Stroke Risk Factors  Advanced age  Migraines   Other Active Problems  Depression / anxiety  Hx right breast cancer 200 s/p lumpectomy and tamoxifen, recurred 2010 w/ resection, XRT, on arimidex PTA  Meeting with CCM, Palliative Care and family 10/19 - family  feels pt would want a tracheostomy and PEG tube and they want to maximize her time on this earth and give her a chance to recovery. (pt remains DNR)  Thrombocytosis - [facility] day # 19  Personally examined patient and images, and have participated in and made any corrections needed to history, physical, neuro exam,assessment and plan as stated above.  I have personally obtained the history, evaluated lab date, reviewed imaging studies and agree with radiology interpretations.    Sarina Ill, MD Stroke Neurology  I spent 35 minutes of face-to-face and non-face-to-face time with patient. This included prechart review, lab review, study review, order entry, electronic health record documentation, patient education on the different diagnostic and therapeutic options, counseling and coordination of care, risks and benefits of management, compliance, or risk factor reduction      o contact Stroke Continuity provider, please refer to [email or web address] After hours, contact General Neurology

## 2020-02-15 DIAGNOSIS — C3492 Malignant neoplasm of unspecified part of left bronchus or lung: Secondary | ICD-10-CM | POA: Diagnosis not present

## 2020-02-15 DIAGNOSIS — C801 Malignant (primary) neoplasm, unspecified: Secondary | ICD-10-CM | POA: Diagnosis not present

## 2020-02-15 LAB — BASIC METABOLIC PANEL
Anion gap: 9 (ref 5–15)
BUN: 19 mg/dL (ref 8–23)
CO2: 25 mmol/L (ref 22–32)
Calcium: 8.3 mg/dL — ABNORMAL LOW (ref 8.9–10.3)
Chloride: 101 mmol/L (ref 98–111)
Creatinine, Ser: 0.86 mg/dL (ref 0.44–1.00)
GFR, Estimated: >60 mL/min (ref >60)
Glucose, Bld: 182 mg/dL — ABNORMAL HIGH (ref 70–99)
Potassium: 4.7 mmol/L (ref 3.5–5.1)
Sodium: 135 mmol/L (ref 135–145)

## 2020-02-15 LAB — GLUCOSE, CAPILLARY
Glucose-Capillary: 135 mg/dL — ABNORMAL HIGH (ref 70–99)
Glucose-Capillary: 158 mg/dL — ABNORMAL HIGH (ref 70–99)
Glucose-Capillary: 113 mg/dL — ABNORMAL HIGH (ref 70–99)
Glucose-Capillary: 170 mg/dL — ABNORMAL HIGH (ref 70–99)
Glucose-Capillary: 150 mg/dL — ABNORMAL HIGH (ref 70–99)
Glucose-Capillary: 150 mg/dL — ABNORMAL HIGH (ref 70–99)

## 2020-02-15 LAB — CBC
HCT: 29.5 % — ABNORMAL LOW (ref 36.0–46.0)
Hemoglobin: 9.2 g/dL — ABNORMAL LOW (ref 12.0–15.0)
MCH: 29.6 pg (ref 26.0–34.0)
MCHC: 31.2 g/dL (ref 30.0–36.0)
MCV: 94.9 fL (ref 80.0–100.0)
Platelets: 640 10*3/uL — ABNORMAL HIGH (ref 150–400)
RBC: 3.11 MIL/uL — ABNORMAL LOW (ref 3.87–5.11)
RDW: 14.9 % (ref 11.5–15.5)
WBC: 9.6 10*3/uL (ref 4.0–10.5)
nRBC: 0 % (ref 0.0–0.2)

## 2020-02-15 NOTE — Progress Notes (Signed)
   02/15/20 0801  Vent Select  Invasive or Noninvasive Invasive  Adult Vent Y  Tracheostomy Shiley Legacy 6 mm Cuffed  Placement Date/Time: 02/11/20 1105   Placed By: ICU physician  Brand: Shiley Legacy  Size (mm): 6 mm  Style: Cuffed  Status Secured  Site Assessment Oozing secretions  Site Care Cleansed;Dried  Inner Cannula Care Changed/new  Ties Assessment Clean;Dry;Secure  Cuff pressure (cm) 30 cm  Tracheostomy Equipment at bedside Yes and checklist posted at head of bed  Adult Ventilator Settings  Vent Type Servo i  Humidity HME  Vent Mode PRVC  Vt Set 320 mL  Set Rate 16 bmp  FiO2 (%) 30 %  I Time 0.9 Sec(s)  PEEP 5 cmH20  Adult Ventilator Measurements  Peak Airway Pressure 20 L/min  Mean Airway Pressure 14 cmH20  Plateau Pressure 11 cmH20  Resp Rate Spontaneous 26 br/min  Resp Rate Total 45 br/min  Exhaled Vt 263 mL  Spont TV 352 mL  Measured Ve 9.8 mL  I:E Ratio Measured 3.5:1  Auto PEEP 0 cmH20  Total PEEP 5 cmH20  SpO2 100 %  Adult Ventilator Alarms  Alarms On Y  Ve High Alarm 18 L/min  Ve Low Alarm 4 L/min  Resp Rate High Alarm 42 br/min  Resp Rate Low Alarm 8  PEEP Low Alarm 3 cmH2O  Press High Alarm 40 cmH2O  VAP Prevention  Cuff pressure (initial) 18 cm H2O  Cuff pressure after change 30 cm H2O  HOB> 30 Degrees Y  Equipment wiped down Yes  Daily Weaning Assessment  Daily Assessment of Readiness to Wean Wean protocol criteria met (SBT performed)  Reason not met  (RR 45 on full support. pt left on FS settings at this time.)  Breath Sounds  Bilateral Breath Sounds Diminished;Rhonchi  Vent Respiratory Assessment  Patient Effort Fair  Level of Consciousness Responds to Pain  Respiratory Pattern Irregular;Unlabored;Symmetrical;Tachypnea;Shallow  Suction Method  Respiratory Interventions Airway suction;Oral suction  Airway Suctioning/Secretions  Suction Type Tracheal  Suction Device  Catheter  Secretion Amount Moderate  Secretion Color  White;Yellow  Secretion Consistency Thick  Suction Tolerance Tolerated well  Suctioning Adverse Effects None

## 2020-02-15 NOTE — Progress Notes (Signed)
STROKE TEAM PROGRESS NOTE   INTERVAL HISTORY No changes overnight  Vitals:   02/15/20 0600 02/15/20 0700 02/15/20 0800 02/15/20 0801  BP: 140/61 (!) 130/51 (!) 160/71 (!) 130/51  Pulse: 78 78 (!) 114 (!) 117  Resp: (!) 22 17 (!) 35 (!) 45  Temp:   99.2 F (37.3 C)   TempSrc:   Axillary   SpO2: 100% 100% 100% 100%  Weight:      Height:       CBC:  Recent Labs  Lab 02/12/20 0520 02/14/20 0413  WBC 14.2* 11.5*  HGB 8.8* 8.9*  HCT 27.7* 27.8*  MCV 91.1 93.3  PLT 509* 008*   Basic Metabolic Panel:  Recent Labs  Lab 02/12/20 0520 02/12/20 0520 02/13/20 0638 02/14/20 0413  NA 135   < > 135 135  K 4.0   < > 4.3 4.0  CL 104   < > 104 104  CO2 23   < > 23 25  GLUCOSE 174*   < > 229* 150*  BUN 20   < > 18 18  CREATININE 0.83   < > 0.95 0.83  CALCIUM 7.8*   < > 7.9* 8.2*  MG 1.8  --   --   --   PHOS 2.2*  --   --   --    < > = values in this interval not displayed.    IMAGING past 24 hours No results found.  PHYSICAL EXAM:   Temp:  [97.1 F (36.2 C)-100 F (37.8 C)] 99.2 F (37.3 C) (10/24 0800) Pulse Rate:  [72-117] 117 (10/24 0801) Resp:  [16-45] 45 (10/24 0801) BP: (99-160)/(40-80) 130/51 (10/24 0801) SpO2:  [97 %-100 %] 100 % (10/24 0801) FiO2 (%):  [30 %] 30 % (10/24 0801)  General - NAD, s/p trach on ventilator  Ophthalmologic - fundi not visualized due to noncooperation. PERRL.  Cardiovascular - Regular rhythm, slightly tachycardic  Neuro - s/p trach on vent, eyes spontaneous open however did not follow simple commands. Right gaze preference not cross midline.  Blinking to visual threat on the right but not on the left, able to track on the right visual field, PERRL. Corneal reflex present, gag and cough present. Breathing over the vent.    Left facial droop.  Tongue protrusion not cooperative. Spontaneous movement of RUE and localize to pain, will hold up right arm antigravity with help, withdraw RLE to pain 2+/5. Slight withdrawal LLE. LUE no  spontaneous movement, or withdraw to pain. no babinski. Sensation, coordination and gait not tested.   ASSESSMENT/PLAN Amber Lopez is a 79 y.o. female with history of depression, HLD, HIV, HTN and DM presenting with left hemiplegia, left facial droop, right eye deviation and aphasia. Received IV tPA 01/29/2020 at 0816. -> IR 01/29/20  Stroke:  R MCA and small L cerebellar infarcts s/p tPA + IR R M1 occlusion w/ TICI2c revascularization, embolic secondary to unknown source possibly hypercoagulability from lung cancer  Code Stroke CT head No acute abnormality. Atrophy. ASPECTS 10.     CTA head & neck LVO R M1. Atherosclerosis. L superior sulcus mass w/ large pleural effusion malignant appearing w/ associated mediastinal adenopathy.  Cerebral angio / IR - R M1 occlusion s/p TICI2c revascularization   Post IR CT - no ICH, contrast stain R basal ganglia  MRI  R MCA multifocal infarct w/ mild petechial hemorrhage. Small L cerebellar infarct. Atrophy,  CT Head repeat 01/31/20 - Expected evolution of right MCA infarct. Stable  focal subarachnoid hemorrhage near the right MCA bifurcation.   CT head 10/11 stable w/ expected evolution infarct w/ slight increase in edema. Stable R SAH.   2D Echo EF 55 to 60%, large pleural effusion on the left  LDL 62  HgbA1c 6.5  VTE prophylaxis - Lovenox 40 mg sq daily   No antithrombotic prior to admission, ASA 81 mg daily started 01/31/20  Therapy recommendations:  SNF  Disposition: Pending  Acute Respiratory Failure  Secondary to stroke  Intubated for IR   CCM on board  Family discussed with palliative care and CCM, adamant about trach and PEG  S/p tracheostomy 10/20  Failed weaning   Consider LTACH (needs 21d of vent to qualify, today D#17)  CCM will follow intermittently for trach  CCM signed off 10/22 - may need to consult hospitalist   Fever and leukocytosis  TMax 100.8->99.9->99.2 ax  Leukocytosis 14.2->11.5 (CBC  pending)  Blood Cx from  101/11 +Staph hominis  CXR 02/07/20 Suspect mass in the medial left apex. Correlation with chest CT, ideally with intravenous contrast, to further evaluate this area may well be warranted. Small left pleural effusion with bibasilar .atelectasis.  Blood cultures 10/14 - 1/2 staph hominis - sensitive to Vancomycin   Sputum Cultures 10/14 - Stenotrophomonas Maltophilia - sensitive to Levofloxacin and trimeth / sulfa  On Vanc 10/14 >>  (plan 14d course)  Completed cefepime 10/14 >>10/20  Severe anemia  Acute blood loss anemia s/p IR groin bleeding. Sheath removed using quick clot and pressure held   Hb 13.2->9.2->8.2->6.5->PRBC...8.8->8.4->7.6->6.9->PRBC->8.4->8.8->8.9 (CBC pending)  S/p PRBC transfusion x 2  CT abd/pelvis no retroperitoneal hemorrhage  CBC monitoring   Adenocarcinoma of lung w/ malignant L pleural effusion  CTA neck - L superior sulcus mass w/ large pleural effusion malignant appearing w/ associated mediastinal adenopathy.  CT chest large left pleural effusion with near complete left lung collapse.  Left apical mass suspicious for bronchogenic carcinoma  CT abd/pelvis no distal metastasis disease  Lt chest tube placed 10/9 - Dr Thayer Jew - removed 10/21  Cytology of pleural fluid - Malignant cells consistent with metastatic adenocarcinoma   Oncology consult - stage IV adenocarcinoma of the lung, prognosis poor, not a candidate for aggressive tx - signed off 10/15  Palliative care on board - family wants trach/PEG  Prognosis poor   Possible Seizure 10/11  Worsening R gaze forced deviation in setting of HTN, no shaking or jerking - 10/11 am   EEG continuous slowing  Repeat CT head 02/02/2020 slight increase in infarct now involving right caudate head as well but otherwise stable mass-effect on no midline shift  Hypotension/Hypertension  Home meds:  hctz 25, metoprolol 25  Current - Labetalol prn  Off phenylephepdirine    Stable now . BP goal normotensive   Diabetes type II Controlled  Home meds:  None listed  HgbA1c 6.5, goal < 7.0  CBGs  SSI  On levimir 10->12 bid  DB RN following    Dysphagia At risk malnutrition . Secondary to stroke . NPO . On TF  . Son and daughter agreed with PEG, Consulted trauma consult for PEG Saturday (will be seen Monday)  HIV  On HARRP   CD4 in 07/2019 242  CD4 02/03/2020 185  Other Stroke Risk Factors  Advanced age  Migraines   Other Active Problems  Depression / anxiety  Hx right breast cancer 200 s/p lumpectomy and tamoxifen, recurred 2010 w/ resection, XRT, on arimidex PTA  Meeting with CCM, Palliative Care and  family 10/19 - family feels pt would want a tracheostomy and PEG tube and they want to maximize her time on this earth and give her a chance to recovery. (pt remains DNR)  Thrombocytosis - 529  Daily CBC and Bmet - (pending today)  Hospital day # 17  Personally examined patient and images, and have participated in and made any corrections needed to history, physical, neuro exam,assessment and plan as stated above.  I have personally obtained the history, evaluated lab date, reviewed imaging studies and agree with radiology interpretations.    Sarina Ill, MD Stroke Neurology  I spent 25 minutes of face-to-face and non-face-to-face time with patient. This included prechart review, lab review, study review, order entry, electronic health record documentation, patient education on the different diagnostic and therapeutic options, counseling and coordination of care, risks and benefits of management, compliance, or risk factor reduction      I spent 35 minutes of face-to-face and non-face-to-face time with patient. This included prechart review, lab review, study review, order entry, electronic health record documentation, patient education on the different diagnostic and therapeutic options, counseling and coordination of care,  risks and benefits of management, compliance, or risk factor reduction      o contact Stroke Continuity provider, please refer to [email or web address] After hours, contact General Neurology

## 2020-02-16 DIAGNOSIS — C801 Malignant (primary) neoplasm, unspecified: Secondary | ICD-10-CM | POA: Diagnosis not present

## 2020-02-16 DIAGNOSIS — Z9911 Dependence on respirator [ventilator] status: Secondary | ICD-10-CM | POA: Diagnosis not present

## 2020-02-16 DIAGNOSIS — Z930 Tracheostomy status: Secondary | ICD-10-CM

## 2020-02-16 DIAGNOSIS — Z515 Encounter for palliative care: Secondary | ICD-10-CM | POA: Diagnosis not present

## 2020-02-16 DIAGNOSIS — J9601 Acute respiratory failure with hypoxia: Secondary | ICD-10-CM | POA: Diagnosis not present

## 2020-02-16 DIAGNOSIS — I63511 Cerebral infarction due to unspecified occlusion or stenosis of right middle cerebral artery: Secondary | ICD-10-CM | POA: Diagnosis not present

## 2020-02-16 DIAGNOSIS — C3492 Malignant neoplasm of unspecified part of left bronchus or lung: Secondary | ICD-10-CM | POA: Diagnosis not present

## 2020-02-16 DIAGNOSIS — E440 Moderate protein-calorie malnutrition: Secondary | ICD-10-CM

## 2020-02-16 LAB — BASIC METABOLIC PANEL
Anion gap: 8 (ref 5–15)
BUN: 21 mg/dL (ref 8–23)
CO2: 25 mmol/L (ref 22–32)
Calcium: 8.5 mg/dL — ABNORMAL LOW (ref 8.9–10.3)
Chloride: 102 mmol/L (ref 98–111)
Creatinine, Ser: 0.9 mg/dL (ref 0.44–1.00)
GFR, Estimated: >60 mL/min (ref >60)
Glucose, Bld: 194 mg/dL — ABNORMAL HIGH (ref 70–99)
Potassium: 4.5 mmol/L (ref 3.5–5.1)
Sodium: 135 mmol/L (ref 135–145)

## 2020-02-16 LAB — CBC
HCT: 28.8 % — ABNORMAL LOW (ref 36.0–46.0)
Hemoglobin: 9 g/dL — ABNORMAL LOW (ref 12.0–15.0)
MCH: 30.1 pg (ref 26.0–34.0)
MCHC: 31.3 g/dL (ref 30.0–36.0)
MCV: 96.3 fL (ref 80.0–100.0)
Platelets: 626 10*3/uL — ABNORMAL HIGH (ref 150–400)
RBC: 2.99 MIL/uL — ABNORMAL LOW (ref 3.87–5.11)
RDW: 15 % (ref 11.5–15.5)
WBC: 8.7 10*3/uL (ref 4.0–10.5)
nRBC: 0 % (ref 0.0–0.2)

## 2020-02-16 LAB — GLUCOSE, CAPILLARY
Glucose-Capillary: 147 mg/dL — ABNORMAL HIGH (ref 70–99)
Glucose-Capillary: 185 mg/dL — ABNORMAL HIGH (ref 70–99)
Glucose-Capillary: 103 mg/dL — ABNORMAL HIGH (ref 70–99)
Glucose-Capillary: 106 mg/dL — ABNORMAL HIGH (ref 70–99)
Glucose-Capillary: 161 mg/dL — ABNORMAL HIGH (ref 70–99)
Glucose-Capillary: 192 mg/dL — ABNORMAL HIGH (ref 70–99)

## 2020-02-16 LAB — VANCOMYCIN, TROUGH: Vancomycin Tr: 17 ug/mL (ref 15–20)

## 2020-02-16 NOTE — Progress Notes (Signed)
Pharmacy Antibiotic Note  Amber Lopez is a 79 y.o. female admitted on 01/29/2020 with sepsis/PNA.  Pharmacy has been consulted for vancomycin dosing for Staph bacteremia.  Renal function stable, afebrile, WBC trending down.  Vancomycin random level = 17 - drawn about 2 hrs late, but estimate true trough likely < 20.  Plan: Continue vanc 1250mg  IV Q24H through 02/18/20 Monitor renal fxn, clinical progress, vanc trough at new Css   Height: 4\' 10"  (147.3 cm) Weight: 54.4 kg (119 lb 14.9 oz) IBW/kg (Calculated) : 40.9  Temp (24hrs), Avg:99.4 F (37.4 C), Min:98.8 F (37.1 C), Max:100.3 F (37.9 C)  Recent Labs  Lab 02/11/20 1402 02/12/20 0520 02/13/20 2446 02/14/20 0413 02/15/20 1630 02/16/20 0611 02/16/20 1246  WBC 13.6* 14.2*  --  11.5* 9.6 8.7  --   CREATININE  --  0.83 0.95 0.83 0.86 0.90  --   VANCOTROUGH 22*  --   --   --   --   --  17    Estimated Creatinine Clearance: 37 mL/min (by C-G formula based on SCr of 0.9 mg/dL).    Allergies  Allergen Reactions  . Sulfa Antibiotics Swelling and Rash    HIV on Biktarvy PTA (CD4 242, VL 15 Aug 2019) - CD4 down to 185 Change to [place] in the short term for PT admin Atovaquone 10/14 >>   Cefepime 10/14 >> 10/20 Vanc 10/14 >> (10/27)  10/16 VT = 14 >> incr to 750mg  q12h 10/20 VT = 22 >> decr to 1250mg  q24  10/9 pleural fluid - negative 10/11 BCx - Staph hominis 10/14 BCx 2 - Staph hominis 1/4 10/14 TA - few Stenotrophomonas (pan-s)    [place] Crane, Vena [place], BCPS, [facility] Clinical Pharmacist  02/16/2020 3:56 PM   [facility] pharmacy phone numbers are listed on [email or web address]

## 2020-02-16 NOTE — Progress Notes (Signed)
NAME:  Amber Lopez, MRN:  [id number], DOB:  Aug 24, 1940, LOS: 68 ADMISSION DATE:  01/29/2020, CONSULTATION DATE:  10/7 REFERRING MD:  Estanislado Pandy, CHIEF COMPLAINT:  Left Sided Weakness   Brief History   79 y/o F, with HIV, admitted 10/7 with LVO of R M1 s/p tPA, neuro IR revascularization.  Returned to ICU post procedure on mechanical ventilation. Additional finding of left superior sulcus mass and complex effusion.   Past Medical History  HIV/AIDS, HTN, HLD, DM, Depression , Arthritis, Breast Cancer   Significant Hospital Events   10/07 Admit with L hemiplegia, facial droop, aphasia with right eye deviation 10/11 Episode of worsened R eye deviation with tachycardia and HTN, concern for sz   Consults:  Neurology, IR  Oncology 10/13 Palliative care 10/13  Procedures:  ETT 10/7 >> 10/20 L Radial ALine 10/7 >>  out R Femoral Sheath 10/7 >> out 10/20 tracheostomy >   Significant Diagnostic Tests:  10/7 CT Head Code Stroke >> no acute finding, generalized atrophy  10/7 CTA Head/Neck >> emergent LVO at the right M1 segment, no visible embolic source, atherosclerosis without flow limiting stenosis or ulceration of major vessels, left superior sulcus mass with large complex pleural effusion, associated mediastinal adenopathy  10/7 CT ABD w/contrast >> Negative for hematoma 10/7 CT Chest w/contrast >> Persistent large left pleural effusion with near complete left lung collapse. Left apical mass measuring 3.5 x 3.3 cm suspicious for bronchogenic carcinoma 10/11 CT head>>no acute findings 10/11 EEG>>Continuous slow, generalized and maximal right frontotemporal region  Micro Data:  COVID 10/7 >> negative  Influenza A/B 10/7 >> negative Pleural fluid culture>>no growth three days 10/9 BCx2>> 1/2 with gram positives 10/7 MRSA screen>>negative 10/11 BC x 1 (from left PICC) > staph hominis 1/2 10/14 trach asp >> few WBC, rare G+ cocci>> few stenotrophomonas maltophilia S to levaquin and  bactrim  10/14 BC >> negative  Antimicrobials:  10/7 cefazolin 10/8 dolutegravir/ emtricitabine/ tenofovir > 10/14 vanc > 10/14 cefepime > 10/20  Interim history/subjective:  No acute event overnight. Failed vent wean after 1 hour.   Objective   Blood pressure 126/66, pulse 66, temperature 100 F (37.8 C), temperature source Oral, resp. rate 17, height 4\' 10"  (1.473 m), weight 54.4 kg, SpO2 100 %.    Vent Mode: PRVC FiO2 (%):  [30 %] 30 % Set Rate:  [16 bmp] 16 bmp Vt Set:  [320 mL] 320 mL PEEP:  [5 cmH20] 5 cmH20 Plateau Pressure:  [25 cmH20] 25 cmH20   Intake/Output Summary (Last 24 hours) at 02/16/2020 1030 Last data filed at 02/16/2020 0600 Gross per 24 hour  Intake 600.09 ml  Output 500 ml  Net 100.09 ml   Filed Weights   01/29/20 0700 02/13/20 0700  Weight: 53.7 kg 54.4 kg    Examination: Frail elderly lady On vent Clear breath sounds anteriorly S1 S2 appreciated Bowel sounds appreciated  Resolved Hospital Problem list     Assessment & Plan:  Acute embolic CVA in right MCA and left cerebellum Secondary stroke prevention and hypertension management per neurology  Acute respiratory failure with hypoxemia, not weaning Inability to clear secretions Pleural effusion Status post tracheostomy on 10/20 -Weaning as tolerated -Transition of care team to follow for LTAC placement -We will need 21 days of vent management to qualify for LTAC currently day 16  Adenocarcinoma of the lung with malignant pleural effusion -Continue to monitor closely  Staph hominis in blood culture -Vancomycin for 14 days  Hypertension -Continue to monitor  HIV/AIDS -Continue tivacay and Descovy -Atovaquone  Type 2 diabetes and hyperglycemia -SSI -Levemir increased to 12 units twice daily  Nutrition needs Tube feeding via cor-track Involve IR early next week for PEG.   Normocytic anemia Monitor for bleeding Transfuse per protocol    Prognosis: poor; likelihood  of survival to 6 months is < 5%.  I feel she should be home on hospice.  The family did not agree and insisted on aggressive measures, hence the tracheostomy placed today. Would pursue LTACH placement as their goal is for her to rehab and make it out to be evaluated for lung cancer treatment  Best practice:  Diet: tube feeding  Pain/Anxiety/Delirium protocol (if indicated): yes, RASS 0 VAP protocol (if indicated): yes DVT prophylaxis: lovenox GI prophylaxis: Pantoprazole for stress ulcer prophylaxis Glucose control: SSI Mobility: bed rest Code Status: full Family Communication: Discussed with daughter at bedside Disposition: LTACH  Labs   CBC: Recent Labs  Lab 02/11/20 1402 02/12/20 0520 02/14/20 0413 02/15/20 1630 02/16/20 0611  WBC 13.6* 14.2* 11.5* 9.6 8.7  HGB 8.4* 8.8* 8.9* 9.2* 9.0*  HCT 26.1* 27.7* 27.8* 29.5* 28.8*  MCV 91.6 91.1 93.3 94.9 96.3  PLT 421* 509* 529* 640* 626*    Basic Metabolic Panel: Recent Labs  Lab 02/12/20 0520 02/13/20 0638 02/14/20 0413 02/15/20 1630 02/16/20 0611  NA 135 135 135 135 135  K 4.0 4.3 4.0 4.7 4.5  CL 104 104 104 101 102  CO2 23 23 25 25 25   GLUCOSE 174* 229* 150* 182* 194*  BUN 20 18 18 19 21   CREATININE 0.83 0.95 0.83 0.86 0.90  CALCIUM 7.8* 7.9* 8.2* 8.3* 8.5*  MG 1.8  --   --   --   --   PHOS 2.2*  --   --   --   --    GFR: Estimated Creatinine Clearance: 37 mL/min (by C-G formula based on SCr of 0.9 mg/dL). Recent Labs  Lab 02/12/20 0520 02/14/20 0413 02/15/20 1630 02/16/20 0611  WBC 14.2* 11.5* 9.6 8.7    Liver Function Tests: Recent Labs  Lab 02/13/20 0638  AST 43*  ALT 62*  ALKPHOS 76  BILITOT 0.5  PROT 6.4*  ALBUMIN 1.4*   No results for input(s): LIPASE, AMYLASE in the last 168 hours. No results for input(s): AMMONIA in the last 168 hours.  ABG    Component Value Date/Time   PHART 7.419 01/30/2020 1042   PCO2ART 26.5 (L) 01/30/2020 1042   PO2ART 143 (H) 01/30/2020 1042   HCO3 17.3 (L)  01/30/2020 1042   TCO2 18 (L) 01/30/2020 1042   ACIDBASEDEF 7.0 (H) 01/30/2020 1042   O2SAT 99.0 01/30/2020 1042     Coagulation Profile: Recent Labs  Lab 02/11/20 0433  INR 1.3*    Cardiac Enzymes: No results for input(s): CKTOTAL, CKMB, CKMBINDEX, TROPONINI in the last 168 hours.  HbA1C: Hgb A1c MFr Bld  Date/Time Value Ref Range Status  01/29/2020 04:22 PM 6.5 (H) 4.8 - 5.6 % Final    Comment:    (NOTE) Pre diabetes:          5.7%-6.4%  Diabetes:              >6.4%  Glycemic control for   <7.0% adults with diabetes     CBG: Recent Labs  Lab 02/15/20 1632 02/15/20 1935 02/15/20 2326 02/16/20 0317 02/16/20 0748  GLUCAP 170* 150* 150* 147* 185*   The patient is critically ill with multiple organ systems failure and  requires high complexity decision making for assessment and support, frequent evaluation and titration of therapies, application of advanced monitoring technologies and extensive interpretation of multiple databases. Critical Care Time devoted to patient care services described in this note independent of APP/resident time (if applicable)  is 32 minutes.   Sherrilyn Rist MD [facility] Personal pager: [phone] If unanswered, please page CCM On-call: [phone]

## 2020-02-16 NOTE — Progress Notes (Signed)
Patient ID: Amber Lopez, female   DOB: 1940/06/05, 79 y.o.   MRN: [id number]  This NP visited patient at the bedside as a follow up for continued assessment of palliative medicine needs and emotional support to family.  Patient's daughter/[name] at bedside,  currently in discussion with Dr. Ander Slade regarding current medical situation.   Education offered regarding diagnosis, prognosis, treatment option decisions, advanced directive decisions and anticipatory care needs. Discussion was then had with PA/Kelly from [place] [facility] regarding PEG tube placement.  Created space and opportunity for family to explore their thoughts and feelings regarding their mother's current medical situation.   Daughter verbalizes her "frustration" with providers feeling that "everyone just wants my mother to die"  Therapeutic listening and emotional support.  Conversation had regarding perceptions around quality of life and perceived suffering.  Family does not see patient's current medical situation as suffering and are open to all offered and available medical interventions to prolong life.  Education offered on the patient's terminal diagnosis of lung cancer, and my concern  that the limitations of medical interventions to prolong quality of life when the body begins to fail to thrive.  Patient's daughter then tells me that she wishes her mother to be transferred to any other tertiary hospital.  She is not "confident" in the care her mother is receiving here today. Dr Ander Slade has attempted to help family with this request but unfortunately [facility] and [facility] are unable to offer a bed.      Discussed with  Dr Ander Slade and [facility] team  I have a follow-up meeting scheduled with both patient's daughter and son on Wednesday at 2 :00.   Understanding that  the family is clear in  desire to continue with all aggressive measures  we should move forward with PEG tube placement as soon as possible. Made trauma PA/ [facility] [employer]  aware that surgery can move forward with PEG placement.  Questions and concerns addressed      Total time spent on the unit was 60 minutes  PMT will continue to support holistically  Greater than 50% of the time was spent in counseling and coordination of care  Wadie Lessen NP  Palliative Medicine Team Team Phone # [phone] Pager [phone]

## 2020-02-16 NOTE — Progress Notes (Signed)
STROKE TEAM PROGRESS NOTE   INTERVAL HISTORY Patient neurological condition and exam remain unchanged and she is awake but remains not interactive with right gaze preference and left hemiplegia with purposeful movements on the right.  She now has a tracheostomy but attempts to wean her to trach collar have not been successful.  Family wants a PEG tube and trauma team has been consulted.  Family agrees to DNR but would like continue ongoing aggressive care despite multiple meetings with neurology, critical care oncology, and palliative care teams.  Family continues to have unrealistic expectations that patient is going to improve with aggressive care and prolonged rehab and then undergo treatment for her stage IV lung cancer  Vitals:   02/16/20 0600 02/16/20 0715 02/16/20 0800 02/16/20 0900  BP: (!) 131/56 (!) 136/58 (!) 129/53 126/66  Pulse: 77 73 71 66  Resp: (!) 23 19 20 17   Temp:   100 F (37.8 C)   TempSrc:   Oral   SpO2: 100% 100% 100% 100%  Weight:      Height:       CBC:  Recent Labs  Lab 02/15/20 1630 02/16/20 0611  WBC 9.6 8.7  HGB 9.2* 9.0*  HCT 29.5* 28.8*  MCV 94.9 96.3  PLT 640* 628*   Basic Metabolic Panel:  Recent Labs  Lab 02/12/20 0520 02/13/20 0638 02/15/20 1630 02/16/20 0611  NA 135   < > 135 135  K 4.0   < > 4.7 4.5  CL 104   < > 101 102  CO2 23   < > 25 25  GLUCOSE 174*   < > 182* 194*  BUN 20   < > 19 21  CREATININE 0.83   < > 0.86 0.90  CALCIUM 7.8*   < > 8.3* 8.5*  MG 1.8  --   --   --   PHOS 2.2*  --   --   --    < > = values in this interval not displayed.    IMAGING past 24 hours No results found.  PHYSICAL EXAM:     Temp:  [98.8 F (37.1 C)-100.3 F (37.9 C)] 100 F (37.8 C) (10/25 0800) Pulse Rate:  [66-104] 66 (10/25 0900) Resp:  [17-31] 17 (10/25 0900) BP: (103-153)/(44-80) 126/66 (10/25 0900) SpO2:  [99 %-100 %] 100 % (10/25 0900) FiO2 (%):  [30 %] 30 % (10/25 0348)  General - NAD, s/p trach on  ventilator  Ophthalmologic - fundi not visualized due to noncooperation. PERRL.  Cardiovascular - Regular rhythm, slightly tachycardic  Neuro - s/p trach on vent, eyes spontaneous open however did not follow simple commands. Right gaze preference but will not cross midline.  Blinking to visual threat on the right but not on the left, able to track on the right visual field, PERRL. Corneal reflex present, gag and cough present. Breathing over the vent.    Left facial droop.  Tongue protrusion not cooperative. Spontaneous movement of RUE and localize to pain, will hold up right arm antigravity with help, withdraw RLE to pain 2+/5. Slight withdrawal LLE. LUE no spontaneous movement, or withdraw to pain. no babinski. Sensation, coordination and gait not tested.   ASSESSMENT/PLAN Amber Lopez is a 79 y.o. female with history of depression, HLD, HIV, HTN and DM presenting with left hemiplegia, left facial droop, right eye deviation and aphasia. Received IV tPA 01/29/2020 at 0816. -> IR 01/29/20  Stroke:  R MCA and small L cerebellar infarcts s/p tPA +  IR R M1 occlusion w/ TICI2c revascularization, embolic secondary to unknown source possibly hypercoagulability from lung cancer  Code Stroke CT head No acute abnormality. Atrophy. ASPECTS 10.     CTA head & neck LVO R M1. Atherosclerosis. L superior sulcus mass w/ large pleural effusion malignant appearing w/ associated mediastinal adenopathy.  Cerebral angio / IR - R M1 occlusion s/p TICI2c revascularization   Post IR CT - no ICH, contrast stain R basal ganglia  MRI  R MCA multifocal infarct w/ mild petechial hemorrhage. Small L cerebellar infarct. Atrophy,  CT Head repeat 01/31/20 - Expected evolution of right MCA infarct. Stable focal subarachnoid hemorrhage near the right MCA bifurcation.   CT head 10/11 stable w/ expected evolution infarct w/ slight increase in edema. Stable R SAH.   2D Echo EF 55 to 60%, large pleural effusion on the  left  LDL 62  HgbA1c 6.5  VTE prophylaxis - Lovenox 40 mg sq daily   No antithrombotic prior to admission, ASA 81 mg daily started 01/31/20  Therapy recommendations:  SNF  Disposition: Pending  Acute Respiratory Failure  Secondary to stroke  Intubated for IR   CCM on board  Family discussed with palliative care and CCM, adamant about trach and PEG  S/p tracheostomy 10/20  Failed weaning   Consider LTACH (needs 21d of vent to qualify, today D#[id number])  CCM will follow intermittently for trach  CCM signed off 10/22 - may need to consult hospitalist   Fever and leukocytosis  TMax 100.8->99.9->99.2 ax  Leukocytosis 14.2->11.5 (CBC pending)  Blood Cx from  101/11 +Staph hominis  CXR 02/07/20 Suspect mass in the medial left apex. Correlation with chest CT, ideally with intravenous contrast, to further evaluate this area may well be warranted. Small left pleural effusion with bibasilar .atelectasis.  Blood cultures 10/14 - 1/2 staph hominis - sensitive to Vancomycin   Sputum Cultures 10/14 - Stenotrophomonas Maltophilia - sensitive to Levofloxacin and trimeth / sulfa  On Vanc 10/14 >>  (plan 14d course)  Completed cefepime 10/14 >>10/20  Severe anemia  Acute blood loss anemia s/p IR groin bleeding. Sheath removed using quick clot and pressure held   Hb 13.2->9.2->8.2->6.5->PRBC...8.8->8.4->7.6->6.9->PRBC->8.4->8.8->8.9 (CBC pending)  S/p PRBC transfusion x 2  CT abd/pelvis no retroperitoneal hemorrhage  CBC monitoring   Adenocarcinoma of lung w/ malignant L pleural effusion  CTA neck - L superior sulcus mass w/ large pleural effusion malignant appearing w/ associated mediastinal adenopathy.  CT chest large left pleural effusion with near complete left lung collapse.  Left apical mass suspicious for bronchogenic carcinoma  CT abd/pelvis no distal metastasis disease  Lt chest tube placed 10/9 - Dr Thayer Jew - removed 10/21  Cytology of pleural fluid - Malignant  cells consistent with metastatic adenocarcinoma   Oncology consult - stage IV adenocarcinoma of the lung, prognosis poor, not a candidate for aggressive tx - signed off 10/15  Palliative care on board - family wants trach/PEG  Prognosis poor   Possible Seizure 10/11  Worsening R gaze forced deviation in setting of HTN, no shaking or jerking - 10/11 am   EEG continuous slowing  Repeat CT head 02/02/2020 slight increase in infarct now involving right caudate head as well but otherwise stable mass-effect on no midline shift  Hypotension/Hypertension  Home meds:  hctz 25, metoprolol 25  Current - Labetalol prn  Off phenylephepdirine   Stable now . BP goal normotensive   Diabetes type II Controlled  Home meds:  None listed  HgbA1c 6.5, goal <  7.0  CBGs  SSI  On levimir 10->12 bid  DB RN following    Dysphagia At risk malnutrition . Secondary to stroke . NPO . On TF  . Son and daughter agreed with PEG . Trauma consult for PEG, will see today   HIV  On HARRP   CD4 in 07/2019 242  CD4 02/03/2020 185  Other Stroke Risk Factors  Advanced age  Migraines   Other Active Problems  Depression / anxiety  Hx right breast cancer 200 s/p lumpectomy and tamoxifen, recurred 2010 w/ resection, XRT, on arimidex PTA  Meeting with CCM, Palliative Care and family 10/19 - family feels pt would want a tracheostomy and PEG tube and they want to maximize her time on this earth and give her a chance to recovery. (pt remains DNR)  Thrombocytosis - [facility] day # 18 Patient's neurological condition remains unchanged but her prognosis is quite poor given her poor neurological exam as well as failure to wean off ventilatory support despite tracheostomy and new diagnosis of stage IV lung cancer.  Family has agreed to DNR but wish to pursue aggressive care and rehabilitation with hopes patient will improve to be able to get treatment for her stage IV lung cancer.   Neurological status remains quite unchanged for last week or 2 and ongoing medical issues refractory respiratory failure and failure to wean seem more prominent hence I recommend transfer of service to critical care team and neurology will be available to advise and speak with family as necessary.  Discussed with Dr. Ander Slade critical care team who agrees for him to take over as primary attending.This patient is critically ill and at significant risk of neurological worsening, death and care requires constant monitoring of vital signs, hemodynamics,respiratory and cardiac monitoring, extensive review of multiple databases, frequent neurological assessment, discussion with family, other specialists and medical decision making of high complexity.I have made any additions or clarifications directly to the above note.This critical care time does not reflect procedure time, or teaching time or supervisory time of PA/NP/Med Resident etc but could involve care discussion time.  I spent 30 minutes of neurocritical care time  in the care of  this patient.    Antony Contras, MD  To contact Stroke Continuity provider, please refer to [email or web address] After hours, contact General Neurology

## 2020-02-16 NOTE — Consult Note (Signed)
WOC Nurse Consult Note: Patient receiving care in [place] Reason for Consult: wound on right perineum due to purwick. Wound type: MASD partial thickness  Pressure Injury POA: NA Drainage (amount, consistency, odor) none Periwound: intact Dressing procedure/placement/frequency:  Clean perineal area with no rinse cleaner Kellie Simmering # [phone]) in clean supply. Pat dry then apply Critic-Aid Barrier Moisture cream Kellie Simmering # [phone]) Purple tube in clean supply. Clean PRN and with each Purwick change.  Change purwick qshift and PRN soiling.   Monitor the wound area(s) for worsening of condition such as: Signs/symptoms of infection, increase in size, development of or worsening of odor, development of pain, or increased pain at the affected locations.   Notify the medical team if any of these develop.  Thank you for the consult. [facility] nurse will not follow at this time.   Please re-consult the [facility] team if needed.  Cathlean Marseilles Tamala Julian, MSN, RN, [place], Lysle Pearl, [facility] Wound Treatment Associate Pager [phone]

## 2020-02-16 NOTE — Consult Note (Signed)
Consult Note  Amber Lopez 1940-08-14  [id number].    Requesting MD: Leonie Man Chief Complaint/Reason for Consult: PEG placement  HPI:  Patient is a 79 year old female who was admitted 10/7 with acute onset left hemiplegia, left facial droop, right eye deviation and aphasia. EMS was called and code stroke called prior to arrival. TPA given and IR consulted for revascularization. Workup showed acute embolic CVA R MCA and L cerebellum. On admission, possible left lung mass noted and patient underwent thora 10/9. Cytology from this showed metastatic adenocarcinoma of the lung. On 10/15 oncology wrote that patient was likely not a candidate for any sort of treatment for lung CA. Palliative has been following and plans another meeting for 10/27, but as of now they have been continuing to pursue aggressive care for patient. Hospitalization also complicated by respiratory failure s/p trach 10/20. Patient also bacteremic and on antibiotics for this.   PMH otherwise significant for HIV/AIDS, Hx of breast cancer, HTN, HLD, T2DM, Depression, Arthritis. Patient appears to have had an open cholecystectomy. Allergic to sulfa containing drugs.   ROS: Review of Systems  Unable to perform ROS: Critical illness    Family History  Problem Relation Age of Onset  . Breast cancer Sister 21    Past Medical History:  Diagnosis Date  . Arthritis   . Depression   . Diabetes mellitus without complication ([place])   . Eating disorder   . Hyperlipidemia   . Hypertension   . Migraines     Past Surgical History:  Procedure Laterality Date  . BREAST BIOPSY    . BREAST LUMPECTOMY Right    Years ago / Pt's daughter thinks it was cancer  . IR CT HEAD LTD  01/29/2020  . IR PERCUTANEOUS ART THROMBECTOMY/INFUSION INTRACRANIAL INC DIAG ANGIO  01/29/2020  . RADIOLOGY WITH ANESTHESIA N/A 01/29/2020   Procedure: IR WITH ANESTHESIA - CODE STROKE;  Surgeon: Radiologist, Medication, MD;  Location: [place];  Service:  Radiology;  Laterality: N/A;    Social History:  reports that she has never smoked. She has never used smokeless tobacco. She reports that she does not drink alcohol and does not use drugs.  Allergies:  Allergies  Allergen Reactions  . Sulfa Antibiotics Swelling and Rash    Medications Prior to Admission  Medication Sig Dispense Refill  . amoxicillin-clavulanate (AUGMENTIN) 875-125 MG tablet Take 1 tablet by mouth 2 (two) times daily. 20 tablet 0  . anastrozole (ARIMIDEX) 1 MG tablet Take 1 mg by mouth daily.    [name] Kitchen BIKTARVY 50-200-25 MG TABS tablet TAKE 1 TABLET BY MOUTH DAILY 30 tablet 0  . hydrochlorothiazide (HYDRODIURIL) 25 MG tablet Take 25 mg by mouth daily.    [name] Kitchen MELATONIN PO Take 6 mg by mouth daily as needed (For sleep).     . metoprolol succinate (TOPROL-XL) 25 MG 24 hr tablet Take 1 tablet (25 mg total) by mouth daily. 90 tablet 0  . potassium chloride SA (KLOR-CON) 20 MEQ tablet TAKE 1 TABLET(20 MEQ) BY MOUTH DAILY 90 tablet 1  . hydrochlorothiazide (HYDRODIURIL) 25 MG tablet TAKE 1 TABLET(25 MG) BY MOUTH DAILY (Patient not taking: Reported on 01/29/2020) 90 tablet 1    Blood pressure (!) 134/51, pulse 96, temperature 99 F (37.2 C), temperature source Axillary, resp. rate (!) 30, height 4\' 10"  (1.473 m), weight 54.4 kg, SpO2 100 %. Physical Exam:  General: WD, chronically ill appearing female who is laying in bed in NAD HEENT: head is  normocephalic, atraumatic.  Sclera are noninjected.  PERRL.  Ears and nose without any masses or lesions.  Mouth is pink and moist Neck: trach present and clean  Heart: regular, rate, and rhythm.  Normal s1,s2. No obvious murmurs, gallops, or rubs noted.  Palpable radial and pedal pulses bilaterally Lungs: CTAB, no wheezes, rhonchi, or rales noted. On the vent Abd: soft, NT, ND, +BS, RUQ surgical scar MS: all 4 extremities are symmetrical with no cyanosis, clubbing, or edema. Skin: warm and dry with no masses, lesions, or rashes Neuro:  nodding to some questions, eyes open spontaneously, not following commands   Results for orders placed or performed during the hospital encounter of 01/29/20 (from the past 48 hour(s))  Glucose, capillary     Status: Abnormal   Collection Time: 02/14/20  3:23 PM  Result Value Ref Range   Glucose-Capillary 148 (H) 70 - 99 mg/dL    Comment: Glucose reference range applies only to samples taken after fasting for at least 8 hours.  Glucose, capillary     Status: Abnormal   Collection Time: 02/14/20  7:28 PM  Result Value Ref Range   Glucose-Capillary 183 (H) 70 - 99 mg/dL    Comment: Glucose reference range applies only to samples taken after fasting for at least 8 hours.  Glucose, capillary     Status: Abnormal   Collection Time: 02/14/20 11:18 PM  Result Value Ref Range   Glucose-Capillary 220 (H) 70 - 99 mg/dL    Comment: Glucose reference range applies only to samples taken after fasting for at least 8 hours.  Glucose, capillary     Status: Abnormal   Collection Time: 02/15/20  3:12 AM  Result Value Ref Range   Glucose-Capillary 135 (H) 70 - 99 mg/dL    Comment: Glucose reference range applies only to samples taken after fasting for at least 8 hours.  Glucose, capillary     Status: Abnormal   Collection Time: 02/15/20  8:47 AM  Result Value Ref Range   Glucose-Capillary 158 (H) 70 - 99 mg/dL    Comment: Glucose reference range applies only to samples taken after fasting for at least 8 hours.  Glucose, capillary     Status: Abnormal   Collection Time: 02/15/20 11:25 AM  Result Value Ref Range   Glucose-Capillary 113 (H) 70 - 99 mg/dL    Comment: Glucose reference range applies only to samples taken after fasting for at least 8 hours.  CBC     Status: Abnormal   Collection Time: 02/15/20  4:30 PM  Result Value Ref Range   WBC 9.6 4.0 - 10.5 K/uL   RBC 3.11 (L) 3.87 - 5.11 MIL/uL   Hemoglobin 9.2 (L) 12.0 - 15.0 g/dL   HCT 29.5 (L) 36 - 46 %   MCV 94.9 80.0 - 100.0 fL   MCH  29.6 26.0 - 34.0 pg   MCHC 31.2 30.0 - 36.0 g/dL   RDW 14.9 11.5 - 15.5 %   Platelets 640 (H) 150 - 400 K/uL   nRBC 0.0 0.0 - 0.2 %    Comment: Performed at [facility] [address], [place], [place] [address]  Basic metabolic panel     Status: Abnormal   Collection Time: 02/15/20  4:30 PM  Result Value Ref Range   Sodium 135 135 - 145 mmol/L   Potassium 4.7 3.5 - 5.1 mmol/L   Chloride 101 98 - 111 mmol/L   CO2 25 22 - 32 mmol/L  Glucose, Bld 182 (H) 70 - 99 mg/dL    Comment: Glucose reference range applies only to samples taken after fasting for at least 8 hours.   BUN 19 8 - 23 mg/dL   Creatinine, Ser 0.86 0.44 - 1.00 mg/dL   Calcium 8.3 (L) 8.9 - 10.3 mg/dL   GFR, Estimated >60 >60 mL/min    Comment: (NOTE) Calculated using the CKD-EPI Creatinine Equation (2021)    Anion gap 9 5 - 15    Comment: Performed at [facility] [address] [place], [place] [address]  Glucose, capillary     Status: Abnormal   Collection Time: 02/15/20  4:32 PM  Result Value Ref Range   Glucose-Capillary 170 (H) 70 - 99 mg/dL    Comment: Glucose reference range applies only to samples taken after fasting for at least 8 hours.  Glucose, capillary     Status: Abnormal   Collection Time: 02/15/20  7:35 PM  Result Value Ref Range   Glucose-Capillary 150 (H) 70 - 99 mg/dL    Comment: Glucose reference range applies only to samples taken after fasting for at least 8 hours.  Glucose, capillary     Status: Abnormal   Collection Time: 02/15/20 11:26 PM  Result Value Ref Range   Glucose-Capillary 150 (H) 70 - 99 mg/dL    Comment: Glucose reference range applies only to samples taken after fasting for at least 8 hours.  Glucose, capillary     Status: Abnormal   Collection Time: 02/16/20  3:17 AM  Result Value Ref Range   Glucose-Capillary 147 (H) 70 - 99 mg/dL    Comment: Glucose reference range applies only to samples taken after fasting for at least 8 hours.  CBC     Status:  Abnormal   Collection Time: 02/16/20  6:11 AM  Result Value Ref Range   WBC 8.7 4.0 - 10.5 K/uL   RBC 2.99 (L) 3.87 - 5.11 MIL/uL   Hemoglobin 9.0 (L) 12.0 - 15.0 g/dL   HCT 28.8 (L) 36 - 46 %   MCV 96.3 80.0 - 100.0 fL   MCH 30.1 26.0 - 34.0 pg   MCHC 31.3 30.0 - 36.0 g/dL   RDW 15.0 11.5 - 15.5 %   Platelets 626 (H) 150 - 400 K/uL   nRBC 0.0 0.0 - 0.2 %    Comment: Performed at [facility] [address], [place], [place] [address]  Basic metabolic panel     Status: Abnormal   Collection Time: 02/16/20  6:11 AM  Result Value Ref Range   Sodium 135 135 - 145 mmol/L   Potassium 4.5 3.5 - 5.1 mmol/L   Chloride 102 98 - 111 mmol/L   CO2 25 22 - 32 mmol/L   Glucose, Bld 194 (H) 70 - 99 mg/dL    Comment: Glucose reference range applies only to samples taken after fasting for at least 8 hours.   BUN 21 8 - 23 mg/dL   Creatinine, Ser 0.90 0.44 - 1.00 mg/dL   Calcium 8.5 (L) 8.9 - 10.3 mg/dL   GFR, Estimated >60 >60 mL/min    Comment: (NOTE) Calculated using the CKD-EPI Creatinine Equation (2021)    Anion gap 8 5 - 15    Comment: Performed at [facility] [address], [place], [place] [address]  Glucose, capillary     Status: Abnormal   Collection Time: 02/16/20  7:48 AM  Result Value Ref Range   Glucose-Capillary  185 (H) 70 - 99 mg/dL    Comment: Glucose reference range applies only to samples taken after fasting for at least 8 hours.  Glucose, capillary     Status: Abnormal   Collection Time: 02/16/20 11:37 AM  Result Value Ref Range   Glucose-Capillary 103 (H) 70 - 99 mg/dL    Comment: Glucose reference range applies only to samples taken after fasting for at least 8 hours.   No results found.    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 Dysphagia secondary to above - palliative care planning to have another discussion with daughter and son regarding [place] - overall prognosis for recovery seems poor - daughter understands that placing PEG will not cure any of patient's other medical problems, it is just to be able to provide enteral nutrition - we will follow up after palliative care discussions and plan accordingly   FEN: TF@ 50 cc/h VTE: lovenox ID: vancomycin for bacteremia, HART therapy  Norm Parcel, [facility] Surgery 02/16/2020, 2:14 PM Please see Amion for pager number during day hours 7:00am-4:30pm

## 2020-02-16 NOTE — TOC Initial Note (Signed)
Transition of Care [facility]) - Initial/Assessment Note    Patient Details  Name: Amber Lopez MRN: [id number] Date of Birth: 02/07/1941  Transition of Care [facility]) CM/SW Contact:    Ella Bodo, RN Phone Number: 02/16/2020, 5:03 PM  Clinical Narrative:79 y.o. female with a PMHx of depression, HLD, HIV, HTN and DM presenting with acute onset of left hemiplegia, left facial droop, right eye deviation and aphasia. Pt received IV tPA and underwent R MCA revacularization on 01/29/2020. Pt also found to have large left pleural effusion with near complete L lung collapse and L apical mass. Chest tube placed on 01/31/2020.  Prior to admission, patient required assistance with activities of daily living; she lives at home with adult children.  Patient s/p tracheostomy on 02/11/2020; she currently remains on ventilator after failing weaning attempts.  Palliative care has been working with family regarding goals of care and treatment options.  PEG tube has been scheduled for tomorrow.  LTAC consult has been placed; I spoke with son JR by phone today-he states that family has been interested in having patient transferred to another hospital.  We discussed [facility], and he was given the names of [place] long-term acute care hospitals.  He is agreeable to LTAC referral at this time; he plans to discuss potential LTAC transfer with his sister, and follow-up with me.  Referrals made to both area [place] to check eligibility and insurance benefits.  Will provide updates as they are available.    Expected Discharge Plan: Long Term Acute Care (LTAC) Barriers to Discharge: Continued Medical Work up   Patient Goals and CMS Choice   CMS Medicare.gov Compare Post Acute Care list provided to:: Patient Represenative (must comment) (adult children)    Expected Discharge Plan and Services Expected Discharge Plan: Long Term Acute Care (LTAC)   Discharge Planning Services: CM Consult Post Acute Care  Choice: Long Term Acute Care (LTAC) Living arrangements for the past 2 months: Single Family Home                                      Prior Living Arrangements/Services Living arrangements for the past 2 months: Single Family Home Lives with:: Adult Children          Need for Family Participation in Patient Care: Yes (Comment) Care giver support system in place?: Yes (comment) Current home services: DME    Activities of Daily Living Home Assistive Devices/Equipment: None ADL Screening (condition at time of admission) Patient's cognitive ability adequate to safely complete daily activities?: Yes Is the patient deaf or have difficulty hearing?: No Does the patient have difficulty seeing, even when wearing glasses/contacts?: No Does the patient have difficulty concentrating, remembering, or making decisions?: No Patient able to express need for assistance with ADLs?: Yes Does the patient have difficulty dressing or bathing?: No Independently performs ADLs?: Yes (appropriate for developmental age) Does the patient have difficulty walking or climbing stairs?: No Weakness of Legs: None Weakness of Arms/Hands: None                   Emotional Assessment   Attitude/Demeanor/Rapport: Intubated (Following Commands or Not Following Commands) Affect (typically observed): Unable to Assess        Admission diagnosis:  Stroke [facility]) [I63.9] Stroke (cerebrum) [facility]) [I63.9] Acute right MCA stroke ([place]) [I63.511] Cerebrovascular accident (CVA), unspecified mechanism ([place]) [I63.9] Middle cerebral artery embolism, right [I66.01] Patient  Active Problem List   Diagnosis Date Noted  . Tracheostomy in place [facility]   . Ventilator dependent ([place])   . Endotracheal tube present   . Pleural effusion on left   . Acute respiratory failure with hypoxia ([place])   . Pressure injury of skin 02/07/2020  . Palliative care by specialist   . Infiltrate noted on imaging study   .  Adenocarcinoma ([place])   . Malnutrition of moderate degree 02/03/2020  . Adenocarcinoma of left lung ([place])   . Acute right MCA stroke ([place]) 01/29/2020  . Middle cerebral artery embolism, right 01/29/2020  . DNR (do not resuscitate) discussion 08/12/2019  . HIV disease ([place]) 03/03/2019  . History of breast cancer 03/01/2019  . Dementia with behavioral disturbance ([place]) 03/01/2019  . Arthritis 03/01/2019  . Essential hypertension 03/01/2019   PCP:  Billie Ruddy, MD Pharmacy:   [facility] [place], [place] [address] Phone: [phone] Fax: [phone]     Social Determinants of Health (SDOH) Interventions    Readmission Risk Interventions No flowsheet data found.  Reinaldo Raddle, RN, BSN  Trauma/Neuro ICU Case Manager [phone]

## 2020-02-16 NOTE — Progress Notes (Signed)
SLP Cancellation Note  Patient Details Name: LEZA APSEY MRN: [id number] DOB: Mar 21, 1941   Cancelled treatment:       Reason Eval/Treat Not Completed: Medical issues which prohibited therapy. Pt with trach on vent, but not following commands. Per review of chart, mentation is not appropriate for inline PMV trials at this time. Will continue to follow for readiness.    Osie Bond., M.A. [place] Acute Rehabilitation Services Pager [phone] Office [phone]  02/16/2020, 7:11 AM

## 2020-02-16 NOTE — Progress Notes (Signed)
Called [facility] transfer line -Waiting to hear back

## 2020-02-16 NOTE — Progress Notes (Signed)
Physical Therapy Treatment Patient Details Name: Amber Lopez MRN: [id number] DOB: 10/05/1940 Today's Date: 02/16/2020    History of Present Illness 79 y.o. female with a PMHx of depression, HLD, HIV, HTN and DM presenting with acute onset of left hemiplegia, left facial droop, right eye deviation and aphasia. Pt received IV tPA and underwent R MCA revacularization on 01/29/2020. Pt also found to have large left pleural effusion with near complete L lung collapse and L apical mass. Chest tube placed on 01/31/2020. s/p trach on 02/11/20.     PT Comments    Pt alert, and responding occasionally when questions delivered in R visual field.  Pt not tracking left of midline for me.  No spontaneous movement on the L side, movement to command noted on the right side.  Emphasis on rolling, transition to sitting, sitting balance at EOB, work on cervical ROM, sit to stand to adjust padding before return to supine.    Follow Up Recommendations  SNF     Equipment Recommendations  Wheelchair (measurements PT);Wheelchair cushion (measurements PT);Hospital bed;3in1 (PT)    Recommendations for Other Services       Precautions / Restrictions Precautions Precautions: Fall    Mobility  Bed Mobility Overal bed mobility: Needs Assistance Bed Mobility: Supine to Sit;Sit to Supine;Rolling Rolling: Max assist;Total assist (Total R) Sidelying to sit:  (total R) Supine to sit: Total assist;+2 for physical assistance Sit to supine: Max assist;+2 for physical assistance   General bed mobility comments: pt needed truncal assist through out, but able to assist more to return to bed after warmed up.  Transfers Overall transfer level: Needs assistance   Transfers: Sit to/from Stand Sit to Stand: Max assist;+2 safety/equipment         General transfer comment: assist to come forward and boost.  Ambulation/Gait             General Gait Details: not able   Stairs             Wheelchair  Mobility    Modified Rankin (Stroke Patients Only) Modified Rankin (Stroke Patients Only) Pre-Morbid Rankin Score: Slight disability Modified Rankin: Severe disability     Balance Overall balance assessment: Needs assistance Sitting-balance support: Feet unsupported;Single extremity supported Sitting balance-Leahy Scale: Poor Sitting balance - Comments: need up to moderate assist, episodes of pushing L Postural control: Right lateral lean;Posterior lean Standing balance support: Single extremity supported;Bilateral upper extremity supported;During functional activity Standing balance-Leahy Scale: Poor                              Cognition Arousal/Alertness: Lethargic;Awake/alert Behavior During Therapy: Flat affect Overall Cognitive Status: Difficult to assess Area of Impairment: Following commands;Attention;Problem solving                   Current Attention Level: Sustained   Following Commands: Follows one step commands with increased time;Follows one step commands inconsistently     Problem Solving: Requires verbal cues;Difficulty sequencing;Requires tactile cues        Exercises      General Comments General comments (skin integrity, edema, etc.): vss overall on ventilator with 30% FiO2      Pertinent Vitals/Pain Pain Assessment: Faces Faces Pain Scale: No hurt Pain Intervention(s): Monitored during session    Home Living                      Prior Function  PT Goals (current goals can now be found in the care plan section) Acute Rehab PT Goals Patient Stated Goal: Pt unable to state  PT Goal Formulation: With patient/family Time For Goal Achievement: 02/29/20 Potential to Achieve Goals: Fair Progress towards PT goals: Progressing toward goals    Frequency    Min 3X/week      PT Plan Current plan remains appropriate    Co-evaluation              AM-PAC PT "6 Clicks" Mobility   Outcome  Measure  Help needed turning from your back to your side while in a flat bed without using bedrails?: Total Help needed moving from lying on your back to sitting on the side of a flat bed without using bedrails?: Total Help needed moving to and from a bed to a chair (including a wheelchair)?: Total Help needed standing up from a chair using your arms (e.g., wheelchair or bedside chair)?: Total Help needed to walk in hospital room?: Total Help needed climbing 3-5 steps with a railing? : Total 6 Click Score: 6    End of Session Equipment Utilized During Treatment: Oxygen Activity Tolerance: Patient limited by fatigue Patient left: in bed;with call bell/phone within reach Nurse Communication: Mobility status PT Visit Diagnosis: Other abnormalities of gait and mobility (R26.89);Muscle weakness (generalized) (M62.81);Hemiplegia and hemiparesis;Other symptoms and signs involving the nervous system (R29.898) Hemiplegia - Right/Left: Left Hemiplegia - dominant/non-dominant: Non-dominant Hemiplegia - caused by: Cerebral infarction     Time: 1450-1515 PT Time Calculation (min) (ACUTE ONLY): 25 min  Charges:  $Therapeutic Activity: 23-37 mins                     02/16/2020  Ginger Carne., PT Acute Rehabilitation Services [phone]  (pager) [phone]  (office)   Tessie Fass Rea Kalama 02/16/2020, 3:36 PM

## 2020-02-17 DIAGNOSIS — C3492 Malignant neoplasm of unspecified part of left bronchus or lung: Secondary | ICD-10-CM | POA: Diagnosis not present

## 2020-02-17 DIAGNOSIS — I63511 Cerebral infarction due to unspecified occlusion or stenosis of right middle cerebral artery: Secondary | ICD-10-CM | POA: Diagnosis not present

## 2020-02-17 DIAGNOSIS — J9601 Acute respiratory failure with hypoxia: Secondary | ICD-10-CM | POA: Diagnosis not present

## 2020-02-17 DIAGNOSIS — C801 Malignant (primary) neoplasm, unspecified: Secondary | ICD-10-CM | POA: Diagnosis not present

## 2020-02-17 LAB — GLUCOSE, CAPILLARY
Glucose-Capillary: 160 mg/dL — ABNORMAL HIGH (ref 70–99)
Glucose-Capillary: 180 mg/dL — ABNORMAL HIGH (ref 70–99)
Glucose-Capillary: 107 mg/dL — ABNORMAL HIGH (ref 70–99)
Glucose-Capillary: 136 mg/dL — ABNORMAL HIGH (ref 70–99)
Glucose-Capillary: 161 mg/dL — ABNORMAL HIGH (ref 70–99)
Glucose-Capillary: 133 mg/dL — ABNORMAL HIGH (ref 70–99)

## 2020-02-17 LAB — BASIC METABOLIC PANEL
Anion gap: 8 (ref 5–15)
BUN: 16 mg/dL (ref 8–23)
CO2: 26 mmol/L (ref 22–32)
Calcium: 8.5 mg/dL — ABNORMAL LOW (ref 8.9–10.3)
Chloride: 102 mmol/L (ref 98–111)
Creatinine, Ser: 0.76 mg/dL (ref 0.44–1.00)
GFR, Estimated: >60 mL/min (ref >60)
Glucose, Bld: 107 mg/dL — ABNORMAL HIGH (ref 70–99)
Potassium: 4.4 mmol/L (ref 3.5–5.1)
Sodium: 136 mmol/L (ref 135–145)

## 2020-02-17 LAB — CBC
HCT: 29.2 % — ABNORMAL LOW (ref 36.0–46.0)
Hemoglobin: 9.2 g/dL — ABNORMAL LOW (ref 12.0–15.0)
MCH: 29.7 pg (ref 26.0–34.0)
MCHC: 31.5 g/dL (ref 30.0–36.0)
MCV: 94.2 fL (ref 80.0–100.0)
Platelets: 618 10*3/uL — ABNORMAL HIGH (ref 150–400)
RBC: 3.1 MIL/uL — ABNORMAL LOW (ref 3.87–5.11)
RDW: 15.1 % (ref 11.5–15.5)
WBC: 8.6 10*3/uL (ref 4.0–10.5)
nRBC: 0 % (ref 0.0–0.2)

## 2020-02-17 MED ORDER — ALTEPLASE 2 MG IJ SOLR
2.0000 mg | Freq: Once | INTRAMUSCULAR | Status: AC
  Administered 2020-02-17: 2 mg
  Filled 2020-02-17: qty 2

## 2020-02-17 NOTE — TOC Progression Note (Signed)
Transition of Care [facility]) - Progression Note    Patient Details  Name: Amber Lopez MRN: [id number] Date of Birth: 10-Oct-1940  Transition of Care [facility]) CM/SW Contact  Ella Bodo, RN Phone Number: 02/17/2020, 3:06 PM  Clinical Narrative:   Spoke with patient's daughter, Chantel, by phone to discuss possible LTAC transfer.  Currently, [facility] is the only [facility] available in this area, as [facility] is not offering a bed due to high cost HIV meds.  Daughter states she will not agree to discharge to [facility] due to bad reviews.  She states she would consider facilities out of the area if necessary.  She continues to ask transfer to [facility], or [facility]; explained to daughter that patient would have to need a specific service that [facility] does not offer in order to transfer to another acute facility.  MD feels that patient is better suited for long-term acute care facility.  Daughter stated that there will be another goals of care meeting tomorrow at 2 PM, and she will not make any decisions until after this meeting.  We will continue to follow.    Expected Discharge Plan: Long Term Acute Care (LTAC) Barriers to Discharge: Continued Medical Work up  Expected Discharge Plan and Services Expected Discharge Plan: Long Term Acute Care (LTAC)   Discharge Planning Services: CM Consult Post Acute Care Choice: Long Term Acute Care (LTAC) Living arrangements for the past 2 months: Single Family Home                                       Social Determinants of Health (SDOH) Interventions    Readmission Risk Interventions No flowsheet data found.  Reinaldo Raddle, RN, BSN  Trauma/Neuro ICU Case Manager [phone]

## 2020-02-17 NOTE — Progress Notes (Signed)
Called to discuss PEG with patient's daughter who declined to discuss until meeting scheduled with palliative care 10/27 at 1400. Will follow up.   Jesusita Oka, MD General and [place] Surgery

## 2020-02-17 NOTE — Progress Notes (Signed)
NAME:  Amber Lopez, MRN:  [id number], DOB:  1940/09/27, LOS: 58 ADMISSION DATE:  01/29/2020, CONSULTATION DATE:  10/7 REFERRING MD:  Estanislado Pandy, CHIEF COMPLAINT:  Left Sided Weakness   Brief History   79 y/o F, with HIV, admitted 10/7 with LVO of R M1 s/p tPA, neuro IR revascularization.  Returned to ICU post procedure on mechanical ventilation. Additional finding of left superior sulcus mass and complex effusion.   Past Medical History  HIV/AIDS, HTN, HLD, DM, Depression , Arthritis, Breast Cancer   Significant Hospital Events   10/07 Admit with L hemiplegia, facial droop, aphasia with right eye deviation 10/11 Episode of worsened R eye deviation with tachycardia and HTN, concern for sz   Consults:  Neurology, IR  Oncology 10/13 Palliative care 10/13  Procedures:  ETT 10/7 >> 10/20 L Radial ALine 10/7 >>  out R Femoral Sheath 10/7 >> out 10/20 tracheostomy >   Significant Diagnostic Tests:  10/7 CT Head Code Stroke >> no acute finding, generalized atrophy  10/7 CTA Head/Neck >> emergent LVO at the right M1 segment, no visible embolic source, atherosclerosis without flow limiting stenosis or ulceration of major vessels, left superior sulcus mass with large complex pleural effusion, associated mediastinal adenopathy  10/7 CT ABD w/contrast >> Negative for hematoma 10/7 CT Chest w/contrast >> Persistent large left pleural effusion with near complete left lung collapse. Left apical mass measuring 3.5 x 3.3 cm suspicious for bronchogenic carcinoma 10/11 CT head>>no acute findings 10/11 EEG>>Continuous slow, generalized and maximal right frontotemporal region  Micro Data:  COVID 10/7 >> negative  Influenza A/B 10/7 >> negative Pleural fluid culture>>no growth three days 10/9 BCx2>> 1/2 with gram positives 10/7 MRSA screen>>negative 10/11 BC x 1 (from left PICC) > staph hominis 1/2 10/14 trach asp >> few WBC, rare G+ cocci>> few stenotrophomonas maltophilia S to levaquin and  bactrim  10/14 BC >> negative  Antimicrobials:  10/7 cefazolin 10/8 dolutegravir/ emtricitabine/ tenofovir > 10/14 vanc > 10/14 cefepime > 10/20  Interim history/subjective:  No acute event overnight. Failed vent wean after 1 hour.  Still not able to wean  Objective   Blood pressure (!) 141/61, pulse 88, temperature 98.8 F (37.1 C), temperature source Axillary, resp. rate (!) 25, height 4\' 10"  (1.473 m), weight 54.4 kg, SpO2 100 %.    Vent Mode: PRVC FiO2 (%):  [30 %] 30 % Set Rate:  [16 bmp] 16 bmp Vt Set:  [320 mL] 320 mL PEEP:  [5 cmH20] 5 cmH20 Plateau Pressure:  [21 cmH20-23 cmH20] 23 cmH20   Intake/Output Summary (Last 24 hours) at 02/17/2020 0915 Last data filed at 02/17/2020 9798 Gross per 24 hour  Intake 1147.39 ml  Output 1125 ml  Net 22.39 ml   Filed Weights   01/29/20 0700 02/13/20 0700  Weight: 53.7 kg 54.4 kg   Examination: Frail elderly lady, on ventilator Clear breath sounds anteriorly S1-S2 appreciated Bowel sounds appreciated Extremities shows no clubbing or edema Neurologically-not following commands but able to answer questions with one syllable answers   Resolved Hospital Problem list     Assessment & Plan:  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 -Secondary stroke prevention in place -Management of hypertension  Acute respiratory failure with hypoxemia, not weaning Inability to clear secretions Pleural effusion S/p tracheostomy on 10/20 -Weaning as tolerated -Transitional care team following for LTAC placement -We will need 21 days of vent management to qualify for LTAC -Currently day 17 of vent  Adenocarcinoma of  the lung with malignant pleural effusion -Was seen by oncology -Poor performance status for chemotherapy with metastatic disease  Staph hominis in blood culture -today is day 13/14   Hypertension -Continue to monitor  HIV/AIDS -Continue tight Vaquez and Descovy -Atovaquone  Type 2 diabetes and  hyperglycemia -SSI -Levemir increased to 12 units twice daily  Nutritional needs -Tube feeds via cortrac  Normocytic anemia Monitor for bleeding Transfuse per protocol  Family had requested for patient to be transferred to another facility I did make a call to [facility]-no beds available Options with LTAC being looked in to    Dr. Lake Bells had had discussion with family regarding prognosis Prognosis: poor: likelihood of survival to 6 months is < 5%.  I feel she should be home on hospice.  The family did not agree and insisted on aggressive measures, hence the tracheostomy placed today. Would pursue LTACH placement as their goal is for her to rehab and make it out to be evaluated for lung cancer treatment  Best practice:  Diet: tube feeding  Pain/Anxiety/Delirium protocol (if indicated): as needed VAP protocol (if indicated): yes DVT prophylaxis: lovenox GI prophylaxis: Pantoprazole for stress ulcer prophylaxis Glucose control: SSI Mobility: bed rest Code Status: full Family Communication: Discussed with daughter at bedside 10/26 Disposition: LTACH  Labs   CBC: Recent Labs  Lab 02/11/20 1402 02/12/20 0520 02/14/20 0413 02/15/20 1630 02/16/20 0611  WBC 13.6* 14.2* 11.5* 9.6 8.7  HGB 8.4* 8.8* 8.9* 9.2* 9.0*  HCT 26.1* 27.7* 27.8* 29.5* 28.8*  MCV 91.6 91.1 93.3 94.9 96.3  PLT 421* 509* 529* 640* 626*    Basic Metabolic Panel: Recent Labs  Lab 02/12/20 0520 02/13/20 0638 02/14/20 0413 02/15/20 1630 02/16/20 0611  NA 135 135 135 135 135  K 4.0 4.3 4.0 4.7 4.5  CL 104 104 104 101 102  CO2 23 23 25 25 25   GLUCOSE 174* 229* 150* 182* 194*  BUN 20 18 18 19 21   CREATININE 0.83 0.95 0.83 0.86 0.90  CALCIUM 7.8* 7.9* 8.2* 8.3* 8.5*  MG 1.8  --   --   --   --   PHOS 2.2*  --   --   --   --    GFR: Estimated Creatinine Clearance: 37 mL/min (by C-G formula based on SCr of 0.9 mg/dL). Recent Labs  Lab 02/12/20 0520 02/14/20 0413 02/15/20 1630 02/16/20 0611  WBC  14.2* 11.5* 9.6 8.7    Liver Function Tests: Recent Labs  Lab 02/13/20 0638  AST 43*  ALT 62*  ALKPHOS 76  BILITOT 0.5  PROT 6.4*  ALBUMIN 1.4*   No results for input(s): LIPASE, AMYLASE in the last 168 hours. No results for input(s): AMMONIA in the last 168 hours.  ABG    Component Value Date/Time   PHART 7.419 01/30/2020 1042   PCO2ART 26.5 (L) 01/30/2020 1042   PO2ART 143 (H) 01/30/2020 1042   HCO3 17.3 (L) 01/30/2020 1042   TCO2 18 (L) 01/30/2020 1042   ACIDBASEDEF 7.0 (H) 01/30/2020 1042   O2SAT 99.0 01/30/2020 1042     Coagulation Profile: Recent Labs  Lab 02/11/20 0433  INR 1.3*    Cardiac Enzymes: No results for input(s): CKTOTAL, CKMB, CKMBINDEX, TROPONINI in the last 168 hours.  HbA1C: Hgb A1c MFr Bld  Date/Time Value Ref Range Status  01/29/2020 04:22 PM 6.5 (H) 4.8 - 5.6 % Final    Comment:    (NOTE) Pre diabetes:          5.7%-6.4%  Diabetes:              >6.4%  Glycemic control for   <7.0% adults with diabetes     CBG: Recent Labs  Lab 02/16/20 1608 02/16/20 1929 02/16/20 2320 02/17/20 0315 02/17/20 0745  GLUCAP 106* 161* 192* 160* 180*   The patient is critically ill with multiple organ systems failure and requires high complexity decision making for assessment and support, frequent evaluation and titration of therapies, application of advanced monitoring technologies and extensive interpretation of multiple databases. Critical Care Time devoted to patient care services described in this note independent of APP/resident time (if applicable)  is 32 minutes.   Sherrilyn Rist MD [facility] Personal pager: [phone] If unanswered, please page CCM On-call: [phone]

## 2020-02-17 NOTE — Progress Notes (Signed)
STROKE TEAM PROGRESS NOTE   INTERVAL HISTORY Patient remains on ventilatory support for respiratory failure on tracheostomy.  Neurological exam is unchanged with patient awake but not following any commands with dense left hemiplegia and purposeful movements on the right. .  Family continues to have unrealistic expectations that patient is going to improve with aggressive care and prolonged rehab and then undergo treatment for her stage IV lung cancer.  Vital signs stable.  Yet unable to wean off ventilatory support  Vitals:   02/17/20 1000 02/17/20 1100 02/17/20 1120 02/17/20 1200  BP: (!) 169/70 (!) 144/72 (!) 144/72   Pulse: 96 84 99   Resp: (!) 28 (!) 23 (!) 26   Temp:    98.4 F (36.9 C)  TempSrc:    Axillary  SpO2: 99% 100% 100%   Weight:      Height:       CBC:  Recent Labs  Lab 02/16/20 0611 02/17/20 1300  WBC 8.7 8.6  HGB 9.0* 9.2*  HCT 28.8* 29.2*  MCV 96.3 94.2  PLT 626* 846*   Basic Metabolic Panel:  Recent Labs  Lab 02/12/20 0520 02/13/20 0638 02/16/20 0611 02/17/20 1300  NA 135   < > 135 136  K 4.0   < > 4.5 4.4  CL 104   < > 102 102  CO2 23   < > 25 26  GLUCOSE 174*   < > 194* 107*  BUN 20   < > 21 16  CREATININE 0.83   < > 0.90 0.76  CALCIUM 7.8*   < > 8.5* 8.5*  MG 1.8  --   --   --   PHOS 2.2*  --   --   --    < > = values in this interval not displayed.    IMAGING past 24 hours No results found.  PHYSICAL EXAM:     Temp:  [98.4 F (36.9 C)-100.4 F (38 C)] 98.4 F (36.9 C) (10/26 1200) Pulse Rate:  [76-112] 99 (10/26 1120) Resp:  [16-35] 26 (10/26 1120) BP: (119-169)/(41-111) 144/72 (10/26 1120) SpO2:  [99 %-100 %] 100 % (10/26 1120) FiO2 (%):  [30 %] 30 % (10/26 1120)  General - NAD, s/p trach on ventilator  Ophthalmologic - fundi not visualized due to noncooperation. PERRL.  Cardiovascular - Regular rhythm, slightly tachycardic  Neuro - s/p trach on vent, eyes spontaneous open however did not follow simple commands. Right  gaze preference but will not cross midline.  Blinking to visual threat on the right but not on the left, able to track on the right visual field, PERRL. Corneal reflex present, gag and cough present. Breathing over the vent.    Left facial droop.  Tongue protrusion not cooperative. Spontaneous movement of RUE and localize to pain, will hold up right arm antigravity with help, withdraw RLE to pain 2+/5. Slight withdrawal LLE. LUE no spontaneous movement, or withdraw to pain. no babinski. Sensation, coordination and gait not tested.   ASSESSMENT/PLAN Amber Lopez is a 79 y.o. female with history of depression, HLD, HIV, HTN and DM presenting with left hemiplegia, left facial droop, right eye deviation and aphasia. Received IV tPA 01/29/2020 at 0816. -> IR 01/29/20  Stroke:  R MCA and small L cerebellar infarcts s/p tPA + IR R M1 occlusion w/ TICI2c revascularization, embolic secondary to unknown source possibly hypercoagulability from lung cancer  Code Stroke CT head No acute abnormality. Atrophy. ASPECTS 10.     CTA head &  neck LVO R M1. Atherosclerosis. L superior sulcus mass w/ large pleural effusion malignant appearing w/ associated mediastinal adenopathy.  Cerebral angio / IR - R M1 occlusion s/p TICI2c revascularization   Post IR CT - no ICH, contrast stain R basal ganglia  MRI  R MCA multifocal infarct w/ mild petechial hemorrhage. Small L cerebellar infarct. Atrophy,  CT Head repeat 01/31/20 - Expected evolution of right MCA infarct. Stable focal subarachnoid hemorrhage near the right MCA bifurcation.   CT head 10/11 stable w/ expected evolution infarct w/ slight increase in edema. Stable R SAH.   2D Echo EF 55 to 60%, large pleural effusion on the left  LDL 62  HgbA1c 6.5  VTE prophylaxis - Lovenox 40 mg sq daily   No antithrombotic prior to admission, ASA 81 mg daily started 01/31/20  Therapy recommendations:  SNF  Disposition: Pending  Acute Respiratory  Failure  Secondary to stroke  Intubated for IR   CCM on board  Family discussed with palliative care and CCM, adamant about trach and PEG  S/p tracheostomy 10/20  Failed weaning   Consider LTACH (needs 21d of vent to qualify, today D#19)  CCM will follow intermittently for trach  CCM signed off 10/22 - may need to consult hospitalist   Fever and leukocytosis  TMax 100.8->99.9->99.2 ax  Leukocytosis 14.2->11.5 (CBC pending)  Blood Cx from  101/11 +Staph hominis  CXR 02/07/20 Suspect mass in the medial left apex. Correlation with chest CT, ideally with intravenous contrast, to further evaluate this area may well be warranted. Small left pleural effusion with bibasilar .atelectasis.  Blood cultures 10/14 - 1/2 staph hominis - sensitive to Vancomycin   Sputum Cultures 10/14 - Stenotrophomonas Maltophilia - sensitive to Levofloxacin and trimeth / sulfa  On Vanc 10/14 >>  (plan 14d course)  Completed cefepime 10/14 >>10/20  Severe anemia  Acute blood loss anemia s/p IR groin bleeding. Sheath removed using quick clot and pressure held   Hb 13.2->9.2->8.2->6.5->PRBC...8.8->8.4->7.6->6.9->PRBC->8.4->8.8->8.9 (CBC pending)  S/p PRBC transfusion x 2  CT abd/pelvis no retroperitoneal hemorrhage  CBC monitoring   Adenocarcinoma of lung w/ malignant L pleural effusion  CTA neck - L superior sulcus mass w/ large pleural effusion malignant appearing w/ associated mediastinal adenopathy.  CT chest large left pleural effusion with near complete left lung collapse.  Left apical mass suspicious for bronchogenic carcinoma  CT abd/pelvis no distal metastasis disease  Lt chest tube placed 10/9 - Dr Thayer Jew - removed 10/21  Cytology of pleural fluid - Malignant cells consistent with metastatic adenocarcinoma   Oncology consult - stage IV adenocarcinoma of the lung, prognosis poor, not a candidate for aggressive tx - signed off 10/15  Palliative care on board - family wants  trach/PEG  Prognosis poor   Possible Seizure 10/11  Worsening R gaze forced deviation in setting of HTN, no shaking or jerking - 10/11 am   EEG continuous slowing  Repeat CT head 02/02/2020 slight increase in infarct now involving right caudate head as well but otherwise stable mass-effect on no midline shift  Hypotension/Hypertension  Home meds:  hctz 25, metoprolol 25  Current - Labetalol prn  Off phenylephepdirine   Stable now . BP goal normotensive   Diabetes type II Controlled  Home meds:  None listed  HgbA1c 6.5, goal < 7.0  CBGs  SSI  On levimir 10->12 bid  DB RN following    Dysphagia At risk malnutrition . Secondary to stroke . NPO . On TF  . Son and  daughter agreed with PEG . Trauma consult for PEG, will see today   HIV  On HARRP   CD4 in 07/2019 242  CD4 02/03/2020 185  Other Stroke Risk Factors  Advanced age  Migraines   Other Active Problems  Depression / anxiety  Hx right breast cancer 200 s/p lumpectomy and tamoxifen, recurred 2010 w/ resection, XRT, on arimidex PTA  Meeting with CCM, Palliative Care and family 10/19 - family feels pt would want a tracheostomy and PEG tube and they want to maximize her time on this earth and give her a chance to recovery. (pt remains DNR)  Thrombocytosis - [facility] day # 19 Patient's neurological condition remains unchanged but her prognosis is quite poor given her poor neurological exam as well as failure to wean off ventilatory support despite tracheostomy and new diagnosis of stage IV lung cancer.  Family has agreed to DNR but wish to pursue aggressive care and rehabilitation with hopes patient will improve to be able to get treatment for her stage IV lung cancer.  Neurological status remains quite unchanged for last week or 2 and ongoing medical issues refractory respiratory failure and failure to wean seems to be her most significant problem hence patient has been transferred to critical  care services primary attending.  Family now seems agreeable to consider LTAC transfer if patient gets accepted.  Neurology team will sign off but will be available if any questions come up in the future or family wishes to talk about prognosis again.  Discussed with Dr. Ander Slade critical care team who agrees for him to take over as primary attending.This patient is critically ill and at significant risk of neurological worsening, death and care requires constant monitoring of vital signs, hemodynamics,respiratory and cardiac monitoring, extensive review of multiple databases, frequent neurological assessment, discussion with family, other specialists and medical decision making of high complexity.I have made any additions or clarifications directly to the above note.This critical care time does not reflect procedure time, or teaching time or supervisory time of PA/NP/Med Resident etc but could involve care discussion time.  I spent 30 minutes of neurocritical care time  in the care of  this patient.    Antony Contras, MD  To contact Stroke Continuity provider, please refer to [email or web address] After hours, contact General Neurology

## 2020-02-18 DIAGNOSIS — C3492 Malignant neoplasm of unspecified part of left bronchus or lung: Secondary | ICD-10-CM | POA: Diagnosis not present

## 2020-02-18 DIAGNOSIS — Z9911 Dependence on respirator [ventilator] status: Secondary | ICD-10-CM | POA: Diagnosis not present

## 2020-02-18 DIAGNOSIS — C801 Malignant (primary) neoplasm, unspecified: Secondary | ICD-10-CM | POA: Diagnosis not present

## 2020-02-18 DIAGNOSIS — Z515 Encounter for palliative care: Secondary | ICD-10-CM | POA: Diagnosis not present

## 2020-02-18 DIAGNOSIS — J9601 Acute respiratory failure with hypoxia: Secondary | ICD-10-CM | POA: Diagnosis not present

## 2020-02-18 DIAGNOSIS — I63511 Cerebral infarction due to unspecified occlusion or stenosis of right middle cerebral artery: Secondary | ICD-10-CM | POA: Diagnosis not present

## 2020-02-18 DIAGNOSIS — Z930 Tracheostomy status: Secondary | ICD-10-CM | POA: Diagnosis not present

## 2020-02-18 LAB — CBC
HCT: 26 % — ABNORMAL LOW (ref 36.0–46.0)
Hemoglobin: 8.1 g/dL — ABNORMAL LOW (ref 12.0–15.0)
MCH: 29.5 pg (ref 26.0–34.0)
MCHC: 31.2 g/dL (ref 30.0–36.0)
MCV: 94.5 fL (ref 80.0–100.0)
Platelets: 574 10*3/uL — ABNORMAL HIGH (ref 150–400)
RBC: 2.75 MIL/uL — ABNORMAL LOW (ref 3.87–5.11)
RDW: 15.3 % (ref 11.5–15.5)
WBC: 7.1 10*3/uL (ref 4.0–10.5)
nRBC: 0 % (ref 0.0–0.2)

## 2020-02-18 LAB — BASIC METABOLIC PANEL
Anion gap: 7 (ref 5–15)
BUN: 20 mg/dL (ref 8–23)
CO2: 26 mmol/L (ref 22–32)
Calcium: 8.2 mg/dL — ABNORMAL LOW (ref 8.9–10.3)
Chloride: 102 mmol/L (ref 98–111)
Creatinine, Ser: 0.76 mg/dL (ref 0.44–1.00)
GFR, Estimated: >60 mL/min (ref >60)
Glucose, Bld: 210 mg/dL — ABNORMAL HIGH (ref 70–99)
Potassium: 4.3 mmol/L (ref 3.5–5.1)
Sodium: 135 mmol/L (ref 135–145)

## 2020-02-18 LAB — GLUCOSE, CAPILLARY
Glucose-Capillary: 201 mg/dL — ABNORMAL HIGH (ref 70–99)
Glucose-Capillary: 202 mg/dL — ABNORMAL HIGH (ref 70–99)
Glucose-Capillary: 199 mg/dL — ABNORMAL HIGH (ref 70–99)
Glucose-Capillary: 160 mg/dL — ABNORMAL HIGH (ref 70–99)
Glucose-Capillary: 121 mg/dL — ABNORMAL HIGH (ref 70–99)
Glucose-Capillary: 146 mg/dL — ABNORMAL HIGH (ref 70–99)

## 2020-02-18 NOTE — Progress Notes (Signed)
Inpatient Diabetes Program Recommendations  AACE/ADA: New Consensus Statement on Inpatient Glycemic Control (2015)  Target Ranges:  Prepandial:   less than 140 mg/dL      Peak postprandial:   less than 180 mg/dL (1-2 hours)      Critically ill patients:  140 - 180 mg/dL   Results for GLORIAN, MCDONELL (MRN [id number]) as of 02/18/2020 13:14  Ref. Range 02/17/2020 23:13 02/18/2020 03:14 02/18/2020 07:46 02/18/2020 11:54  Glucose-Capillary Latest Ref Range: 70 - 99 mg/dL 133 (H)  2 units NOVOLOG  201 (H)  5 units NOVOLOG  202 (H)  5 units NOVOLOG  199 (H)  3 units NOVOLOG     Home DM Meds: None listed  Current Orders: Levemir 12 units BID     Novolog Moderate Correction Scale/ SSI (0-15 units) Q4 hours     MD- Note Tube Feeds running 50cc/hr.  Please consider adding Novolog Tube Feed Coverage:  Novolog 3 units Q4 hours  HOLD if tube feeds HELD for any reason    --Will follow patient during hospitalization--  Wyn Quaker RN, MSN, CDE Diabetes Coordinator Inpatient Glycemic Control Team Team Pager: [phone] (8a-5p)

## 2020-02-18 NOTE — Progress Notes (Signed)
Had family meeting with patient's daughter and son along with Dr. Tomasita Morrow and Stanton Kidney from palliative care.  Continue lines of care  Tentatively may be able to have PEG placed on Friday 10/29  Continue weaning as tolerated

## 2020-02-18 NOTE — TOC CAGE-AID Note (Signed)
Transition of Care [facility]) - CAGE-AID Screening   Patient Details  Name: Amber Lopez MRN: [id number] Date of Birth: August 03, 1940  [facility] CM/SW Contact:    Emeterio Reeve, [place] Phone Number: 02/18/2020, 10:51 AM   Clinical Narrative:  Pt is unable to participate due to being on vent.  CAGE-AID Screening: Substance Abuse Screening unable to be completed due to: : Patient unable to participate                  Providence Crosby Clinical Social Worker [phone]

## 2020-02-18 NOTE — Progress Notes (Signed)
Physical Therapy Treatment Patient Details Name: Amber Lopez MRN: [id number] DOB: Nov 19, 1940 Today's Date: 02/18/2020    History of Present Illness 79 y.o. female with a PMHx of depression, HLD, HIV, HTN and DM presenting with acute onset of left hemiplegia, left facial droop, right eye deviation and aphasia. Pt received IV tPA and underwent R MCA revacularization on 01/29/2020. Pt also found to have large left pleural effusion with near complete L lung collapse and L apical mass. Chest tube placed on 01/31/2020. s/p trach on 02/11/20.     PT Comments    Pt acknowledges and responds to question asked from the right, but it is difficult to understand her mouthed words.  Emphasized rolling during peri care, transitions via L side to sitting.  Worked on sitting balance, kicking LE's out, normalizing posture in sitting , PROM of neck toward R laterally, and sit to stand x2.    Follow Up Recommendations  SNF     Equipment Recommendations  Wheelchair (measurements PT);Wheelchair cushion (measurements PT);Hospital bed;3in1 (PT)    Recommendations for Other Services       Precautions / Restrictions Precautions Precautions: Fall Restrictions Weight Bearing Restrictions: No    Mobility  Bed Mobility Overal bed mobility: Needs Assistance Bed Mobility: Supine to Sit;Sit to Supine;Rolling Rolling: Max assist;Total assist (Total R) Sidelying to sit: Total assist;+2 for physical assistance Supine to sit: Total assist;+2 for physical assistance Sit to supine: +2 for physical assistance;Total assist;+2 for safety/equipment   General bed mobility comments: assist for LEs and truncal assist  Transfers Overall transfer level: Needs assistance   Transfers: Sit to/from Stand Sit to Stand: Max assist;+2 safety/equipment;+2 physical assistance         General transfer comment: assist to come forward and boost. completed x2  Ambulation/Gait                 Stairs              Wheelchair Mobility    Modified Rankin (Stroke Patients Only) Modified Rankin (Stroke Patients Only) Pre-Morbid Rankin Score: Slight disability Modified Rankin: Severe disability     Balance Overall balance assessment: Needs assistance Sitting-balance support: Feet unsupported;Single extremity supported Sitting balance-Leahy Scale: Poor Sitting balance - Comments: requiring external assist Postural control: Right lateral lean;Posterior lean Standing balance support: Single extremity supported;Bilateral upper extremity supported;During functional activity Standing balance-Leahy Scale: Zero                              Cognition Arousal/Alertness: Lethargic;Awake/alert Behavior During Therapy: Flat affect Overall Cognitive Status: Difficult to assess Area of Impairment: Following commands;Attention;Problem solving                   Current Attention Level: Sustained   Following Commands: Follows one step commands with increased time;Follows one step commands inconsistently     Problem Solving: Requires verbal cues;Difficulty sequencing;Requires tactile cues        Exercises Other Exercises Other Exercises: warm up ROM and stretching to all extremities Other Exercises: cervical ROM/stretching    General Comments General comments (skin integrity, edema, etc.): flexiseal noted dislodged during session, assisted with pericare and rolling at bed level while RN replacing during session; pt intermittently with elevated RR with prolonged activity, once resting end of session VSS       Pertinent Vitals/Pain Pain Assessment: Faces Faces Pain Scale: Hurts little more Pain Location: generzlied with cervical and certain ROM  Pain  Descriptors / Indicators: Discomfort;Grimacing Pain Intervention(s): Monitored during session;Repositioned    Home Living                      Prior Function            PT Goals (current goals can now be found in  the care plan section) Acute Rehab PT Goals Patient Stated Goal: Pt unable to state  PT Goal Formulation: With patient/family Time For Goal Achievement: 02/29/20 Potential to Achieve Goals: Fair Progress towards PT goals: Progressing toward goals (slow changes)    Frequency    Min 3X/week      PT Plan Current plan remains appropriate    Co-evaluation PT/OT/SLP Co-Evaluation/Treatment: Yes Reason for Co-Treatment: Complexity of the patient's impairments (multi-system involvement) PT goals addressed during session: Mobility/safety with mobility;Strengthening/ROM OT goals addressed during session: Strengthening/ROM      AM-PAC PT "6 Clicks" Mobility   Outcome Measure  Help needed turning from your back to your side while in a flat bed without using bedrails?: Total Help needed moving from lying on your back to sitting on the side of a flat bed without using bedrails?: Total Help needed moving to and from a bed to a chair (including a wheelchair)?: Total Help needed standing up from a chair using your arms (e.g., wheelchair or bedside chair)?: Total Help needed to walk in hospital room?: Total Help needed climbing 3-5 steps with a railing? : Total 6 Click Score: 6    End of Session Equipment Utilized During Treatment: Oxygen Activity Tolerance: Patient limited by fatigue Patient left: in bed;with call bell/phone within reach Nurse Communication: Mobility status PT Visit Diagnosis: Other abnormalities of gait and mobility (R26.89);Muscle weakness (generalized) (M62.81);Hemiplegia and hemiparesis;Other symptoms and signs involving the nervous system (R29.898) Hemiplegia - Right/Left: Left Hemiplegia - dominant/non-dominant: Non-dominant Hemiplegia - caused by: Cerebral infarction     Time: 8280-0349 PT Time Calculation (min) (ACUTE ONLY): 44 min  Charges:  $Therapeutic Activity: 8-22 mins                     02/18/2020  Ginger Carne., PT Acute Rehabilitation  Services [phone]  (pager) [phone]  (office)   Tessie Fass Seirra Kos 02/18/2020, 5:42 PM

## 2020-02-18 NOTE — Progress Notes (Signed)
NAME:  Amber Lopez, MRN:  [id number], DOB:  [birth year], LOS: 3 ADMISSION DATE:  01/29/2020, CONSULTATION DATE:  10/7 REFERRING MD:  Estanislado Pandy, CHIEF COMPLAINT:  Left Sided Weakness   Brief History   79 y/o F, with HIV, admitted 10/7 with LVO of R M1 s/p tPA, neuro IR revascularization.  Returned to ICU post procedure on mechanical ventilation. Additional finding of left superior sulcus mass and complex effusion.   Past Medical History  HIV/AIDS, HTN, HLD, DM, Depression , Arthritis, Breast Cancer   Significant Hospital Events   10/07 Admit with L hemiplegia, facial droop, aphasia with right eye deviation 10/11 Episode of worsened R eye deviation with tachycardia and HTN, concern for sz   Consults:  Neurology, IR  Oncology 10/13 Palliative care 10/13  Procedures:  ETT 10/7 >> 10/20 L Radial ALine 10/7 >>  out R Femoral Sheath 10/7 >> out 10/20 tracheostomy >   Significant Diagnostic Tests:  10/7 CT Head Code Stroke >> no acute finding, generalized atrophy  10/7 CTA Head/Neck >> emergent LVO at the right M1 segment, no visible embolic source, atherosclerosis without flow limiting stenosis or ulceration of major vessels, left superior sulcus mass with large complex pleural effusion, associated mediastinal adenopathy  10/7 CT ABD w/contrast >> Negative for hematoma 10/7 CT Chest w/contrast >> Persistent large left pleural effusion with near complete left lung collapse. Left apical mass measuring 3.5 x 3.3 cm suspicious for bronchogenic carcinoma 10/11 CT head>>no acute findings 10/11 EEG>>Continuous slow, generalized and maximal right frontotemporal region  Micro Data:  COVID 10/7 >> negative  Influenza A/B 10/7 >> negative Pleural fluid culture>>no growth three days 10/9 BCx2>> 1/2 with gram positives 10/7 MRSA screen>>negative 10/11 BC x 1 (from left PICC) > staph hominis 1/2 10/14 trach asp >> few WBC, rare G+ cocci>> few stenotrophomonas maltophilia S to levaquin and  bactrim  10/14 BC >> negative  Antimicrobials:  10/7 cefazolin 10/8 dolutegravir/ emtricitabine/ tenofovir > 10/14 vanc > 10/14 cefepime > 10/20  Interim history/subjective:  No acute event overnight.  Still not able to wean Weaning attempts have been limited by tachypnea  Objective   Blood pressure (!) 151/57, pulse 76, temperature 98.7 F (37.1 C), temperature source Axillary, resp. rate (!) 21, height 4\' 10"  (1.473 m), weight 54.4 kg, SpO2 100 %.    Vent Mode: PRVC FiO2 (%):  [30 %] 30 % Set Rate:  [16 bmp] 16 bmp Vt Set:  [320 mL] 320 mL PEEP:  [5 cmH20] 5 cmH20 Plateau Pressure:  [14 cmH20-23 cmH20] 15 cmH20   Intake/Output Summary (Last 24 hours) at 02/18/2020 1024 Last data filed at 02/18/2020 0900 Gross per 24 hour  Intake 1000 ml  Output 1150 ml  Net -150 ml   Filed Weights   01/29/20 0700 02/13/20 0700  Weight: 53.7 kg 54.4 kg   Examination: Frail, elderly lady on ventilator Clear breath sounds bilaterally S1-S2 appreciated Bowel sounds appreciated Extremities shows no clubbing or edema Neurologically-not following commands but able to attempt to whisper answers   Resolved Hospital Problem list     Assessment & Plan:  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 -Secondary stroke prevention in place -Management of hypertension  Acute respiratory failure with hypoxemia, not weaning Inability to clear secretions -Weaning as tolerated -Transitional care team following for LTAC placement -Day 18 of vent  Pleural effusion Adenocarcinoma of the lung with left pleural effusion -Was seen by oncology -Poor performance status for chemotherapy with metastatic disease  Staff hominis and blood culture -Will complete antibiotics today 10/27  Hypertension -Continue to monitor  HIV/AIDS -On Descovy, Tivacay -Atovaquone  Type 2 diabetes on hyperglycemia -SSI -Levemir to 12 units twice daily  On tube feeds Anemia is chronic -Monitor  for bleeding, transfuse per protocol  Request for transfer to another facility  Nutritional needs -Tube feeds via cortrac  Normocytic anemia Monitor for bleeding Transfuse per protocol  Request for transfer to another facility -I did make contact with [facility]-no beds available -LTAC options been looked into -Daughter did refuse select -Appears that she may need a ventilator medium term and weaning as tolerated -She does not have any acute problems requiring ICU level care -No clear indication for services requiring transfer to a tertiary or quaternary level of care  Goals of care discussion to continue today-meeting at 2 PM  PEG placement pending -There will not make any decisions until after meeting today at 2   Dr. Lake Bells had had discussion with family regarding prognosis Prognosis: poor: likelihood of survival to 6 months is < 5%.  I feel she should be home on hospice.  The family did not agree and insisted on aggressive measures, hence the tracheostomy placed today. Would pursue LTACH placement as their goal is for her to rehab and make it out to be evaluated for lung cancer treatment  Best practice:  Diet: tube feeding  Pain/Anxiety/Delirium protocol (if indicated): as needed VAP protocol (if indicated): yes DVT prophylaxis: lovenox GI prophylaxis: Pantoprazole for stress ulcer prophylaxis Glucose control: SSI Mobility: bed rest Code Status: full Family Communication: Discussed with daughter at bedside 10/26 Disposition: LTACH  Labs   CBC: Recent Labs  Lab 02/14/20 0413 02/15/20 1630 02/16/20 0611 02/17/20 1300 02/18/20 0335  WBC 11.5* 9.6 8.7 8.6 7.1  HGB 8.9* 9.2* 9.0* 9.2* 8.1*  HCT 27.8* 29.5* 28.8* 29.2* 26.0*  MCV 93.3 94.9 96.3 94.2 94.5  PLT 529* 640* 626* 618* 574*    Basic Metabolic Panel: Recent Labs  Lab 02/12/20 0520 02/13/20 0638 02/14/20 0413 02/15/20 1630 02/16/20 0611 02/17/20 1300 02/18/20 0335  NA 135   < > 135 135 135 136 135  K  4.0   < > 4.0 4.7 4.5 4.4 4.3  CL 104   < > 104 101 102 102 102  CO2 23   < > 25 25 25 26 26   GLUCOSE 174*   < > 150* 182* 194* 107* 210*  BUN 20   < > 18 19 21 16 20   CREATININE 0.83   < > 0.83 0.86 0.90 0.76 0.76  CALCIUM 7.8*   < > 8.2* 8.3* 8.5* 8.5* 8.2*  MG 1.8  --   --   --   --   --   --   PHOS 2.2*  --   --   --   --   --   --    < > = values in this interval not displayed.   GFR: Estimated Creatinine Clearance: 41.7 mL/min (by C-G formula based on SCr of 0.76 mg/dL). Recent Labs  Lab 02/15/20 1630 02/16/20 0611 02/17/20 1300 02/18/20 0335  WBC 9.6 8.7 8.6 7.1    Liver Function Tests: Recent Labs  Lab 02/13/20 0638  AST 43*  ALT 62*  ALKPHOS 76  BILITOT 0.5  PROT 6.4*  ALBUMIN 1.4*   No results for input(s): LIPASE, AMYLASE in the last 168 hours. No results for input(s): AMMONIA in the last 168 hours.  ABG  Component Value Date/Time   PHART 7.419 01/30/2020 1042   PCO2ART 26.5 (L) 01/30/2020 1042   PO2ART 143 (H) 01/30/2020 1042   HCO3 17.3 (L) 01/30/2020 1042   TCO2 18 (L) 01/30/2020 1042   ACIDBASEDEF 7.0 (H) 01/30/2020 1042   O2SAT 99.0 01/30/2020 1042     Coagulation Profile: No results for input(s): INR, PROTIME in the last 168 hours.  Cardiac Enzymes: No results for input(s): CKTOTAL, CKMB, CKMBINDEX, TROPONINI in the last 168 hours.  HbA1C: Hgb A1c MFr Bld  Date/Time Value Ref Range Status  01/29/2020 04:22 PM 6.5 (H) 4.8 - 5.6 % Final    Comment:    (NOTE) Pre diabetes:          5.7%-6.4%  Diabetes:              >6.4%  Glycemic control for   <7.0% adults with diabetes     CBG: Recent Labs  Lab 02/17/20 1517 02/17/20 1929 02/17/20 2313 02/18/20 0314 02/18/20 0746  GLUCAP 136* 161* 133* 201* 202*   The patient is critically ill with multiple organ systems failure and requires high complexity decision making for assessment and support, frequent evaluation and titration of therapies, application of advanced monitoring  technologies and extensive interpretation of multiple databases. Critical Care Time devoted to patient care services described in this note independent of APP/resident time (if applicable)  is 33 minutes.   Sherrilyn Rist MD [facility] Personal pager: [phone] If unanswered, please page CCM On-call: [phone]

## 2020-02-18 NOTE — Progress Notes (Signed)
Patient ID: Amber Lopez, female   DOB: [birth year], 79 y.o.   MRN: [id number]  This NP visited patient at the bedside as a follow up for continued assessment for palliative medicine needs and emotional support to family.    Met with patient's daughter and son as scheduled.  Family updated from CCM/Dr. Ander Slade and trauma/Dr. Bobbye Morton. Family was given the information that there have been no bed offers for transfer of care. Decision is to move forward with PEG   I then meet with patient's daughter/[name] and son/JR at bedside for continued education  regarding diagnosis, prognosis, treatment option decisions, advanced directive decisions and anticipatory care needs.  Created space and opportunity for family to explore their thoughts and feelings regarding their mother's current medical situation.   Both patient and son verbalized understanding of the seriousness of the patient's current medical situation.  They are hearing the medical team's concern regarding continued aggressive measures within the context of terminal disease.  However they are firm in their wish to proceed with PEG placement and other life prolonging measures in order to give patient the opportunity for continued life.  They do not perceive this situation as suffering.  Education offered regarding next steps and transition of care, information given regarding LTAC versus SNF.  We discussed the limited options for ventilated patients  Therapeutic listening and emotional support.    This nurse practitioner informed  the family and the attending that I will be out of the hospital until Monday morning.  If the patient is still hospitalized I will follow-up at that time.  Call palliative medicine team phone # [phone] with questions or concerns.  Questions and concerns addressed      Total time spent on the unit was 60 minutes  PMT will continue to support holistically  Greater than 50% of the time was spent in counseling and  coordination of care  Wadie Lessen NP  Palliative Medicine Team Team Phone

## 2020-02-18 NOTE — Progress Notes (Signed)
Occupational Therapy Treatment Patient Details Name: Amber Lopez MRN: [id number] DOB: 1941-03-27 Today's Date: 02/18/2020    History of present illness 79 y.o. female with a PMHx of depression, HLD, HIV, HTN and DM presenting with acute onset of left hemiplegia, left facial droop, right eye deviation and aphasia. Pt received IV tPA and underwent R MCA revacularization on 01/29/2020. Pt also found to have large left pleural effusion with near complete L lung collapse and L apical mass. Chest tube placed on 01/31/2020. s/p trach on 02/11/20.    OT comments  Pt with slow progress towards OT goals. Tolerated EOB activity but still requires heavy two person assist for all aspects of mobility. Pt currently totalA for ADL. Pt with notable stiffness in cervical region. She attempts to mouth words during session but difficult to understand. Limited command follow today. Will continue per POC at this time.   Follow Up Recommendations  SNF    Equipment Recommendations  None recommended by OT          Precautions / Restrictions Precautions Precautions: Fall Restrictions Weight Bearing Restrictions: No       Mobility Bed Mobility Overal bed mobility: Needs Assistance Bed Mobility: Supine to Sit;Sit to Supine;Rolling Rolling: Max assist;Total assist (Total R) Sidelying to sit:  (total R) Supine to sit: Total assist;+2 for physical assistance Sit to supine: +2 for physical assistance;Total assist;+2 for safety/equipment   General bed mobility comments: assist for LEs and truncal assist  Transfers Overall transfer level: Needs assistance   Transfers: Sit to/from Stand Sit to Stand: Max assist;+2 safety/equipment;+2 physical assistance         General transfer comment: assist to come forward and boost. completed x2    Balance Overall balance assessment: Needs assistance Sitting-balance support: Feet unsupported;Single extremity supported Sitting balance-Leahy Scale: Poor Sitting  balance - Comments: requiring external assist Postural control: Right lateral lean;Posterior lean Standing balance support: Single extremity supported;Bilateral upper extremity supported;During functional activity Standing balance-Leahy Scale: Zero                             ADL either performed or assessed with clinical judgement   ADL Overall ADL's : Needs assistance/impaired                                       General ADL Comments: totalA     Vision       Perception     Praxis      Cognition Arousal/Alertness: Lethargic;Awake/alert Behavior During Therapy: Flat affect Overall Cognitive Status: Difficult to assess Area of Impairment: Following commands;Attention;Problem solving                   Current Attention Level: Sustained   Following Commands: Follows one step commands with increased time;Follows one step commands inconsistently     Problem Solving: Requires verbal cues;Difficulty sequencing;Requires tactile cues          Exercises Exercises: Other exercises Other Exercises Other Exercises: warm up ROM and stretching to all extremities Other Exercises: cervical ROM/stretching   Shoulder Instructions       General Comments flexiseal noted dislodged during session, assisted with pericare and rolling at bed level while RN replacing during session; pt intermittently with elevated RR with prolonged activity, once resting end of session VSS     Pertinent Vitals/ Pain  Pain Assessment: Faces Faces Pain Scale: Hurts little more Pain Location: generzlied with cervial and certain ROM  Pain Descriptors / Indicators: Discomfort;Grimacing Pain Intervention(s): Monitored during session;Repositioned  Home Living                                          Prior Functioning/Environment              Frequency  Min 2X/week        Progress Toward Goals  OT Goals(current goals can now be found  in the care plan section)  Progress towards OT goals: Progressing toward goals  Acute Rehab OT Goals Patient Stated Goal: Pt unable to state  OT Goal Formulation: With patient Time For Goal Achievement: 02/18/20 Potential to Achieve Goals: [place] Discharge plan remains appropriate    Co-evaluation    PT/OT/SLP Co-Evaluation/Treatment: Yes Reason for Co-Treatment: Complexity of the patient's impairments (multi-system involvement);For patient/therapist safety;To address functional/ADL transfers   OT goals addressed during session: Strengthening/ROM      AM-PAC OT "6 Clicks" Daily Activity     Outcome Measure   Help from another person eating meals?: Total Help from another person taking care of personal grooming?: A Lot Help from another person toileting, which includes using toliet, bedpan, or urinal?: Total Help from another person bathing (including washing, rinsing, drying)?: Total Help from another person to put on and taking off regular upper body clothing?: Total Help from another person to put on and taking off regular lower body clothing?: Total 6 Click Score: 7    End of Session Equipment Utilized During Treatment: Oxygen  OT Visit Diagnosis: Hemiplegia and hemiparesis Hemiplegia - Right/Left: Left Hemiplegia - dominant/non-dominant: Non-Dominant Hemiplegia - caused by: Cerebral infarction   Activity Tolerance Patient tolerated treatment well   Patient Left in bed;with call bell/phone within reach;with restraints reapplied   Nurse Communication Mobility status        Time: 0071-2197 OT Time Calculation (min): 44 min  Charges: OT General Charges $OT Visit: 1 Visit OT Treatments $Self Care/Home Management : 23-37 mins  Lou Cal, OT Acute Rehabilitation Services Pager [phone] Office [phone]    Raymondo Band 02/18/2020, 5:18 PM

## 2020-02-19 ENCOUNTER — Inpatient Hospital Stay (HOSPITAL_COMMUNITY): Payer: Medicare Other

## 2020-02-19 ENCOUNTER — Encounter (HOSPITAL_COMMUNITY): Payer: Self-pay | Admitting: Neurology

## 2020-02-19 DIAGNOSIS — C801 Malignant (primary) neoplasm, unspecified: Secondary | ICD-10-CM | POA: Diagnosis not present

## 2020-02-19 DIAGNOSIS — J9601 Acute respiratory failure with hypoxia: Secondary | ICD-10-CM | POA: Diagnosis not present

## 2020-02-19 DIAGNOSIS — I63511 Cerebral infarction due to unspecified occlusion or stenosis of right middle cerebral artery: Secondary | ICD-10-CM | POA: Diagnosis not present

## 2020-02-19 DIAGNOSIS — C3492 Malignant neoplasm of unspecified part of left bronchus or lung: Secondary | ICD-10-CM | POA: Diagnosis not present

## 2020-02-19 LAB — GLUCOSE, CAPILLARY
Glucose-Capillary: 121 mg/dL — ABNORMAL HIGH (ref 70–99)
Glucose-Capillary: 126 mg/dL — ABNORMAL HIGH (ref 70–99)
Glucose-Capillary: 136 mg/dL — ABNORMAL HIGH (ref 70–99)
Glucose-Capillary: 117 mg/dL — ABNORMAL HIGH (ref 70–99)
Glucose-Capillary: 170 mg/dL — ABNORMAL HIGH (ref 70–99)
Glucose-Capillary: 142 mg/dL — ABNORMAL HIGH (ref 70–99)

## 2020-02-19 MED ORDER — OXYCODONE HCL 5 MG/5ML PO SOLN
5.0000 mg | Freq: Four times a day (QID) | ORAL | Status: DC | PRN
  Administered 2020-02-19 – 2020-02-23 (×8): 5 mg
  Filled 2020-02-19 (×8): qty 5

## 2020-02-19 MED ORDER — NUTRISOURCE FIBER PO PACK
1.0000 | PACK | Freq: Two times a day (BID) | ORAL | Status: DC
  Administered 2020-02-19 – 2020-02-28 (×19): 1
  Filled 2020-02-19 (×20): qty 1

## 2020-02-19 MED ORDER — OSMOLITE 1.2 CAL PO LIQD
1000.0000 mL | ORAL | Status: DC
  Administered 2020-02-20 – 2020-02-23 (×2): 1000 mL
  Filled 2020-02-19: qty 1000

## 2020-02-19 NOTE — Progress Notes (Signed)
Physical Therapy Treatment Patient Details Name: Amber Lopez MRN: [id number] DOB: December 13, 1940 Today's Date: 02/19/2020    History of Present Illness 79 y.o. female with a PMHx of depression, HLD, HIV, HTN and DM presenting with acute onset of left hemiplegia, left facial droop, right eye deviation and aphasia. Pt received IV tPA and underwent R MCA revacularization on 01/29/2020. Pt also found to have large left pleural effusion with near complete L lung collapse and L apical mass. Chest tube placed on 01/31/2020. s/p trach on 02/11/20.     PT Comments    Pt asleep on arrival.  Quickly alert.  Slow improvements toward goals.  Emphasis on transitions, sitting balance, sit to stand.  Encouraging/facilitating functional use of R side to assist with balance.  Stressed cervical PROM.    Follow Up Recommendations  SNF     Equipment Recommendations  Wheelchair (measurements PT);Wheelchair cushion (measurements PT);Hospital bed;3in1 (PT)    Recommendations for Other Services       Precautions / Restrictions Precautions Precautions: Fall    Mobility  Bed Mobility Overal bed mobility: Needs Assistance Bed Mobility: Supine to Sit;Sit to Supine;Rolling Rolling: Max assist;Total assist (Total R) Sidelying to sit: Total assist;+2 for physical assistance Supine to sit: Total assist;+2 for physical assistance Sit to supine: +2 for physical assistance;Total assist;+2 for safety/equipment   General bed mobility comments: assist for LEs and truncal assist  Transfers Overall transfer level: Needs assistance   Transfers: Sit to/from Stand Sit to Stand: Max assist;+2 safety/equipment         General transfer comment: assist to come forward and boost. Stood x25 sec for peri care.  Ambulation/Gait                 Stairs             Wheelchair Mobility    Modified Rankin (Stroke Patients Only) Modified Rankin (Stroke Patients Only) Pre-Morbid Rankin Score: Slight  disability Modified Rankin: Severe disability     Balance Overall balance assessment: Needs assistance Sitting-balance support: Feet unsupported;Single extremity supported Sitting balance-Leahy Scale: Poor Sitting balance - Comments: requiring external assist, sat EOB ~12 min working on upright posture, trying for midline orientation, coming into/out of BOS, improved use of R UE to balance Postural control: Right lateral lean;Posterior lean Standing balance support: Single extremity supported;Bilateral upper extremity supported;During functional activity Standing balance-Leahy Scale: Zero                              Cognition Arousal/Alertness: Lethargic;Awake/alert Behavior During Therapy: Flat affect Overall Cognitive Status: Difficult to assess Area of Impairment: Following commands;Attention;Problem solving                   Current Attention Level: Sustained   Following Commands: Follows one step commands with increased time;Follows one step commands inconsistently     Problem Solving: Requires verbal cues;Difficulty sequencing;Requires tactile cues        Exercises Other Exercises Other Exercises: warm up ROM and stretching to all extremities Other Exercises: cervical ROM/stretching    General Comments General comments (skin integrity, edema, etc.): vss, flexiseal leaking significantly.  Had RN/staff called to help clean up the bed.      Pertinent Vitals/Pain Pain Assessment: Faces Faces Pain Scale: Hurts little more Pain Location: generzlied with cervical and certain ROM  Pain Descriptors / Indicators: Discomfort;Grimacing Pain Intervention(s): Monitored during session    Home Living  Prior Function            PT Goals (current goals can now be found in the care plan section) Acute Rehab PT Goals Patient Stated Goal: Pt unable to state  PT Goal Formulation: With patient/family Time For Goal Achievement:  02/29/20 Potential to Achieve Goals: Fair    Frequency    Min 3X/week      PT Plan Current plan remains appropriate    Co-evaluation              AM-PAC PT "6 Clicks" Mobility   Outcome Measure  Help needed turning from your back to your side while in a flat bed without using bedrails?: Total Help needed moving from lying on your back to sitting on the side of a flat bed without using bedrails?: Total Help needed moving to and from a bed to a chair (including a wheelchair)?: Total Help needed standing up from a chair using your arms (e.g., wheelchair or bedside chair)?: Total Help needed to walk in hospital room?: Total Help needed climbing 3-5 steps with a railing? : Total 6 Click Score: 6    End of Session Equipment Utilized During Treatment: Oxygen Activity Tolerance: Patient limited by fatigue Patient left: in bed;with call bell/phone within reach Nurse Communication: Mobility status PT Visit Diagnosis: Other abnormalities of gait and mobility (R26.89);Muscle weakness (generalized) (M62.81);Hemiplegia and hemiparesis;Other symptoms and signs involving the nervous system (R29.898) Hemiplegia - Right/Left: Left Hemiplegia - dominant/non-dominant: Non-dominant Hemiplegia - caused by: Cerebral infarction     Time: 0092-3300 PT Time Calculation (min) (ACUTE ONLY): 31 min  Charges:  $Therapeutic Activity: 8-22 mins $Neuromuscular Re-education: 8-22 mins                     02/19/2020  Ginger Carne., PT Acute Rehabilitation Services [phone]  (pager) [phone]  (office)   Tessie Fass Dahlila Pfahler 02/19/2020, 2:18 PM

## 2020-02-19 NOTE — Progress Notes (Signed)
This nurse instructed patient on procedure. HOB less than 45*. Pressure held after removal of line for 5 min. No s/sx of bleeding. Pressure drsg applied to site. Nurse notified of PICC removal. Fran Lowes, RN VAST

## 2020-02-19 NOTE — Progress Notes (Signed)
Spoke with Estill Bamberg RN re PICC CXR results.  PICC is not in the SVC.  Recommendations made to have PICC line exchanged if still needed OR to place PIV and d/c PICC line.  Pt currently only receiving Fentanyl prn via IV access.Estill Bamberg to follow up with MD.

## 2020-02-19 NOTE — Progress Notes (Signed)
NAME:  Amber Lopez, MRN:  [id number], DOB:  February 26, 1941, LOS: 21 ADMISSION DATE:  01/29/2020, CONSULTATION DATE:  10/7 REFERRING MD:  Estanislado Pandy, CHIEF COMPLAINT:  Left Sided Weakness   Brief History   79 y/o F, with HIV, admitted 10/7 with LVO of R M1 s/p tPA, neuro IR revascularization.  Returned to ICU post procedure on mechanical ventilation. Additional finding of left superior sulcus mass and complex effusion.   Past Medical History  HIV/AIDS, HTN, HLD, DM, Depression , Arthritis, Breast Cancer   Significant Hospital Events   10/07 Admit with L hemiplegia, facial droop, aphasia with right eye deviation 10/11 Episode of worsened R eye deviation with tachycardia and HTN, concern for sz   Consults:  Neurology, IR  Oncology 10/13 Palliative care 10/13  Procedures:  ETT 10/7 >> 10/20 L Radial ALine 10/7 >>  out R Femoral Sheath 10/7 >> out 10/20 tracheostomy    Significant Diagnostic Tests:  10/7 CT Head Code Stroke >> no acute finding, generalized atrophy  10/7 CTA Head/Neck >> emergent LVO at the right M1 segment, no visible embolic source, atherosclerosis without flow limiting stenosis or ulceration of major vessels, left superior sulcus mass with large complex pleural effusion, associated mediastinal adenopathy  10/7 CT ABD w/contrast >> Negative for hematoma 10/7 CT Chest w/contrast >> Persistent large left pleural effusion with near complete left lung collapse. Left apical mass measuring 3.5 x 3.3 cm suspicious for bronchogenic carcinoma 10/11 CT head>>no acute findings 10/11 EEG>>Continuous slow, generalized and maximal right frontotemporal region  Micro Data:  COVID 10/7 >> negative  Influenza A/B 10/7 >> negative Pleural fluid culture>>no growth three days 10/9 BCx2>> 1/2 with gram positives 10/7 MRSA screen>>negative 10/11 BC x 1 (from left PICC) > staph hominis 1/2 10/14 trach asp >> few WBC, rare G+ cocci>> few stenotrophomonas maltophilia S to levaquin and  bactrim  10/14 BC >> negative  Antimicrobials:  10/7 cefazolin 10/8 dolutegravir/ emtricitabine/ tenofovir > 10/14 vanc > 10/14 cefepime > 10/20  Interim history/subjective:  No acute event overnight.  Still not able to wean Weaning attempts continue to be limited by tachypnea  Objective   Blood pressure (!) 123/47, pulse 88, temperature 99 F (37.2 C), temperature source Oral, resp. rate (!) 35, height 4\' 10"  (1.473 m), weight 56.8 kg, SpO2 100 %.    Vent Mode: PRVC FiO2 (%):  [30 %] 30 % Set Rate:  [16 bmp] 16 bmp Vt Set:  [320 mL] 320 mL PEEP:  [5 cmH20] 5 cmH20 Plateau Pressure:  [15 QMV78-46 cmH20] 20 cmH20   Intake/Output Summary (Last 24 hours) at 02/19/2020 1036 Last data filed at 02/19/2020 0400 Gross per 24 hour  Intake 1150.09 ml  Output 1300 ml  Net -149.91 ml   Filed Weights   01/29/20 0700 02/13/20 0700 02/19/20 0500  Weight: 53.7 kg 54.4 kg 56.8 kg   Examination: Frail elderly lady on a ventilator Clear breath sounds bilaterally Bowel sounds appreciated S1-S2 appreciated Extremities shows no clubbing or edema Not following commands but able to answer questions  Resolved Hospital Problem list     Assessment & Plan:  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 -Secondary stroke prevention in place -Management of hypertension  Acute respiratory failure with hypoxemia, not weaning Inability to clear secretions Requiring frequent suctioning -Wean as tolerated -Transitional care team following for LTAC placement Day 19 on vent  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 -Secondary stroke prevention in place -Management  of hypertension  Pleural effusion Adenocarcinoma of the left lung with left pleural effusion -Chest x-ray has been stable -Poor performance status for chemotherapy with metastatic disease  Staph hominis blood cultures -Completed antibiotic therapy  Hypertension -Continue to monitor  HIV/AIDS -On  Descovy, tivacay -Atovaquone  Type 2 diabetes -Continue Levemir -Continue SSI  Continue tube feeds -For PEG placement tentatively 10/29   Normocytic anemia Monitor for bleeding Transfuse per protocol  Request for transfer to another facility -I did make contact with [facility]-no beds available -LTAC options being looked into  -Daughter did refuse select -Appears that she may need a ventilator medium term and weaning as tolerated -She does not have any acute problems requiring ICU level care -No clear indication for services requiring transfer to a tertiary or quaternary level of care  Had family meeting yesterday 10/27 -We will continue current lines of care -Family aware that we are still trying to wean her as tolerated -Agreeable to have PEG placed 10/29   Dr. Lake Bells had had discussion with family regarding prognosis Prognosis: poor: likelihood of survival to 6 months is < 5%.  I feel she should be home on hospice.  The family did not agree and insisted on aggressive measures, hence the tracheostomy placed today. Would pursue LTACH placement as their goal is for her to rehab and make it out to be evaluated for lung cancer treatment  Best practice:  Diet: Continue tube feeding Pain/Anxiety/Delirium protocol (if indicated): as needed VAP protocol (if indicated): yes DVT prophylaxis: lovenox GI prophylaxis: Pantoprazole for stress ulcer prophylaxis Glucose control: SSI Mobility: bed rest Code Status: full Family Communication: Discussed with daughter and son 10/27 Disposition: LTAC-if we can find a bed  Labs   CBC: Recent Labs  Lab 02/14/20 0413 02/15/20 1630 02/16/20 0611 02/17/20 1300 02/18/20 0335  WBC 11.5* 9.6 8.7 8.6 7.1  HGB 8.9* 9.2* 9.0* 9.2* 8.1*  HCT 27.8* 29.5* 28.8* 29.2* 26.0*  MCV 93.3 94.9 96.3 94.2 94.5  PLT 529* 640* 626* 618* 574*    Basic Metabolic Panel: Recent Labs  Lab 02/14/20 0413 02/15/20 1630 02/16/20 0611 02/17/20 1300  02/18/20 0335  NA 135 135 135 136 135  K 4.0 4.7 4.5 4.4 4.3  CL 104 101 102 102 102  CO2 25 25 25 26 26   GLUCOSE 150* 182* 194* 107* 210*  BUN 18 19 21 16 20   CREATININE 0.83 0.86 0.90 0.76 0.76  CALCIUM 8.2* 8.3* 8.5* 8.5* 8.2*   GFR: Estimated Creatinine Clearance: 42.6 mL/min (by C-G formula based on SCr of 0.76 mg/dL). Recent Labs  Lab 02/15/20 1630 02/16/20 0611 02/17/20 1300 02/18/20 0335  WBC 9.6 8.7 8.6 7.1    Liver Function Tests: Recent Labs  Lab 02/13/20 0638  AST 43*  ALT 62*  ALKPHOS 76  BILITOT 0.5  PROT 6.4*  ALBUMIN 1.4*   No results for input(s): LIPASE, AMYLASE in the last 168 hours. No results for input(s): AMMONIA in the last 168 hours.  ABG    Component Value Date/Time   PHART 7.419 01/30/2020 1042   PCO2ART 26.5 (L) 01/30/2020 1042   PO2ART 143 (H) 01/30/2020 1042   HCO3 17.3 (L) 01/30/2020 1042   TCO2 18 (L) 01/30/2020 1042   ACIDBASEDEF 7.0 (H) 01/30/2020 1042   O2SAT 99.0 01/30/2020 1042     Coagulation Profile: No results for input(s): INR, PROTIME in the last 168 hours.  Cardiac Enzymes: No results for input(s): CKTOTAL, CKMB, CKMBINDEX, TROPONINI in the last 168 hours.  HbA1C: Hgb A1c MFr Bld  Date/Time Value Ref Range Status  01/29/2020 04:22 PM 6.5 (H) 4.8 - 5.6 % Final    Comment:    (NOTE) Pre diabetes:          5.7%-6.4%  Diabetes:              >6.4%  Glycemic control for   <7.0% adults with diabetes     CBG: Recent Labs  Lab 02/18/20 1541 02/18/20 1921 02/18/20 2321 02/19/20 0342 02/19/20 0746  GLUCAP 160* 121* 146* 121* 126*   The patient is critically ill with multiple organ systems failure and requires high complexity decision making for assessment and support, frequent evaluation and titration of therapies, application of advanced monitoring technologies and extensive interpretation of multiple databases. Critical Care Time devoted to patient care services described in this note independent of  APP/resident time (if applicable)  is 30 minutes.   Sherrilyn Rist MD [facility] Personal pager: [phone] If unanswered, please page CCM On-call: [phone]

## 2020-02-19 NOTE — Progress Notes (Signed)
10:40 Spoke with pt's RN regarding PICC tip in azygos. Informed her that IV team policy is to do power flush then get CXR 81min-1hour later. She agreed to get CXR. Sat pt. Up at 90 degree angle. Power flushed all 3 ports simultanously with 10cc NS each. Left pt. Sitting up until CXR done.

## 2020-02-19 NOTE — Progress Notes (Signed)
MD notified of PICC malposition, OK to use per MD. IV team notified and will perform maneuvers to attempt to reposition.

## 2020-02-19 NOTE — Progress Notes (Signed)
Nutrition Follow-up  DOCUMENTATION CODES:   Non-severe (moderate) malnutrition in context of chronic illness  INTERVENTION:   Tube Feeding via Cortrak: Increase Osmolite 1.2 to 60 ml/hr D/C Pro-Source 45 mL BID Provides 80 g of protein, 1728 kcals and 1166 mL of free water Meets 100% estimated calorie and protein needs  Add Nutrisource Fiber BID   NUTRITION DIAGNOSIS:   Moderate Malnutrition related to chronic illness (HIV) as evidenced by mild muscle depletion, moderate muscle depletion, mild fat depletion, moderate fat depletion.  Being addressed via TF   GOAL:   Patient will meet greater than or equal to 90% of their needs  Progressing  MONITOR:   Vent status, Weight trends, TF tolerance, Skin, Labs  REASON FOR ASSESSMENT:   Consult Enteral/tube feeding initiation and management  ASSESSMENT:   79 yo female presents with left sided weakness, facial droop, aphasia and right eye deviation and  admitted with ischemic stoke insetting of large vessel occlusion of right MCA M1 segment requiring revascularization, acute blood loss anemia with concern of retroperitoneal bleed, respiratory failure requiring intubation. PMH includes HIV, HTN, HLD, DM, depression, hx of breast cancer  10/07 - admitted, intubated, s/p revascularization of R MCA M1 occlusion 10/08 - CT chest with new mass consistent with malignant adenocarcinoma, RI multifocal infarct with mild petechial hemorrhage with small L cerebella infarct 10/09 - chest tube placed for large left pleural effusion 10/19 - GOC discussion, plan for trach/PEG 10/20 - trach, cortrak placed  Pt remains on vent support via trach; weaning attempts have been unsuccessful   Possible PEG placement on 10/29 Pt tolerating Osmolite 1.2 at 50 ml/hr with Pro-Source 45 mL BID via Cortrak tube  Noted weight 54.4 kg on 10/22. Weight 56.8 kg today. Admit weight 53.7 kg  Pt with liquid stool via rectal tube; add nutrisource fiber to see  if this will improve stool consistency and frequency so that rectal tube can be removed  Labs: reviewed Meds: ss novolog, levemir BID   Diet Order:   Diet Order    None      EDUCATION NEEDS:   Not appropriate for education at this time  Skin:  Skin Assessment: Skin Integrity Issues: Skin Integrity Issues:: Stage II Stage II: upper lip from ETT  Last BM:  10/28 rectal tube  Height:   Ht Readings from Last 1 Encounters:  01/29/20 4\' 10"  (1.473 m)    Weight:   Wt Readings from Last 1 Encounters:  02/19/20 56.8 kg    BMI:  Body mass index is 26.17 kg/m.  Estimated Nutritional Needs:   Kcal:  1650-1850 kcals  Protein:  75-90 g  Fluid:  >/= 1.7  L   Kerman Passey MS, RDN, LDN, CNSC Registered Dietitian III Clinical Nutrition RD Pager and On-Call Pager Number Located in [place]

## 2020-02-20 ENCOUNTER — Encounter (HOSPITAL_COMMUNITY)
Admission: EM | Disposition: A | Discharge status: Skilled Nursing Facility | Payer: Self-pay | Source: Home / Self Care | Attending: Internal Medicine

## 2020-02-20 DIAGNOSIS — I6300 Cerebral infarction due to thrombosis of unspecified precerebral artery: Secondary | ICD-10-CM | POA: Diagnosis not present

## 2020-02-20 DIAGNOSIS — C3492 Malignant neoplasm of unspecified part of left bronchus or lung: Secondary | ICD-10-CM | POA: Diagnosis not present

## 2020-02-20 HISTORY — PX: PEG PLACEMENT: SHX5437

## 2020-02-20 LAB — GLUCOSE, CAPILLARY
Glucose-Capillary: 43 mg/dL — CL (ref 70–99)
Glucose-Capillary: 112 mg/dL — ABNORMAL HIGH (ref 70–99)
Glucose-Capillary: 56 mg/dL — ABNORMAL LOW (ref 70–99)
Glucose-Capillary: 60 mg/dL — ABNORMAL LOW (ref 70–99)
Glucose-Capillary: 113 mg/dL — ABNORMAL HIGH (ref 70–99)
Glucose-Capillary: 97 mg/dL (ref 70–99)
Glucose-Capillary: 90 mg/dL (ref 70–99)
Glucose-Capillary: 153 mg/dL — ABNORMAL HIGH (ref 70–99)
Glucose-Capillary: 201 mg/dL — ABNORMAL HIGH (ref 70–99)

## 2020-02-20 SURGERY — INSERTION, PEG TUBE
Anesthesia: Moderate Sedation

## 2020-02-20 SURGERY — CANCELLED PROCEDURE

## 2020-02-20 MED ORDER — DEXTROSE 50 % IV SOLN
INTRAVENOUS | Status: AC
  Administered 2020-02-20: 25 mL
  Filled 2020-02-20: qty 50

## 2020-02-20 MED ORDER — MIDAZOLAM HCL 2 MG/2ML IJ SOLN: 10.0000 mg | Freq: Once | INTRAMUSCULAR | Status: AC

## 2020-02-20 MED ORDER — DEXTROSE 50 % IV SOLN
INTRAVENOUS | Status: AC
  Administered 2020-02-20: 50 mL
  Filled 2020-02-20: qty 50

## 2020-02-20 MED ORDER — DEXTROSE 50 % IV SOLN
25.0000 g | INTRAVENOUS | Status: AC
  Administered 2020-02-20: 25 g via INTRAVENOUS

## 2020-02-20 MED ORDER — FENTANYL CITRATE (PF) 100 MCG/2ML IJ SOLN
INTRAMUSCULAR | Status: AC
  Filled 2020-02-20: qty 2

## 2020-02-20 MED ORDER — FENTANYL CITRATE (PF) 100 MCG/2ML IJ SOLN
100.0000 ug | Freq: Once | INTRAMUSCULAR | Status: AC
  Administered 2020-02-20: 100 ug via INTRAVENOUS

## 2020-02-20 MED ORDER — MIDAZOLAM HCL 2 MG/2ML IJ SOLN
INTRAMUSCULAR | Status: AC
  Administered 2020-02-20: 3 mg via INTRAVENOUS
  Filled 2020-02-20: qty 10

## 2020-02-20 MED ORDER — DEXTROSE IN LACTATED RINGERS 5 % IV SOLN: INTRAVENOUS | Status: DC

## 2020-02-20 NOTE — Progress Notes (Signed)
   Procedure Note  Date: 02/20/2020  Procedure: esophagogastroduodenoscopy (EGD) and percutaneous endoscopic gastrostomy (PEG) tube placement  Pre-op diagnosis: dysphagia, malnutrition Post-op diagnosis: same  Indication and clinical history: 65F   Surgeon: Jesusita Oka, MD Assistant: Maxwell Caul, [place]  Anesthesia: MAC, 168mg fentanyl, 380mversed  Findings: small hiatal hernia . Specimen: none . EBL: <5cc . Drains/Implants: PEG tube, 4.5cm at the skin   Disposition: ICU/PACU  Description of Procedure: The patient was positioned semi-recumbent. Time-out was performed verifying correct patient, procedure, signature of informed consent, and pre-operative antibiotics as indicated. MAC induction was uneventful and a bite block was placed into the oropharynx. The endoscope was inserted into the oropharynx and advanced down the esophagus into the stomach and into the duodenum. The visualized esophagus and duodenum were unremarkable. The endoscope was retracted back into the stomach and the stomach was insufflated. The stomach was inspected and was also normal. Transillumination was performed. The light was visible on the external skin and dimpling of the stomach was noted endoscopically with manual pressure. The abdomen was prepped and draped in the usual sterile fashion. Transillumination and dimpling were repeated and local anesthetic was infiltrated to make a skin wheal at the site of transillumination. The needle was inserted perpendicularly to the skin and the tip of the needle was visualized endoscopically. As the needle was retracted, the tract was also anesthetized. A skin nick was made at the site of the wheal and an introducer needle and sheath were inserted. The needle was removed and guidewire inserted. The guidewire was grasped by an endoscopic snare and the snare, guidewire, and endoscope retracted out of the oropharynx. The PEG tube was secured to the guidewire and retracted through  the mouth and esophagus into the stomach. The PEG tube was secured with a bolster and was visualized endoscopically to spin freely circumferentially and also be without gaps between the internal bumper and the stomach wall. There was no evidence of bleeding. The PEG bolster was secured at 4.5cm at the skin and there were no gaps between the bolster and the abdominal wall. The stomach was desufflated endoscopically and the endoscope removed. The bite block was also removed. The patient tolerated the procedure well and there were no complications.   The patient may have water and medications administered via the PEG tube beginning immediately and tube feeds may be initiated four hours post-procedure.    AyJesusita OkaMD General and [facility]

## 2020-02-20 NOTE — Progress Notes (Signed)
[facility] Progress Note Patient Name: SAKOYA WIN DOB: 1940-08-21 MRN: [id number]   Date of Service  02/20/2020  HPI/Events of Note  Hypoglycemia. Patient is NPO for PEG tube placement.  eICU Interventions  D 5 % LR started at 75 ml / hour.        Kerry Kass Kaytee Taliercio 02/20/2020, 6:07 AM

## 2020-02-20 NOTE — Progress Notes (Signed)
Placed pt on PSV 12/5 and pt is tolerating well at this time. RN aware.

## 2020-02-20 NOTE — Progress Notes (Signed)
Trauma/Critical Care Follow Up Note  Subjective:    Overnight Issues:   Objective:  Vital signs for last 24 hours: Temp:  [98.2 F (36.8 C)-99.7 F (37.6 C)] 98.4 F (36.9 C) (10/29 0400) Pulse Rate:  [60-118] 75 (10/29 0500) Resp:  [15-35] 23 (10/29 0500) BP: (104-149)/(42-102) 141/62 (10/29 0500) SpO2:  [93 %-100 %] 100 % (10/29 0500) FiO2 (%):  [30 %] 30 % (10/29 0328) Weight:  [55.9 kg] 55.9 kg (10/29 0500)  Hemodynamic parameters for last 24 hours:    Intake/Output from previous day: 10/28 0701 - 10/29 0700 In: 1015 [NG/GT:1015] Out: 1150 [Urine:900; Stool:250]  Intake/Output this shift: Total I/O In: 240 [NG/GT:240] Out: 400 [Urine:300; Stool:100]  Vent settings for last 24 hours: Vent Mode: PRVC FiO2 (%):  [30 %] 30 % Set Rate:  [16 bmp] 16 bmp Vt Set:  [320 mL] 320 mL PEEP:  [5 cmH20] 5 cmH20 Plateau Pressure:  [14 cmH20-22 cmH20] 15 cmH20  Physical Exam:  Gen: comfortable, no distress Neuro: interactive, f/c HEENT: intubated Neck: c-collar in place CV: RRR Pulm: unlabored breathing, mechanically ventilated Abd: soft, nontender GU: clear, yellow urine Extr: wwp, no edema   Results for orders placed or performed during the hospital encounter of 01/29/20 (from the past 24 hour(s))  Glucose, capillary     Status: Abnormal   Collection Time: 02/19/20  7:46 AM  Result Value Ref Range   Glucose-Capillary 126 (H) 70 - 99 mg/dL  Glucose, capillary     Status: Abnormal   Collection Time: 02/19/20 11:13 AM  Result Value Ref Range   Glucose-Capillary 136 (H) 70 - 99 mg/dL  Glucose, capillary     Status: Abnormal   Collection Time: 02/19/20  3:39 PM  Result Value Ref Range   Glucose-Capillary 117 (H) 70 - 99 mg/dL  Glucose, capillary     Status: Abnormal   Collection Time: 02/19/20  7:14 PM  Result Value Ref Range   Glucose-Capillary 170 (H) 70 - 99 mg/dL  Glucose, capillary     Status: Abnormal   Collection Time: 02/19/20 11:07 PM  Result Value  Ref Range   Glucose-Capillary 142 (H) 70 - 99 mg/dL  Glucose, capillary     Status: Abnormal   Collection Time: 02/20/20  3:21 AM  Result Value Ref Range   Glucose-Capillary 43 (LL) 70 - 99 mg/dL   Comment 1 Notify RN    Comment 2 Document in Chart   Glucose, capillary     Status: Abnormal   Collection Time: 02/20/20  3:38 AM  Result Value Ref Range   Glucose-Capillary 112 (H) 70 - 99 mg/dL  Glucose, capillary     Status: Abnormal   Collection Time: 02/20/20  5:41 AM  Result Value Ref Range   Glucose-Capillary 56 (L) 70 - 99 mg/dL    Assessment & Plan: The plan of care was discussed with the bedside nurse for the day, who is in agreement with this plan and no additional concerns were raised.   Present on Admission: . Middle cerebral artery embolism, right    LOS: 22 days   Additional comments:I reviewed the patient's new clinical lab test results.   and I reviewed the patients new imaging test results.    Dysphagia s/p stroke - plan fro bedisde PEG today. Informed consent obtained from son and daughter 10/27.   Jesusita Oka, MD Trauma & General Surgery Please use [email or web address] to contact on call provider  02/20/2020  *Care during the described time  interval was provided by me. I have reviewed this patient's available data, including medical history, events of note, physical examination and test results as part of my evaluation.

## 2020-02-20 NOTE — Progress Notes (Signed)
Physical Therapy Treatment Patient Details Name: Amber Lopez MRN: [id number] DOB: 08/22/1940 Today's Date: 02/20/2020    History of Present Illness 79 y.o. female with a PMHx of depression, HLD, HIV, HTN and DM presenting with acute onset of left hemiplegia, left facial droop, right eye deviation and aphasia. Pt received IV tPA and underwent R MCA revacularization on 01/29/2020. Pt also found to have large left pleural effusion with near complete L lung collapse and L apical mass. Chest tube placed on 01/31/2020. s/p trach on 02/11/20.     PT Comments    Emphasis on Cervical ROM, tracking, PROM at all major joints L U and LE's, AA/graded resistance to R U and LE's, trunk rotation/stretches.    Follow Up Recommendations  SNF     Equipment Recommendations  Wheelchair (measurements PT);Wheelchair cushion (measurements PT);Hospital bed;3in1 (PT)    Recommendations for Other Services       Precautions / Restrictions Precautions Precautions: Fall Precaution Comments: trach, flexi, watch RR    Mobility  Bed Mobility                  Transfers                    Ambulation/Gait                 Stairs             Wheelchair Mobility    Modified Rankin (Stroke Patients Only) Modified Rankin (Stroke Patients Only) Pre-Morbid Rankin Score: Slight disability Modified Rankin: Severe disability     Balance                                            Cognition Arousal/Alertness: Lethargic;Awake/alert Behavior During Therapy: Flat affect Overall Cognitive Status: Difficult to assess                     Current Attention Level: Sustained   Following Commands: Follows one step commands inconsistently;Follows one step commands with increased time     Problem Solving: Requires tactile cues;Requires verbal cues;Difficulty sequencing;Slow processing        Exercises Other Exercises Other Exercises: warm up ROM and  stretching to all extremities Other Exercises: cervical ROM/stretching Other Exercises: truncal rotation/stretch Other Exercises: R side AA/graded resistance during ROM    General Comments        Pertinent Vitals/Pain Pain Assessment: Faces Faces Pain Scale: Hurts a little bit Pain Location: generzlied with cervical and certain ROM  Pain Descriptors / Indicators: Discomfort;Grimacing Pain Intervention(s): Monitored during session;Limited activity within patient's tolerance    Home Living                      Prior Function            PT Goals (current goals can now be found in the care plan section) Acute Rehab PT Goals PT Goal Formulation: With patient/family Time For Goal Achievement: 02/29/20 Potential to Achieve Goals: Fair Progress towards PT goals: Not progressing toward goals - comment    Frequency    Min 3X/week      PT Plan Current plan remains appropriate    Co-evaluation              AM-PAC PT "6 Clicks" Mobility   Outcome Measure  Help needed turning from your back  to your side while in a flat bed without using bedrails?: Total Help needed moving from lying on your back to sitting on the side of a flat bed without using bedrails?: Total Help needed moving to and from a bed to a chair (including a wheelchair)?: Total Help needed standing up from a chair using your arms (e.g., wheelchair or bedside chair)?: Total Help needed to walk in hospital room?: Total Help needed climbing 3-5 steps with a railing? : Total 6 Click Score: 6    End of Session Equipment Utilized During Treatment: Oxygen Activity Tolerance: Patient limited by fatigue Patient left: in bed;with call bell/phone within reach Nurse Communication: Mobility status PT Visit Diagnosis: Other abnormalities of gait and mobility (R26.89);Muscle weakness (generalized) (M62.81);Hemiplegia and hemiparesis;Other symptoms and signs involving the nervous system (R29.898) Hemiplegia -  Right/Left: Left Hemiplegia - dominant/non-dominant: Non-dominant Hemiplegia - caused by: Cerebral infarction     Time: 1325-1344 PT Time Calculation (min) (ACUTE ONLY): 19 min  Charges:  $Therapeutic Activity: 8-22 mins                     02/20/2020  Amber Carne., PT Acute Rehabilitation Services [phone]  (pager) [phone]  (office)   Amber Lopez 02/20/2020, 4:16 PM

## 2020-02-20 NOTE — Progress Notes (Addendum)
NAME:  Amber Lopez, MRN:  [id number], DOB:  18-Jul-1940, LOS: 85 ADMISSION DATE:  01/29/2020, CONSULTATION DATE:  10/7 REFERRING MD:  Estanislado Pandy, CHIEF COMPLAINT:  Left Sided Weakness   Brief History   79 y/o F, with HIV, admitted 10/7 with LVO of R M1 s/p tPA, neuro IR revascularization.  Returned to ICU post procedure on mechanical ventilation. Additional finding of left superior sulcus mass and complex effusion.   Past Medical History  HIV/AIDS, HTN, HLD, DM, Depression , Arthritis, Breast Cancer   Significant Hospital Events   10/07 Admit with L hemiplegia, facial droop, aphasia with right eye deviation 10/11 Episode of worsened R eye deviation with tachycardia and HTN, concern for sz  10/28 Failed PSV due to tachypnea.    Consults:  Neurology, IR  Oncology 10/13 Palliative care 10/13  Procedures:  ETT 10/7 >> 10/20 L Radial ALine 10/7 >>  out R Femoral Sheath 10/7 >> out Trach 10/20 >>  Significant Diagnostic Tests:  10/7 CT Head Code Stroke >> no acute finding, generalized atrophy  10/7 CTA Head/Neck >> emergent LVO at the right M1 segment, no visible embolic source, atherosclerosis without flow limiting stenosis or ulceration of major vessels, left superior sulcus mass with large complex pleural effusion, associated mediastinal adenopathy  10/7 CT ABD w/contrast >> Negative for hematoma 10/7 CT Chest w/contrast >> Persistent large left pleural effusion with near complete left lung collapse. Left apical mass measuring 3.5 x 3.3 cm suspicious for bronchogenic carcinoma 10/11 CT head >> no acute findings 10/11 EEG >> Continuous slow, generalized and maximal right frontotemporal region  Micro Data:  COVID 10/7 >> negative  Influenza A/B 10/7 >> negative Pleural fluid culture>>no growth three days 10/9 BCx2>> 1/2 with gram positives 10/7 MRSA screen>>negative 10/11 BC x 1 (from left PICC) > staph hominis 1/2 10/14 trach asp >> few WBC, rare G+ cocci>> few stenotrophomonas  maltophilia S to levaquin and bactrim  10/14 BC >> negative  Antimicrobials:  Cefazolin 10/7 x1 Dolutegravir/ emtricitabine/ tenofovir 10/8 >> Vanc 10/14 >> 10/20 Cefepime 10/14 > 10/20  Interim history/subjective:  Afebrile  Vent 30%, PEEP 5 Glucose 56-113 I/O 900 ml UOP, -149ml in last 24 hours PEG planned   Objective   Blood pressure (!) 122/55, pulse 98, temperature 98.7 F (37.1 C), temperature source Oral, resp. rate 17, height 4\' 10"  (1.473 m), weight 55.9 kg, SpO2 100 %.    Vent Mode: PRVC FiO2 (%):  [30 %] 30 % Set Rate:  [16 bmp] 16 bmp Vt Set:  [320 mL] 320 mL PEEP:  [5 cmH20] 5 cmH20 Plateau Pressure:  [14 cmH20-22 cmH20] 15 cmH20   Intake/Output Summary (Last 24 hours) at 02/20/2020 0926 Last data filed at 02/20/2020 0500 Gross per 24 hour  Intake 815 ml  Output 1150 ml  Net -335 ml   Filed Weights   02/13/20 0700 02/19/20 0500 02/20/20 0500  Weight: 54.4 kg 56.8 kg 55.9 kg   Examination: General: adult female lying in bed in NAD on vent   HEENT: MM pink/moist, trach midline c/d/i, sutures in place, pupils reactive, anicteric  Neuro: Awake, eyes open, mouths words / appropriate, moves on R, weakness on left but moves upper spontaneously CV: s1s2 rrr, no m/r/g PULM: non-labored on vent, lungs bilaterally clear GI: soft, bsx4 active  Extremities: warm/dry, no edema  Skin: no rashes or lesions  Resolved Hospital Problem list   Staph Hominis Bacteremia   Assessment & Plan:   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 Neurology signed off 10/26  -continue secondary stroke management -treat HTN   Acute respiratory failure with hypoxemia, failure to wean  Inability to clear secretions -PRVC 8cc/kg as rest mode  -daily SBT trial  -D20 on vent  -clip trach sutures  -will need slow wean from vent   Pleural effusion Adenocarcinoma of the left lung with left pleural effusion -follow intermittent CXR  -poor performance status precludes  chemotherapy with metastatic disease   Hypertension -monitor, not on medications at this time   HIV/AIDS -continue atovaquone, dolutegravir, descovy  DM II  -continue levemir 12 units BID  -SSI   At Risk Malnutrition  -PEG planned 10/29  Normocytic Anemia -Trend CBC / monitor for bleeding   GOC  Difficult situation.  Family has requested transfer to another facility when informed poor prognosis.  Prior discussion with [facility] and no beds available.  LTAC investigated but family declined.  -Daughter refused [facility]. [facility]. -Appears that she may need a ventilator medium term and weaning as tolerated -She does not have any acute problems requiring ICU level care -No clear indication for services requiring transfer to a tertiary or quaternary level of care -family goal is for LTAC placement > rehab > outpatient cancer treatment  Family meeting 10/27 -We will continue current lines of care -Family aware that we are still trying to wean her as tolerated -Agreeable to have PEG placed 10/29   Best practice:  Diet: TF Pain/Anxiety/Delirium protocol (if indicated): as needed VAP protocol (if indicated): yes DVT prophylaxis: lovenox GI prophylaxis: Pantoprazole   Glucose control: SSI Mobility: bed rest Code Status: DNR Family Communication: Discussed with daughter and son 10/27.  Will update family on arrival.  Disposition: LTAC-if we can find a bed  Labs   CBC: Recent Labs  Lab 02/14/20 0413 02/15/20 1630 02/16/20 0611 02/17/20 1300 02/18/20 0335  WBC 11.5* 9.6 8.7 8.6 7.1  HGB 8.9* 9.2* 9.0* 9.2* 8.1*  HCT 27.8* 29.5* 28.8* 29.2* 26.0*  MCV 93.3 94.9 96.3 94.2 94.5  PLT 529* 640* 626* 618* 574*    Basic Metabolic Panel: Recent Labs  Lab 02/14/20 0413 02/15/20 1630 02/16/20 0611 02/17/20 1300 02/18/20 0335  NA 135 135 135 136 135  K 4.0 4.7 4.5 4.4 4.3  CL 104 101 102 102 102  CO2 25 25 25 26 26   GLUCOSE 150* 182* 194* 107* 210*  BUN 18 19 21 16 20    CREATININE 0.83 0.86 0.90 0.76 0.76  CALCIUM 8.2* 8.3* 8.5* 8.5* 8.2*   GFR: Estimated Creatinine Clearance: 42.2 mL/min (by C-G formula based on SCr of 0.76 mg/dL). Recent Labs  Lab 02/15/20 1630 02/16/20 0611 02/17/20 1300 02/18/20 0335  WBC 9.6 8.7 8.6 7.1    Liver Function Tests: No results for input(s): AST, ALT, ALKPHOS, BILITOT, PROT, ALBUMIN in the last 168 hours. No results for input(s): LIPASE, AMYLASE in the last 168 hours. No results for input(s): AMMONIA in the last 168 hours.  ABG    Component Value Date/Time   PHART 7.419 01/30/2020 1042   PCO2ART 26.5 (L) 01/30/2020 1042   PO2ART 143 (H) 01/30/2020 1042   HCO3 17.3 (L) 01/30/2020 1042   TCO2 18 (L) 01/30/2020 1042   ACIDBASEDEF 7.0 (H) 01/30/2020 1042   O2SAT 99.0 01/30/2020 1042     Coagulation Profile: No results for input(s): INR, PROTIME in the last 168 hours.  Cardiac Enzymes: No results for input(s): CKTOTAL, CKMB, CKMBINDEX, TROPONINI in the last 168 hours.  HbA1C: Hgb A1c MFr Bld  Date/Time Value Ref Range Status  01/29/2020 04:22 PM 6.5 (H) 4.8 - 5.6 % Final    Comment:    (NOTE) Pre diabetes:          5.7%-6.4%  Diabetes:              >6.4%  Glycemic control for   <7.0% adults with diabetes     CBG: Recent Labs  Lab 02/20/20 0321 02/20/20 0338 02/20/20 0541 02/20/20 0805 02/20/20 0901  GLUCAP 43* 112* 56* 60* 113*    CC Time: 30 minutes  Noe Gens, MSN, NP-C, AGACNP-BC [facility] 02/20/2020, 9:54 AM   Please see [email or web address] for pager details.    PCCM:  79 year old female acute embolic CVA right MCA, left cerebellum. Ultimately causing acute respiratory failure inability to wean from ventilator. Left adenocarcinoma of the lung with metastatic disease. Further treatment options declined by oncology due to poor performance status.  Notes and documentation reviewed from Noe Gens, NP as above.  Tracheostomy tube in place, remains on  vent Chronically ill-appearing Heart: Regular rhythm S1-S2 Lungs: Diminished on the left as compared to the right no wheeze  Assessment: Multiple medical comorbidities Acute CVA Chronic respiratory failures with tracheostomy tube in Likely prolonged mechanical vent wean Advanced stage adenocarcinoma of the lung Hypertension HIV Type 2 diabetes Need for enteral access  Plan: Agree with ongoing palliative care involvement. Prognosis for this and recovery is poor. Plan for PEG placement by surgery service.  Garner Nash, DO [facility] 02/20/2020 11:16 AM

## 2020-02-20 NOTE — Progress Notes (Signed)
SLP Cancellation Note  Patient Details Name: Amber Lopez MRN: [id number] DOB: Apr 09, 1941   Cancelled treatment:       Reason Eval/Treat Not Completed: Patient not medically ready. Continue to follow for PMSV readiness.    Amber Lopez, Katherene Ponto 02/20/2020, 7:39 AM

## 2020-02-20 NOTE — Progress Notes (Signed)
Pt placed back on full vent support by Dr Bobbye Morton due to pt having PEG placed.

## 2020-02-21 DIAGNOSIS — C3492 Malignant neoplasm of unspecified part of left bronchus or lung: Secondary | ICD-10-CM | POA: Diagnosis not present

## 2020-02-21 DIAGNOSIS — I63511 Cerebral infarction due to unspecified occlusion or stenosis of right middle cerebral artery: Secondary | ICD-10-CM | POA: Diagnosis not present

## 2020-02-21 DIAGNOSIS — C801 Malignant (primary) neoplasm, unspecified: Secondary | ICD-10-CM | POA: Diagnosis not present

## 2020-02-21 DIAGNOSIS — J9601 Acute respiratory failure with hypoxia: Secondary | ICD-10-CM | POA: Diagnosis not present

## 2020-02-21 LAB — GLUCOSE, CAPILLARY
Glucose-Capillary: 129 mg/dL — ABNORMAL HIGH (ref 70–99)
Glucose-Capillary: 138 mg/dL — ABNORMAL HIGH (ref 70–99)
Glucose-Capillary: 142 mg/dL — ABNORMAL HIGH (ref 70–99)
Glucose-Capillary: 149 mg/dL — ABNORMAL HIGH (ref 70–99)
Glucose-Capillary: 163 mg/dL — ABNORMAL HIGH (ref 70–99)
Glucose-Capillary: 86 mg/dL (ref 70–99)

## 2020-02-21 NOTE — Progress Notes (Signed)
[place] Progress Note Patient Name: HELAINA STEFANO DOB: [birth year] MRN: [id number]   Date of Service  02/21/2020  HPI/Events of Note  Patient was on D 5 % LR @ 75 ml / hour when he was NPO but is now receiving enteral nutrition, blood sugar 201 mg %.  eICU Interventions  D 5 % LR infusion discontinued.        Kerry Kass Tyianna Menefee 02/21/2020, 2:55 AM

## 2020-02-21 NOTE — NC FL2 (Addendum)
[place] LEVEL OF CARE SCREENING TOOL     IDENTIFICATION  [name] Birthdate: 12-03-1940 Sex: female Admission Date (Current Location): 01/29/2020  [facility] and [place] Number:  Herbalist and Address:  [facility] [place], [place] [address], [place], [place] [address]      Provider Number: [id number]  Attending Physician Name and Address:  Garner Nash, DO  Relative Name and Phone Number:       Current Level of Care: Hospital Recommended Level of Care: Vent SNF Prior Approval Number:    Date Approved/Denied:   PASRR Number: Level II - pending  Discharge Plan: SNF    Current Diagnoses: Patient Active Problem List   Diagnosis Date Noted  . Status post tracheostomy ([place])   . Ventilator dependent ([place])   . Endotracheal tube present   . Pleural effusion on left   . Acute respiratory failure with hypoxia ([place])   . Pressure injury of skin 02/07/2020  . Palliative care by specialist   . Infiltrate noted on imaging study   . Adenocarcinoma ([place])   . Malnutrition of moderate degree 02/03/2020  . Adenocarcinoma of left lung ([place])   . Acute right MCA stroke ([place]) 01/29/2020  . Middle cerebral artery embolism, right 01/29/2020  . DNR (do not resuscitate) discussion 08/12/2019  . HIV disease ([place]) 03/03/2019  . History of breast cancer 03/01/2019  . Dementia with behavioral disturbance ([place]) 03/01/2019  . Arthritis 03/01/2019  . Essential hypertension 03/01/2019    Orientation RESPIRATION BLADDER Height & Weight      (Patient currently trached, mouthing responses but orientation is unclear)  Tracheostomy Incontinent, External catheter Weight: 123 lb 3.8 oz (55.9 kg) Height:  4' 10"  (147.3 cm)  BEHAVIORAL SYMPTOMS/MOOD NEUROLOGICAL BOWEL NUTRITION STATUS      Incontinent Feeding tube - PEG  AMBULATORY STATUS COMMUNICATION OF NEEDS Skin   Limited Assist Non-Verbally Normal, Other (Comment) (RN states some skin  breakdown near trach site)                       Personal Care Assistance Level of Assistance  Bathing, Feeding, Dressing Bathing Assistance: Maximum assistance Feeding assistance: Maximum assistance (Currently has feeding tube) Dressing Assistance: Maximum assistance     Functional Limitations Info  Sight, Hearing, Speech Sight Info: Adequate Hearing Info: Adequate Speech Info: Impaired    SPECIAL CARE FACTORS FREQUENCY  PT (By licensed PT), OT (By licensed OT)     PT Frequency: 5x weekly OT Frequency: 5x weekly            Contractures Contractures Info: Not present    Additional Factors Info  Allergies, Code Status Code Status Info: DNR Allergies Info: Sulfa Antibiotics           Current Medications (02/21/2020):  This is the current hospital active medication list Current Facility-Administered Medications  Medication Dose Route Frequency Provider Last Rate Last Admin  . 0.9 %  sodium chloride infusion  250 mL Intravenous Continuous Ollis, Brandi L, NP 10 mL/hr at 02/11/20 2212 250 mL at 02/11/20 2212  . 0.9 %  sodium chloride infusion   Intra-arterial PRN Gleason, Otilio Carpen, PA-C   Paused at 02/09/20 0556  . acetaminophen (TYLENOL) tablet 650 mg  650 mg Oral Q4H PRN Kerney Elbe, MD   650 mg at 02/07/20 2103   Or  . acetaminophen (TYLENOL) 160 MG/5ML solution 650 mg  650 mg Per Tube Q4H PRN  Kerney Elbe, MD   650 mg at 02/20/20 1713   Or  . acetaminophen (TYLENOL) suppository 650 mg  650 mg Rectal Q4H PRN Kerney Elbe, MD      . aspirin chewable tablet 81 mg  81 mg Per Tube Daily Rosalin Hawking, MD   81 mg at 02/21/20 0930  . atovaquone (MEPRON) 750 MG/5ML suspension 1,500 mg  1,500 mg Per Tube Q breakfast Audria Nine, DO   1,500 mg at 02/21/20 8871  . chlorhexidine gluconate (MEDLINE KIT) (PERIDEX) 0.12 % solution 15 mL  15 mL Mouth Rinse BID Ollis, Brandi L, NP   15 mL at 02/20/20 2023  . Chlorhexidine Gluconate Cloth 2 % PADS 6 each  6 each Topical  Daily Kerney Elbe, MD   6 each at 02/21/20 1020  . docusate (COLACE) 50 MG/5ML liquid 100 mg  100 mg Per Tube BID Rosalin Hawking, MD   100 mg at 02/19/20 2143  . dolutegravir (TIVICAY) tablet 50 mg  50 mg Oral Daily Garvin Fila, MD   50 mg at 02/21/20 0930  . emtricitabine-tenofovir AF (DESCOVY) 200-25 MG per tablet 1 tablet  1 tablet Per Tube Daily Jose Persia, MD   1 tablet at 02/21/20 0930  . enoxaparin (LOVENOX) injection 40 mg  40 mg Subcutaneous Q24H Simonne Maffucci B, MD   40 mg at 02/21/20 0929  . feeding supplement (OSMOLITE 1.2 CAL) liquid 1,000 mL  1,000 mL Per Tube Continuous Olalere, Adewale A, MD 60 mL/hr at 02/20/20 1713 1,000 mL at 02/20/20 1713  . fiber (NUTRISOURCE FIBER) 1 packet  1 packet Per Tube BID Olalere, Adewale A, MD   1 packet at 02/21/20 0930  . influenza vaccine adjuvanted (FLUAD) injection 0.5 mL  0.5 mL Intramuscular Tomorrow-1000 Sampson Goon, MD      . insulin aspart (novoLOG) injection 0-15 Units  0-15 Units Subcutaneous Q4H Gleason, Otilio Carpen, PA-C   2 Units at 02/21/20 1138  . insulin detemir (LEVEMIR) injection 12 Units  12 Units Subcutaneous BID [name], NP   12 Units at 02/21/20 0930  . labetalol (NORMODYNE) injection 5 mg  5 mg Intravenous Q2H PRN Sampson Goon, MD      . MEDLINE mouth rinse  15 mL Mouth Rinse 10 times per day Noe Gens L, NP   15 mL at 02/21/20 1139  . oxyCODONE (ROXICODONE) 5 MG/5ML solution 5 mg  5 mg Per Tube Q6H PRN Olalere, Adewale A, MD   5 mg at 02/21/20 0527  . pantoprazole sodium (PROTONIX) 40 mg/20 mL oral suspension 40 mg  40 mg Per Tube QHS Garvin Fila, MD   40 mg at 02/20/20 2128  . polyethylene glycol (MIRALAX / GLYCOLAX) packet 17 g  17 g Per Tube Daily Rosalin Hawking, MD   17 g at 02/16/20 1025  . rosuvastatin (CRESTOR) tablet 10 mg  10 mg Per NG tube Daily Sampson Goon, MD   10 mg at 02/21/20 0929  . senna-docusate (Senokot-S) tablet 1 tablet  1 tablet Oral QHS PRN Kerney Elbe, MD          Discharge Medications: Please see discharge summary for a list of discharge medications.  Relevant Imaging Results:  Relevant Lab Results:   Additional Information SSN: [id number]  Archie Endo, LCSW

## 2020-02-21 NOTE — Progress Notes (Signed)
Pt placed on PSV 10/5 and is tolerating well at this time. RN aware.

## 2020-02-21 NOTE — Progress Notes (Addendum)
NAME:  Amber Lopez, MRN:  [id number], DOB:  02/04/1941, LOS: 23 ADMISSION DATE:  01/29/2020, CONSULTATION DATE:  10/7 REFERRING MD:  Estanislado Pandy, CHIEF COMPLAINT:  Left Sided Weakness   Brief History   79 y/o F, with HIV, admitted 10/7 with LVO of R M1 s/p tPA, neuro IR revascularization.  Returned to ICU post procedure on mechanical ventilation. Additional finding of left superior sulcus mass and complex effusion.   Past Medical History  HIV/AIDS, HTN, HLD, DM, Depression , Arthritis, Breast Cancer   Significant Hospital Events   10/07 Admit with L hemiplegia, facial droop, aphasia with right eye deviation 10/11 Episode of worsened R eye deviation with tachycardia and HTN, concern for sz  10/20 trach placed 10/28 Failed PSV due to tachypnea.   10/29 PEG placed  Consults:  Neurology, IR  Oncology 10/13 Palliative care 10/13  Procedures:  ETT 10/7 >> 10/20 L Radial ALine 10/7 >>  out R Femoral Sheath 10/7 >> out Trach 10/20 >>  Significant Diagnostic Tests:  10/7 CT Head Code Stroke >> no acute finding, generalized atrophy  10/7 CTA Head/Neck >> emergent LVO at the right M1 segment, no visible embolic source, atherosclerosis without flow limiting stenosis or ulceration of major vessels, left superior sulcus mass with large complex pleural effusion, associated mediastinal adenopathy  10/7 CT ABD w/contrast >> Negative for hematoma 10/7 CT Chest w/contrast >> Persistent large left pleural effusion with near complete left lung collapse. Left apical mass measuring 3.5 x 3.3 cm suspicious for bronchogenic carcinoma 10/11 CT head >> no acute findings 10/11 EEG >> Continuous slow, generalized and maximal right frontotemporal region  Micro Data:  COVID 10/7 >> negative  Influenza A/B 10/7 >> negative Pleural fluid culture>>no growth three days 10/9 BCx2>> 1/2 with gram positives 10/7 MRSA screen>>negative 10/11 BC x 1 (from left PICC) > staph hominis 1/2 10/14 trach asp >> few WBC,  rare G+ cocci>> few stenotrophomonas maltophilia S to levaquin and bactrim  10/14 BC >> negative  Antimicrobials:  Cefazolin 10/7 x1 Dolutegravir/ emtricitabine/ tenofovir 10/8 >> Vanc 10/14 >> 10/20 Cefepime 10/14 > 10/20  Interim history/subjective:  No issues over night   Objective   Blood pressure (Abnormal) 141/99, pulse 94, temperature 99.5 F (37.5 C), temperature source Oral, resp. rate 18, height 4\' 10"  (1.473 m), weight 55.9 kg, SpO2 100 %.    Vent Mode: PSV;CPAP FiO2 (%):  [30 %] 30 % Set Rate:  [16 bmp] 16 bmp Vt Set:  [320 mL] 320 mL PEEP:  [5 cmH20] 5 cmH20 Pressure Support:  [10 TGY56-38 cmH20] 10 cmH20 Plateau Pressure:  [18 cmH20-19 cmH20] 19 cmH20   Intake/Output Summary (Last 24 hours) at 02/21/2020 0810 Last data filed at 02/21/2020 0600 Gross per 24 hour  Intake 1507.4 ml  Output 1300 ml  Net 207.4 ml   Filed Weights   02/13/20 0700 02/19/20 0500 02/20/20 0500  Weight: 54.4 kg 56.8 kg 55.9 kg   Examination: General this is a 79 yo female resting in bed. She is in no distress on PSV HENT NCAT no JVD 6 cuffed trach is unremarkable Pulm scattered rhonchi. Now on PSV of 10/peep 5 VTs high 300s to low 400s and no accessory use  Card rrr abd soft not tender PEG looks good Neuro awake. Interactive but seems to neglect the left. Will intermittently follow commands on right  GU cl yellow   Resolved Hospital Problem list   Staph Hominis Bacteremia   Assessment & Plan:   Acute  embolic CVA in the right MCA and left cerebellum Neurology signed off 10/26  Plan Cont secondary stroke management   Acute respiratory failure with hypoxemia, failure to wean  Inability to clear secretions -now s/p trach looks comfortable on PSV Plan Cont daily assessment for SBT and cycle PSV as tolerated Rest on 8cc/kg/pbw Will need LTAC PAD protocol RASS 0 VAP bundle   Adenocarcinoma of the left lung with left pleural effusion  -poor performance status precludes  chemotherapy with metastatic disease  Plan Intermittent cxr   Hypertension Plan Monitor    HIV/AIDS Plan Cont atovaquone, dolutegravir and descovy    DM II ->glucose w/in goal  Plan Cont levemir and ssi    At Risk Malnutrition PEG placed 10/29 Plan Cont tube feeds  Normocytic Anemia -No current evidence of bleeding Plan Cont to trend cbc  GOC  Difficult situacontinue to trend her CBCtion.  Family has requested transfer to another facility when informed poor prognosis.  Prior discussion with [facility] and no beds available.  LTAC investigated but family declined.  -Daughter refused [facility]. [facility] declined. -She does not have any acute problems requiring ICU level care -No clear indication for services requiring transfer to a tertiary or quaternary level of care -family goal is for LTAC placement > rehab > outpatient cancer treatment  Family meeting 10/27   Best practice:  Diet: TF Pain/Anxiety/Delirium protocol (if indicated): as needed VAP protocol (if indicated): yes DVT prophylaxis: lovenox GI prophylaxis: Pantoprazole   Glucose control: SSI Mobility: bed rest Code Status: DNR Family Communication: Discussed with daughter and son 10/27.  Will update family on arrival.  Disposition: LTAC-if we can find a bed  cct 32 min   Erick Colace ACNP-BC [place] Pager # [phone] OR # [phone] if no answer  Critical care attending attestation note:  Patient seen and examined and relevant ancillary tests reviewed.  I agree with the assessment and plan of care as outlined by Isabell Jarvis, NP. This patient was not seen as a shared visit. The following reflects my independent critical care time.  79 y/o F, with HIV, admitted 10/7 with LVO of R M1 s/p tPA, neuro IR revascularization. Returned to ICU post procedure on mechanical ventilation. Additional finding of left superior sulcus mass and complex effusion.   NAEON. On TC at time of evaluation.  Minimally interactive. Moving R extremities spontaneously, not reliably following commands.   Synopsis of assessment and plan:  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 --Cont secondary stroke management   Acute respiratory failure with hypoxemia, failure to wean: Due to stroke, inability to handle secretions. S/p trach. --TC trials and PSV during day, full vent support at night  HIV/AIDS --Cont atovaquone, dolutegravir and descovy   Adenocarcinoma of the left lung with metastatic left pleural effusion  --Not on therapy, poor performance status  CRITICAL CARE Performed by: Lanier Clam   Total critical care time: 31 minutes  Critical care time was exclusive of separately billable procedures and treating other patients.  Critical care was necessary to treat or prevent imminent or life-threatening deterioration.  Critical care was time spent personally by me on the following activities: development of treatment plan with patient and/or surrogate as well as nursing, discussions with consultants, evaluation of patient's response to treatment, examination of patient, obtaining history from patient or surrogate, ordering and performing treatments and interventions, ordering and review of laboratory studies, ordering and review of radiographic studies, pulse oximetry, re-evaluation of patient's condition  and participation in multidisciplinary rounds.  Larey Days, MD Pulmonary/ICU Physician [place]  Pager: [phone]  02/21/2020, 2:32 PM

## 2020-02-21 NOTE — Progress Notes (Addendum)
2pm: CSW completed patient's FL2 and PASSR screening. PASSR screening is under review as a level 2 - documentation was faxed for review.  12:30pm: CSW attempted to reach patient's daughter without success, a voicemail was left requesting a return call.  CSW spoke with Dr. Ree Kida regarding this patient.  CSW attempted to reach [place] with [facility] to discuss this patient - a voicemail was left requesting a return call.  Madilyn Fireman, MSW, LCSW-A Transitions of Care  Clinical Social Worker  [facility] Emergency Departments  Medical ICU [phone]

## 2020-02-21 NOTE — Progress Notes (Signed)
PEG placement 02/20/2020 - tube in good position, 4 at the skin. Abdominal binder in place at all times. If tube is accidentally removed before 02/28/2020, please call the surgeon on call immediately as this is a surgical emergency. Okay to use tube for meds, water flushes, and tube feeds without restriction from surgical perspective, however defer decision-making to primary team. Please call for any additional questions/concerns.    Jesusita Oka, MD General and [facility]

## 2020-02-21 NOTE — Progress Notes (Signed)
Pt placed on 40% ATC per CCM request and pt is tolerating well at this time. RN aware. RT to continue to monitor.

## 2020-02-22 ENCOUNTER — Encounter (HOSPITAL_COMMUNITY): Payer: Self-pay | Admitting: Surgery

## 2020-02-22 DIAGNOSIS — Z7189 Other specified counseling: Secondary | ICD-10-CM | POA: Diagnosis not present

## 2020-02-22 DIAGNOSIS — J9601 Acute respiratory failure with hypoxia: Secondary | ICD-10-CM | POA: Diagnosis not present

## 2020-02-22 DIAGNOSIS — C3492 Malignant neoplasm of unspecified part of left bronchus or lung: Secondary | ICD-10-CM | POA: Diagnosis not present

## 2020-02-22 DIAGNOSIS — I63511 Cerebral infarction due to unspecified occlusion or stenosis of right middle cerebral artery: Secondary | ICD-10-CM | POA: Diagnosis not present

## 2020-02-22 LAB — GLUCOSE, CAPILLARY
Glucose-Capillary: 140 mg/dL — ABNORMAL HIGH (ref 70–99)
Glucose-Capillary: 167 mg/dL — ABNORMAL HIGH (ref 70–99)
Glucose-Capillary: 162 mg/dL — ABNORMAL HIGH (ref 70–99)
Glucose-Capillary: 116 mg/dL — ABNORMAL HIGH (ref 70–99)
Glucose-Capillary: 172 mg/dL — ABNORMAL HIGH (ref 70–99)
Glucose-Capillary: 159 mg/dL — ABNORMAL HIGH (ref 70–99)

## 2020-02-22 NOTE — Progress Notes (Signed)
Pt placed on 40% ATC and is tolerating well at this time. RN aware. RT to continue to monitor.

## 2020-02-22 NOTE — Progress Notes (Signed)
NAME:  Amber Lopez, MRN:  [id number], DOB:  1941-02-28, LOS: 24 ADMISSION DATE:  01/29/2020, CONSULTATION DATE:  10/7 REFERRING MD:  Estanislado Pandy, CHIEF COMPLAINT:  Left Sided Weakness   Brief History   79 y/o F, with HIV, admitted 10/7 with LVO of R M1 s/p tPA, neuro IR revascularization.  Returned to ICU post procedure on mechanical ventilation. Additional finding of left superior sulcus mass and complex effusion.   Past Medical History  HIV/AIDS, HTN, HLD, DM, Depression , Arthritis, Breast Cancer   Significant Hospital Events   10/07 Admit with L hemiplegia, facial droop, aphasia with right eye deviation 10/11 Episode of worsened R eye deviation with tachycardia and HTN, concern for sz  10/20 trach placed 10/28 Failed PSV due to tachypnea.   10/29 PEG placed 10/30-10/31: placed on atc tolerated well.  Consults:  Neurology, IR  Oncology 10/13 Palliative care 10/13  Procedures:  ETT 10/7 >> 10/20 L Radial ALine 10/7 >>  out R Femoral Sheath 10/7 >> out Trach 10/20 >>  Significant Diagnostic Tests:  10/7 CT Head Code Stroke >> no acute finding, generalized atrophy  10/7 CTA Head/Neck >> emergent LVO at the right M1 segment, no visible embolic source, atherosclerosis without flow limiting stenosis or ulceration of major vessels, left superior sulcus mass with large complex pleural effusion, associated mediastinal adenopathy  10/7 CT ABD w/contrast >> Negative for hematoma 10/7 CT Chest w/contrast >> Persistent large left pleural effusion with near complete left lung collapse. Left apical mass measuring 3.5 x 3.3 cm suspicious for bronchogenic carcinoma 10/11 CT head >> no acute findings 10/11 EEG >> Continuous slow, generalized and maximal right frontotemporal region  Micro Data:  COVID 10/7 >> negative  Influenza A/B 10/7 >> negative Pleural fluid culture>>no growth three days 10/9 BCx2>> 1/2 with gram positives 10/7 MRSA screen>>negative 10/11 BC x 1 (from left PICC) >  staph hominis 1/2 10/14 trach asp >> few WBC, rare G+ cocci>> few stenotrophomonas maltophilia S to levaquin and bactrim  10/14 BC >> negative  Antimicrobials:  Cefazolin 10/7 x1 Dolutegravir/ emtricitabine/ tenofovir 10/8 >> Vanc 10/14 >> 10/20 Cefepime 10/14 > 10/20  Interim history/subjective:  Appears comfortable this morning  Objective   Blood pressure (Abnormal) 151/74, pulse 91, temperature 98.8 F (37.1 C), temperature source Axillary, resp. rate (Abnormal) 22, height 4\' 10"  (1.473 m), weight 55.9 kg, SpO2 100 %.    Vent Mode: PRVC FiO2 (%):  [30 %-40 %] 30 % Set Rate:  [16 bmp] 16 bmp Vt Set:  [320 mL] 320 mL PEEP:  [5 cmH20] 5 cmH20 Pressure Support:  [10 cmH20] 10 cmH20 Plateau Pressure:  [24 cmH20] 24 cmH20   Intake/Output Summary (Last 24 hours) at 02/22/2020 0543 Last data filed at 02/22/2020 0500 Gross per 24 hour  Intake 1380 ml  Output 1050 ml  Net 330 ml   Filed Weights   02/13/20 0700 02/19/20 0500 02/20/20 0500  Weight: 54.4 kg 56.8 kg 55.9 kg   Examination: General: This is a 79 year old female is currently resting on full ventilator support HEENT normocephalic atraumatic tracheostomy unremarkable Pulmonary scattered rhonchi no accessory use Cardiac regular rate and rhythm Abdomen soft nontender Skin is warm dry Neuro awake, moving right arm spontaneously left arm contracted seems to have left-sided neglect GU clear yellow  Resolved Hospital Problem list   Staph Hominis Bacteremia   Assessment & Plan:   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 Neurology signed off 10/26  Plan Continuing secondary  stroke management  Acute respiratory failure with hypoxemia, failure to wean  Inability to clear secretions -now s/p trach looks comfortable on PSV Plan We will try to push aerosol trach collar as long as tolerated Keep ventilator at bedside VAP bundle Minimize sedation  Adenocarcinoma of the left lung with left pleural  effusion  -poor performance status precludes chemotherapy with metastatic disease  Plan Intermittent chest x-ray  Hypertension Plan Monitor   HIV/AIDS Plan Continue home medications including: atovaquone, dolutegravir and descovy   DM II ->glucose w/in goal  Plan Continue current Levemir and sliding scale dosing   At Risk Malnutrition PEG placed 10/29 Plan Continue tube feeds  Normocytic Anemia -No current evidence of bleeding Plan Trend CBC  GOC  Difficult situacontinue to trend her CBCtion.  Family has requested transfer to another facility when informed poor prognosis.  Prior discussion with [facility] and no beds available.  LTAC investigated but family declined.  -Daughter refused [facility]. [facility] declined. -She does not have any acute problems requiring ICU level care -No clear indication for services requiring transfer to a tertiary or quaternary level of care -family goal is for LTAC placement > rehab > outpatient cancer treatment  Family meeting 10/27   Best practice:  Diet: TF Pain/Anxiety/Delirium protocol (if indicated): as needed VAP protocol (if indicated): yes DVT prophylaxis: lovenox GI prophylaxis: Pantoprazole   Glucose control: SSI Mobility: bed rest Code Status: DNR Family Communication: Discussed with daughter and son 10/27.  Will update family on arrival.  Disposition: LTAC-if we can find a bed  Critical care time 32 minutes  Erick Colace ACNP-BC [place] Pager # [phone] OR # [phone] if no answer

## 2020-02-23 DIAGNOSIS — I63511 Cerebral infarction due to unspecified occlusion or stenosis of right middle cerebral artery: Secondary | ICD-10-CM | POA: Diagnosis not present

## 2020-02-23 LAB — GLUCOSE, CAPILLARY
Glucose-Capillary: 111 mg/dL — ABNORMAL HIGH (ref 70–99)
Glucose-Capillary: 193 mg/dL — ABNORMAL HIGH (ref 70–99)
Glucose-Capillary: 181 mg/dL — ABNORMAL HIGH (ref 70–99)
Glucose-Capillary: 117 mg/dL — ABNORMAL HIGH (ref 70–99)
Glucose-Capillary: 108 mg/dL — ABNORMAL HIGH (ref 70–99)
Glucose-Capillary: 175 mg/dL — ABNORMAL HIGH (ref 70–99)

## 2020-02-23 MED ORDER — INSULIN DETEMIR 100 UNIT/ML ~~LOC~~ SOLN
15.0000 [IU] | Freq: Two times a day (BID) | SUBCUTANEOUS | Status: DC
  Administered 2020-02-23 – 2020-03-13 (×38): 15 [IU] via SUBCUTANEOUS
  Filled 2020-02-23 (×43): qty 0.15

## 2020-02-23 MED ORDER — ORAL CARE MOUTH RINSE
15.0000 mL | Freq: Two times a day (BID) | OROMUCOSAL | Status: DC
  Administered 2020-02-24 – 2020-03-21 (×53): 15 mL via OROMUCOSAL

## 2020-02-23 MED ORDER — CHLORHEXIDINE GLUCONATE 0.12 % MT SOLN
15.0000 mL | Freq: Two times a day (BID) | OROMUCOSAL | Status: DC
  Administered 2020-02-23 – 2020-03-22 (×55): 15 mL via OROMUCOSAL
  Filled 2020-02-23 (×52): qty 15

## 2020-02-23 NOTE — Evaluation (Signed)
[facility] - Evaluation Patient Details  Name: Amber Lopez MRN: [id number] Date of Birth: [birth year]  Today's Date: 02/23/2020 Time: 0910-0925 SLP Time Calculation (min) (ACUTE ONLY): 15 min  Past Medical History:  Past Medical History:  Diagnosis Date  . Arthritis   . Depression   . Diabetes mellitus without complication ([place])   . Eating disorder   . Hyperlipidemia   . Hypertension   . Migraines    Past Surgical History:  Past Surgical History:  Procedure Laterality Date  . BREAST BIOPSY    . BREAST LUMPECTOMY Right    Years ago / Pt's daughter thinks it was cancer  . IR CT HEAD LTD  01/29/2020  . IR PERCUTANEOUS ART THROMBECTOMY/INFUSION INTRACRANIAL INC DIAG ANGIO  01/29/2020  . PEG PLACEMENT N/A 02/20/2020   Procedure: PERCUTANEOUS ENDOSCOPIC GASTROSTOMY (PEG) PLACEMENT;  Surgeon: Jesusita Oka, MD;  Location: [place];  Service: General;  Laterality: N/A;  . RADIOLOGY WITH ANESTHESIA N/A 01/29/2020   Procedure: IR WITH ANESTHESIA - CODE STROKE;  Surgeon: Radiologist, Medication, MD;  Location: [place];  Service: Radiology;  Laterality: N/A;   HPI:  Patient is a 79 y.o. female with PMH: HIV, depression, DM, HTN, HLD, arthritis, breast cancer, who was admitted on 10/7, who presented with acute onset of left hemiplegia, left facial droop, right eye deviation and aphasia. MR Brain revealed multifocal infarct in the right MCA territory affecting cortex and striatum, mild petechial hemorrhage as well as small acute left cerebellar infarct and generalized brain atrophy. She was intubated from 10/7 through 10/20 when trach was placed. PEG placed on 10/29.  Most recent CXR on 10/28 revealed persistent left base collapse/consolidation with small left pleural effusion. Patient has a Therapist, sports, cuffed and is tolerating trach collar at 8 Liters, 35 FiO2.   Assessment / Plan / Recommendation Clinical Impression  Patient seen for PMV evaluation. RN reported that  patient has had instances of buildup of oral secretions. RN performed tracheal suctioning while SLP deflated cuff. Patient with minor increase in RR during suctioning and initial placement of PMV, however this returned to her baseline of RR range 19-22 and SpO2 remained at 100% for the duration of the evaluation. Initially, PMV was placed for several trials of 1-2 breath cycles and when removed, no air trapping was observed. Patient able to achieve voicing, which was clear but low vocal intensity. She responded to questions to say her name and was able to speak at short phrase level and did not display any overt s/s respiratory distress; no increased WOB and with vitals maintained within baseline ranges. Patient exhibited some throat clearing and weak coughing but did not demonstrate ability to expectorate secretions. NP arrived during evaluation and gave SLP and RN permission to keep cuff deflated. Recommend that patient wear PMV with full supervision from nursing staff and therapy as frequently as able.   SLP Visit Diagnosis: Aphonia (R49.1)    SLP Assessment  Patient needs continued Speech Lanaguage Pathology Services    Follow Up Recommendations  Other (comment) (TBD)    Frequency and Duration min 2x/week  2 weeks    PMSV Trial PMSV was placed for: total of 20 minutes Able to redirect subglottic air through upper airway: Yes Able to Attain Phonation: Yes Voice Quality: Normal;Low vocal intensity Able to Expectorate Secretions: No Level of Secretion Expectoration with PMSV: Not observed Breath Support for Phonation: Adequate Intelligibility: Intelligible Respirations During Trial: 21 SpO2 During Trial: 100 % Pulse During Trial:  90 Behavior: Alert;Responsive to questions   Tracheostomy Tube    Cuffed #6 Legacy   Vent Dependency  FiO2 (%): 35 %    Cuff Deflation Trial  GO Tolerated Cuff Deflation: Yes Length of Time for Cuff Deflation Trial: 20 minutes Behavior:  Alert;[place], MA, CCC-SLP Speech Therapy

## 2020-02-23 NOTE — Plan of Care (Signed)
  Problem: Clinical Measurements: Goal: Ability to maintain clinical measurements within normal limits will improve Outcome: Progressing Goal: Will remain free from infection Outcome: Progressing Goal: Diagnostic test results will improve Outcome: Progressing Goal: Respiratory complications will improve Outcome: Progressing Goal: Cardiovascular complication will be avoided Outcome: Progressing   Problem: Activity: Goal: Risk for activity intolerance will decrease Outcome: Progressing   Problem: Nutrition: Goal: Adequate nutrition will be maintained Outcome: Progressing   Problem: Coping: Goal: Level of anxiety will decrease Outcome: Progressing   Problem: Elimination: Goal: Will not experience complications related to bowel motility Outcome: Progressing Goal: Will not experience complications related to urinary retention Outcome: Progressing   Problem: Pain Managment: Goal: General experience of comfort will improve Outcome: Progressing   Problem: Safety: Goal: Ability to remain free from injury will improve Outcome: Progressing   Problem: Skin Integrity: Goal: Risk for impaired skin integrity will decrease Outcome: Progressing   Problem: Nutrition: Goal: Risk of aspiration will decrease Outcome: Progressing Goal: Dietary intake will improve Outcome: Progressing   Problem: Ischemic Stroke/TIA Tissue Perfusion: Goal: Complications of ischemic stroke/TIA will be minimized Outcome: Progressing   Problem: Activity: Goal: Ability to tolerate increased activity will improve Outcome: Progressing   Problem: Respiratory: Goal: Patent airway maintenance will improve Outcome: Progressing   Problem: Role Relationship: Goal: Ability to communicate will improve Outcome: Progressing

## 2020-02-23 NOTE — Progress Notes (Signed)
Physical Therapy Treatment Patient Details Name: Amber Lopez MRN: [id number] DOB: 03/22/1941 Today's Date: 02/23/2020    History of Present Illness 79 y.o. female with a PMHx of depression, HLD, HIV, HTN and DM presenting with acute onset of left hemiplegia, left facial droop, right eye deviation and aphasia. Pt received IV tPA and underwent R MCA revacularization on 01/29/2020. Pt also found to have large left pleural effusion with near complete L lung collapse and L apical mass. Chest tube placed on 01/31/2020. s/p trach on 02/11/20.     PT Comments    Pt with increased participation and activity tolerance today. Pt with L sided neglected and noted L hemiparesis. Pt attempting to vocalize with PSMV on and was understandable. Pt resistant to cervical ROM, pt with start of contraction in R cervical rotation and side bending. Attempted standing however pt unable to achieve full upright posture despite maxAX2. Acute PT to cont to follow.    Follow Up Recommendations  SNF     Equipment Recommendations  Wheelchair (measurements PT);Wheelchair cushion (measurements PT);Hospital bed;3in1 (PT)    Recommendations for Other Services       Precautions / Restrictions Precautions Precautions: Fall Precaution Comments: trach and flexi Restrictions Weight Bearing Restrictions: No    Mobility  Bed Mobility Overal bed mobility: Needs Assistance Bed Mobility: Supine to Sit;Sit to Supine     Supine to sit: Total assist;+2 for physical assistance Sit to supine: +2 for physical assistance;Total assist;+2 for safety/equipment   General bed mobility comments: pt with no assist or initiation of transfer, dependent, required verbal and tactile cues  Transfers Overall transfer level: Needs assistance Equipment used:  (2 person stand with gait belt) Transfers: Sit to/from Stand Sit to Stand: Total assist;+2 physical assistance         General transfer comment: attempted to stand, required  tactile cues at posterior hips to promote hip extension, unable to achieve full upright standing, L knee blocked, decreased standing tolerance  Ambulation/Gait             General Gait Details: unable   Stairs             Wheelchair Mobility    Modified Rankin (Stroke Patients Only) Modified Rankin (Stroke Patients Only) Pre-Morbid Rankin Score: Slight disability Modified Rankin: Severe disability     Balance Overall balance assessment: Needs assistance Sitting-balance support: Feet supported;No upper extremity supported Sitting balance-Leahy Scale: Poor Sitting balance - Comments: requiring external assist, sat EOB ~12 min working on upright posture, trying for midline orientation, coming into/out of BOS, improved use of R UE to balance Postural control: Right lateral lean;Posterior lean Standing balance support: Single extremity supported;Bilateral upper extremity supported;During functional activity Standing balance-Leahy Scale: Zero                              Cognition Arousal/Alertness: Awake/alert (was sleeping upon arrival but woke up and participated) Behavior During Therapy: Flat affect Overall Cognitive Status: Impaired/Different from baseline Area of Impairment: Attention;Following commands;Problem solving                   Current Attention Level: Sustained   Following Commands: Follows one step commands inconsistently;Follows one step commands with increased time     Problem Solving: Slow processing;Decreased initiation;Difficulty sequencing;Requires tactile cues;Requires verbal cues General Comments: pt follows commands with R LE but none with L UE or LE, pt with decreased attention span. Pt with some verbalization  with PSMV      Exercises General Exercises - Lower Extremity Long Arc Quad: AROM;Right;10 reps;Seated (L LE PROM, pt with no initiation) Other Exercises Other Exercises: attempted cervical ROM however pt  grimacing and resisting    General Comments General comments (skin integrity, edema, etc.): VSS, flexiseal      Pertinent Vitals/Pain Pain Assessment: Faces Faces Pain Scale: Hurts a little bit Pain Location: generalized with cervical and certain ROM  Pain Descriptors / Indicators: Discomfort;Grimacing Pain Intervention(s): Monitored during session    Home Living                      Prior Function            PT Goals (current goals can now be found in the care plan section) Progress towards PT goals: Progressing toward goals    Frequency    Min 3X/week      PT Plan Current plan remains appropriate    Co-evaluation              AM-PAC PT "6 Clicks" Mobility   Outcome Measure  Help needed turning from your back to your side while in a flat bed without using bedrails?: Total Help needed moving from lying on your back to sitting on the side of a flat bed without using bedrails?: Total Help needed moving to and from a bed to a chair (including a wheelchair)?: Total Help needed standing up from a chair using your arms (e.g., wheelchair or bedside chair)?: Total Help needed to walk in hospital room?: Total Help needed climbing 3-5 steps with a railing? : Total 6 Click Score: 6    End of Session Equipment Utilized During Treatment: Oxygen (via trach) Activity Tolerance: Patient tolerated treatment well Patient left: in bed;with call bell/phone within reach;with bed alarm set Nurse Communication: Mobility status PT Visit Diagnosis: Other abnormalities of gait and mobility (R26.89);Muscle weakness (generalized) (M62.81);Hemiplegia and hemiparesis;Other symptoms and signs involving the nervous system (R29.898) Hemiplegia - Right/Left: Left Hemiplegia - dominant/non-dominant: Non-dominant Hemiplegia - caused by: Cerebral infarction     Time: 0939-1000 PT Time Calculation (min) (ACUTE ONLY): 21 min  Charges:  $Neuromuscular Re-education: 8-22 mins                      Kittie Plater, PT, DPT Acute Rehabilitation Services Pager #: [phone] Office #: [phone]    [name] 02/23/2020, 11:51 AM

## 2020-02-23 NOTE — TOC Progression Note (Signed)
Transition of Care [facility]) - Progression Note    Patient Details  Name: Amber Lopez MRN: [id number] Date of Birth: Mar 05, 1941  [facility]) CM/SW Contact  Joanne Chars, LCSW Phone Number: 02/23/2020, 3:58 PM  Clinical Narrative:   CSW faxed vent SNF referral to [facility], fax [phone].  CSW made three attempts to reach admissions at [facility], message left for [place] [facility] and [place] in admissions in order to find out what is needed to make vent SNF referral there.    Expected Discharge Plan: Long Term Acute Care (LTAC) Barriers to Discharge: Continued Medical Work up  Expected Discharge Plan and Services Expected Discharge Plan: Long Term Acute Care (LTAC)   Discharge Planning Services: CM Consult Post Acute Care Choice: Long Term Acute Care (LTAC) Living arrangements for the past 2 months: Single Family Home                                       Social Determinants of Health (SDOH) Interventions    Readmission Risk Interventions No flowsheet data found.

## 2020-02-23 NOTE — Evaluation (Signed)
Clinical/Bedside Swallow Evaluation Patient Details  Name: Amber Lopez MRN: [id number] Date of Birth: 1940/12/25  Today's Date: 02/23/2020 Time: SLP Start Time (ACUTE ONLY): 4098 SLP Stop Time (ACUTE ONLY): 0940 SLP Time Calculation (min) (ACUTE ONLY): 15 min  Past Medical History:  Past Medical History:  Diagnosis Date  . Arthritis   . Depression   . Diabetes mellitus without complication ([place])   . Eating disorder   . Hyperlipidemia   . Hypertension   . Migraines    Past Surgical History:  Past Surgical History:  Procedure Laterality Date  . BREAST BIOPSY    . BREAST LUMPECTOMY Right    Years ago / Pt's daughter thinks it was cancer  . IR CT HEAD LTD  01/29/2020  . IR PERCUTANEOUS ART THROMBECTOMY/INFUSION INTRACRANIAL INC DIAG ANGIO  01/29/2020  . PEG PLACEMENT N/A 02/20/2020   Procedure: PERCUTANEOUS ENDOSCOPIC GASTROSTOMY (PEG) PLACEMENT;  Surgeon: Jesusita Oka, MD;  Location: [place];  Service: General;  Laterality: N/A;  . RADIOLOGY WITH ANESTHESIA N/A 01/29/2020   Procedure: IR WITH ANESTHESIA - CODE STROKE;  Surgeon: Radiologist, Medication, MD;  Location: [place];  Service: Radiology;  Laterality: N/A;   HPI:  Patient is a 79 y.o. female with PMH: HIV, depression, DM, HTN, HLD, arthritis, breast cancer, who was admitted on 10/7, who presented with acute onset of left hemiplegia, left facial droop, right eye deviation and aphasia. MR Brain revealed multifocal infarct in the right MCA territory affecting cortex and striatum, mild petechial hemorrhage as well as small acute left cerebellar infarct and generalized brain atrophy. She was intubated from 10/7 through 10/20 when trach was placed. PEG placed on 10/29.  Most recent CXR on 10/28 revealed persistent left base collapse/consolidation with small left pleural effusion. Patient has a Therapist, sports, cuffed and is tolerating trach collar at 8 Liters, 35 FiO2.   Assessment / Plan / Recommendation Clinical Impression   Pt actively participated in bedside swallowing assessment with PMV in place.  After several trials, she demonstrated recognition of approaching ice chips, purposeful mastication, and visible swallow response with no overt s/s of aspiration.  Voice remained clear.  Given neuro deficits, length of oral intubation, and presence of new trach, pt is a higher risk for dysphagia with silent aspiration.  Recommend proceeding with MBS next date. D/W RN.   SLP Visit Diagnosis: Dysphagia, oropharyngeal phase (R13.12)    Aspiration Risk    TBA   Diet Recommendation    NPO pending swallow study      Other  Recommendations Oral Care Recommendations: Oral care QID                       Prognosis Prognosis for Safe Diet Advancement: Good      Swallow Study   General Date of Onset: 01/29/20 HPI: Patient is a 79 y.o. female with PMH: HIV, depression, DM, HTN, HLD, arthritis, breast cancer, who was admitted on 10/7, who presented with acute onset of left hemiplegia, left facial droop, right eye deviation and aphasia. MR Brain revealed multifocal infarct in the right MCA territory affecting cortex and striatum, mild petechial hemorrhage as well as small acute left cerebellar infarct and generalized brain atrophy. She was intubated from 10/7 through 10/20 when trach was placed. PEG placed on 10/29.  Most recent CXR on 10/28 revealed persistent left base collapse/consolidation with small left pleural effusion. Patient has a Therapist, sports, cuffed and is tolerating trach collar at 8 Liters,  35 FiO2. Type of Study: Bedside Swallow Evaluation Previous Swallow Assessment: no Diet Prior to this Study: NPO;PEG tube Temperature Spikes Noted: No Respiratory Status: Trach;Trach Collar Trach Size and Type: #6;Cuff;Deflated History of Recent Intubation: Yes Length of Intubations (days): 13 days Date extubated:  (trached 10/20) Behavior/Cognition: Alert Oral Cavity Assessment: Other (comment) (abrasion on  tongue) Oral Cavity - Dentition: Adequate natural dentition Patient Positioning: Upright in bed Baseline Vocal Quality: Normal;Low vocal intensity Volitional Cough: Cognitively unable to elicit Volitional Swallow: Unable to elicit    Oral/Motor/Sensory Function     Ice Chips Ice chips: Within functional limits Presentation: Spoon   Thin Liquid Thin Liquid: Not tested    Nectar Thick Nectar Thick Liquid: Not tested   Honey Thick Honey Thick Liquid: Not tested   Puree Puree: Not tested   Solid     Solid: Not tested     Estill Bamberg L. Tivis Ringer, [place] Office number [phone] Pager [phone]  Juan Quam Laurice 02/23/2020,10:13 AM

## 2020-02-23 NOTE — Progress Notes (Signed)
NAME:  Amber Lopez, MRN:  [id number], DOB:  1941-04-16, LOS: 27 ADMISSION DATE:  01/29/2020, CONSULTATION DATE:  10/7 REFERRING MD:  Estanislado Pandy, CHIEF COMPLAINT:  Left Sided Weakness   Brief History   79 y/o F, with HIV, admitted 10/7 with LVO of R M1 s/p tPA, neuro IR revascularization.  Returned to ICU post procedure on mechanical ventilation. Additional finding of left superior sulcus mass and complex effusion.   Past Medical History  HIV/AIDS, HTN, HLD, DM, Depression , Arthritis, Breast Cancer   Significant Hospital Events   10/07 Admit with L hemiplegia, facial droop, aphasia with right eye deviation 10/11 Episode of worsened R eye deviation with tachycardia and HTN, concern for sz  10/20 trach placed 10/28 Failed PSV due to tachypnea.   10/29 PEG placed 10/30-10/31: placed on atc tolerated well.  Consults:  Neurology, IR  Oncology 10/13 Palliative care 10/13  Procedures:  ETT 10/7 >> 10/20 L Radial ALine 10/7 >>  out R Femoral Sheath 10/7 >> out Trach 10/20 >>  Significant Diagnostic Tests:  10/7 CT Head Code Stroke >> no acute finding, generalized atrophy  10/7 CTA Head/Neck >> emergent LVO at the right M1 segment, no visible embolic source, atherosclerosis without flow limiting stenosis or ulceration of major vessels, left superior sulcus mass with large complex pleural effusion, associated mediastinal adenopathy  10/7 CT ABD w/contrast >> Negative for hematoma 10/7 CT Chest w/contrast >> Persistent large left pleural effusion with near complete left lung collapse. Left apical mass measuring 3.5 x 3.3 cm suspicious for bronchogenic carcinoma 10/11 CT head >> no acute findings 10/11 EEG >> Continuous slow, generalized and maximal right frontotemporal region  Micro Data:  COVID 10/7 >> negative  Influenza A/B 10/7 >> negative Pleural fluid culture>>no growth three days 10/9 BCx2>> 1/2 with gram positives 10/7 MRSA screen>>negative 10/11 BC x 1 (from left PICC) >  staph hominis 1/2 10/14 trach asp >> few WBC, rare G+ cocci>> few stenotrophomonas maltophilia S to levaquin and bactrim  10/14 BC >> negative  Antimicrobials:  Cefazolin 10/7 x1 Dolutegravir/ emtricitabine/ tenofovir 10/8 >> Vanc 10/14 >> 10/27 Cefepime 10/14 > 10/20  Interim history/subjective:  No events overnight Remains on ATC since 10/30 am Phonated well this am with SLP on MV- plans for MBS 11/2  Objective   Blood pressure (!) 143/56, pulse 76, temperature 99.2 F (37.3 C), temperature source Axillary, resp. rate 15, height 4\' 10"  (1.473 m), weight 60.1 kg, SpO2 100 %.    FiO2 (%):  [35 %] 35 %   Intake/Output Summary (Last 24 hours) at 02/23/2020 0925 Last data filed at 02/23/2020 0800 Gross per 24 hour  Intake 1510 ml  Output 1950 ml  Net -440 ml   Filed Weights   02/19/20 0500 02/20/20 0500 02/23/20 0500  Weight: 56.8 kg 55.9 kg 60.1 kg   Examination: General:  Chronically ill/ thin elderly female sitting upright in bed in NAD- just finished SLP and now tired HEENT: squints/ unable to assess pupils, midline 6 shiley trach site wnl Neuro: Fatigued appearing, intermittent f/c- tracks, right preference- near neglect, moves RUE/ RLE spont, 2/5 LUE, no movement in LLE CV: NSR, no murmur PULM:  Non labored, scattered rhonchi, strong cough GI: soft, +bs, gtube, purwick, flexiseal Extremities: warm/dry, no LE edema  Skin: no rashes   No labs.  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 Neurology signed off 10/26  Plan Continuing secondary stroke management  Acute respiratory failure with hypoxemia, failure to wean  Inability to clear secretions -now s/p trach looks comfortable on PSV Plan Continue ATC trial Keep ventilator at bedside VAP bundle Aggressive pulmonary hygiene Ongoing SLP efforts- PMV with supervision/  MBS planned for  11/2  Adenocarcinoma of the left lung with left pleural effusion  -poor performance status precludes chemotherapy with metastatic disease  Plan Intermittent chest x-ray  Hypertension Plan Monitor  HIV/AIDS Plan Continue home medications including: atovaquone, dolutegravir and descovy  DM II ->glucose w/in goal  Plan Continue SSI moderate, increase levemir 12->15 units BID   At Risk Malnutrition PEG placed 10/29 Plan Continue tube feeds  Normocytic Anemia -No current evidence of bleeding Plan Trend CBC  GOC  Family has requested transfer to another facility when informed poor prognosis.  Prior discussion with [facility] and no beds available.  LTAC investigated but family declined.  Family meeting 10/27 -D[facility] refused [facility]. [facility] declined. -She does not have any acute problems requiring ICU level care -No clear indication for services requiring transfer to a tertiary or quaternary level of care -family goal is for LTAC placement > rehab > outpatient cancer treatment.  CSW looking at possible [facility] placement again if family agreeable  Best practice:  Diet: TF Pain/Anxiety/Delirium protocol (if indicated): as needed VAP protocol (if indicated): yes DVT prophylaxis: lovenox GI prophylaxis: Pantoprazole   Glucose control: SSI Mobility: bed rest Code Status: DNR Family Communication: pending Disposition: LTAC-if we can find a bed    Kennieth Rad, ACNP [facility] 02/23/2020, 9:25 AM  See Shea Evans for personal pager PCCM on call pager [phone]

## 2020-02-24 ENCOUNTER — Inpatient Hospital Stay (HOSPITAL_COMMUNITY): Payer: Medicare Other

## 2020-02-24 DIAGNOSIS — J9601 Acute respiratory failure with hypoxia: Secondary | ICD-10-CM | POA: Diagnosis not present

## 2020-02-24 DIAGNOSIS — C3492 Malignant neoplasm of unspecified part of left bronchus or lung: Secondary | ICD-10-CM | POA: Diagnosis not present

## 2020-02-24 DIAGNOSIS — I639 Cerebral infarction, unspecified: Secondary | ICD-10-CM | POA: Diagnosis not present

## 2020-02-24 DIAGNOSIS — E440 Moderate protein-calorie malnutrition: Secondary | ICD-10-CM | POA: Diagnosis not present

## 2020-02-24 DIAGNOSIS — Z515 Encounter for palliative care: Secondary | ICD-10-CM | POA: Diagnosis not present

## 2020-02-24 DIAGNOSIS — I63511 Cerebral infarction due to unspecified occlusion or stenosis of right middle cerebral artery: Secondary | ICD-10-CM | POA: Diagnosis not present

## 2020-02-24 LAB — CBC
HCT: 32.3 % — ABNORMAL LOW (ref 36.0–46.0)
Hemoglobin: 9.9 g/dL — ABNORMAL LOW (ref 12.0–15.0)
MCH: 29.1 pg (ref 26.0–34.0)
MCHC: 30.7 g/dL (ref 30.0–36.0)
MCV: 95 fL (ref 80.0–100.0)
Platelets: 646 10*3/uL — ABNORMAL HIGH (ref 150–400)
RBC: 3.4 MIL/uL — ABNORMAL LOW (ref 3.87–5.11)
RDW: 15.4 % (ref 11.5–15.5)
WBC: 10.7 10*3/uL — ABNORMAL HIGH (ref 4.0–10.5)
nRBC: 0 % (ref 0.0–0.2)

## 2020-02-24 LAB — BASIC METABOLIC PANEL
Anion gap: 11 (ref 5–15)
BUN: 15 mg/dL (ref 8–23)
CO2: 30 mmol/L (ref 22–32)
Calcium: 8.9 mg/dL (ref 8.9–10.3)
Chloride: 95 mmol/L — ABNORMAL LOW (ref 98–111)
Creatinine, Ser: 0.89 mg/dL (ref 0.44–1.00)
GFR, Estimated: >60 mL/min (ref >60)
Glucose, Bld: 155 mg/dL — ABNORMAL HIGH (ref 70–99)
Potassium: 4.8 mmol/L (ref 3.5–5.1)
Sodium: 136 mmol/L (ref 135–145)

## 2020-02-24 LAB — GLUCOSE, CAPILLARY
Glucose-Capillary: 149 mg/dL — ABNORMAL HIGH (ref 70–99)
Glucose-Capillary: 112 mg/dL — ABNORMAL HIGH (ref 70–99)
Glucose-Capillary: 123 mg/dL — ABNORMAL HIGH (ref 70–99)
Glucose-Capillary: 92 mg/dL (ref 70–99)
Glucose-Capillary: 193 mg/dL — ABNORMAL HIGH (ref 70–99)

## 2020-02-24 MED ORDER — SENNOSIDES-DOCUSATE SODIUM 8.6-50 MG PO TABS: 1.0000 | ORAL_TABLET | Freq: Every evening | ORAL | Status: DC | PRN

## 2020-02-24 NOTE — Progress Notes (Addendum)
NAME:  Amber Lopez, MRN:  [id number], DOB:  1941/01/20, LOS: 47 ADMISSION DATE:  01/29/2020, CONSULTATION DATE:  10/7 REFERRING MD:  Estanislado Pandy, CHIEF COMPLAINT:  Left Sided Weakness   Brief History   79 y/o F, with HIV, admitted 10/7 with LVO of R M1 s/p tPA, neuro IR revascularization.  Returned to ICU post procedure on mechanical ventilation. Additional finding of left superior sulcus mass and complex effusion.   Past Medical History  HIV/AIDS, HTN, HLD, DM, Depression , Arthritis, Breast Cancer   Significant Hospital Events   10/07 Admit with L hemiplegia, facial droop, aphasia with right eye deviation 10/11 Episode of worsened R eye deviation with tachycardia and HTN, concern for sz  10/20 trach placed 10/28 Failed PSV due to tachypnea.   10/29 PEG placed 10/30-10/31: placed on atc tolerated well.   Consults:  Neurology, IR  Oncology 10/13 Palliative care 10/13  Procedures:  ETT 10/7 >> 10/20 L Radial ALine 10/7 >>  out R Femoral Sheath 10/7 >> out Trach 10/20 >>  Significant Diagnostic Tests:  10/7 CT Head Code Stroke >> no acute finding, generalized atrophy  10/7 CTA Head/Neck >> emergent LVO at the right M1 segment, no visible embolic source, atherosclerosis without flow limiting stenosis or ulceration of major vessels, left superior sulcus mass with large complex pleural effusion, associated mediastinal adenopathy  10/7 CT ABD w/contrast >> Negative for hematoma 10/7 CT Chest w/contrast >> Persistent large left pleural effusion with near complete left lung collapse. Left apical mass measuring 3.5 x 3.3 cm suspicious for bronchogenic carcinoma 10/11 CT head >> no acute findings 10/11 EEG >> Continuous slow, generalized and maximal right frontotemporal region  Micro Data:  COVID 10/7 >> negative  Influenza A/B 10/7 >> negative Pleural fluid culture>>no growth three days 10/9 BCx2>> 1/2 with gram positives 10/7 MRSA screen>>negative 10/11 BC x 1 (from left PICC) >  staph hominis 1/2 10/14 trach asp >> few WBC, rare G+ cocci>> few stenotrophomonas maltophilia S to levaquin and bactrim  10/14 BC >> negative  Antimicrobials:  Cefazolin 10/7 x1 Dolutegravir/ emtricitabine/ tenofovir 10/8 >> Vanc 10/14 >> 10/27 Cefepime 10/14 > 10/20  Interim history/subjective:  Doing well on ATC without overnight events  Objective   Blood pressure (!) 164/62, pulse (!) 102, temperature 98.4 F (36.9 C), temperature source Oral, resp. rate (!) 25, height 4\' 10"  (1.473 m), weight 60.1 kg, SpO2 98 %.    FiO2 (%):  [21 %-28 %] 21 %   Intake/Output Summary (Last 24 hours) at 02/24/2020 0856 Last data filed at 02/24/2020 0800 Gross per 24 hour  Intake 1690 ml  Output 750 ml  Net 940 ml   Filed Weights   02/20/20 0500 02/23/20 0500 02/24/20 0443  Weight: 55.9 kg 60.1 kg 60.1 kg    Examination: General:  Chronically ill-appearing F, sleeping but arousable to voice and in no distress HEENT: squints/ unable to assess pupils, midline 6 shiley trach site wnl Neuro: Fatigued appearing, continue R preference, slight squeeze to command RUE, fatigued and not otherwise following commands CV: NSR, no murmur PULM:  On trach collar 5L with good sats, no rhonchi or wheezes and no distress GI: soft, +bs, no signs of TTP Extremities: warm/dry, no LE edema  Skin: no rashes   Resolved Hospital Problem list   Staph Hominis Bacteremia   Assessment & Plan:    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 Neurology signed off 10/26  P: -Continue secondary stroke management, Asa,  crestor -worked with PT yesterday with slightly more tolerance  Acute respiratory failure with hypoxemia, failure to wean  Inability to clear secretions -now s/p trach, continuing trach collar P: Continue ATC trial Keep ventilator at bedside VAP bundle Aggressive pulmonary hygiene Ongoing SLP efforts- actively participated yesterday with bedside swallowing and vocalization with  supervised PMV,  MBS planned for 11/2  Adenocarcinoma of the left lung with left pleural effusion  -poor performance status precludes chemotherapy with metastatic disease  Plan Intermittent chest x-ray  Hypertension Plan Monitor  HIV/AIDS Plan Continue home medications including: atovaquone, dolutegravir and descovy  DM II ->glucose w/in goal  Plan Continue SSI moderate, increase levemir 12->15 units BID   At Risk Malnutrition PEG placed 10/29 Plan Continue tube feeds  Normocytic Anemia -No current evidence of bleeding Plan Trend CBC  GOC  Family has requested transfer to another facility when informed poor prognosis.  Prior discussion with [facility] and no beds available.  LTAC investigated but family declined.  Family meeting 10/27 -D[facility] refused [facility]. [facility] declined. -She does not have any acute problems requiring ICU level care -No clear indication for services requiring transfer to a tertiary or quaternary level of care -family goal is for LTAC placement > rehab > outpatient cancer treatment.  CSW looking at possible [facility] placement again if family agreeable -11/1 Continued attempts to find placement at [facility] -11/2 Patient has no current critical care needs, plan to transfer to progressive care and hospitalist service, PCCM will continue to follow periodically for trach  Best practice:  Diet: TF Pain/Anxiety/Delirium protocol (if indicated): as needed VAP protocol (if indicated): yes DVT prophylaxis: lovenox GI prophylaxis: Pantoprazole   Glucose control: SSI Mobility: bed rest Code Status: DNR Family Communication: pt's daughter [name] updated Disposition: LTAC-if we can find a bed    CRITICAL CARE Performed by: Otilio Carpen Kristalyn Bergstresser   Total critical care time: 33 minutes  Critical care time was exclusive of separately billable procedures and treating other patients.  Critical care was necessary to treat or prevent imminent or life-threatening  deterioration.  Critical care was time spent personally by me on the following activities: development of treatment plan with patient and/or surrogate as well as nursing, discussions with consultants, evaluation of patient's response to treatment, examination of patient, obtaining history from patient or surrogate, ordering and performing treatments and interventions, ordering and review of laboratory studies, ordering and review of radiographic studies, pulse oximetry and re-evaluation of patient's condition.   Otilio Carpen Yarden Hillis, PA-C [place] [facility]  Pager# [phone], if no answer [phone]

## 2020-02-24 NOTE — Progress Notes (Signed)
Modified Barium Swallow Progress Note  Patient Details  Name: Amber Lopez MRN: [id number] Date of Birth: 08/07/1940  Today's Date: 02/24/2020  Modified Barium Swallow completed.  Full report located under Chart Review in the Imaging Section.  Brief recommendations include the following:  Clinical Impression  Pt presents with a primary oral phase dysphagia, influenced more by her cognitive status than sensorimotor dysfunction.  Positioning for the study was challenging, with pt leaning to the left with head turn, impacting the clarity of the view. She had difficulty using a straw, biting it repetitively.  Thin liquids from the edge of a cup led to left anterior spillage; purees were managed better.  Overall, oral phase was c/b decreased bolus cohesion/control.  Once material reached the valleculae, the pharyngeal swallow triggered and there was consistent laryngeal vestibule closure with no penetration and no aspiration.  Successive boluses of thin liquids were offered, and airway protection was reliable. There was good pharyngeal clearance with no residue post-swallow for any consistencies.  Pt used PMV for duration of study with excellent toleration.  Recommend initiating a dysphagia 1 diet with thin liquids.  Cognition will be primary barrier to adequate oral intake.  D/W RN, who was present for study.      Swallow Evaluation Recommendations       SLP Diet Recommendations: Dysphagia 1 (Puree) solids;Thin liquid - use PMV with all POs   Liquid Administration via: Cup   Medication Administration: Via alternative means   Supervision: Staff to assist with self feeding       Postural Changes: Remain semi-upright after after feeds/meals (Comment)   Oral Care Recommendations: Oral care BID   Other Recommendations: Place PMSV during PO intake   Amber Lopez, [place] Office number [phone] Pager [phone]  Juan Quam  Laurice 02/24/2020,12:40 PM

## 2020-02-24 NOTE — Progress Notes (Signed)
Patient ID: Amber Lopez, female   DOB: 19-Dec-1940, 79 y.o.   MRN: [id number]   This NP reviewed medical records, discussed case with team and then visited patient at the bedside as a follow up for continued assessment for palliative medicine needs and emotional support to family.   Patient is now off ventilator, utilizing passy-muir valve for limited communication and progressing from a diet perspective.   Spoke  with patient's daughter by telephone.  Amber Lopez is very excited about her mother's improvement " I had faith"   Speech is recommending diet progression (see note).    Education offered regarding next steps and transition of care, conversation had regarding LTAC versus SNF.    Therapeutic listening and emotional support.    Discussed with daughter  the importance of continued conversation with her  family and their  medical providers regarding overall plan of care and treatment options,  ensuring decisions are within the context of the patients values and GOCs.  Questions and concerns addressed      Total time spent on the unit was 20 minutes  PMT will continue to support holistically        Discussed with bedside RN  Greater than 50% of the time was spent in counseling and coordination of care  Wadie Lessen NP  Palliative Medicine Team Team Phone

## 2020-02-24 NOTE — Progress Notes (Signed)
Occupational Therapy Treatment Patient Details Name: Amber Lopez MRN: [id number] DOB: 1940/07/02 Today's Date: 02/24/2020    History of present illness 79 y.o. female with a PMHx of depression, HLD, HIV, HTN and DM presenting with acute onset of left hemiplegia, left facial droop, right eye deviation and aphasia. Pt received IV tPA and underwent R MCA revacularization on 01/29/2020. Pt also found to have large left pleural effusion with near complete L lung collapse and L apical mass. Chest tube placed on 01/31/2020. s/p trach on 02/11/20.    OT comments  Pt with slow progress towards OT goals, though is awake and participatory during today's session. Pt vocalizing over trach but difficult to fully understand. Pt with notable stiffness/?contracture within L cervical region. Also noted increased flexor tone in LUE, though during completion of basic ADL pt attempting to incorporate use of LUE and with some activation noted (mostly elbow/shoulder flexors). HR up to 115 bpm with bed level activity. Acute OT goal dates updated as current goals remain appropriate at this time. Will continue per POC.    Follow Up Recommendations  SNF    Equipment Recommendations  None recommended by OT          Precautions / Restrictions Precautions Precautions: Fall Precaution Comments: trach and flexi       Mobility Bed Mobility Overal bed mobility: Needs Assistance             General bed mobility comments: pt attempting to use RUE to assist with repositioning at bed level, overall requirign totalA                       ADL either performed or assessed with clinical judgement   ADL Overall ADL's : Needs assistance/impaired     Grooming: Moderate assistance;Maximal assistance;Bed level;Wash/dry face Grooming Details (indicate cue type and reason): using RUE with hand over hand to intiiate, pt initiating attempting to use LUE therefore providing hand over hand with LUE to incorporate  into task as well                                      Cognition Arousal/Alertness: Awake/alert Behavior During Therapy: Flat affect Overall Cognitive Status: Impaired/Different from baseline Area of Impairment: Attention;Following commands;Problem solving                   Current Attention Level: Sustained   Following Commands: Follows one step commands inconsistently;Follows one step commands with increased time     Problem Solving: Slow processing;Decreased initiation;Difficulty sequencing;Requires tactile cues;Requires verbal cues General Comments: attempting to voice needs/thoughts durign session        Exercises Exercises: Other exercises;General Upper Extremity General Exercises - Upper Extremity Shoulder Flexion: AAROM;Right;5 reps Wrist Flexion: PROM;Left;5 reps (stretching) Wrist Extension: PROM;Left;5 reps (stretching) Digit Composite Flexion: PROM;Left Composite Extension: PROM;Left Other Exercises Other Exercises: attempting cervical stretching as pt with L lateral flexion/contracture Other Exercises: gentle prolonged stretch to LUE into elbow extension as pt tending to maintain L elbow flexion at rest    Shoulder Instructions       General Comments      Pertinent Vitals/ Pain       Pain Assessment: Faces Faces Pain Scale: Hurts a little bit Pain Location: generalized with cervical and certain ROM  Pain Descriptors / Indicators: Discomfort;Grimacing Pain Intervention(s): Repositioned;Monitored during session  Home Living  Prior Functioning/Environment              Frequency  Min 2X/week        Progress Toward Goals  OT Goals(current goals can now be found in the care plan section)  Progress towards OT goals: Progressing toward goals  Acute Rehab OT Goals Patient Stated Goal: Pt unable to state  OT Goal Formulation: With patient Time For Goal Achievement:  03/09/20 Potential to Achieve Goals: Fair ADL Goals Pt Will Perform Grooming: with min assist;sitting Pt Will Perform Upper Body Bathing: with mod assist;sitting Pt Will Transfer to Toilet: with max assist;with +2 assist;bedside commode Additional ADL Goal #1: Pt will locate ADL items at midline with min cues Additional ADL Goal #2: Family will be independent with PROM/positioning Lt UE  Plan Discharge plan remains appropriate    Co-evaluation                 AM-PAC OT "6 Clicks" Daily Activity     Outcome Measure   Help from another person eating meals?: Total Help from another person taking care of personal grooming?: A Lot Help from another person toileting, which includes using toliet, bedpan, or urinal?: Total Help from another person bathing (including washing, rinsing, drying)?: Total Help from another person to put on and taking off regular upper body clothing?: Total Help from another person to put on and taking off regular lower body clothing?: Total 6 Click Score: 7    End of Session    OT Visit Diagnosis: Hemiplegia and hemiparesis Hemiplegia - Right/Left: Left Hemiplegia - dominant/non-dominant: Non-Dominant Hemiplegia - caused by: Cerebral infarction   Activity Tolerance Patient tolerated treatment well   Patient Left in bed;with call bell/phone within reach;with bed alarm set;with restraints reapplied   Nurse Communication Mobility status        Time: 1726-1740 OT Time Calculation (min): 14 min  Charges: OT General Charges $OT Visit: 1 Visit OT Treatments $Self Care/Home Management : 8-22 mins  Lou Cal, OT Acute Rehabilitation Services Pager [phone] Office [phone]    Raymondo Band 02/24/2020, 6:12 PM

## 2020-02-24 NOTE — TOC Benefit Eligibility Note (Addendum)
Patient Advocate Encounter   I was successful in securing patient a $ 7,500.00 grant from [employer] to provide copayment coverage for Descovy and Tivicay.  The patient's out of pocket cost will be $0.00 monthly.        Member ID: [id number] Group ID: [id number] RxBin: [id number] Dates of Eligibility: 02/24/2020 through 04/23/2020      Lyndel Safe, [place] Certified Pharmacy Technician- Transitions of [place] [facility] Stewardship Team Direct Number: [phone]  Fax: [phone]

## 2020-02-25 DIAGNOSIS — I63511 Cerebral infarction due to unspecified occlusion or stenosis of right middle cerebral artery: Secondary | ICD-10-CM | POA: Diagnosis not present

## 2020-02-25 LAB — GLUCOSE, CAPILLARY
Glucose-Capillary: 121 mg/dL — ABNORMAL HIGH (ref 70–99)
Glucose-Capillary: 127 mg/dL — ABNORMAL HIGH (ref 70–99)
Glucose-Capillary: 128 mg/dL — ABNORMAL HIGH (ref 70–99)
Glucose-Capillary: 139 mg/dL — ABNORMAL HIGH (ref 70–99)
Glucose-Capillary: 143 mg/dL — ABNORMAL HIGH (ref 70–99)
Glucose-Capillary: 172 mg/dL — ABNORMAL HIGH (ref 70–99)
Glucose-Capillary: 178 mg/dL — ABNORMAL HIGH (ref 70–99)
Glucose-Capillary: 185 mg/dL — ABNORMAL HIGH (ref 70–99)
Glucose-Capillary: 190 mg/dL — ABNORMAL HIGH (ref 70–99)

## 2020-02-25 MED ORDER — OSMOLITE 1.2 CAL PO LIQD
1000.0000 mL | ORAL | Status: DC
  Administered 2020-02-25 – 2020-03-02 (×6): 1000 mL
  Filled 2020-02-25 (×7): qty 1000

## 2020-02-25 MED ORDER — ADULT MULTIVITAMIN W/MINERALS CH
1.0000 | ORAL_TABLET | Freq: Every day | ORAL | Status: DC
  Administered 2020-02-25: 1 via ORAL
  Filled 2020-02-25: qty 1

## 2020-02-25 MED ORDER — CARVEDILOL 3.125 MG PO TABS
3.1250 mg | ORAL_TABLET | Freq: Two times a day (BID) | ORAL | Status: DC
  Administered 2020-02-25: 3.125 mg via ORAL
  Filled 2020-02-25: qty 1

## 2020-02-25 MED ORDER — ENSURE ENLIVE PO LIQD
237.0000 mL | Freq: Two times a day (BID) | ORAL | Status: DC
  Administered 2020-02-25 – 2020-03-22 (×40): 237 mL via ORAL
  Filled 2020-02-25 (×8): qty 237

## 2020-02-25 NOTE — Care Management Important Message (Signed)
Important Message  Patient Details  Name: Amber Lopez MRN: [id number] Date of Birth: [birth year]   Medicare Important Message Given:  Yes     Ayumi Wangerin Montine Circle 02/25/2020, 4:23 PM

## 2020-02-25 NOTE — Progress Notes (Signed)
Nutrition Follow-up  DOCUMENTATION CODES:   Non-severe (moderate) malnutrition in context of chronic illness  INTERVENTION:  Given pt's po intake is improving, recommend decreasing TF rate to promote po intake. Via PEG: -Osmolite 1.2 cal @ 86ml/hr  TF regimen will provide 1008 kcals (meets 61% of minimum needs), 46 grams protein, 655ml free water   Also provide pt with: -Ensure Enlive po BID, each supplement provides 350 kcal and 20 grams of protein -MVI daily  NUTRITION DIAGNOSIS:   Moderate Malnutrition related to chronic illness (HIV) as evidenced by mild muscle depletion, moderate muscle depletion, mild fat depletion, moderate fat depletion.  ongoing  GOAL:   Patient will meet greater than or equal to 90% of their needs  progressing  MONITOR:   Vent status, Weight trends, TF tolerance, Skin, Labs  REASON FOR ASSESSMENT:   Consult Enteral/tube feeding initiation and management  ASSESSMENT:   79 yo female presents with left sided weakness, facial droop, aphasia and right eye deviation and  admitted with ischemic stoke insetting of large vessel occlusion of right MCA M1 segment requiring revascularization, acute blood loss anemia with concern of retroperitoneal bleed, respiratory failure requiring intubation. PMH includes HIV, HTN, HLD, DM, depression, hx of breast cancer  10/07 - admitted, intubated, s/p revascularization of R MCA M1 occlusion 10/08 -CT chest with new mass consistent with malignant adenocarcinoma,RI multifocal infarct with mild petechial hemorrhage with small L cerebella infarct 10/09 - chest tube placed for large left pleural effusion 10/19 - GOC discussion, plan for trach/PEG 10/20 - trach, cortrak placed 10/28 failed PSV due to tachypnea 10/29 PEG placed 11/2 MBS, dysphagia 1 diet with thin liquids ordered  Pt pending placement in LTACH.    Pt transferred from ICU to progressive unit yesterday afternoon. CCM following intermittently for  trach care. Pt tolerating ATC. Pt continues to receive TF via PEG; current orders are for Osmolite 1.5 @60ml /hr. Pt did have diet advanced yesterday, but there is no meal documentation available. Discussed pt with RN who reports pt doing well with meals and is consuming 50% or more so far. Will decrease TF rate to stimulate appetite and order oral nutrition supplements. If pt's intake continues to improve, will likely be able to d/c TF.  Admit wt 53.7 kg Current wt: 60.1 kg  UOP: 912ml x24 hours  Labs: CBGs 172-193 Medications: colace, nutrisource fiber, ss novolog, 15 units levemir, protonix, miralax  Diet Order:   Diet Order            DIET - DYS 1 Room service appropriate? No; Fluid consistency: Thin  Diet effective now                 EDUCATION NEEDS:   Not appropriate for education at this time  Skin:  Skin Assessment: Skin Integrity Issues: Skin Integrity Issues:: Stage II Stage II: R perineum  Last BM:  11/1  Height:   Ht Readings from Last 1 Encounters:  01/29/20 4\' 10"  (1.473 m)    Weight:   Wt Readings from Last 1 Encounters:  02/24/20 60.1 kg    BMI:  Body mass index is 27.69 kg/m.  Estimated Nutritional Needs:   Kcal:  1650-1850 kcals  Protein:  75-90 g  Fluid:  >/= 1.7  L    Larkin Ina, MS, RD, LDN RD pager number and weekend/on-call pager number located in [place].

## 2020-02-25 NOTE — Progress Notes (Signed)
Physical Therapy Treatment Patient Details Name: Amber Lopez MRN: [id number] DOB: [birth year] Today's Date: 02/25/2020    History of Present Illness 79 y.o. female with a PMHx of depression, HLD, HIV, HTN and DM presenting with acute onset of left hemiplegia, left facial droop, right eye deviation and aphasia. Pt received IV tPA and underwent R MCA revacularization on 01/29/2020. Pt also found to have large left pleural effusion with near complete L lung collapse and L apical mass. Chest tube placed on 01/31/2020. s/p trach on 02/11/20.     PT Comments    Pt tolerates treatment well despite indications of pain in L forearm and wrist. Pt remains unable to move LUE during this session and is weak in LLE. Pt with significant impairments in functional mobility, requiring maxA to mobilize in the bed, and needing assistance to maintain sitting balance at all times. Pt continues to demonstrate R gaze preference and has a cervical contracture into L lateral flexion and R rotation. Pt is encourage to attempt to look out of door of room to left side to facilitate a gradual return in motion in C-spine. PT continues to recommend SNF placement at this time.  Follow Up Recommendations  SNF     Equipment Recommendations  Wheelchair (measurements PT);Wheelchair cushion (measurements PT);Hospital bed;3in1 (PT)    Recommendations for Other Services       Precautions / Restrictions Precautions Precautions: Fall Precaution Comments: trach and flexi Restrictions Weight Bearing Restrictions: No    Mobility  Bed Mobility Overal bed mobility: Needs Assistance Bed Mobility: Supine to Sit;Sit to Supine     Supine to sit: Max assist;HOB elevated Sit to supine: Max assist;HOB elevated      Transfers                    Ambulation/Gait                 Stairs             Wheelchair Mobility    Modified Rankin (Stroke Patients Only) Modified Rankin (Stroke Patients  Only) Pre-Morbid Rankin Score: Slight disability Modified Rankin: Severe disability     Balance Overall balance assessment: Needs assistance Sitting-balance support: Single extremity supported;Feet supported Sitting balance-Leahy Scale: Poor Sitting balance - Comments: minA for brief periods with RUE support of railing, otherwise mod-maxA due to Left lateral lean Postural control: Left lateral lean                                  Cognition Arousal/Alertness: Awake/alert Behavior During Therapy: Flat affect Overall Cognitive Status: Impaired/Different from baseline Area of Impairment: Attention;Memory;Following commands;Safety/judgement;Awareness;Problem solving                   Current Attention Level: Sustained Memory: Decreased recall of precautions;Decreased short-term memory Following Commands: Follows one step commands with increased time Safety/Judgement: Decreased awareness of safety;Decreased awareness of deficits Awareness: Emergent Problem Solving: Slow processing;Decreased initiation;Requires verbal cues;Requires tactile cues        Exercises Other Exercises Other Exercises: PROM L wrist, digits, elbow, and shoulder flexion and extension for 10 reps    General Comments General comments (skin integrity, edema, etc.): VSS on RA, PMV on      Pertinent Vitals/Pain Pain Assessment: Faces Faces Pain Scale: Hurts even more Pain Location: L wrist/forearm Pain Descriptors / Indicators: Discomfort;Grimacing Pain Intervention(s): Limited activity within patient's tolerance;Monitored during session  Home Living                      Prior Function            PT Goals (current goals can now be found in the care plan section) Acute Rehab PT Goals Patient Stated Goal: to reduce pain Progress towards PT goals: Progressing toward goals    Frequency    Min 3X/week      PT Plan Current plan remains appropriate     Co-evaluation              AM-PAC PT "6 Clicks" Mobility   Outcome Measure  Help needed turning from your back to your side while in a flat bed without using bedrails?: Total Help needed moving from lying on your back to sitting on the side of a flat bed without using bedrails?: Total Help needed moving to and from a bed to a chair (including a wheelchair)?: Total Help needed standing up from a chair using your arms (e.g., wheelchair or bedside chair)?: Total Help needed to walk in hospital room?: Total Help needed climbing 3-5 steps with a railing? : Total 6 Click Score: 6    End of Session   Activity Tolerance: Patient tolerated treatment well Patient left: in bed;with call bell/phone within reach;with bed alarm set;with restraints reapplied Nurse Communication: Mobility status PT Visit Diagnosis: Other abnormalities of gait and mobility (R26.89);Muscle weakness (generalized) (M62.81);Hemiplegia and hemiparesis;Other symptoms and signs involving the nervous system (R29.898) Hemiplegia - Right/Left: Left Hemiplegia - dominant/non-dominant: Non-dominant Hemiplegia - caused by: Cerebral infarction     Time: 5859-2924 PT Time Calculation (min) (ACUTE ONLY): 23 min  Charges:  $Therapeutic Exercise: 8-22 mins $Therapeutic Activity: 8-22 mins                     Zenaida Niece, PT, DPT Acute Rehabilitation Pager: [phone]    Zenaida Niece 02/25/2020, 3:15 PM

## 2020-02-25 NOTE — Progress Notes (Signed)
  Speech Language Pathology Treatment: Dysphagia;[place] Speaking valve  Patient Details Name: Amber Lopez MRN: [id number] DOB: [birth year] Today's Date: 02/25/2020 Time: 2993-7169 SLP Time Calculation (min) (ACUTE ONLY): 20 min  Assessment / Plan / Recommendation Clinical Impression  Pt was seen for PMV and dysphagia treatment. She was lethargic and required max multimodal cueing to become aroused and engaged in the session. During initial placement of PMV, pt's RR remained stable and when removed, no air trapping was observed. Pt tolerated placement of PMV for the remainder of the session without any increased respiratory distress. She was able to achieve clear voicing, but vocal intensity was low, which negatively impacted intelligibility at times. Recommend pt wear PMV with full supervision from staff and therapy as frequently as able. With PMV placed, pt was observed with thin liquid and puree. She demonstrated decreased awareness of POs and mild anterior spillage, but no s/sx of aspiration were observed. SLP increased pt's awareness of feeding by engaging pt in hand-over-hand assistance and providing max verbal, visual, and tactile cues with POs. Further sessions should focus on training staff to feed pt. SLP will continue to f/u acutely.   HPI HPI: Patient is a 79 y.o. female with PMH: HIV, depression, DM, HTN, HLD, arthritis, breast cancer, who was admitted on 10/7, who presented with acute onset of left hemiplegia, left facial droop, right eye deviation and aphasia. MR Brain revealed multifocal infarct in the right MCA territory affecting cortex and striatum, mild petechial hemorrhage as well as small acute left cerebellar infarct and generalized brain atrophy. She was intubated from 10/7 through 10/20 when trach was placed. PEG placed on 10/29.  Most recent CXR on 10/28 revealed persistent left base collapse/consolidation with small left pleural effusion. Patient has a Therapist, sports, cuffed  and is tolerating trach collar at 8 Liters, 35 FiO2.      SLP Plan  Continue with current plan of care       Recommendations  Diet recommendations: Dysphagia 1 (puree);Thin liquid Liquids provided via: Cup;Straw Medication Administration: Via alternative means Supervision: Full supervision/cueing for compensatory strategies;Staff to assist with self feeding Postural Changes and/or Swallow Maneuvers: Seated upright 90 degrees      Patient may use Passy-Muir Speech Valve: Intermittently with supervision;During all therapies with supervision PMSV Supervision: Full MD: Please consider changing trach tube to : Smaller size;Cuffless         Oral Care Recommendations: Oral care QID Follow up Recommendations: Skilled Nursing facility SLP Visit Diagnosis: Dysphagia, oral phase (R13.11) Plan: Continue with current plan of care       GO                Amber Lopez 02/25/2020, 10:24 AM

## 2020-02-25 NOTE — Progress Notes (Signed)
PROGRESS NOTE  Amber Lopez  DOB: April 21, 1941  PCP: Billie Ruddy, MD [phone]  DOA: 01/29/2020  LOS: 27 days   Chief complaint: Strokelike symptoms  Brief narrative: Amber Lopez is a 79 y.o. female who presented to the ED on 01/29/2020 with left hemiplegia, facial droop, aphasia with right eye deviation. PMH significant for HIV/AIDS, HTN, HLD, DM, Depression, Arthritis, Breast Cancer  CTA head and neck showed emergent LVO at the right M1 segment for which patient underwent TPA and revascularization by neuro IR. She was admitted to ICU post procedure on mechanical ventilation.  Over the next several days, patient's neurological status continued to remain poor.  She was not able to follow commands.  She was not able to be weaned off ventilator. 10/20 trach placed 10/29 PEG placed Transferred out to hospitalist service on 11/2.  Subjective: Patient was seen and examined this morning. Elderly African-American female. Trach, PEG.  Somnolent.  Unable to have a conversation.  Unable to follow a motor commands for me  Assessment/Plan: 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 -Status post TPA and revascularization. -Patient has left-sided hemineglect and left hemiparesis.  Physical therapy involved. -Continue secondary stroke management, Asa, Information systems manager -worked with PT yesterday with slightly more tolerance  Acute ventilatory dependent respiratory failure with hypoxemia failure to wean  Inability to clear secretions -now s/p trach, continuing trach collar -Continue aggressive pulmonary hygiene -Speech therapy evaluation.  MBS 11/2 done.  Dysphagia one diet recommended.  Use PMV with all oral intake.  Adenocarcinoma of the left lung with left pleural effusion  -CT of chest on admission showed persistent large left pleural effusion with near complete left lung collapse. Left apical mass measuring 3.5 x 3.3 cm suspicious for bronchogenic carcinoma -Oncology  consultation obtained. -poor performance status precludes chemotherapy with metastatic disease   Hypertension Sinus tachycardia -Heart rate more than 100 and blood pressure more than 150 mostly. -Start on Coreg 3.125 mg twice daily.  HIV/AIDS -Continue home medications including: atovaquone, dolutegravir and descovy  Type 2 diabetes mellitus -A1c 6.5 on 10/7 -Currently on 15 units Levemir twice daily with sliding scale insulin. Recent Labs  Lab 02/24/20 2005 02/25/20 0013 02/25/20 0419 02/25/20 0814 02/25/20 1018  GLUCAP 193* 172* 185* 139* 190*   Chronic normocytic anemia -Stable.  No current evidence of bleeding Recent Labs    02/15/20 1630 02/15/20 1630 02/16/20 0611 02/16/20 0611 02/17/20 1300 02/17/20 1300 02/18/20 0335 02/24/20 0654  HGB 9.2*  --  9.0*  --  9.2*  --  8.1* 9.9*  MCV 94.9   < > 96.3   < > 94.2   < > 94.5 95.0   < > = values in this interval not displayed.   [place]  -Family has requested transfer to another facility when informed poor prognosis.    Palliative care consult appreciated. -LTAC denied -Prior discussion with [facility] and no beds available. Currently, no clear indication for services requiring transfer to a tertiary or quaternary level of care  Mobility: PT involved Code Status:   Code Status: DNR  Nutritional status: Body mass index is 27.69 kg/m. Nutrition Problem: Moderate Malnutrition Etiology: chronic illness (HIV) Signs/Symptoms: mild muscle depletion, moderate muscle depletion, mild fat depletion, moderate fat depletion Diet Order            DIET - DYS 1 Room service appropriate? No; Fluid consistency: Thin  Diet effective now  DVT prophylaxis: enoxaparin (LOVENOX) injection 40 mg Start: 02/03/20 1000   Antimicrobials:  Completed the course of antibiotics Fluid: None Consultants: Neurology, IR, critical care, oncology, Palliative care Family Communication:  None at bedside  Status is:  Inpatient  Remains inpatient appropriate because:Unsafe d/c plan   Dispo: The patient is from: Home              Anticipated d/c is to: SNF               Anticipated d/c date is: Whenever bed is available              Patient currently is medically stable for discharge but she is a new trach.  Needs SNF.  Infusions:  . sodium chloride 250 mL (02/11/20 2212)  . sodium chloride Stopped (02/09/20 0556)  . feeding supplement (OSMOLITE 1.2 CAL) 1,000 mL (02/23/20 1728)    Scheduled Meds: . aspirin  81 mg Per Tube Daily  . atovaquone  1,500 mg Per Tube Q breakfast  . carvedilol  3.125 mg Oral BID WC  . chlorhexidine  15 mL Mouth Rinse BID  . Chlorhexidine Gluconate Cloth  6 each Topical Daily  . docusate  100 mg Per Tube BID  . dolutegravir  50 mg Oral Daily  . emtricitabine-tenofovir AF  1 tablet Per Tube Daily  . enoxaparin (LOVENOX) injection  40 mg Subcutaneous Q24H  . fiber  1 packet Per Tube BID  . influenza vaccine adjuvanted  0.5 mL Intramuscular Tomorrow-1000  . insulin aspart  0-15 Units Subcutaneous Q4H  . insulin detemir  15 Units Subcutaneous BID  . mouth rinse  15 mL Mouth Rinse q12n4p  . pantoprazole sodium  40 mg Per Tube QHS  . polyethylene glycol  17 g Per Tube Daily  . rosuvastatin  10 mg Per NG tube Daily    Antimicrobials: Anti-infectives (From admission, onward)   Start     Dose/Rate Route Frequency Ordered Stop   02/12/20 1000  vancomycin (VANCOREADY) IVPB 1250 mg/250 mL        1,250 mg 166.7 mL/hr over 90 Minutes Intravenous Every 24 hours 02/11/20 1509 02/18/20 1438   02/07/20 1310  vancomycin (VANCOREADY) IVPB 750 mg/150 mL  Status:  Discontinued        750 mg 150 mL/hr over 60 Minutes Intravenous Every 12 hours 02/07/20 1310 02/11/20 1509   02/06/20 0000  vancomycin (VANCOREADY) IVPB 500 mg/100 mL  Status:  Discontinued        500 mg 100 mL/hr over 60 Minutes Intravenous Every 12 hours 02/05/20 1141 02/07/20 1310   02/05/20 1300  ceFEPIme  (MAXIPIME) 2 g in sodium chloride 0.9 % 100 mL IVPB        2 g 200 mL/hr over 30 Minutes Intravenous Every 12 hours 02/05/20 1127 02/11/20 2140   02/05/20 1300  atovaquone (MEPRON) 750 MG/5ML suspension 1,500 mg        1,500 mg Per Tube Daily with breakfast 02/05/20 1137     02/05/20 1230  vancomycin (VANCOCIN) IVPB 1000 mg/200 mL premix        1,000 mg 200 mL/hr over 60 Minutes Intravenous  Once 02/05/20 1127 02/05/20 1304   01/30/20 1000  dolutegravir (TIVICAY) tablet 50 mg        50 mg Oral Daily 01/30/20 0941     01/30/20 1000  emtricitabine-tenofovir AF (DESCOVY) 200-25 MG per tablet 1 tablet        1 tablet Per Tube Daily 01/30/20 0941  01/29/20 1600  emtricitabine-tenofovir AF (DESCOVY) 200-25 MG per tablet 1 tablet  Status:  Discontinued        1 tablet Per Tube Daily 01/29/20 1437 01/29/20 1507   01/29/20 1600  dolutegravir (TIVICAY) tablet 50 mg  Status:  Discontinued        50 mg Oral Daily 01/29/20 1437 01/29/20 1507   01/29/20 1415  bictegravir-emtricitabine-tenofovir AF (BIKTARVY) 50-200-25 MG per tablet 1 tablet  Status:  Discontinued        1 tablet Oral Daily 01/29/20 1408 01/29/20 1437   01/29/20 1045  amoxicillin-clavulanate (AUGMENTIN) 875-125 MG per tablet 1 tablet  Status:  Discontinued        1 tablet Oral 2 times daily 01/29/20 1041 01/29/20 1510   01/29/20 1045  bictegravir-emtricitabine-tenofovir AF (BIKTARVY) 50-200-25 MG per tablet 1 tablet  Status:  Discontinued        1 tablet Oral Daily 01/29/20 1041 01/29/20 1042   01/29/20 0913  ceFAZolin (ANCEF) 2-4 GM/100ML-% IVPB       Note to Pharmacy: Tressie Stalker   : cabinet override      01/29/20 0913 01/29/20 2114      PRN meds: Place/Maintain arterial line **AND** sodium chloride, acetaminophen **OR** acetaminophen (TYLENOL) oral liquid 160 mg/5 mL **OR** acetaminophen, labetalol, senna-docusate   Objective: Vitals:   02/25/20 0820 02/25/20 0846  BP:  139/62  Pulse: (!) 110 89  Resp: (!) 26 18  Temp:   98.6 F (37 C)  SpO2: 96% 100%    Intake/Output Summary (Last 24 hours) at 02/25/2020 1051 Last data filed at 02/25/2020 0506 Gross per 24 hour  Intake 660 ml  Output 900 ml  Net -240 ml   Filed Weights   02/20/20 0500 02/23/20 0500 02/24/20 0443  Weight: 55.9 kg 60.1 kg 60.1 kg   Weight change:  Body mass index is 27.69 kg/m.   Physical Exam: General exam: Somnolent.  Not in physical distress Skin: No rashes, lesions or ulcers. HEENT: Has a tracheostomy tube anteriorly Lungs: Clear to auscultation on my examination CVS: Sinus tachycardia, no murmur GI/Abd soft, nontender, nondistended, PEG tube site intact CNS: Somnolent.  Unable to follow verbal or motor command Psychiatry: Somnolent Extremities: No pedal edema, no calf tenderness  Data Review: I have personally reviewed the laboratory data and studies available.  Recent Labs  Lab 02/24/20 0654  WBC 10.7*  HGB 9.9*  HCT 32.3*  MCV 95.0  PLT 646*   Recent Labs  Lab 02/24/20 0654  NA 136  K 4.8  CL 95*  CO2 30  GLUCOSE 155*  BUN 15  CREATININE 0.89  CALCIUM 8.9    F/u labs ordered.  Signed, Terrilee Croak, MD [facility] 02/25/2020

## 2020-02-26 DIAGNOSIS — I63511 Cerebral infarction due to unspecified occlusion or stenosis of right middle cerebral artery: Secondary | ICD-10-CM | POA: Diagnosis not present

## 2020-02-26 DIAGNOSIS — J9601 Acute respiratory failure with hypoxia: Secondary | ICD-10-CM | POA: Diagnosis not present

## 2020-02-26 LAB — GLUCOSE, CAPILLARY
Glucose-Capillary: 102 mg/dL — ABNORMAL HIGH (ref 70–99)
Glucose-Capillary: 119 mg/dL — ABNORMAL HIGH (ref 70–99)
Glucose-Capillary: 134 mg/dL — ABNORMAL HIGH (ref 70–99)
Glucose-Capillary: 148 mg/dL — ABNORMAL HIGH (ref 70–99)
Glucose-Capillary: 167 mg/dL — ABNORMAL HIGH (ref 70–99)
Glucose-Capillary: 45 mg/dL — ABNORMAL LOW (ref 70–99)
Glucose-Capillary: 95 mg/dL (ref 70–99)

## 2020-02-26 MED ORDER — DEXTROSE 50 % IV SOLN
INTRAVENOUS | Status: AC
  Administered 2020-02-26: 50 mL
  Filled 2020-02-26: qty 50

## 2020-02-26 MED ORDER — CARVEDILOL 3.125 MG PO TABS
3.1250 mg | ORAL_TABLET | Freq: Two times a day (BID) | ORAL | Status: DC
  Administered 2020-02-26 – 2020-03-22 (×50): 3.125 mg
  Filled 2020-02-26 (×51): qty 1

## 2020-02-26 MED ORDER — ADULT MULTIVITAMIN LIQUID CH
15.0000 mL | Freq: Every day | ORAL | Status: DC
  Administered 2020-02-26 – 2020-03-17 (×21): 15 mL
  Filled 2020-02-26 (×21): qty 15

## 2020-02-26 NOTE — Progress Notes (Signed)
PROGRESS NOTE  Amber Lopez  DOB: Mar 31, 1941  PCP: Billie Ruddy, MD [phone]  DOA: 01/29/2020  LOS: 28 days   Chief complaint: Strokelike symptoms  Brief narrative: Amber Lopez is a 79 y.o. female who presented to the ED on 01/29/2020 with left hemiplegia, facial droop, aphasia with right eye deviation. PMH significant for HIV/AIDS, HTN, HLD, DM, Depression, Arthritis, Breast Cancer  CTA head and neck showed emergent LVO at the right M1 segment for which patient underwent TPA and revascularization by neuro IR. She was admitted to ICU post procedure on mechanical ventilation.  Over the next several days, patient's neurological status continued to remain poor.  She was not able to follow commands.  She was not able to be weaned off ventilator. 10/20 trach placed 10/29 PEG placed Transferred out to hospitalist service on 11/2.  Subjective: Patient was seen and examined this morning. Elderly African-American female. She was awake and was able to tell me her name but no other response after that. Unable to follow command. Tracheostomy with 5 L oxygen.  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 -Patient has left-sided hemineglect and left hemiparesis.  Physical therapy involved. -Continue secondary stroke management, Asa, crestor  Acute ventilatory dependent respiratory failure with hypoxemia failure to wean  Inability to clear secretions -now s/p trach, continuing trach collar -Continue aggressive pulmonary hygiene -Speech therapy evaluation.  MBS 11/2 done.  Dysphagia one diet recommended.  Use PMV with all oral intake.  Adenocarcinoma of the left lung with left pleural effusion  -CT of chest on admission showed persistent large left pleural effusion with near complete left lung collapse. Left apical mass measuring 3.5 x 3.3 cm suspicious for bronchogenic carcinoma -Oncology consultation  obtained. -poor performance status precludes chemotherapy with metastatic disease   Hypertension Sinus tachycardia -Heart rate and blood pressure improving after Coreg was started on 11/3.   HIV/AIDS -Continue home medications including: atovaquone, dolutegravir and descovy  Type 2 diabetes mellitus -A1c 6.5 on 10/7 -Currently on 15 units Levemir twice daily with sliding scale insulin. Recent Labs  Lab 02/25/20 2140 02/25/20 2351 02/26/20 0327 02/26/20 0756 02/26/20 1143  GLUCAP 127* 128* 95 119* 148*   Chronic normocytic anemia -Stable.  No current evidence of bleeding Recent Labs    02/15/20 1630 02/15/20 1630 02/16/20 0611 02/16/20 0611 02/17/20 1300 02/17/20 1300 02/18/20 0335 02/24/20 0654  HGB 9.2*  --  9.0*  --  9.2*  --  8.1* 9.9*  MCV 94.9   < > 96.3   < > 94.2   < > 94.5 95.0   < > = values in this interval not displayed.   [place]  -Family has requested transfer to another facility when informed poor prognosis. Palliative care consult appreciated. -LTAC denied -Prior discussion with [facility] and no beds available. Currently, no clear indication for services requiring transfer to a tertiary or quaternary level of care  Mobility: PT involved Code Status:   Code Status: DNR  Nutritional status: Body mass index is 26.45 kg/m. Nutrition Problem: Moderate Malnutrition Etiology: chronic illness (HIV) Signs/Symptoms: mild muscle depletion, moderate muscle depletion, mild fat depletion, moderate fat depletion Diet Order            DIET - DYS 1 Room service appropriate? No; Fluid consistency: Thin  Diet effective now                DVT prophylaxis: enoxaparin (LOVENOX) injection 40 mg Start: 02/03/20  1000   Antimicrobials:  Completed the course of antibiotics Fluid: None Consultants: Neurology, IR, critical care, oncology, Palliative care Family Communication:  None at bedside  Status is: Inpatient  Remains inpatient appropriate because:Unsafe d/c  plan   Dispo: The patient is from: Home              Anticipated d/c is to: SNF               Anticipated d/c date is: Whenever bed is available              Patient currently is medically stable for discharge but she is a new trach.  Needs SNF.  Infusions:  . sodium chloride 250 mL (02/11/20 2212)  . sodium chloride Stopped (02/09/20 0556)    Scheduled Meds: . aspirin  81 mg Per Tube Daily  . atovaquone  1,500 mg Per Tube Q breakfast  . carvedilol  3.125 mg Per Tube BID WC  . chlorhexidine  15 mL Mouth Rinse BID  . Chlorhexidine Gluconate Cloth  6 each Topical Daily  . docusate  100 mg Per Tube BID  . dolutegravir  50 mg Oral Daily  . emtricitabine-tenofovir AF  1 tablet Per Tube Daily  . enoxaparin (LOVENOX) injection  40 mg Subcutaneous Q24H  . feeding supplement  237 mL Oral BID BM  . feeding supplement (OSMOLITE 1.2 CAL)  1,000 mL Per Tube Q24H  . fiber  1 packet Per Tube BID  . influenza vaccine adjuvanted  0.5 mL Intramuscular Tomorrow-1000  . insulin aspart  0-15 Units Subcutaneous Q4H  . insulin detemir  15 Units Subcutaneous BID  . mouth rinse  15 mL Mouth Rinse q12n4p  . multivitamin  15 mL Per Tube Daily  . pantoprazole sodium  40 mg Per Tube QHS  . polyethylene glycol  17 g Per Tube Daily  . rosuvastatin  10 mg Per NG tube Daily    Antimicrobials: Anti-infectives (From admission, onward)   Start     Dose/Rate Route Frequency Ordered Stop   02/12/20 1000  vancomycin (VANCOREADY) IVPB 1250 mg/250 mL        1,250 mg 166.7 mL/hr over 90 Minutes Intravenous Every 24 hours 02/11/20 1509 02/18/20 1438   02/07/20 1310  vancomycin (VANCOREADY) IVPB 750 mg/150 mL  Status:  Discontinued        750 mg 150 mL/hr over 60 Minutes Intravenous Every 12 hours 02/07/20 1310 02/11/20 1509   02/06/20 0000  vancomycin (VANCOREADY) IVPB 500 mg/100 mL  Status:  Discontinued        500 mg 100 mL/hr over 60 Minutes Intravenous Every 12 hours 02/05/20 1141 02/07/20 1310   02/05/20  1300  ceFEPIme (MAXIPIME) 2 g in sodium chloride 0.9 % 100 mL IVPB        2 g 200 mL/hr over 30 Minutes Intravenous Every 12 hours 02/05/20 1127 02/11/20 2140   02/05/20 1300  atovaquone (MEPRON) 750 MG/5ML suspension 1,500 mg        1,500 mg Per Tube Daily with breakfast 02/05/20 1137     02/05/20 1230  vancomycin (VANCOCIN) IVPB 1000 mg/200 mL premix        1,000 mg 200 mL/hr over 60 Minutes Intravenous  Once 02/05/20 1127 02/05/20 1304   01/30/20 1000  dolutegravir (TIVICAY) tablet 50 mg        50 mg Oral Daily 01/30/20 0941     01/30/20 1000  emtricitabine-tenofovir AF (DESCOVY) 200-25 MG per tablet 1 tablet  1 tablet Per Tube Daily 01/30/20 0941     01/29/20 1600  emtricitabine-tenofovir AF (DESCOVY) 200-25 MG per tablet 1 tablet  Status:  Discontinued        1 tablet Per Tube Daily 01/29/20 1437 01/29/20 1507   01/29/20 1600  dolutegravir (TIVICAY) tablet 50 mg  Status:  Discontinued        50 mg Oral Daily 01/29/20 1437 01/29/20 1507   01/29/20 1415  bictegravir-emtricitabine-tenofovir AF (BIKTARVY) 50-200-25 MG per tablet 1 tablet  Status:  Discontinued        1 tablet Oral Daily 01/29/20 1408 01/29/20 1437   01/29/20 1045  amoxicillin-clavulanate (AUGMENTIN) 875-125 MG per tablet 1 tablet  Status:  Discontinued        1 tablet Oral 2 times daily 01/29/20 1041 01/29/20 1510   01/29/20 1045  bictegravir-emtricitabine-tenofovir AF (BIKTARVY) 50-200-25 MG per tablet 1 tablet  Status:  Discontinued        1 tablet Oral Daily 01/29/20 1041 01/29/20 1042   01/29/20 0913  ceFAZolin (ANCEF) 2-4 GM/100ML-% IVPB       Note to Pharmacy: Tressie Stalker   : cabinet override      01/29/20 0913 01/29/20 2114      PRN meds: Place/Maintain arterial line **AND** sodium chloride, acetaminophen **OR** acetaminophen (TYLENOL) oral liquid 160 mg/5 mL **OR** acetaminophen, labetalol, senna-docusate   Objective: Vitals:   02/26/20 1108 02/26/20 1129  BP: 128/62   Pulse: 88 98  Resp: (!) 24  (!) 22  Temp: 98.3 F (36.8 C)   SpO2: 97%     Intake/Output Summary (Last 24 hours) at 02/26/2020 1414 Last data filed at 02/26/2020 1000 Gross per 24 hour  Intake 401 ml  Output 1100 ml  Net -699 ml   Filed Weights   02/23/20 0500 02/24/20 0443 02/26/20 0425  Weight: 60.1 kg 60.1 kg 57.4 kg   Weight change:  Body mass index is 26.45 kg/m.   Physical Exam: General exam: Somnolent.  Not in physical distress Skin: No rashes, lesions or ulcers. HEENT: Has a tracheostomy tube anteriorly Lungs: Clear to auscultation bilaterally CVS: Sinus tachycardia, no murmur GI/Abd soft, nontender, nondistended, PEG tube site intact CNS: Awake, alert, responded to name,, unable to follow any other command. Unable to make any purposeful movement. Psychiatry: Somnolent Extremities: No pedal edema, no calf tenderness  Data Review: I have personally reviewed the laboratory data and studies available.  Recent Labs  Lab 02/24/20 0654  WBC 10.7*  HGB 9.9*  HCT 32.3*  MCV 95.0  PLT 646*   Recent Labs  Lab 02/24/20 0654  NA 136  K 4.8  CL 95*  CO2 30  GLUCOSE 155*  BUN 15  CREATININE 0.89  CALCIUM 8.9    F/u labs ordered.  Signed, Terrilee Croak, MD [facility] 02/26/2020

## 2020-02-26 NOTE — Progress Notes (Signed)
Physical Therapy Treatment Patient Details Name: Amber Lopez MRN: [id number] DOB: [birth year] Today's Date: 02/26/2020    History of Present Illness 79 y.o. female with a PMHx of depression, HLD, HIV, HTN and DM presenting with acute onset of left hemiplegia, left facial droop, right eye deviation and aphasia. Pt received IV tPA and underwent R MCA revacularization on 01/29/2020. Pt also found to have large left pleural effusion with near complete L lung collapse and L apical mass. Chest tube placed on 01/31/2020. s/p trach on 02/11/20.     PT Comments    Pt limited by fatigue and seemingly by pain during session, intermittently moaning but unable to communicate area of discomfort. Pt continues to require significant physical assistance to perform all functional mobility tasks and does not more L side to command this session. Pt is able to maintain sitting balance with RUE support but fatigues quickly and requires PT assist to maintain for greater than 30 seconds. PT continues to encourage attempts at active cervical rotation and extension during session. HEP to have the patient attempt to look toward the door of the room on left side to greet visitors to promote left cervical rotation. PT continues to recommend SNF placement at this time.   Follow Up Recommendations  SNF     Equipment Recommendations  Wheelchair (measurements PT);Wheelchair cushion (measurements PT);Hospital bed;3in1 (PT)    Recommendations for Other Services       Precautions / Restrictions Precautions Precautions: Fall Precaution Comments: trach and flexi Restrictions Weight Bearing Restrictions: No    Mobility  Bed Mobility Overal bed mobility: Needs Assistance Bed Mobility: Supine to Sit;Sit to Supine;Rolling Rolling: Max assist   Supine to sit: Max assist;HOB elevated Sit to supine: Total assist      Transfers Overall transfer level: Needs assistance Equipment used: 1 person hand held assist Transfers:  Sit to/from Stand Sit to Stand: Total assist         General transfer comment: PT provides L knee block and BUE support, little activation of LE strength noted from patient  Ambulation/Gait                 Stairs             Wheelchair Mobility    Modified Rankin (Stroke Patients Only) Modified Rankin (Stroke Patients Only) Pre-Morbid Rankin Score: Slight disability Modified Rankin: Severe disability     Balance Overall balance assessment: Needs assistance Sitting-balance support: Single extremity supported;Feet supported Sitting balance-Leahy Scale: Poor Sitting balance - Comments: min-modA with RUE support of bed rail or bed, minA with lean onto R elbow Postural control: Left lateral lean Standing balance support: Bilateral upper extremity supported Standing balance-Leahy Scale: Zero Standing balance comment: totalA                            Cognition Arousal/Alertness: Awake/alert Behavior During Therapy: Flat affect Overall Cognitive Status: Impaired/Different from baseline Area of Impairment: Attention;Memory;Following commands;Safety/judgement;Awareness;Problem solving                   Current Attention Level: Focused Memory: Decreased recall of precautions;Decreased short-term memory Following Commands: Follows one step commands inconsistently;Follows one step commands with increased time Safety/Judgement: Decreased awareness of safety;Decreased awareness of deficits Awareness: Intellectual Problem Solving: Slow processing;Decreased initiation        Exercises      General Comments General comments (skin integrity, edema, etc.): VSS, pt on 5L 21% FiO2 trach collar.  Pt noted to have leakage from flexiseal and with leakage of urine around purewick. PT assists in hygiene tasks with help of nursing staff      Pertinent Vitals/Pain Pain Assessment: Faces Faces Pain Scale: Hurts even more Pain Location: generalized Pain  Descriptors / Indicators: Moaning Pain Intervention(s): Monitored during session    Home Living                      Prior Function            PT Goals (current goals can now be found in the care plan section) Acute Rehab PT Goals Patient Stated Goal: to reduce pain Time For Goal Achievement: 03/11/20 Progress towards PT goals: Not progressing toward goals - comment    Frequency    Min 3X/week      PT Plan Current plan remains appropriate    Co-evaluation              AM-PAC PT "6 Clicks" Mobility   Outcome Measure  Help needed turning from your back to your side while in a flat bed without using bedrails?: Total Help needed moving from lying on your back to sitting on the side of a flat bed without using bedrails?: Total Help needed moving to and from a bed to a chair (including a wheelchair)?: Total Help needed standing up from a chair using your arms (e.g., wheelchair or bedside chair)?: Total Help needed to walk in hospital room?: Total Help needed climbing 3-5 steps with a railing? : Total 6 Click Score: 6    End of Session Equipment Utilized During Treatment: Oxygen Activity Tolerance: Patient limited by fatigue;Patient limited by pain Patient left: in bed;with call bell/phone within reach;with bed alarm set;with nursing/sitter in room Nurse Communication: Mobility status;Need for lift equipment PT Visit Diagnosis: Other abnormalities of gait and mobility (R26.89);Muscle weakness (generalized) (M62.81);Hemiplegia and hemiparesis;Other symptoms and signs involving the nervous system (R29.898) Hemiplegia - Right/Left: Left Hemiplegia - dominant/non-dominant: Non-dominant Hemiplegia - caused by: Cerebral infarction     Time: 1275-1700 PT Time Calculation (min) (ACUTE ONLY): 30 min  Charges:  $Therapeutic Activity: 23-37 mins                     Zenaida Niece, PT, DPT Acute Rehabilitation Pager: [phone]    Zenaida Niece 02/26/2020, 1:59  PM

## 2020-02-26 NOTE — TOC Progression Note (Signed)
Transition of Care [facility]) - Progression Note    Patient Details  Name: Amber Lopez MRN: [id number] Date of Birth: 05/16/1940  [facility] CM/SW Contact  Joanne Chars, LCSW Phone Number: 02/26/2020, 1:24 PM  Clinical Narrative:   CSW made attempt to meet with pt to complete assessment but pt not able to participate.  CSW attempted to contact sister and brother as listed on face sheet. (brother's phone number is incorrect it appears) CSW LM with each of them.  CSW also responded to question in PASSR related to SNF vs LTAC.    Expected Discharge Plan: Long Term Acute Care (LTAC) Barriers to Discharge: Continued Medical Work up  Expected Discharge Plan and Services Expected Discharge Plan: Long Term Acute Care (LTAC)   Discharge Planning Services: CM Consult Post Acute Care Choice: Long Term Acute Care (LTAC) Living arrangements for the past 2 months: Single Family Home                                       Social Determinants of Health (SDOH) Interventions    Readmission Risk Interventions No flowsheet data found.

## 2020-02-26 NOTE — Plan of Care (Signed)
  Problem: Education: Goal: Knowledge of General Education information will improve Description: Including pain rating scale, medication(s)/side effects and non-pharmacologic comfort measures Outcome: Progressing   Problem: Health Behavior/Discharge Planning: Goal: Ability to manage health-related needs will improve Outcome: Progressing   Problem: Clinical Measurements: Goal: Ability to maintain clinical measurements within normal limits will improve Outcome: Progressing Goal: Will remain free from infection Outcome: Progressing Goal: Diagnostic test results will improve Outcome: Progressing Goal: Respiratory complications will improve Outcome: Progressing Goal: Cardiovascular complication will be avoided Outcome: Progressing   Problem: Activity: Goal: Risk for activity intolerance will decrease Outcome: Progressing   Problem: Nutrition: Goal: Adequate nutrition will be maintained Outcome: Progressing   Problem: Coping: Goal: Level of anxiety will decrease Outcome: Progressing   Problem: Elimination: Goal: Will not experience complications related to bowel motility Outcome: Progressing Goal: Will not experience complications related to urinary retention Outcome: Progressing   Problem: Pain Managment: Goal: General experience of comfort will improve Outcome: Progressing   Problem: Safety: Goal: Ability to remain free from injury will improve Outcome: Progressing   Problem: Skin Integrity: Goal: Risk for impaired skin integrity will decrease Outcome: Progressing   Problem: Education: Goal: Knowledge of disease or condition will improve Outcome: Progressing Goal: Knowledge of secondary prevention will improve Outcome: Progressing Goal: Knowledge of patient specific risk factors addressed and post discharge goals established will improve Outcome: Progressing Goal: Individualized Educational Video(s) Outcome: Progressing   Problem: Coping: Goal: Will verbalize  positive feelings about self Outcome: Progressing Goal: Will identify appropriate support needs Outcome: Progressing   Problem: Health Behavior/Discharge Planning: Goal: Ability to manage health-related needs will improve Outcome: Progressing   Problem: Self-Care: Goal: Ability to participate in self-care as condition permits will improve Outcome: Progressing Goal: Verbalization of feelings and concerns over difficulty with self-care will improve Outcome: Progressing Goal: Ability to communicate needs accurately will improve Outcome: Progressing   Problem: Nutrition: Goal: Risk of aspiration will decrease Outcome: Progressing Goal: Dietary intake will improve Outcome: Progressing   Problem: Ischemic Stroke/TIA Tissue Perfusion: Goal: Complications of ischemic stroke/TIA will be minimized Outcome: Progressing   Problem: Education: Goal: Knowledge about tracheostomy care/management will improve Outcome: Progressing   Problem: Activity: Goal: Ability to tolerate increased activity will improve Outcome: Progressing   Problem: Health Behavior/Discharge Planning: Goal: Ability to manage tracheostomy will improve Outcome: Progressing   Problem: Respiratory: Goal: Patent airway maintenance will improve Outcome: Progressing   Problem: Role Relationship: Goal: Ability to communicate will improve Outcome: Progressing

## 2020-02-27 DIAGNOSIS — J9601 Acute respiratory failure with hypoxia: Secondary | ICD-10-CM | POA: Diagnosis not present

## 2020-02-27 LAB — GLUCOSE, CAPILLARY
Glucose-Capillary: 100 mg/dL — ABNORMAL HIGH (ref 70–99)
Glucose-Capillary: 135 mg/dL — ABNORMAL HIGH (ref 70–99)
Glucose-Capillary: 152 mg/dL — ABNORMAL HIGH (ref 70–99)
Glucose-Capillary: 82 mg/dL (ref 70–99)
Glucose-Capillary: 84 mg/dL (ref 70–99)

## 2020-02-27 NOTE — Progress Notes (Signed)
Physical Therapy Treatment Patient Details Name: Amber Lopez MRN: [id number] DOB: 1941/01/13 Today's Date: 02/27/2020    History of Present Illness 79 y.o. female with a PMHx of depression, HLD, HIV, HTN and DM presenting with acute onset of left hemiplegia, left facial droop, right eye deviation and aphasia. Pt received IV tPA and underwent R MCA revacularization on 01/29/2020. Pt also found to have large left pleural effusion with near complete L lung collapse and L apical mass. Chest tube placed on 01/31/2020. s/p trach on 02/11/20.     PT Comments    Pt continues to demonstrate little to no progress with acute PT services. Pt fatigues quickly and even demonstrates some inconsistency in command following with RUE at times this session. Pt remains flaccid on L side and requires significant physical assistance to perform all functional mobility tasks. If the pt continues to demonstrate little progress PT may reduce frequency to 2x/week. Pt will benefit from continued POC in an attempt to reduce caregiver burden and improve bed mobility quality.   Follow Up Recommendations  SNF     Equipment Recommendations  Wheelchair (measurements PT);Wheelchair cushion (measurements PT);Hospital bed;3in1 (PT)    Recommendations for Other Services       Precautions / Restrictions Precautions Precautions: Fall Precaution Comments: trach and flexi Restrictions Weight Bearing Restrictions: No    Mobility  Bed Mobility Overal bed mobility: Needs Assistance Bed Mobility: Supine to Sit;Sit to Supine;Rolling Rolling: Max assist   Supine to sit: Max assist;HOB elevated;+2 for physical assistance Sit to supine: Total assist;+2 for physical assistance   General bed mobility comments: pt is able to pull through LUE to assist in elevating trunk into sitting  Transfers Overall transfer level: Needs assistance Equipment used: 2 person hand held assist Transfers: Sit to/from Stand Sit to Stand: Max  assist;+2 physical assistance         General transfer comment: pt requires bilateral knee block, BUE support, and use of bed pad to facilitate hip extension  Ambulation/Gait                 Stairs             Wheelchair Mobility    Modified Rankin (Stroke Patients Only) Modified Rankin (Stroke Patients Only) Pre-Morbid Rankin Score: Slight disability Modified Rankin: Severe disability     Balance Overall balance assessment: Needs assistance Sitting-balance support: Single extremity supported;Feet supported Sitting balance-Leahy Scale: Poor Sitting balance - Comments: pt is able to maintain sitting balance when leaned onto R elbow, otherwise requires RUE support or mod-maxA to maintain sitting and prevent loss of balance in multiple directions Postural control: Left lateral lean Standing balance support: Bilateral upper extremity supported Standing balance-Leahy Scale: Zero Standing balance comment: totalA x2                            Cognition Arousal/Alertness: Awake/alert Behavior During Therapy: Flat affect Overall Cognitive Status: Impaired/Different from baseline Area of Impairment: Attention;Memory;Following commands;Safety/judgement;Awareness;Problem solving                   Current Attention Level: Focused Memory: Decreased recall of precautions;Decreased short-term memory Following Commands: Follows one step commands with increased time (pt follows 50-75% of verbal commands) Safety/Judgement: Decreased awareness of safety;Decreased awareness of deficits Awareness: Intellectual Problem Solving: Slow processing;Requires verbal cues;Requires tactile cues;Decreased initiation        Exercises      General Comments General comments (skin integrity,  edema, etc.): VSS , pt on 5L trach collar      Pertinent Vitals/Pain Pain Assessment: Faces Faces Pain Scale: Hurts even more Pain Location: generalized Pain Descriptors /  Indicators: Grimacing Pain Intervention(s): Monitored during session    Home Living                      Prior Function            PT Goals (current goals can now be found in the care plan section) Acute Rehab PT Goals Patient Stated Goal: to reduce pain Progress towards PT goals: Not progressing toward goals - comment    Frequency    Min 3X/week      PT Plan Current plan remains appropriate    Co-evaluation PT/OT/SLP Co-Evaluation/Treatment: Yes Reason for Co-Treatment: Complexity of the patient's impairments (multi-system involvement);Necessary to address cognition/behavior during functional activity;For patient/therapist safety;To address functional/ADL transfers PT goals addressed during session: Mobility/safety with mobility;Balance;Strengthening/ROM        AM-PAC PT "6 Clicks" Mobility   Outcome Measure  Help needed turning from your back to your side while in a flat bed without using bedrails?: Total Help needed moving from lying on your back to sitting on the side of a flat bed without using bedrails?: Total Help needed moving to and from a bed to a chair (including a wheelchair)?: Total Help needed standing up from a chair using your arms (e.g., wheelchair or bedside chair)?: Total Help needed to walk in hospital room?: Total Help needed climbing 3-5 steps with a railing? : Total 6 Click Score: 6    End of Session Equipment Utilized During Treatment: Oxygen Activity Tolerance: Patient limited by fatigue Patient left: in bed;with call bell/phone within reach;with bed alarm set Nurse Communication: Mobility status;Need for lift equipment PT Visit Diagnosis: Other abnormalities of gait and mobility (R26.89);Muscle weakness (generalized) (M62.81);Hemiplegia and hemiparesis;Other symptoms and signs involving the nervous system (R29.898) Hemiplegia - Right/Left: Left Hemiplegia - dominant/non-dominant: Non-dominant Hemiplegia - caused by: Cerebral  infarction     Time: 3335-4562 PT Time Calculation (min) (ACUTE ONLY): 33 min  Charges:  $Therapeutic Activity: 8-22 mins                     Zenaida Niece, PT, DPT Acute Rehabilitation Pager: [phone]    Zenaida Niece 02/27/2020, 1:30 PM

## 2020-02-27 NOTE — Progress Notes (Signed)
Occupational Therapy Treatment Patient Details Name: Amber Lopez MRN: [id number] DOB: 25-Feb-1941 Today's Date: 02/27/2020    History of present illness 79 y.o. female with a PMHx of depression, HLD, HIV, HTN and DM presenting with acute onset of left hemiplegia, left facial droop, right eye deviation and aphasia. Pt received IV tPA and underwent R MCA revacularization on 01/29/2020. Pt also found to have large left pleural effusion with near complete L lung collapse and L apical mass. Chest tube placed on 01/31/2020. s/p trach on 02/11/20.    OT comments  Pt completed eOB sitting with OT / PT and was able to elevate off EOB onto feet total +2 Max (A) this session. Pt with limited cervical neck rotation and R gaze preference. Pt could benefit from prafo boots for heel floating. Recommendation for [facility] upon d/c    Follow Up Recommendations  SNF    Equipment Recommendations  Hospital bed;Wheelchair cushion (measurements OT);Wheelchair (measurements OT);Other (comment) (hoyer mattress overlay)    Recommendations for Other Services      Precautions / Restrictions Precautions Precautions: Fall Precaution Comments: trach and flexiseal       Mobility Bed Mobility Overal bed mobility: Needs Assistance Bed Mobility: Supine to Sit;Sit to Supine;Rolling Rolling: Max assist   Supine to sit: Max assist;HOB elevated;+2 for physical assistance Sit to supine: Total assist;+2 for physical assistance   General bed mobility comments: pt is able to pull through LUE to assist in elevating trunk into sitting  Transfers Overall transfer level: Needs assistance Equipment used: 2 person hand held assist Transfers: Sit to/from Stand Sit to Stand: Max assist;+2 physical assistance         General transfer comment: pt requires bilateral knee block, BUE support, and use of bed pad to facilitate hip extension    Balance Overall balance assessment: Needs assistance Sitting-balance support: Single  extremity supported;Feet supported Sitting balance-Leahy Scale: Poor Sitting balance - Comments: pt is able to maintain sitting balance when leaned onto R elbow, otherwise requires RUE support or mod-maxA to maintain sitting and prevent loss of balance in multiple directions Postural control: Left lateral lean Standing balance support: Bilateral upper extremity supported Standing balance-Leahy Scale: Zero Standing balance comment: totalA x2                           ADL either performed or assessed with clinical judgement   ADL Overall ADL's : Needs assistance/impaired Eating/Feeding: NPO   Grooming: Maximal assistance;Sitting       Lower Body Bathing: Total assistance;+2 for physical assistance                         General ADL Comments: pt incontinence of stool and lack of awareness     Vision       Perception     Praxis      Cognition Arousal/Alertness: Awake/alert Behavior During Therapy: Flat affect Overall Cognitive Status: Impaired/Different from baseline Area of Impairment: Attention;Memory;Following commands;Safety/judgement;Awareness;Problem solving                   Current Attention Level: Focused Memory: Decreased recall of precautions;Decreased short-term memory Following Commands: Follows one step commands with increased time (pt follows 50-75% of verbal commands) Safety/Judgement: Decreased awareness of safety;Decreased awareness of deficits Awareness: Intellectual Problem Solving: Slow processing;Requires verbal cues;Requires tactile cues;Decreased initiation General Comments: pt states yes then does not initiated the task. pt states "you do it "  but later demonstrates ability to reach face        Exercises     Shoulder Instructions       General Comments 5L trach/ flexiseal leaking and RN aware. Recommendation for prafo booties for heel float. noted to have some redness at heels    Pertinent Vitals/ Pain        Pain Assessment: Faces Faces Pain Scale: Hurts even more Pain Location: generalized Pain Descriptors / Indicators: Grimacing Pain Intervention(s): Monitored during session;Repositioned  Home Living                                          Prior Functioning/Environment              Frequency  Min 2X/week        Progress Toward Goals  OT Goals(current goals can now be found in the care plan section)  Progress towards OT goals: Progressing toward goals  Acute Rehab OT Goals Patient Stated Goal: to lay down OT Goal Formulation: With patient Time For Goal Achievement: 03/09/20 Potential to Achieve Goals: Fair ADL Goals Pt Will Perform Grooming: with min assist;sitting Pt Will Perform Upper Body Bathing: with mod assist;sitting Pt Will Transfer to Toilet: with max assist;with +2 assist;bedside commode Additional ADL Goal #1: Pt will locate ADL items at midline with min cues Additional ADL Goal #2: Family will be independent with PROM/positioning Lt UE  Plan Discharge plan remains appropriate    Co-evaluation    PT/OT/SLP Co-Evaluation/Treatment: Yes Reason for Co-Treatment: Complexity of the patient's impairments (multi-system involvement)   OT goals addressed during session: ADL's and self-care      AM-PAC OT "6 Clicks" Daily Activity     Outcome Measure   Help from another person eating meals?: Total Help from another person taking care of personal grooming?: A Lot Help from another person toileting, which includes using toliet, bedpan, or urinal?: Total Help from another person bathing (including washing, rinsing, drying)?: Total Help from another person to put on and taking off regular upper body clothing?: Total Help from another person to put on and taking off regular lower body clothing?: Total 6 Click Score: 7    End of Session Equipment Utilized During Treatment: Oxygen  OT Visit Diagnosis: Hemiplegia and hemiparesis Hemiplegia -  Right/Left: Left Hemiplegia - dominant/non-dominant: Non-Dominant Hemiplegia - caused by: Cerebral infarction   Activity Tolerance Patient tolerated treatment well   Patient Left in bed;with call bell/phone within reach;with bed alarm set   Nurse Communication Mobility status;Precautions        Time: 0355-9741 OT Time Calculation (min): 30 min  Charges: OT General Charges $OT Visit: 1 Visit OT Treatments $Self Care/Home Management : 8-22 mins   Brynn, OTR/L  Acute Rehabilitation Services Pager: [phone] Office: [phone] .    Jeri Modena 02/27/2020, 4:34 PM

## 2020-02-27 NOTE — TOC Initial Note (Signed)
Transition of Care [facility]) - Initial/Assessment Note    Patient Details  Name: Amber Lopez MRN: [id number] Date of Birth: 01-May-1940  Transition of Care [facility]) CM/SW Contact:    Coralee Pesa, [place] Phone Number: 02/27/2020, 1:55 PM  Clinical Narrative:                 CSW met with pt and daughter, Amber, at bedside. Pt is currently disoriented, but daughter is agreeable to SNF placement. She has no facility preference and has agreed to a faxout. Records show the receipt of both pfizer vaccines. Will start insurance auth. SW will continue to follow.  Expected Discharge Plan: Skilled Nursing Facility Barriers to Discharge: Continued Medical Work up, SNF Pending bed offer   Patient Goals and CMS Choice Patient states their goals for this hospitalization and ongoing recovery are:: Pt unable to participate in discharge planning due to disorientation. CMS Medicare.gov Compare Post Acute Care list provided to:: Patient Represenative (must comment) (Daughter, [name]) Choice offered to / list presented to : Adult Children  Expected Discharge Plan and Services Expected Discharge Plan: [place] In-house Referral: Clinical Social Work Discharge Planning Services: CM Consult Post Acute Care Choice: [place] Living arrangements for the past 2 months: [place]                                      Prior Living Arrangements/Services Living arrangements for the past 2 months: Single Family Home Lives with:: Adult Children Patient language and need for interpreter reviewed:: Yes Do you feel safe going back to the place where you live?: Yes      Need for Family Participation in Patient Care: Yes (Comment) Care giver support system in place?: Yes (comment) Current home services: DME Criminal Activity/Legal Involvement Pertinent to Current Situation/Hospitalization: No - Comment as needed  Activities of Daily Living Home Assistive  Devices/Equipment: None ADL Screening (condition at time of admission) Patient's cognitive ability adequate to safely complete daily activities?: Yes Is the patient deaf or have difficulty hearing?: No Does the patient have difficulty seeing, even when wearing glasses/contacts?: No Does the patient have difficulty concentrating, remembering, or making decisions?: No Patient able to express need for assistance with ADLs?: Yes Does the patient have difficulty dressing or bathing?: No Independently performs ADLs?: Yes (appropriate for developmental age) Does the patient have difficulty walking or climbing stairs?: No Weakness of Legs: None Weakness of Arms/Hands: None  Permission Sought/Granted Permission sought to share information with : Family Supports Permission granted to share information with : Yes, Verbal Permission Granted  Share Information with NAME: Amber Lopez     Permission granted to share info w Relationship: Daughter  Permission granted to share info w Contact Information: [phone]  Emotional Assessment Appearance:: Appears stated age Attitude/Demeanor/Rapport: Unable to Assess Affect (typically observed): Unable to Assess Orientation: : Oriented to Self Alcohol / Substance Use: Not Applicable Psych Involvement: No (comment)  Admission diagnosis:  Stroke [facility]) [I63.9] Stroke (cerebrum) ([place]) [I63.9] Acute right MCA stroke ([place]) [I63.511] Cerebrovascular accident (CVA), unspecified mechanism ([place]) [I63.9] Middle cerebral artery embolism, right [I66.01] Patient Active Problem List   Diagnosis Date Noted  . Tracheostomy in place [facility])   . Ventilator dependent ([place])   . Endotracheal tube present   . Pleural effusion on left   . Acute respiratory failure with hypoxia ([place])   . Pressure injury of  skin 02/07/2020  . Palliative care by specialist   . Infiltrate noted on imaging study   . Adenocarcinoma ([place])   . Malnutrition of moderate degree 02/03/2020   . Adenocarcinoma of left lung ([place])   . Acute right MCA stroke ([place]) 01/29/2020  . Middle cerebral artery embolism, right 01/29/2020  . DNR (do not resuscitate) discussion 08/12/2019  . HIV disease ([place]) 03/03/2019  . History of breast cancer 03/01/2019  . Dementia with behavioral disturbance ([place]) 03/01/2019  . Arthritis 03/01/2019  . Essential hypertension 03/01/2019   PCP:  Billie Ruddy, MD Pharmacy:   [facility] [address] [place] [address] Phone: [phone] Fax: [phone]     Social Determinants of Health (SDOH) Interventions    Readmission Risk Interventions No flowsheet data found.

## 2020-02-27 NOTE — Progress Notes (Signed)
SLP Cancellation Note  Patient Details Name: Amber Lopez MRN: [id number] DOB: February 18, 1941   Cancelled treatment:       Reason Eval/Treat Not Completed: Patient at procedure or test/unavailable. Pt working with PT/OT. Will f/u   [name], Katherene Ponto 02/27/2020, 1:29 PM

## 2020-02-27 NOTE — Plan of Care (Signed)
  Problem: Education: Goal: Knowledge of General Education information will improve Description: Including pain rating scale, medication(s)/side effects and non-pharmacologic comfort measures Outcome: Progressing   Problem: Health Behavior/Discharge Planning: Goal: Ability to manage health-related needs will improve Outcome: Progressing   Problem: Clinical Measurements: Goal: Ability to maintain clinical measurements within normal limits will improve Outcome: Progressing Goal: Will remain free from infection Outcome: Progressing Goal: Diagnostic test results will improve Outcome: Progressing Goal: Respiratory complications will improve Outcome: Progressing Goal: Cardiovascular complication will be avoided Outcome: Progressing   Problem: Activity: Goal: Risk for activity intolerance will decrease Outcome: Progressing   Problem: Nutrition: Goal: Adequate nutrition will be maintained Outcome: Progressing   Problem: Coping: Goal: Level of anxiety will decrease Outcome: Progressing   Problem: Elimination: Goal: Will not experience complications related to bowel motility Outcome: Progressing Goal: Will not experience complications related to urinary retention Outcome: Progressing   Problem: Pain Managment: Goal: General experience of comfort will improve Outcome: Progressing   Problem: Safety: Goal: Ability to remain free from injury will improve Outcome: Progressing   Problem: Skin Integrity: Goal: Risk for impaired skin integrity will decrease Outcome: Progressing   Problem: Education: Goal: Knowledge of disease or condition will improve Outcome: Progressing Goal: Knowledge of secondary prevention will improve Outcome: Progressing Goal: Knowledge of patient specific risk factors addressed and post discharge goals established will improve Outcome: Progressing Goal: Individualized Educational Video(s) Outcome: Progressing   Problem: Coping: Goal: Will verbalize  positive feelings about self Outcome: Progressing Goal: Will identify appropriate support needs Outcome: Progressing   Problem: Health Behavior/Discharge Planning: Goal: Ability to manage health-related needs will improve Outcome: Progressing   Problem: Self-Care: Goal: Ability to participate in self-care as condition permits will improve Outcome: Progressing Goal: Verbalization of feelings and concerns over difficulty with self-care will improve Outcome: Progressing Goal: Ability to communicate needs accurately will improve Outcome: Progressing   Problem: Nutrition: Goal: Risk of aspiration will decrease Outcome: Progressing Goal: Dietary intake will improve Outcome: Progressing   Problem: Ischemic Stroke/TIA Tissue Perfusion: Goal: Complications of ischemic stroke/TIA will be minimized Outcome: Progressing   Problem: Education: Goal: Knowledge about tracheostomy care/management will improve Outcome: Progressing   Problem: Respiratory: Goal: Patent airway maintenance will improve Outcome: Progressing   Problem: Role Relationship: Goal: Ability to communicate will improve Outcome: Progressing

## 2020-02-27 NOTE — Progress Notes (Signed)
PROGRESS NOTE  [name]  DOB: December 03, 1940  PCP: Billie Ruddy, MD [phone]  DOA: 01/29/2020  LOS: 29 days   Chief complaint: Strokelike symptoms  Brief narrative: Amber Lopez is a 79 y.o. female who presented to the ED on 01/29/2020 with left hemiplegia, facial droop, aphasia with right eye deviation. PMH significant for HIV/AIDS, HTN, HLD, DM, Depression, Arthritis, Breast Cancer  CTA head and neck showed emergent LVO at the right M1 segment for which patient underwent TPA and revascularization by neuro IR. She was admitted to ICU post procedure on mechanical ventilation.  Over the next several days, patient's neurological status continued to remain poor.  She was not able to follow commands.  She was not able to be weaned off ventilator. 10/20 trach placed 10/29 PEG placed Transferred out to hospitalist service on 11/2.  Subjective: Patient was seen and examined this morning. Able to state her name and unable to follow any other command. Essentially no change in last 24 hours.  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 -Patient has left-sided hemineglect and left hemiparesis.  Physical therapy involved. -Continue secondary stroke management, Asa, crestor  Acute ventilatory dependent respiratory failure with hypoxemia failure to wean  Inability to clear secretions -now s/p trach, continuing trach collar -Continue aggressive pulmonary hygiene -Speech therapy evaluation.  MBS 11/2 done.  Dysphagia one diet recommended.  Use PMV with all oral intake.  Adenocarcinoma of the left lung with left pleural effusion  -CT of chest on admission showed persistent large left pleural effusion with near complete left lung collapse. Left apical mass measuring 3.5 x 3.3 cm suspicious for bronchogenic carcinoma -Oncology consultation obtained. -poor performance status precludes chemotherapy with metastatic  disease   Hypertension Sinus tachycardia -Heart rate and blood pressure improving after Coreg was started on 11/3.   HIV/AIDS -Continue home medications including: atovaquone, dolutegravir and descovy  Type 2 diabetes mellitus -A1c 6.5 on 10/7 -Currently on 15 units Levemir twice daily with sliding scale insulin. Recent Labs  Lab 02/26/20 2013 02/26/20 2356 02/27/20 0356 02/27/20 0811 02/27/20 1150  GLUCAP 134* 102* 100* 135* 152*   Chronic normocytic anemia -Stable.  No current evidence of bleeding Recent Labs    02/15/20 1630 02/15/20 1630 02/16/20 0611 02/16/20 0611 02/17/20 1300 02/17/20 1300 02/18/20 0335 02/24/20 0654  HGB 9.2*  --  9.0*  --  9.2*  --  8.1* 9.9*  MCV 94.9   < > 96.3   < > 94.2   < > 94.5 95.0   < > = values in this interval not displayed.   [place]  -Family has requested transfer to another facility when informed poor prognosis. Palliative care consult appreciated. -LTAC denied -Prior discussion with [facility] and no beds available. Currently, no clear indication for services requiring transfer to a tertiary or quaternary level of care  Mobility: PT involved Code Status:   Code Status: DNR  Nutritional status: Body mass index is 25.94 kg/m. Nutrition Problem: Moderate Malnutrition Etiology: chronic illness (HIV) Signs/Symptoms: mild muscle depletion, moderate muscle depletion, mild fat depletion, moderate fat depletion Diet Order            DIET - DYS 1 Room service appropriate? No; Fluid consistency: Thin  Diet effective now                DVT prophylaxis: enoxaparin (LOVENOX) injection 40 mg Start: 02/03/20 1000   Antimicrobials:  Completed the course of antibiotics  Fluid: None Consultants: Neurology, IR, critical care, oncology, Palliative care Family Communication:  None at bedside  Status is: Inpatient  Remains inpatient appropriate because:Unsafe d/c plan   Dispo: The patient is from: Home              Anticipated d/c is to:  SNF               Anticipated d/c date is: Whenever bed is available              Patient currently is medically stable for discharge but she is a new trach.  Needs SNF.  Infusions:  . sodium chloride 250 mL (02/11/20 2212)  . sodium chloride Stopped (02/09/20 0556)    Scheduled Meds: . aspirin  81 mg Per Tube Daily  . atovaquone  1,500 mg Per Tube Q breakfast  . carvedilol  3.125 mg Per Tube BID WC  . chlorhexidine  15 mL Mouth Rinse BID  . Chlorhexidine Gluconate Cloth  6 each Topical Daily  . docusate  100 mg Per Tube BID  . dolutegravir  50 mg Oral Daily  . emtricitabine-tenofovir AF  1 tablet Per Tube Daily  . enoxaparin (LOVENOX) injection  40 mg Subcutaneous Q24H  . feeding supplement  237 mL Oral BID BM  . feeding supplement (OSMOLITE 1.2 CAL)  1,000 mL Per Tube Q24H  . fiber  1 packet Per Tube BID  . influenza vaccine adjuvanted  0.5 mL Intramuscular Tomorrow-1000  . insulin aspart  0-15 Units Subcutaneous Q4H  . insulin detemir  15 Units Subcutaneous BID  . mouth rinse  15 mL Mouth Rinse q12n4p  . multivitamin  15 mL Per Tube Daily  . pantoprazole sodium  40 mg Per Tube QHS  . polyethylene glycol  17 g Per Tube Daily  . rosuvastatin  10 mg Per NG tube Daily    Antimicrobials: Anti-infectives (From admission, onward)   Start     Dose/Rate Route Frequency Ordered Stop   02/12/20 1000  vancomycin (VANCOREADY) IVPB 1250 mg/250 mL        1,250 mg 166.7 mL/hr over 90 Minutes Intravenous Every 24 hours 02/11/20 1509 02/18/20 1438   02/07/20 1310  vancomycin (VANCOREADY) IVPB 750 mg/150 mL  Status:  Discontinued        750 mg 150 mL/hr over 60 Minutes Intravenous Every 12 hours 02/07/20 1310 02/11/20 1509   02/06/20 0000  vancomycin (VANCOREADY) IVPB 500 mg/100 mL  Status:  Discontinued        500 mg 100 mL/hr over 60 Minutes Intravenous Every 12 hours 02/05/20 1141 02/07/20 1310   02/05/20 1300  ceFEPIme (MAXIPIME) 2 g in sodium chloride 0.9 % 100 mL IVPB        2  g 200 mL/hr over 30 Minutes Intravenous Every 12 hours 02/05/20 1127 02/11/20 2140   02/05/20 1300  atovaquone (MEPRON) 750 MG/5ML suspension 1,500 mg        1,500 mg Per Tube Daily with breakfast 02/05/20 1137     02/05/20 1230  vancomycin (VANCOCIN) IVPB 1000 mg/200 mL premix        1,000 mg 200 mL/hr over 60 Minutes Intravenous  Once 02/05/20 1127 02/05/20 1304   01/30/20 1000  dolutegravir (TIVICAY) tablet 50 mg        50 mg Oral Daily 01/30/20 0941     01/30/20 1000  emtricitabine-tenofovir AF (DESCOVY) 200-25 MG per tablet 1 tablet        1 tablet Per Tube  Daily 01/30/20 0941     01/29/20 1600  emtricitabine-tenofovir AF (DESCOVY) 200-25 MG per tablet 1 tablet  Status:  Discontinued        1 tablet Per Tube Daily 01/29/20 1437 01/29/20 1507   01/29/20 1600  dolutegravir (TIVICAY) tablet 50 mg  Status:  Discontinued        50 mg Oral Daily 01/29/20 1437 01/29/20 1507   01/29/20 1415  bictegravir-emtricitabine-tenofovir AF (BIKTARVY) 50-200-25 MG per tablet 1 tablet  Status:  Discontinued        1 tablet Oral Daily 01/29/20 1408 01/29/20 1437   01/29/20 1045  amoxicillin-clavulanate (AUGMENTIN) 875-125 MG per tablet 1 tablet  Status:  Discontinued        1 tablet Oral 2 times daily 01/29/20 1041 01/29/20 1510   01/29/20 1045  bictegravir-emtricitabine-tenofovir AF (BIKTARVY) 50-200-25 MG per tablet 1 tablet  Status:  Discontinued        1 tablet Oral Daily 01/29/20 1041 01/29/20 1042   01/29/20 0913  ceFAZolin (ANCEF) 2-4 GM/100ML-% IVPB       Note to Pharmacy: Tressie Stalker   : cabinet override      01/29/20 0913 01/29/20 2114      PRN meds: Place/Maintain arterial line **AND** sodium chloride, acetaminophen **OR** acetaminophen (TYLENOL) oral liquid 160 mg/5 mL **OR** acetaminophen, labetalol, senna-docusate   Objective: Vitals:   02/27/20 1125 02/27/20 1130  BP: (!) 140/59   Pulse:  90  Resp: 16   Temp: 98.5 F (36.9 C)   SpO2: 99%     Intake/Output Summary (Last 24  hours) at 02/27/2020 1519 Last data filed at 02/27/2020 0555 Gross per 24 hour  Intake 880 ml  Output 200 ml  Net 680 ml   Filed Weights   02/24/20 0443 02/26/20 0425 02/27/20 0357  Weight: 60.1 kg 57.4 kg 56.3 kg   Weight change: -1.1 kg Body mass index is 25.94 kg/m.   Physical Exam: General exam: Somnolent.  Not in physical distress Skin: No rashes, lesions or ulcers. HEENT: Has a tracheostomy tube anteriorly Lungs: Clear to auscultation bilaterally CVS: Sinus tachycardia, no murmur GI/Abd soft, nontender, nondistended, PEG tube site intact CNS: Awake, alert, responded to name,, unable to follow any other command. Unable to make any purposeful movement. Psychiatry: Somnolent Extremities: No pedal edema, no calf tenderness  Data Review: I have personally reviewed the laboratory data and studies available.  Recent Labs  Lab 02/24/20 0654  WBC 10.7*  HGB 9.9*  HCT 32.3*  MCV 95.0  PLT 646*   Recent Labs  Lab 02/24/20 0654  NA 136  K 4.8  CL 95*  CO2 30  GLUCOSE 155*  BUN 15  CREATININE 0.89  CALCIUM 8.9    F/u labs ordered.  Signed, Terrilee Croak, MD [facility] 02/27/2020

## 2020-02-27 NOTE — NC FL2 (Signed)
[place] LEVEL OF CARE SCREENING TOOL     IDENTIFICATION  [name] Birthdate: Jul 12, 1940 Sex: female Admission Date (Current Location): 01/29/2020  [facility] and [place] Number:  Herbalist and Address:  [facility], [place] [address] [place], [place] [address]      Provider Number: [id number]  Attending Physician Name and Address:  Terrilee Croak, MD  Relative Name and Phone Number:  [name] Wollschlager, [facility] Recommended Level of Care: Vent SNF Prior Approval Number:    Date Approved/Denied:   PASRR Number: [id number]  Discharge Plan: SNF    Current Diagnoses: Patient Active Problem List   Diagnosis Date Noted  . Tracheostomy in place [facility])   . Ventilator dependent ([place])   . Endotracheal tube present   . Pleural effusion on left   . Acute respiratory failure with hypoxia ([place])   . Pressure injury of skin 02/07/2020  . Palliative care by specialist   . Infiltrate noted on imaging study   . Adenocarcinoma ([place])   . Malnutrition of moderate degree 02/03/2020  . Adenocarcinoma of left lung ([place])   . Acute right MCA stroke ([place]) 01/29/2020  . Middle cerebral artery embolism, right 01/29/2020  . DNR (do not resuscitate) discussion 08/12/2019  . HIV disease ([place]) 03/03/2019  . History of breast cancer 03/01/2019  . Dementia with behavioral disturbance ([place]) 03/01/2019  . Arthritis 03/01/2019  . Essential hypertension 03/01/2019    Orientation RESPIRATION BLADDER Height & Weight     Self (Patient currently trached, mouthing responses but orientation is unclear)  Tracheostomy Incontinent, External catheter Weight: 124 lb 1.9 oz (56.3 kg) Height:  4\' 10"  (147.3 cm)  BEHAVIORAL SYMPTOMS/MOOD NEUROLOGICAL BOWEL NUTRITION STATUS      Incontinent Diet (See discharge summary)  AMBULATORY STATUS COMMUNICATION OF NEEDS Skin   Extensive Assist Non-Verbally Normal, Other  (Comment) (RN states some skin breakdown near trach site)                       Personal Care Assistance Level of Assistance  Bathing, Feeding, Dressing Bathing Assistance: Maximum assistance Feeding assistance: Maximum assistance (Currently has feeding tube) Dressing Assistance: Maximum assistance     Functional Limitations Info  Sight, Hearing, Speech Sight Info: Adequate Hearing Info: Adequate Speech Info: Impaired    SPECIAL CARE FACTORS FREQUENCY  PT (By licensed PT), OT (By licensed OT)     PT Frequency: 5x weekly OT Frequency: 5x weekly            Contractures Contractures Info: Not present    Additional Factors Info  Allergies, Code Status Code Status Info: DNR Allergies Info: Sulfa Antibiotics           Current Medications (02/27/2020):  This is the current hospital active medication list Current Facility-Administered Medications  Medication Dose Route Frequency Provider Last Rate Last Admin  . 0.9 %  sodium chloride infusion  250 mL Intravenous Continuous Ollis, Brandi L, NP 10 mL/hr at 02/11/20 2212 250 mL at 02/11/20 2212  . 0.9 %  sodium chloride infusion   Intra-arterial PRN Gleason, Otilio Carpen, PA-C   Paused at 02/09/20 0556  . acetaminophen (TYLENOL) tablet 650 mg  650 mg Oral Q4H PRN Kerney Elbe, MD   650 mg at 02/07/20 2103   Or  . acetaminophen (TYLENOL) 160 MG/5ML solution 650 mg  650 mg Per Tube Q4H PRN Lindzen,  Randall Hiss, MD   650 mg at 02/23/20 1733   Or  . acetaminophen (TYLENOL) suppository 650 mg  650 mg Rectal Q4H PRN Kerney Elbe, MD      . aspirin chewable tablet 81 mg  81 mg Per Tube Daily Rosalin Hawking, MD   81 mg at 02/27/20 1022  . atovaquone (MEPRON) 750 MG/5ML suspension 1,500 mg  1,500 mg Per Tube Q breakfast Audria Nine, DO   1,500 mg at 02/27/20 1023  . carvedilol (COREG) tablet 3.125 mg  3.125 mg Per Tube BID WC Hammons, Kimberly B, RPH   3.125 mg at 02/27/20 1022  . chlorhexidine (PERIDEX) 0.12 % solution 15 mL  15 mL Mouth  Rinse BID Audria Nine, DO   15 mL at 02/27/20 1024  . Chlorhexidine Gluconate Cloth 2 % PADS 6 each  6 each Topical Daily Kerney Elbe, MD   6 each at 02/27/20 1025  . docusate (COLACE) 50 MG/5ML liquid 100 mg  100 mg Per Tube BID Rosalin Hawking, MD   100 mg at 02/27/20 1022  . dolutegravir (TIVICAY) tablet 50 mg  50 mg Oral Daily Garvin Fila, MD   50 mg at 02/27/20 1024  . emtricitabine-tenofovir AF (DESCOVY) 200-25 MG per tablet 1 tablet  1 tablet Per Tube Daily Jose Persia, MD   1 tablet at 02/27/20 1024  . enoxaparin (LOVENOX) injection 40 mg  40 mg Subcutaneous Q24H Simonne Maffucci B, MD   40 mg at 02/27/20 1028  . feeding supplement (ENSURE ENLIVE / ENSURE PLUS) liquid 237 mL  237 mL Oral BID BM Dahal, Binaya, MD   237 mL at 02/27/20 1340  . feeding supplement (OSMOLITE 1.2 CAL) liquid 1,000 mL  1,000 mL Per Tube Q24H Dahal, Marlowe Aschoff, MD 35 mL/hr at 02/26/20 1620 1,000 mL at 02/26/20 1620  . fiber (NUTRISOURCE FIBER) 1 packet  1 packet Per Tube BID Olalere, Adewale A, MD   1 packet at 02/27/20 1024  . influenza vaccine adjuvanted (FLUAD) injection 0.5 mL  0.5 mL Intramuscular Tomorrow-1000 Sampson Goon, MD      . insulin aspart (novoLOG) injection 0-15 Units  0-15 Units Subcutaneous Q4H Gleason, Otilio Carpen, PA-C   3 Units at 02/27/20 1339  . insulin detemir (LEVEMIR) injection 15 Units  15 Units Subcutaneous BID Jennelle Human B, NP   15 Units at 02/27/20 1022  . labetalol (NORMODYNE) injection 5 mg  5 mg Intravenous Q2H PRN Sampson Goon, MD      . MEDLINE mouth rinse  15 mL Mouth Rinse q12n4p Audria Nine, DO   15 mL at 02/27/20 1339  . multivitamin liquid 15 mL  15 mL Per Tube Daily Hammons, Kimberly B, RPH   15 mL at 02/27/20 1024  . pantoprazole sodium (PROTONIX) 40 mg/20 mL oral suspension 40 mg  40 mg Per Tube QHS Garvin Fila, MD   40 mg at 02/26/20 2102  . polyethylene glycol (MIRALAX / GLYCOLAX) packet 17 g  17 g Per Tube Daily Rosalin Hawking, MD   17 g at 02/27/20  1028  . rosuvastatin (CRESTOR) tablet 10 mg  10 mg Per NG tube Daily Sampson Goon, MD   10 mg at 02/27/20 1022  . senna-docusate (Senokot-S) tablet 1 tablet  1 tablet Per Tube QHS PRN Hunsucker, Bonna Gains, MD         Discharge Medications: Please see discharge summary for a list of discharge medications.  Relevant Imaging Results:  Relevant Lab Results:  Additional Information SSN: [id number]  [place]alee [place], [place]

## 2020-02-28 DIAGNOSIS — I63511 Cerebral infarction due to unspecified occlusion or stenosis of right middle cerebral artery: Secondary | ICD-10-CM | POA: Diagnosis not present

## 2020-02-28 LAB — GLUCOSE, CAPILLARY
Glucose-Capillary: 101 mg/dL — ABNORMAL HIGH (ref 70–99)
Glucose-Capillary: 105 mg/dL — ABNORMAL HIGH (ref 70–99)
Glucose-Capillary: 105 mg/dL — ABNORMAL HIGH (ref 70–99)
Glucose-Capillary: 127 mg/dL — ABNORMAL HIGH (ref 70–99)
Glucose-Capillary: 163 mg/dL — ABNORMAL HIGH (ref 70–99)
Glucose-Capillary: 165 mg/dL — ABNORMAL HIGH (ref 70–99)
Glucose-Capillary: 217 mg/dL — ABNORMAL HIGH (ref 70–99)
Glucose-Capillary: 227 mg/dL — ABNORMAL HIGH (ref 70–99)
Glucose-Capillary: 59 mg/dL — ABNORMAL LOW (ref 70–99)

## 2020-02-28 MED ORDER — DEXTROSE 50 % IV SOLN
INTRAVENOUS | Status: AC
  Administered 2020-02-28: 50 mL
  Filled 2020-02-28: qty 50

## 2020-02-28 NOTE — TOC Progression Note (Signed)
Transition of Care [facility]) - Progression Note    Patient Details  Name: Amber Lopez MRN: [id number] Date of Birth: 1940/05/21  Transition of Care [facility]) CM/SW [place], [place] Phone Number: 02/28/2020, 1:31 PM  Clinical Narrative:    CSW contacted the facilities to confirm their acceptances and both declined due to the pt having a cuffed trach. [facility] will reconsider if pt can get an uncuffed trach. Doctors will re evaluate whether she is stable enough to get an uncuffed trach on Monday. CSW attempted to call daughter, [name], message left.   Expected Discharge Plan: [place] Barriers to Discharge: Continued Medical Work up, SNF Pending bed offer  Expected Discharge Plan and Services Expected Discharge Plan: [place] In-house Referral: Clinical Social Work Discharge Planning Services: CM Consult Post Acute Care Choice: [place] arrangements for the past 2 months: Single Family Home                                       Social Determinants of Health (SDOH) Interventions    Readmission Risk Interventions No flowsheet data found.

## 2020-02-28 NOTE — Progress Notes (Signed)
PROGRESS NOTE  Amber Lopez  DOB: [birth year]  PCP: Billie Ruddy, MD [phone]  DOA: 01/29/2020  LOS: 30 days   Chief complaint: Strokelike symptoms  Brief narrative: Amber Lopez is a 79 y.o. female who presented to the ED on 01/29/2020 with left hemiplegia, facial droop, aphasia with right eye deviation. PMH significant for HIV/AIDS, HTN, HLD, DM, Depression, Arthritis, Breast Cancer  CTA head and neck showed emergent LVO at the right M1 segment for which patient underwent TPA and revascularization by neuro IR. She was admitted to ICU post procedure on mechanical ventilation.  Over the next several days, patient's neurological status continued to remain poor.  She was not able to follow commands.  She was not able to be weaned off ventilator. 10/20 trach placed 10/29 PEG placed Transferred out to hospitalist service on 11/2.  Subjective: Patient was seen and examined this morning. Remains lethargic.  Opens eyes on verbal command but unable to follow command. Has Flexi-Seal draining a lot of liquid stool.  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 -Patient has left-sided hemineglect and left hemiparesis.  Physical therapy involved. -Continue secondary stroke management, Asa, crestor  Acute ventilatory dependent respiratory failure with hypoxemia failure to wean  Inability to clear secretions -now s/p trach, continuing trach collar -Continue aggressive pulmonary hygiene -Speech therapy evaluation.  MBS 11/2 done.  Dysphagia one diet recommended.  Use PMV with all oral intake.  Adenocarcinoma of the left lung with left pleural effusion  -CT of chest on admission showed persistent large left pleural effusion with near complete left lung collapse. Left apical mass measuring 3.5 x 3.3 cm suspicious for bronchogenic carcinoma -Oncology consultation obtained. -poor performance status precludes  chemotherapy with metastatic disease   Acute diarrhea -Probably related to use of scheduled laxative, fiber and Senokot.  Stop these medications.  Flexi-Seal on.  Continue to monitor output.  No evidence of infection at this time.  Hypertension Sinus tachycardia -Heart rate and blood pressure improving after Coreg was started on 11/3.   HIV/AIDS -Continue home medications including: atovaquone, dolutegravir and descovy  Type 2 diabetes mellitus -A1c 6.5 on 10/7 -Currently on 15 units Levemir twice daily with sliding scale insulin. Recent Labs  Lab 02/27/20 1523 02/27/20 2035 02/28/20 0001 02/28/20 0351 02/28/20 0811  GLUCAP 82 84 101* 105* 105*   Chronic normocytic anemia -Stable.  No current evidence of bleeding Recent Labs    02/15/20 1630 02/15/20 1630 02/16/20 0611 02/16/20 0611 02/17/20 1300 02/17/20 1300 02/18/20 0335 02/24/20 0654  HGB 9.2*  --  9.0*  --  9.2*  --  8.1* 9.9*  MCV 94.9   < > 96.3   < > 94.2   < > 94.5 95.0   < > = values in this interval not displayed.   [place]iss  -Family has requested transfer to another facility when informed poor prognosis. Palliative care consult appreciated. -LTAC denied -Prior discussion with [facility] and no beds available. Currently, no clear indication for services requiring transfer to a tertiary or quaternary level of care  Mobility: PT involved Code Status:   Code Status: DNR  Nutritional status: Body mass index is 27.05 kg/m. Nutrition Problem: Moderate Malnutrition Etiology: chronic illness (HIV) Signs/Symptoms: mild muscle depletion, moderate muscle depletion, mild fat depletion, moderate fat depletion Diet Order            DIET - DYS 1 Room service appropriate? No; Fluid consistency: Thin  Diet effective  now                DVT prophylaxis: enoxaparin (LOVENOX) injection 40 mg Start: 02/03/20 1000   Antimicrobials:  Completed the course of antibiotics Fluid: None Consultants: Neurology, IR, critical care,  oncology, Palliative care Family Communication:  None at bedside  Status is: Inpatient  Remains inpatient appropriate because:Unsafe d/c plan   Dispo: The patient is from: Home              Anticipated d/c is to: SNF               Anticipated d/c date is: Whenever bed is available              Patient currently is medically stable for discharge but she is a new trach.  Needs SNF.  Infusions:  . sodium chloride 250 mL (02/11/20 2212)  . sodium chloride Stopped (02/09/20 0556)    Scheduled Meds: . aspirin  81 mg Per Tube Daily  . atovaquone  1,500 mg Per Tube Q breakfast  . carvedilol  3.125 mg Per Tube BID WC  . chlorhexidine  15 mL Mouth Rinse BID  . Chlorhexidine Gluconate Cloth  6 each Topical Daily  . docusate  100 mg Per Tube BID  . dolutegravir  50 mg Oral Daily  . emtricitabine-tenofovir AF  1 tablet Per Tube Daily  . enoxaparin (LOVENOX) injection  40 mg Subcutaneous Q24H  . feeding supplement  237 mL Oral BID BM  . feeding supplement (OSMOLITE 1.2 CAL)  1,000 mL Per Tube Q24H  . fiber  1 packet Per Tube BID  . influenza vaccine adjuvanted  0.5 mL Intramuscular Tomorrow-1000  . insulin aspart  0-15 Units Subcutaneous Q4H  . insulin detemir  15 Units Subcutaneous BID  . mouth rinse  15 mL Mouth Rinse q12n4p  . multivitamin  15 mL Per Tube Daily  . pantoprazole sodium  40 mg Per Tube QHS  . polyethylene glycol  17 g Per Tube Daily  . rosuvastatin  10 mg Per NG tube Daily    Antimicrobials: Anti-infectives (From admission, onward)   Start     Dose/Rate Route Frequency Ordered Stop   02/12/20 1000  vancomycin (VANCOREADY) IVPB 1250 mg/250 mL        1,250 mg 166.7 mL/hr over 90 Minutes Intravenous Every 24 hours 02/11/20 1509 02/18/20 1438   02/07/20 1310  vancomycin (VANCOREADY) IVPB 750 mg/150 mL  Status:  Discontinued        750 mg 150 mL/hr over 60 Minutes Intravenous Every 12 hours 02/07/20 1310 02/11/20 1509   02/06/20 0000  vancomycin (VANCOREADY) IVPB 500  mg/100 mL  Status:  Discontinued        500 mg 100 mL/hr over 60 Minutes Intravenous Every 12 hours 02/05/20 1141 02/07/20 1310   02/05/20 1300  ceFEPIme (MAXIPIME) 2 g in sodium chloride 0.9 % 100 mL IVPB        2 g 200 mL/hr over 30 Minutes Intravenous Every 12 hours 02/05/20 1127 02/11/20 2140   02/05/20 1300  atovaquone (MEPRON) 750 MG/5ML suspension 1,500 mg        1,500 mg Per Tube Daily with breakfast 02/05/20 1137     02/05/20 1230  vancomycin (VANCOCIN) IVPB 1000 mg/200 mL premix        1,000 mg 200 mL/hr over 60 Minutes Intravenous  Once 02/05/20 1127 02/05/20 1304   01/30/20 1000  dolutegravir (TIVICAY) tablet 50 mg  50 mg Oral Daily 01/30/20 0941     01/30/20 1000  emtricitabine-tenofovir AF (DESCOVY) 200-25 MG per tablet 1 tablet        1 tablet Per Tube Daily 01/30/20 0941     01/29/20 1600  emtricitabine-tenofovir AF (DESCOVY) 200-25 MG per tablet 1 tablet  Status:  Discontinued        1 tablet Per Tube Daily 01/29/20 1437 01/29/20 1507   01/29/20 1600  dolutegravir (TIVICAY) tablet 50 mg  Status:  Discontinued        50 mg Oral Daily 01/29/20 1437 01/29/20 1507   01/29/20 1415  bictegravir-emtricitabine-tenofovir AF (BIKTARVY) 50-200-25 MG per tablet 1 tablet  Status:  Discontinued        1 tablet Oral Daily 01/29/20 1408 01/29/20 1437   01/29/20 1045  amoxicillin-clavulanate (AUGMENTIN) 875-125 MG per tablet 1 tablet  Status:  Discontinued        1 tablet Oral 2 times daily 01/29/20 1041 01/29/20 1510   01/29/20 1045  bictegravir-emtricitabine-tenofovir AF (BIKTARVY) 50-200-25 MG per tablet 1 tablet  Status:  Discontinued        1 tablet Oral Daily 01/29/20 1041 01/29/20 1042   01/29/20 0913  ceFAZolin (ANCEF) 2-4 GM/100ML-% IVPB       Note to Pharmacy: Tressie Stalker   : cabinet override      01/29/20 0913 01/29/20 2114      PRN meds: Place/Maintain arterial line **AND** sodium chloride, acetaminophen **OR** acetaminophen (TYLENOL) oral liquid 160 mg/5 mL **OR**  acetaminophen, labetalol, senna-docusate   Objective: Vitals:   02/28/20 0802 02/28/20 0931  BP: 129/63   Pulse:    Resp: 18 17  Temp: 98.3 F (36.8 C)   SpO2: 100% 97%    Intake/Output Summary (Last 24 hours) at 02/28/2020 1036 Last data filed at 02/27/2020 2100 Gross per 24 hour  Intake --  Output 300 ml  Net -300 ml   Filed Weights   02/26/20 0425 02/27/20 0357 02/28/20 0500  Weight: 57.4 kg 56.3 kg 58.7 kg   Weight change: 2.4 kg Body mass index is 27.05 kg/m.   Physical Exam: General exam: Looks lethargic today.  Not in physical distress Skin: No rashes, lesions or ulcers. HEENT: Has a tracheostomy tube anteriorly Lungs: Clear to auscultation bilaterally CVS: Sinus tachycardia, no murmur GI/Abd soft, nontender, nondistended, PEG tube site intact CNS: Lethargic.  Tries to open eyes on verbal command.  Unable to follow any other command  Psychiatry: Somnolent Extremities: No pedal edema, no calf tenderness  Data Review: I have personally reviewed the laboratory data and studies available.  Recent Labs  Lab 02/24/20 0654  WBC 10.7*  HGB 9.9*  HCT 32.3*  MCV 95.0  PLT 646*   Recent Labs  Lab 02/24/20 0654  NA 136  K 4.8  CL 95*  CO2 30  GLUCOSE 155*  BUN 15  CREATININE 0.89  CALCIUM 8.9    F/u labs ordered.  Signed, Terrilee Croak, MD [facility] 02/28/2020

## 2020-02-29 DIAGNOSIS — I63511 Cerebral infarction due to unspecified occlusion or stenosis of right middle cerebral artery: Secondary | ICD-10-CM | POA: Diagnosis not present

## 2020-02-29 LAB — GLUCOSE, CAPILLARY
Glucose-Capillary: 117 mg/dL — ABNORMAL HIGH (ref 70–99)
Glucose-Capillary: 124 mg/dL — ABNORMAL HIGH (ref 70–99)
Glucose-Capillary: 141 mg/dL — ABNORMAL HIGH (ref 70–99)
Glucose-Capillary: 146 mg/dL — ABNORMAL HIGH (ref 70–99)
Glucose-Capillary: 147 mg/dL — ABNORMAL HIGH (ref 70–99)
Glucose-Capillary: 176 mg/dL — ABNORMAL HIGH (ref 70–99)

## 2020-02-29 LAB — BASIC METABOLIC PANEL
Anion gap: 11 (ref 5–15)
BUN: 21 mg/dL (ref 8–23)
CO2: 26 mmol/L (ref 22–32)
Calcium: 9.1 mg/dL (ref 8.9–10.3)
Chloride: 98 mmol/L (ref 98–111)
Creatinine, Ser: 1.02 mg/dL — ABNORMAL HIGH (ref 0.44–1.00)
GFR, Estimated: 56 mL/min — ABNORMAL LOW (ref >60)
Glucose, Bld: 135 mg/dL — ABNORMAL HIGH (ref 70–99)
Potassium: 5.5 mmol/L — ABNORMAL HIGH (ref 3.5–5.1)
Sodium: 135 mmol/L (ref 135–145)

## 2020-02-29 LAB — CBC WITH DIFFERENTIAL/PLATELET
Abs Immature Granulocytes: 0.08 10*3/uL — ABNORMAL HIGH (ref 0.00–0.07)
Basophils Absolute: 0 10*3/uL (ref 0.0–0.1)
Basophils Relative: 0 %
Eosinophils Absolute: 0.4 10*3/uL (ref 0.0–0.5)
Eosinophils Relative: 3 %
HCT: 30.3 % — ABNORMAL LOW (ref 36.0–46.0)
Hemoglobin: 9.4 g/dL — ABNORMAL LOW (ref 12.0–15.0)
Immature Granulocytes: 1 %
Lymphocytes Relative: 10 %
Lymphs Abs: 1.1 10*3/uL (ref 0.7–4.0)
MCH: 29.3 pg (ref 26.0–34.0)
MCHC: 31 g/dL (ref 30.0–36.0)
MCV: 94.4 fL (ref 80.0–100.0)
Monocytes Absolute: 0.9 10*3/uL (ref 0.1–1.0)
Monocytes Relative: 9 %
Neutro Abs: 8.4 10*3/uL — ABNORMAL HIGH (ref 1.7–7.7)
Neutrophils Relative %: 77 %
Platelets: 568 10*3/uL — ABNORMAL HIGH (ref 150–400)
RBC: 3.21 MIL/uL — ABNORMAL LOW (ref 3.87–5.11)
RDW: 15.5 % (ref 11.5–15.5)
WBC: 10.8 10*3/uL — ABNORMAL HIGH (ref 4.0–10.5)
nRBC: 0 % (ref 0.0–0.2)

## 2020-02-29 LAB — MAGNESIUM: Magnesium: 2.1 mg/dL (ref 1.7–2.4)

## 2020-02-29 LAB — PHOSPHORUS: Phosphorus: 3.9 mg/dL (ref 2.5–4.6)

## 2020-02-29 NOTE — Progress Notes (Signed)
PROGRESS NOTE  Amber Lopez  DOB: 1941/01/28  PCP: Billie Ruddy, MD [phone]  DOA: 01/29/2020  LOS: 31 days   Chief complaint: Strokelike symptoms  Brief narrative: Amber Lopez is a 79 y.o. female who presented to the ED on 01/29/2020 with left hemiplegia, facial droop, aphasia with right eye deviation. PMH significant for HIV/AIDS, HTN, HLD, DM, Depression, Arthritis, Breast Cancer.  CTA head and neck showed emergent LVO at the right M1 segment for which patient underwent TPA and revascularization by neuro IR. She was admitted to ICU post procedure on mechanical ventilation.  Over the next several days, patient's neurological status continued to remain poor.  She was not able to follow commands.  She was not able to be weaned off ventilator. 10/20 trach placed 10/29 PEG placed Transferred out to hospitalist service on 11/2. Essentially no improvement in her neurological status.  Subjective: Patient was seen and examined this morning. Remains lethargic.  Tries to open eyes on verbal command.  Unable to follow any other command. On 5 L oxygen by trach Also has a Foley catheter, fecal bag.  Diarrhea seems to be improving.  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 -Patient has left-sided hemineglect and left hemiparesis.  Physical therapy involved. -Has trach and PEG. -Continue secondary stroke management, Asa, crestor  Acute ventilatory dependent respiratory failure with hypoxemia Inability to clear secretions -now s/p trach, continuing trach collar -Continue aggressive pulmonary hygiene -Speech therapy evaluation.  MBS 11/2 done.  Dysphagia one diet recommended.  Also has PEG tube.  -Use PMV with all oral intake.  Adenocarcinoma of the left lung with left pleural effusion  -CT of chest on admission showed persistent large left pleural effusion with near complete left lung collapse. Left  apical mass measuring 3.5 x 3.3 cm suspicious for bronchogenic carcinoma -Oncology consultation obtained. -poor performance status precludes chemotherapy with metastatic disease  -Patient will follow with oncology as an outpatient if clinically improved.  Acute diarrhea -11/6, noted to have lot of loose stool and fecal tube.  Probably related to use of scheduled laxative, fiber and Senokot.  Medicines were stopped.  Per RN, diarrhea is getting better. -Continue to monitor.  Hypertension Sinus tachycardia -Heart rate and blood pressure stable on Coreg.  HIV/AIDS -Continue home medications including: atovaquone, dolutegravir and descovy  Type 2 diabetes mellitus -A1c 6.5 on 10/7 -Currently on 15 units Levemir twice daily with sliding scale insulin. Recent Labs  Lab 02/28/20 1750 02/28/20 2100 02/28/20 2343 02/29/20 0429 02/29/20 0738  GLUCAP 227* 165* 127* 146* 141*   Chronic normocytic anemia -Stable.  No current evidence of bleeding Recent Labs    02/16/20 0611 02/16/20 0611 02/17/20 1300 02/17/20 1300 02/18/20 0335 02/18/20 0335 02/24/20 0654 02/29/20 0412  HGB 9.0*  --  9.2*  --  8.1*  --  9.9* 9.4*  MCV 96.3   < > 94.2   < > 94.5   < > 95.0 94.4   < > = values in this interval not displayed.   GOC  -Family had requested transfer to another facility when informed poor prognosis.  At this time no clinical reason to transfer to other facility.  Palliative care consult appreciated. -LTAC denied -Case management working to find up SNF  Mobility: PT involved Code Status:   Code Status: DNR  Nutritional status: Body mass index is 26.13 kg/m. Nutrition Problem: Moderate Malnutrition Etiology: chronic illness (HIV) Signs/Symptoms: mild muscle depletion, moderate  muscle depletion, mild fat depletion, moderate fat depletion Diet Order            DIET - DYS 1 Room service appropriate? No; Fluid consistency: Thin  Diet effective now                DVT  prophylaxis: enoxaparin (LOVENOX) injection 40 mg Start: 02/03/20 1000   Antimicrobials:  Completed the course of antibiotics Fluid: None Consultants: Neurology, IR, critical care, oncology, Palliative care Family Communication:  None at bedside  Status is: Inpatient  Remains inpatient appropriate because:Unsafe d/c plan   Dispo: The patient is from: Home              Anticipated d/c is to: SNF               Anticipated d/c date is: Whenever bed is available              Patient currently is medically stable for discharge but she is a new trach.  Needs SNF.  Infusions:  . sodium chloride 250 mL (02/11/20 2212)  . sodium chloride Stopped (02/09/20 0556)    Scheduled Meds: . aspirin  81 mg Per Tube Daily  . atovaquone  1,500 mg Per Tube Q breakfast  . carvedilol  3.125 mg Per Tube BID WC  . chlorhexidine  15 mL Mouth Rinse BID  . Chlorhexidine Gluconate Cloth  6 each Topical Daily  . docusate  100 mg Per Tube BID  . dolutegravir  50 mg Oral Daily  . emtricitabine-tenofovir AF  1 tablet Per Tube Daily  . enoxaparin (LOVENOX) injection  40 mg Subcutaneous Q24H  . feeding supplement  237 mL Oral BID BM  . feeding supplement (OSMOLITE 1.2 CAL)  1,000 mL Per Tube Q24H  . influenza vaccine adjuvanted  0.5 mL Intramuscular Tomorrow-1000  . insulin aspart  0-15 Units Subcutaneous Q4H  . insulin detemir  15 Units Subcutaneous BID  . mouth rinse  15 mL Mouth Rinse q12n4p  . multivitamin  15 mL Per Tube Daily  . pantoprazole sodium  40 mg Per Tube QHS  . rosuvastatin  10 mg Per NG tube Daily    Antimicrobials: Anti-infectives (From admission, onward)   Start     Dose/Rate Route Frequency Ordered Stop   02/12/20 1000  vancomycin (VANCOREADY) IVPB 1250 mg/250 mL        1,250 mg 166.7 mL/hr over 90 Minutes Intravenous Every 24 hours 02/11/20 1509 02/18/20 1438   02/07/20 1310  vancomycin (VANCOREADY) IVPB 750 mg/150 mL  Status:  Discontinued        750 mg 150 mL/hr over 60 Minutes  Intravenous Every 12 hours 02/07/20 1310 02/11/20 1509   02/06/20 0000  vancomycin (VANCOREADY) IVPB 500 mg/100 mL  Status:  Discontinued        500 mg 100 mL/hr over 60 Minutes Intravenous Every 12 hours 02/05/20 1141 02/07/20 1310   02/05/20 1300  ceFEPIme (MAXIPIME) 2 g in sodium chloride 0.9 % 100 mL IVPB        2 g 200 mL/hr over 30 Minutes Intravenous Every 12 hours 02/05/20 1127 02/11/20 2140   02/05/20 1300  atovaquone (MEPRON) 750 MG/5ML suspension 1,500 mg        1,500 mg Per Tube Daily with breakfast 02/05/20 1137     02/05/20 1230  vancomycin (VANCOCIN) IVPB 1000 mg/200 mL premix        1,000 mg 200 mL/hr over 60 Minutes Intravenous  Once 02/05/20 1127 02/05/20  1304   01/30/20 1000  dolutegravir (TIVICAY) tablet 50 mg        50 mg Oral Daily 01/30/20 0941     01/30/20 1000  emtricitabine-tenofovir AF (DESCOVY) 200-25 MG per tablet 1 tablet        1 tablet Per Tube Daily 01/30/20 0941     01/29/20 1600  emtricitabine-tenofovir AF (DESCOVY) 200-25 MG per tablet 1 tablet  Status:  Discontinued        1 tablet Per Tube Daily 01/29/20 1437 01/29/20 1507   01/29/20 1600  dolutegravir (TIVICAY) tablet 50 mg  Status:  Discontinued        50 mg Oral Daily 01/29/20 1437 01/29/20 1507   01/29/20 1415  bictegravir-emtricitabine-tenofovir AF (BIKTARVY) 50-200-25 MG per tablet 1 tablet  Status:  Discontinued        1 tablet Oral Daily 01/29/20 1408 01/29/20 1437   01/29/20 1045  amoxicillin-clavulanate (AUGMENTIN) 875-125 MG per tablet 1 tablet  Status:  Discontinued        1 tablet Oral 2 times daily 01/29/20 1041 01/29/20 1510   01/29/20 1045  bictegravir-emtricitabine-tenofovir AF (BIKTARVY) 50-200-25 MG per tablet 1 tablet  Status:  Discontinued        1 tablet Oral Daily 01/29/20 1041 01/29/20 1042   01/29/20 0913  ceFAZolin (ANCEF) 2-4 GM/100ML-% IVPB       Note to Pharmacy: Tressie Stalker   : cabinet override      01/29/20 0913 01/29/20 2114      PRN meds: Place/Maintain arterial  line **AND** sodium chloride, acetaminophen **OR** acetaminophen (TYLENOL) oral liquid 160 mg/5 mL **OR** acetaminophen, labetalol   Objective: Vitals:   02/29/20 0735 02/29/20 0833  BP: (!) 140/55   Pulse:    Resp: (!) 22 (!) 23  Temp: 97.6 F (36.4 C)   SpO2: 99% 97%    Intake/Output Summary (Last 24 hours) at 02/29/2020 1031 Last data filed at 02/28/2020 2000 Gross per 24 hour  Intake --  Output 850 ml  Net -850 ml   Filed Weights   02/27/20 0357 02/28/20 0500 02/29/20 0322  Weight: 56.3 kg 58.7 kg 56.7 kg   Weight change: -2 kg Body mass index is 26.13 kg/m.   Physical Exam: General exam: Looks lethargic, altered.  Not in physical distress Skin: No rashes, lesions or ulcers. HEENT: Has a tracheostomy tube anteriorly Lungs: Clear to auscultation bilaterally CVS: Regular rate and rhythm, no murmur GI/Abd soft, nontender, nondistended, PEG tube site intact CNS: Lethargic.  Tries to open eyes on verbal command.  Unable to follow any other command  Psychiatry: Somnolent Extremities: No pedal edema, no calf tenderness  Data Review: I have personally reviewed the laboratory data and studies available.  Recent Labs  Lab 02/24/20 0654 02/29/20 0412  WBC 10.7* 10.8*  NEUTROABS  --  8.4*  HGB 9.9* 9.4*  HCT 32.3* 30.3*  MCV 95.0 94.4  PLT 646* 568*   Recent Labs  Lab 02/24/20 0654 02/29/20 0412  NA 136 135  K 4.8 5.5*  CL 95* 98  CO2 30 26  GLUCOSE 155* 135*  BUN 15 21  CREATININE 0.89 1.02*  CALCIUM 8.9 9.1  MG  --  2.1  PHOS  --  3.9    F/u labs ordered.  Signed, Terrilee Croak, MD [facility] 02/29/2020

## 2020-03-01 DIAGNOSIS — J9601 Acute respiratory failure with hypoxia: Secondary | ICD-10-CM | POA: Diagnosis not present

## 2020-03-01 DIAGNOSIS — Z930 Tracheostomy status: Secondary | ICD-10-CM | POA: Diagnosis not present

## 2020-03-01 DIAGNOSIS — I63511 Cerebral infarction due to unspecified occlusion or stenosis of right middle cerebral artery: Secondary | ICD-10-CM | POA: Diagnosis not present

## 2020-03-01 LAB — GLUCOSE, CAPILLARY
Glucose-Capillary: 128 mg/dL — ABNORMAL HIGH (ref 70–99)
Glucose-Capillary: 125 mg/dL — ABNORMAL HIGH (ref 70–99)
Glucose-Capillary: 155 mg/dL — ABNORMAL HIGH (ref 70–99)
Glucose-Capillary: 123 mg/dL — ABNORMAL HIGH (ref 70–99)
Glucose-Capillary: 204 mg/dL — ABNORMAL HIGH (ref 70–99)
Glucose-Capillary: 111 mg/dL — ABNORMAL HIGH (ref 70–99)

## 2020-03-01 NOTE — Progress Notes (Signed)
  Speech Language Pathology Treatment: Dysphagia;[place] Speaking valve  Patient Details Name: LENORIA NARINE MRN: [id number] DOB: 1940-12-20 Today's Date: 03/01/2020 Time: 6712-4580 SLP Time Calculation (min) (ACUTE ONLY): 23 min  Assessment / Plan / Recommendation Clinical Impression  Pt was seen for PMV and dysphagia treatment. She was awake and alert, however, she was highly distractible and required mod-max multimodal cueing to sustain attention to the session. Pt tolerated placement of PMV for the duration of the session with stable vital signs. She was able to achieve clear voicing with mildly reduced vocal intensity that negatively impacted intelligibility at times. Pt participated in conversation with SLP, however, her speech contained lots of jargon and contained little relevant meaning. Recommend pt wear PMV during all waking hours. With PMV placed, pt was observed with thin liquid and puree. She continues to demonstrate decreased awareness of POs and mild anterior spillage, but no s/sx of aspiration were observed. SLP increased pt's awareness of feeding by engaging pt in hand-over-hand assistance and providing max verbal, visual, and tactile cues with POs. Pt would benefit from future sessions targeting attention during functional tasks like feeding. SLP will continue to f/u acutely.  HPI HPI: Patient is a 79 y.o. female with PMH: HIV, depression, DM, HTN, HLD, arthritis, breast cancer, who was admitted on 10/7, who presented with acute onset of left hemiplegia, left facial droop, right eye deviation and aphasia. MR Brain revealed multifocal infarct in the right MCA territory affecting cortex and striatum, mild petechial hemorrhage as well as small acute left cerebellar infarct and generalized brain atrophy. She was intubated from 10/7 through 10/20 when trach was placed. PEG placed on 10/29.  Most recent CXR on 10/28 revealed persistent left base collapse/consolidation with small left pleural  effusion. Patient has a Therapist, sports, cuffed and is tolerating trach collar at 8 Liters, 35 FiO2.      SLP Plan  Continue with current plan of care       Recommendations  Diet recommendations: Dysphagia 1 (puree);Thin liquid Liquids provided via: Cup;Straw Medication Administration: Via alternative means Supervision: Full supervision/cueing for compensatory strategies;Staff to assist with self feeding Compensations: Minimize environmental distractions;Monitor for anterior loss Postural Changes and/or Swallow Maneuvers: Seated upright 90 degrees      Patient may use Passy-Muir Speech Valve: During all waking hours (remove during sleep) PMSV Supervision: Intermittent MD: Please consider changing trach tube to : Smaller size;Cuffless         Oral Care Recommendations: Oral care BID Follow up Recommendations: Skilled Nursing facility SLP Visit Diagnosis: Dysphagia, oral phase (R13.11) Plan: Continue with current plan of care       GO                [name] 03/01/2020, 2:21 PM

## 2020-03-01 NOTE — Progress Notes (Signed)
Physical Therapy Treatment Patient Details Name: Amber Lopez MRN: [id number] DOB: [birth year] Today's Date: 03/01/2020    History of Present Illness 79 y.o. female with a PMHx of depression, HLD, HIV, HTN and DM presenting with acute onset of left hemiplegia, left facial droop, right eye deviation and aphasia. Pt received IV tPA and underwent R MCA revacularization on 01/29/2020. Pt also found to have large left pleural effusion with near complete L lung collapse and L apical mass. Chest tube placed on 01/31/2020. s/p trach on 02/11/20.     PT Comments    Focused today's session on decreasing pushing syndrome. Educated pt on pushing syndrome and cued/placed pt into R lateral lean onto R elbow with feet unsupported sitting EOB. Then progressed to R hand placed laterally to BOS. Midline orientation improved once L hand was also placed on bed. Pt tends to sit with her neck flexed laterally to the L, thus provided gentle passive stretches to her neck into R lateral flexion to also facilitate midline orientation in sitting. Will continue to follow acutely and recommend SNF upon d/c to address her deficits to maximize her independence and safety with functional mobility.    Follow Up Recommendations  SNF;Supervision/Assistance - 24 hour     Equipment Recommendations  Wheelchair (measurements PT);Wheelchair cushion (measurements PT);Hospital bed;3in1 (PT)    Recommendations for Other Services       Precautions / Restrictions Precautions Precautions: Fall Precaution Comments: trach and flexiseal Restrictions Weight Bearing Restrictions: No    Mobility  Bed Mobility Overal bed mobility: Needs Assistance Bed Mobility: Supine to Sit;Sit to Supine     Supine to sit: Max assist;HOB elevated Sit to supine: Total assist;HOB elevated   General bed mobility comments: Cued pt to bring LEs towards L EOB to transition supine to sit, requiring extra time and assistance to complete. Cued pt to reach  with R UE towards L EOB to come to sit, with min success.  Transfers                 General transfer comment: Deferred due to safety concerns as only 1 personnel during treatment this date  Ambulation/Gait                 Stairs             Wheelchair Mobility    Modified Rankin (Stroke Patients Only) Modified Rankin (Stroke Patients Only) Pre-Morbid Rankin Score: Slight disability Modified Rankin: Severe disability     Balance Overall balance assessment: Needs assistance Sitting-balance support: Bilateral upper extremity supported;Feet unsupported Sitting balance-Leahy Scale: Poor Sitting balance - Comments: Pt tends to push with R to her L, thus cued pt to lean laterally to R onto elbow with feet unsupported to break pushing, with mod success. Progressed to R hand placed lateral to pt's body and hand-over-hand cues to place L hand on bed, with improved midline noted. Min guard and intermittent minA/modA to maintain static sitting balance. Postural control: Left lateral lean                                  Cognition Arousal/Alertness: Awake/alert Behavior During Therapy: Flat affect Overall Cognitive Status: Impaired/Different from baseline Area of Impairment: Attention;Memory;Following commands;Safety/judgement;Awareness;Problem solving                   Current Attention Level: Focused Memory: Decreased short-term memory Following Commands: Follows one step commands with increased  time Safety/Judgement: Decreased awareness of safety;Decreased awareness of deficits   Problem Solving: Slow processing;Decreased initiation;Difficulty sequencing;Requires verbal cues;Requires tactile cues General Comments: Pt requires step-by-step directions to sequence transitions in bed and repeated cues to perform tasks. Pt abel to state name and DOB and that she was in a hospital but unable to provide date or exact location.      Exercises Other  Exercises Other Exercises: Passive gentle stretch applied to neck into R lateral flexion, 3x ~45-60 sec each Other Exercises: Passive stretch to L elbow into extension 2x ~30 sec    General Comments General comments (skin integrity, edema, etc.): 5 L/min 21%FiO2 via trach collar throughout      Pertinent Vitals/Pain Pain Assessment: No/denies pain Pain Intervention(s): Limited activity within patient's tolerance;Monitored during session    Home Living                      Prior Function            PT Goals (current goals can now be found in the care plan section) Acute Rehab PT Goals Patient Stated Goal: to get stronger PT Goal Formulation: With patient Time For Goal Achievement: 03/11/20 Potential to Achieve Goals: Fair Progress towards PT goals: Progressing toward goals    Frequency    Min 3X/week      PT Plan Current plan remains appropriate    Co-evaluation              AM-PAC PT "6 Clicks" Mobility   Outcome Measure  Help needed turning from your back to your side while in a flat bed without using bedrails?: A Lot Help needed moving from lying on your back to sitting on the side of a flat bed without using bedrails?: A Lot Help needed moving to and from a bed to a chair (including a wheelchair)?: Total Help needed standing up from a chair using your arms (e.g., wheelchair or bedside chair)?: Total Help needed to walk in hospital room?: Total Help needed climbing 3-5 steps with a railing? : Total 6 Click Score: 8    End of Session Equipment Utilized During Treatment: Oxygen Activity Tolerance: Patient limited by fatigue Patient left: in bed;with call bell/phone within reach;with bed alarm set   PT Visit Diagnosis: Other abnormalities of gait and mobility (R26.89);Muscle weakness (generalized) (M62.81);Hemiplegia and hemiparesis;Other symptoms and signs involving the nervous system (R29.898) Hemiplegia - Right/Left: Left Hemiplegia -  dominant/non-dominant: Non-dominant Hemiplegia - caused by: Cerebral infarction     Time: 5027-7412 PT Time Calculation (min) (ACUTE ONLY): 19 min  Charges:  $Neuromuscular Re-education: 8-22 mins                     Moishe Spice, PT, DPT Acute Rehabilitation Services  Pager: [phone] Office: [phone]    Orvan Falconer 03/01/2020, 2:43 PM

## 2020-03-01 NOTE — Progress Notes (Signed)
NAME:  Amber Lopez, MRN:  [id number], DOB:  May 17, 1940, LOS: 38 ADMISSION DATE:  01/29/2020, CONSULTATION DATE:  10/7 REFERRING MD:  Estanislado Pandy, CHIEF COMPLAINT:  Left Sided Weakness   Brief History   79 y/o F, with HIV, admitted 10/7 with LVO of R M1 s/p tPA, neuro IR revascularization.  Returned to ICU post procedure on mechanical ventilation. Additional finding of left superior sulcus mass and complex effusion.   Past Medical History  HIV/AIDS, HTN, HLD, DM, Depression , Arthritis, Breast Cancer   Significant Hospital Events   10/07 Admit with L hemiplegia, facial droop, aphasia with right eye deviation 10/11 Episode of worsened R eye deviation with tachycardia and HTN, concern for sz  10/20 trach placed 10/28 Failed PSV due to tachypnea.   10/29 PEG placed 10/30-10/31: placed on atc tolerated well.   Consults:  Neurology, IR  Oncology 10/13 Palliative care 10/13  Procedures:  ETT 10/7 >> 10/20 L Radial ALine 10/7 >>  out R Femoral Sheath 10/7 >> out Trach 10/20 >>  Significant Diagnostic Tests:  10/7 CT Head Code Stroke >> no acute finding, generalized atrophy  10/7 CTA Head/Neck >> emergent LVO at the right M1 segment, no visible embolic source, atherosclerosis without flow limiting stenosis or ulceration of major vessels, left superior sulcus mass with large complex pleural effusion, associated mediastinal adenopathy  10/7 CT ABD w/contrast >> Negative for hematoma 10/7 CT Chest w/contrast >> Persistent large left pleural effusion with near complete left lung collapse. Left apical mass measuring 3.5 x 3.3 cm suspicious for bronchogenic carcinoma 10/11 CT head >> no acute findings 10/11 EEG >> Continuous slow, generalized and maximal right frontotemporal region  Micro Data:  COVID 10/7 >> negative  Influenza A/B 10/7 >> negative Pleural fluid culture>>no growth three days 10/9 BCx2>> 1/2 with gram positives 10/7 MRSA screen>>negative 10/11 BC x 1 (from left PICC) >  staph hominis 1/2 10/14 trach asp >> few WBC, rare G+ cocci>> few stenotrophomonas maltophilia S to levaquin and bactrim  10/14 BC >> negative  Antimicrobials:  Cefazolin 10/7 x1 Dolutegravir/ emtricitabine/ tenofovir 10/8 >> Vanc 10/14 >> 10/27 Cefepime 10/14 > 10/20  Interim history/subjective:  No distress  Objective   Blood pressure 126/67, pulse 91, temperature 98.1 F (36.7 C), temperature source Axillary, resp. rate 20, height 4\' 10"  (1.473 m), weight 57 kg, SpO2 98 %.    FiO2 (%):  [21 %-28 %] 21 %   Intake/Output Summary (Last 24 hours) at 03/01/2020 1020 Last data filed at 02/29/2020 1949 Gross per 24 hour  Intake no documentation  Output 350 ml  Net -350 ml   Filed Weights   02/28/20 0500 02/29/20 0322 03/01/20 0500  Weight: 58.7 kg 56.7 kg 57 kg    Examination: General this is a 79 year old female lying in bed appears comfortable HENT NCAT 6 cuffed trach in place and mid-line. No discharge. No sig drainage Pulm clear; decreased bases; now on 21% ATC  Card RRR Neuro left side hemiparesis. 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  11/2 thru  11/7: working w/ PT/OT needing max assist w/ 2 people. Having liquid stool so flexiseal in place.   Plan Cont routine trach care Will change to cuffless  Not a candidate for decannulation   Best practice:  Per primary    Erick Colace ACNP-BC [place] Pager # [phone] OR # [phone] if no answer

## 2020-03-01 NOTE — Procedures (Signed)
Tracheostomy Change Note  Patient Details:   Name: Amber Lopez DOB: June 15, 1940 MRN: [id number]    Airway Documentation:     Evaluation  O2 sats: stable throughout Complications: No apparent complications Patient did tolerate procedure well. Bilateral Breath Sounds: Clear     RT along with RT Marina Gravel changed patient's #6 Shiley cuffed to #6 Shiley cuffless with no complications. Positive color change noted on ETCO2 detector. Vitals are stable and all supplies at bedside. RT will continue to monitor.   Gerilyn Stargell [name] 03/01/2020, 3:36 PM

## 2020-03-01 NOTE — Progress Notes (Signed)
PROGRESS NOTE  MEGANN EASTERWOOD  DOB: [birth year]  PCP: Billie Ruddy, MD [phone]  DOA: 01/29/2020  LOS: 32 days   Chief complaint: Strokelike symptoms  Brief narrative: JONIYAH MALLINGER is a 79 y.o. female who presented to the ED on 01/29/2020 with left hemiplegia, facial droop, aphasia with right eye deviation. PMH significant for HIV/AIDS, HTN, HLD, DM, Depression, Arthritis, Breast Cancer.  CTA head and neck showed emergent LVO at the right M1 segment for which patient underwent TPA and revascularization by neuro IR. She was admitted to ICU post procedure on mechanical ventilation.  Over the next several days, patient's neurological status continued to remain poor. She was not able to follow commands.  She was not able to be weaned off ventilator. 10/20 trach placed 10/29 PEG placed Transferred out to hospitalist service on 11/2. Essentially no improvement in her neurological status.  Subjective: Patient was seen and examined this morning. Remains lethargic.  Eyes open.  Moves her lips to answer some questions.  Can tell me her name. Knows she is in the hospital.  Has dense left hemiparesis. On 5 L oxygen by trach Also has a Foley catheter, fecal bag.   Fecal tube continues to show liquidy stool but per RN, it has been there for more than a day and needs to be changed.  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 -Patient has left-sided hemineglect and left hemiparesis.  Physical therapy involved. -Has trach and PEG. -Continue secondary stroke management, Asa, crestor  Acute ventilatory dependent respiratory failure with hypoxemia Inability to clear secretions -now s/p trach, continuing trach collar.  -11/8, PCCM switched track to cuffless -Continue aggressive pulmonary hygiene -Speech therapy evaluation. MBS 11/2 done. Dysphagia one diet recommended.  Also has PEG tube.  -Use PMV with all oral  intake.  Adenocarcinoma of the left lung with left pleural effusion  -CT of chest on admission showed persistent large left pleural effusion with near complete left lung collapse. Left apical mass measuring 3.5 x 3.3 cm suspicious for bronchogenic carcinoma -Oncology consultation obtained. -poor performance status precludes chemotherapy with metastatic disease  -Patient will follow with oncology as an outpatient if clinically improved.  Acute diarrhea -11/6, noted to have lot of loose stool and fecal tube.  Probably related to use of scheduled laxative, fiber and Senokot.  Medicines were stopped. Fecal tube is still in and is doing stool from before.  Per RN, diarrhea is slowing.  Tube needs to be changed -Continue to monitor.  Hypertension Sinus tachycardia -Heart rate and blood pressure stable on Coreg.  HIV/AIDS -Continue home medications including: atovaquone, dolutegravir and descovy  Type 2 diabetes mellitus -A1c 6.5 on 10/7. -Currently on 15 units Levemir twice daily with sliding scale insulin. Recent Labs  Lab 02/29/20 1621 02/29/20 1940 02/29/20 2347 03/01/20 0501 03/01/20 0730  GLUCAP 176* 124* 147* 128* 125*   Chronic normocytic anemia -Stable.  No current evidence of bleeding Recent Labs    02/16/20 0611 02/16/20 0611 02/17/20 1300 02/17/20 1300 02/18/20 0335 02/18/20 0335 02/24/20 0654 02/29/20 0412  HGB 9.0*  --  9.2*  --  8.1*  --  9.9* 9.4*  MCV 96.3   < > 94.2   < > 94.5   < > 95.0 94.4   < > = values in this interval not displayed.   GOC  -Family had requested transfer to another facility when informed poor prognosis.  At this time no clinical  reason to transfer to other facility.  Palliative care consult appreciated. -LTAC denied -Case management working to find up SNF  Mobility: PT involved Code Status:   Code Status: DNR  Nutritional status: Body mass index is 26.26 kg/m. Nutrition Problem: Moderate Malnutrition Etiology: chronic  illness (HIV) Signs/Symptoms: mild muscle depletion, moderate muscle depletion, mild fat depletion, moderate fat depletion Diet Order            DIET - DYS 1 Room service appropriate? No; Fluid consistency: Thin  Diet effective now                DVT prophylaxis: enoxaparin (LOVENOX) injection 40 mg Start: 02/03/20 1000   Antimicrobials:  Completed the course of antibiotics Fluid: None Consultants: Neurology, IR, critical care, oncology, Palliative care Family Communication:  None at bedside  Status is: Inpatient  Remains inpatient appropriate because:Unsafe d/c plan   Dispo: The patient is from: Home              Anticipated d/c is to: SNF               Anticipated d/c date is: Whenever bed is available              Patient currently is medically stable for discharge but she is a new trach.  Needs SNF.  Infusions:  . sodium chloride 250 mL (02/11/20 2212)  . sodium chloride Stopped (02/09/20 0556)    Scheduled Meds: . aspirin  81 mg Per Tube Daily  . atovaquone  1,500 mg Per Tube Q breakfast  . carvedilol  3.125 mg Per Tube BID WC  . chlorhexidine  15 mL Mouth Rinse BID  . Chlorhexidine Gluconate Cloth  6 each Topical Daily  . docusate  100 mg Per Tube BID  . dolutegravir  50 mg Oral Daily  . emtricitabine-tenofovir AF  1 tablet Per Tube Daily  . enoxaparin (LOVENOX) injection  40 mg Subcutaneous Q24H  . feeding supplement  237 mL Oral BID BM  . feeding supplement (OSMOLITE 1.2 CAL)  1,000 mL Per Tube Q24H  . influenza vaccine adjuvanted  0.5 mL Intramuscular Tomorrow-1000  . insulin aspart  0-15 Units Subcutaneous Q4H  . insulin detemir  15 Units Subcutaneous BID  . mouth rinse  15 mL Mouth Rinse q12n4p  . multivitamin  15 mL Per Tube Daily  . pantoprazole sodium  40 mg Per Tube QHS  . rosuvastatin  10 mg Per NG tube Daily    Antimicrobials: Anti-infectives (From admission, onward)   Start     Dose/Rate Route Frequency Ordered Stop   02/12/20 1000   vancomycin (VANCOREADY) IVPB 1250 mg/250 mL        1,250 mg 166.7 mL/hr over 90 Minutes Intravenous Every 24 hours 02/11/20 1509 02/18/20 1438   02/07/20 1310  vancomycin (VANCOREADY) IVPB 750 mg/150 mL  Status:  Discontinued        750 mg 150 mL/hr over 60 Minutes Intravenous Every 12 hours 02/07/20 1310 02/11/20 1509   02/06/20 0000  vancomycin (VANCOREADY) IVPB 500 mg/100 mL  Status:  Discontinued        500 mg 100 mL/hr over 60 Minutes Intravenous Every 12 hours 02/05/20 1141 02/07/20 1310   02/05/20 1300  ceFEPIme (MAXIPIME) 2 g in sodium chloride 0.9 % 100 mL IVPB        2 g 200 mL/hr over 30 Minutes Intravenous Every 12 hours 02/05/20 1127 02/11/20 2140   02/05/20 1300  atovaquone (MEPRON) 750 MG/5ML  suspension 1,500 mg        1,500 mg Per Tube Daily with breakfast 02/05/20 1137     02/05/20 1230  vancomycin (VANCOCIN) IVPB 1000 mg/200 mL premix        1,000 mg 200 mL/hr over 60 Minutes Intravenous  Once 02/05/20 1127 02/05/20 1304   01/30/20 1000  dolutegravir (TIVICAY) tablet 50 mg        50 mg Oral Daily 01/30/20 0941     01/30/20 1000  emtricitabine-tenofovir AF (DESCOVY) 200-25 MG per tablet 1 tablet        1 tablet Per Tube Daily 01/30/20 0941     01/29/20 1600  emtricitabine-tenofovir AF (DESCOVY) 200-25 MG per tablet 1 tablet  Status:  Discontinued        1 tablet Per Tube Daily 01/29/20 1437 01/29/20 1507   01/29/20 1600  dolutegravir (TIVICAY) tablet 50 mg  Status:  Discontinued        50 mg Oral Daily 01/29/20 1437 01/29/20 1507   01/29/20 1415  bictegravir-emtricitabine-tenofovir AF (BIKTARVY) 50-200-25 MG per tablet 1 tablet  Status:  Discontinued        1 tablet Oral Daily 01/29/20 1408 01/29/20 1437   01/29/20 1045  amoxicillin-clavulanate (AUGMENTIN) 875-125 MG per tablet 1 tablet  Status:  Discontinued        1 tablet Oral 2 times daily 01/29/20 1041 01/29/20 1510   01/29/20 1045  bictegravir-emtricitabine-tenofovir AF (BIKTARVY) 50-200-25 MG per tablet 1 tablet   Status:  Discontinued        1 tablet Oral Daily 01/29/20 1041 01/29/20 1042   01/29/20 0913  ceFAZolin (ANCEF) 2-4 GM/100ML-% IVPB       Note to Pharmacy: Tressie Stalker   : cabinet override      01/29/20 0913 01/29/20 2114      PRN meds: Place/Maintain arterial line **AND** sodium chloride, acetaminophen **OR** acetaminophen (TYLENOL) oral liquid 160 mg/5 mL **OR** acetaminophen, labetalol   Objective: Vitals:   03/01/20 0836 03/01/20 0840  BP: 129/67 126/67  Pulse: 90 91  Resp: 19 20  Temp:  98.1 F (36.7 C)  SpO2: 98%     Intake/Output Summary (Last 24 hours) at 03/01/2020 1046 Last data filed at 03/01/2020 0900 Gross per 24 hour  Intake 50 ml  Output 350 ml  Net -300 ml   Filed Weights   02/28/20 0500 02/29/20 0322 03/01/20 0500  Weight: 58.7 kg 56.7 kg 57 kg   Weight change: 0.3 kg Body mass index is 26.26 kg/m.   Physical Exam: General exam: Eyes open.  Not in physical distress Skin: No rashes, lesions or ulcers. HEENT: Has a tracheostomy tube anteriorly Lungs: Clear to auscultation bilaterally CVS: Regular rate and rhythm, no murmur GI/Abd soft, nontender, nondistended, PEG tube site intact CNS: Eyes open.  Can answer some questions.  Inconsistent ability to follow motor commands, Psychiatry: Mood appropriate Extremities: No pedal edema, no calf tenderness  Data Review: I have personally reviewed the laboratory data and studies available.  Recent Labs  Lab 02/24/20 0654 02/29/20 0412  WBC 10.7* 10.8*  NEUTROABS  --  8.4*  HGB 9.9* 9.4*  HCT 32.3* 30.3*  MCV 95.0 94.4  PLT 646* 568*   Recent Labs  Lab 02/24/20 0654 02/29/20 0412  NA 136 135  K 4.8 5.5*  CL 95* 98  CO2 30 26  GLUCOSE 155* 135*  BUN 15 21  CREATININE 0.89 1.02*  CALCIUM 8.9 9.1  MG  --  2.1  PHOS  --  3.9    F/u labs ordered.  Signed, Terrilee Croak, MD [facility] 03/01/2020

## 2020-03-02 DIAGNOSIS — I63511 Cerebral infarction due to unspecified occlusion or stenosis of right middle cerebral artery: Secondary | ICD-10-CM | POA: Diagnosis not present

## 2020-03-02 LAB — CBC WITH DIFFERENTIAL/PLATELET
Abs Immature Granulocytes: 0.07 10*3/uL (ref 0.00–0.07)
Basophils Absolute: 0.1 10*3/uL (ref 0.0–0.1)
Basophils Relative: 1 %
Eosinophils Absolute: 0.4 10*3/uL (ref 0.0–0.5)
Eosinophils Relative: 4 %
HCT: 29.2 % — ABNORMAL LOW (ref 36.0–46.0)
Hemoglobin: 9 g/dL — ABNORMAL LOW (ref 12.0–15.0)
Immature Granulocytes: 1 %
Lymphocytes Relative: 11 %
Lymphs Abs: 1.1 10*3/uL (ref 0.7–4.0)
MCH: 28.8 pg (ref 26.0–34.0)
MCHC: 30.8 g/dL (ref 30.0–36.0)
MCV: 93.6 fL (ref 80.0–100.0)
Monocytes Absolute: 0.9 10*3/uL (ref 0.1–1.0)
Monocytes Relative: 9 %
Neutro Abs: 8.2 10*3/uL — ABNORMAL HIGH (ref 1.7–7.7)
Neutrophils Relative %: 74 %
Platelets: 510 10*3/uL — ABNORMAL HIGH (ref 150–400)
RBC: 3.12 MIL/uL — ABNORMAL LOW (ref 3.87–5.11)
RDW: 15.2 % (ref 11.5–15.5)
WBC: 10.8 10*3/uL — ABNORMAL HIGH (ref 4.0–10.5)
nRBC: 0 % (ref 0.0–0.2)

## 2020-03-02 LAB — GLUCOSE, CAPILLARY
Glucose-Capillary: 121 mg/dL — ABNORMAL HIGH (ref 70–99)
Glucose-Capillary: 150 mg/dL — ABNORMAL HIGH (ref 70–99)
Glucose-Capillary: 103 mg/dL — ABNORMAL HIGH (ref 70–99)
Glucose-Capillary: 157 mg/dL — ABNORMAL HIGH (ref 70–99)
Glucose-Capillary: 132 mg/dL — ABNORMAL HIGH (ref 70–99)

## 2020-03-02 LAB — BASIC METABOLIC PANEL
Anion gap: 9 (ref 5–15)
BUN: 21 mg/dL (ref 8–23)
CO2: 27 mmol/L (ref 22–32)
Calcium: 8.9 mg/dL (ref 8.9–10.3)
Chloride: 99 mmol/L (ref 98–111)
Creatinine, Ser: 1.08 mg/dL — ABNORMAL HIGH (ref 0.44–1.00)
GFR, Estimated: 52 mL/min — ABNORMAL LOW (ref >60)
Glucose, Bld: 126 mg/dL — ABNORMAL HIGH (ref 70–99)
Potassium: 4.6 mmol/L (ref 3.5–5.1)
Sodium: 135 mmol/L (ref 135–145)

## 2020-03-02 MED ORDER — OSMOLITE 1.5 CAL PO LIQD
780.0000 mL | ORAL | Status: DC
  Filled 2020-03-02: qty 948
  Filled 2020-03-02: qty 1000

## 2020-03-02 MED ORDER — OSMOLITE 1.5 CAL PO LIQD
780.0000 mL | ORAL | Status: DC
  Administered 2020-03-02 – 2020-03-03 (×2): 780 mL
  Administered 2020-03-04: 787 mL
  Administered 2020-03-05 – 2020-03-08 (×4): 780 mL
  Filled 2020-03-02 (×6): qty 1000

## 2020-03-02 MED ORDER — OSMOLITE 1.5 CAL PO LIQD: 780.0000 mL | ORAL | Status: DC

## 2020-03-02 NOTE — Progress Notes (Signed)
PROGRESS NOTE  Amber Lopez  DOB: [birth year]  PCP: Billie Ruddy, MD [phone]  DOA: 01/29/2020  LOS: 33 days   Chief complaint: Strokelike symptoms  Brief narrative: Amber Lopez is a 79 y.o. female who presented to the ED on 01/29/2020 with left hemiplegia, facial droop, aphasia with right eye deviation. PMH significant for HIV/AIDS, HTN, HLD, DM, Depression, Arthritis, Breast Cancer.  CTA head and neck showed emergent LVO at the right M1 segment for which patient underwent TPA and revascularization by neuro IR. She was admitted to ICU post procedure on mechanical ventilation.  Over the next several days, patient's neurological status continued to remain poor. She was not able to follow commands.  She was not able to be weaned off ventilator. 10/20 trach placed 10/29 PEG placed Transferred out to hospitalist service on 11/2. Essentially no improvement in her neurological status. Pending placement  Subjective: Patient was seen and examined this morning. Remains lethargic.  Able to open eyes on verbal command. Unable to follow command.  Has dense left hemiparesis. On 5 L oxygen by trach Also has a Foley catheter, fecal bag.   Fecal tube continues to show liquidy stool   Assessment/Plan: 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 -Status post TPA and revascularization. -Patient has left-sided hemineglect and left hemiparesis.  Physical therapy involved. -Has trach and PEG. -Continue secondary stroke management, Asa, crestor  Acute ventilatory dependent respiratory failure with hypoxemia Inability to clear secretions -now s/p trach, continuing trach collar.  -11/8, PCCM switched track to cuffless -Continue aggressive pulmonary hygiene -Speech therapy evaluation. MBS 11/2 done. Dysphagia one diet recommended.  Also has PEG tube.  -Use PMV with all oral intake.  Adenocarcinoma of the left lung with left pleural effusion  -CT of chest on admission  showed persistent large left pleural effusion with near complete left lung collapse. Left apical mass measuring 3.5 x 3.3 cm suspicious for bronchogenic carcinoma -Oncology consultation obtained. -poor performance status precludes chemotherapy with metastatic disease  -Patient will follow with oncology as an outpatient if clinically improved.  Acute diarrhea -11/6, noted to have lot of loose stool and fecal tube.  Probably related to use of scheduled laxative, fiber and Senokot.  Laxatives stopped.  Fecal tube continues to show loose stool but it is unclear if this is fresh or the stool from before.  I asked RN to clearly document the status of diarrhea.  Hypertension -Heart rate and blood pressure stable on Coreg.  HIV/AIDS -Continue home medications including: atovaquone, dolutegravir and descovy  Type 2 diabetes mellitus -A1c 6.5 on 10/7. -Currently on 15 units Levemir twice daily with sliding scale insulin. Recent Labs  Lab 03/01/20 2325 03/02/20 0336 03/02/20 0807 03/02/20 1124 03/02/20 1617  GLUCAP 111* 121* 150* 103* 157*   Chronic normocytic anemia -Stable.  No current evidence of bleeding Recent Labs    02/17/20 1300 02/17/20 1300 02/18/20 0335 02/18/20 0335 02/24/20 0654 02/24/20 0654 02/29/20 0412 03/02/20 0313  HGB 9.2*  --  8.1*  --  9.9*  --  9.4* 9.0*  MCV 94.2   < > 94.5   < > 95.0   < > 94.4 93.6   < > = values in this interval not displayed.   GOC  -Family had requested transfer to another facility when informed poor prognosis.  At this time no clinical reason to transfer to other facility.  Palliative care consult appreciated. -LTAC denied -Case management working to find up SNF  Mobility:  PT involved Code Status:   Code Status: DNR  Nutritional status: Body mass index is 26.82 kg/m. Nutrition Problem: Moderate Malnutrition Etiology: chronic illness (HIV) Signs/Symptoms: mild muscle depletion, moderate muscle depletion, mild fat depletion,  moderate fat depletion Diet Order            DIET - DYS 1 Room service appropriate? No; Fluid consistency: Thin  Diet effective now                DVT prophylaxis: enoxaparin (LOVENOX) injection 40 mg Start: 02/03/20 1000   Antimicrobials:  Completed the course of antibiotics Fluid: None Consultants: Neurology, IR, critical care, oncology, Palliative care Family Communication:  None at bedside  Status is: Inpatient  Remains inpatient appropriate because:Unsafe d/c plan   Dispo: The patient is from: Home              Anticipated d/c is to: SNF               Anticipated d/c date is: Whenever bed is available              Patient currently is medically stable for discharge but she is a new trach.  Needs SNF.  Infusions:  . sodium chloride 250 mL (02/11/20 2212)  . sodium chloride Stopped (02/09/20 0556)    Scheduled Meds: . aspirin  81 mg Per Tube Daily  . atovaquone  1,500 mg Per Tube Q breakfast  . carvedilol  3.125 mg Per Tube BID WC  . chlorhexidine  15 mL Mouth Rinse BID  . Chlorhexidine Gluconate Cloth  6 each Topical Daily  . docusate  100 mg Per Tube BID  . dolutegravir  50 mg Oral Daily  . emtricitabine-tenofovir AF  1 tablet Per Tube Daily  . enoxaparin (LOVENOX) injection  40 mg Subcutaneous Q24H  . feeding supplement  237 mL Oral BID BM  . feeding supplement (OSMOLITE 1.2 CAL)  1,000 mL Per Tube Q24H  . influenza vaccine adjuvanted  0.5 mL Intramuscular Tomorrow-1000  . insulin aspart  0-15 Units Subcutaneous Q4H  . insulin detemir  15 Units Subcutaneous BID  . mouth rinse  15 mL Mouth Rinse q12n4p  . multivitamin  15 mL Per Tube Daily  . pantoprazole sodium  40 mg Per Tube QHS  . rosuvastatin  10 mg Per NG tube Daily    Antimicrobials: Anti-infectives (From admission, onward)   Start     Dose/Rate Route Frequency Ordered Stop   02/12/20 1000  vancomycin (VANCOREADY) IVPB 1250 mg/250 mL        1,250 mg 166.7 mL/hr over 90 Minutes Intravenous Every 24  hours 02/11/20 1509 02/18/20 1438   02/07/20 1310  vancomycin (VANCOREADY) IVPB 750 mg/150 mL  Status:  Discontinued        750 mg 150 mL/hr over 60 Minutes Intravenous Every 12 hours 02/07/20 1310 02/11/20 1509   02/06/20 0000  vancomycin (VANCOREADY) IVPB 500 mg/100 mL  Status:  Discontinued        500 mg 100 mL/hr over 60 Minutes Intravenous Every 12 hours 02/05/20 1141 02/07/20 1310   02/05/20 1300  ceFEPIme (MAXIPIME) 2 g in sodium chloride 0.9 % 100 mL IVPB        2 g 200 mL/hr over 30 Minutes Intravenous Every 12 hours 02/05/20 1127 02/11/20 2140   02/05/20 1300  atovaquone (MEPRON) 750 MG/5ML suspension 1,500 mg        1,500 mg Per Tube Daily with breakfast 02/05/20 1137  02/05/20 1230  vancomycin (VANCOCIN) IVPB 1000 mg/200 mL premix        1,000 mg 200 mL/hr over 60 Minutes Intravenous  Once 02/05/20 1127 02/05/20 1304   01/30/20 1000  dolutegravir (TIVICAY) tablet 50 mg        50 mg Oral Daily 01/30/20 0941     01/30/20 1000  emtricitabine-tenofovir AF (DESCOVY) 200-25 MG per tablet 1 tablet        1 tablet Per Tube Daily 01/30/20 0941     01/29/20 1600  emtricitabine-tenofovir AF (DESCOVY) 200-25 MG per tablet 1 tablet  Status:  Discontinued        1 tablet Per Tube Daily 01/29/20 1437 01/29/20 1507   01/29/20 1600  dolutegravir (TIVICAY) tablet 50 mg  Status:  Discontinued        50 mg Oral Daily 01/29/20 1437 01/29/20 1507   01/29/20 1415  bictegravir-emtricitabine-tenofovir AF (BIKTARVY) 50-200-25 MG per tablet 1 tablet  Status:  Discontinued        1 tablet Oral Daily 01/29/20 1408 01/29/20 1437   01/29/20 1045  amoxicillin-clavulanate (AUGMENTIN) 875-125 MG per tablet 1 tablet  Status:  Discontinued        1 tablet Oral 2 times daily 01/29/20 1041 01/29/20 1510   01/29/20 1045  bictegravir-emtricitabine-tenofovir AF (BIKTARVY) 50-200-25 MG per tablet 1 tablet  Status:  Discontinued        1 tablet Oral Daily 01/29/20 1041 01/29/20 1042   01/29/20 0913  ceFAZolin  (ANCEF) 2-4 GM/100ML-% IVPB       Note to Pharmacy: Tressie Stalker   : cabinet override      01/29/20 0913 01/29/20 2114      PRN meds: Place/Maintain arterial line **AND** sodium chloride, acetaminophen **OR** acetaminophen (TYLENOL) oral liquid 160 mg/5 mL **OR** acetaminophen, labetalol   Objective: Vitals:   03/02/20 1457 03/02/20 1510  BP:  (!) 142/45  Pulse: 84 78  Resp: (!) 26 19  Temp:  98.5 F (36.9 C)  SpO2:  100%    Intake/Output Summary (Last 24 hours) at 03/02/2020 1651 Last data filed at 03/02/2020 1212 Gross per 24 hour  Intake 120 ml  Output 2450 ml  Net -2330 ml   Filed Weights   02/29/20 0322 03/01/20 0500 03/02/20 0500  Weight: 56.7 kg 57 kg 58.2 kg   Weight change: 1.2 kg Body mass index is 26.82 kg/m.   Physical Exam: General exam: trach, PEG status. Not in physical distress  Skin: No rashes, lesions or ulcers. HEENT: Has a tracheostomy tube anteriorly Lungs: Clear to auscultation bilaterally CVS: Regular rate and rhythm, no murmur GI/Abd soft, nontender, nondistended, PEG tube site intact CNS: Opens eyes on verbal command.  Dense hemiplegia on the left.  Inconsistent ability to follow motor commands, Psychiatry: Mood appropriate Extremities: No pedal edema, no calf tenderness  Data Review: I have personally reviewed the laboratory data and studies available.  Recent Labs  Lab 02/29/20 0412 03/02/20 0313  WBC 10.8* 10.8*  NEUTROABS 8.4* 8.2*  HGB 9.4* 9.0*  HCT 30.3* 29.2*  MCV 94.4 93.6  PLT 568* 510*   Recent Labs  Lab 02/29/20 0412 03/02/20 0313  NA 135 135  K 5.5* 4.6  CL 98 99  CO2 26 27  GLUCOSE 135* 126*  BUN 21 21  CREATININE 1.02* 1.08*  CALCIUM 9.1 8.9  MG 2.1  --   PHOS 3.9  --     F/u labs ordered.  Signed, Terrilee Croak, MD [facility] 03/02/2020

## 2020-03-02 NOTE — Plan of Care (Signed)
  Problem: Elimination: Goal: Will not experience complications related to urinary retention Outcome: Progressing   Problem: Pain Managment: Goal: General experience of comfort will improve Outcome: Progressing   Problem: Safety: Goal: Ability to remain free from injury will improve Outcome: Progressing   

## 2020-03-02 NOTE — Progress Notes (Signed)
Occupational Therapy Treatment Patient Details Name: Amber Lopez MRN: [id number] DOB: 1940/11/19 Today's Date: 03/02/2020    History of present illness 79 y.o. female with a PMHx of depression, HLD, HIV, HTN and DM presenting with acute onset of left hemiplegia, left facial droop, right eye deviation and aphasia. Pt received IV tPA and underwent R MCA revacularization on 01/29/2020. Pt also found to have large left pleural effusion with near complete L lung collapse and L apical mass. Chest tube placed on 01/31/2020. s/p trach on 02/11/20.    OT comments  Pt making gradual progress towards OT goals this session. Upon OTA arrival, pt heavily leaning to pts L side looking very uncomfortable. Pt declined OOB mobility, therefore session focus on repositioning and bed mobility. Pt present with pain, decreased activity tolerance and generalized weakness impacting pts ability to complete BADLs. Pt currently requires total A +2 for all aspects of bed mobility with pt having a preference to lean to pts L sided. Placed towel behind pts L shoulder to position pt at midline. Pt would continue to benefit from skilled occupational therapy while admitted and after d/c to address the below listed limitations in order to improve overall functional mobility and facilitate independence with BADL participation. DC plan remains appropriate, will follow acutely per POC.     Follow Up Recommendations  SNF    Equipment Recommendations  Hospital bed;Wheelchair cushion (measurements OT);Wheelchair (measurements OT);Other (comment) (hoyer mattress overlay)    Recommendations for Other Services      Precautions / Restrictions Precautions Precautions: Fall Precaution Comments: trach and flexiseal Restrictions Weight Bearing Restrictions: No       Mobility Bed Mobility Overal bed mobility: Needs Assistance Bed Mobility: Rolling Rolling: Total assist;+2 for physical assistance         General bed mobility  comments: pt required total A +2 to roll to pts R side, pt unable to bend up L knee without total A, total A to maintain sidelying position for tech to place towel roll behind pts L shoulder  Transfers                 General transfer comment: pt declined    Balance                                           ADL either performed or assessed with clinical judgement   ADL Overall ADL's : Needs assistance/impaired                           Toilet Transfer Details (indicate cue type and reason): defer this session, session focus on positioning and bed mobility         Functional mobility during ADLs: +2 for physical assistance;Total assistance (bed mobility only) General ADL Comments: pt currently needing total A for ADLs and bed mobility     Vision       Perception     Praxis      Cognition Arousal/Alertness: Awake/alert   Overall Cognitive Status: Difficult to assess                                          Exercises Other Exercises Other Exercises: Passive gentle stretch applied to neck into R lateral flexion  Other Exercises: cervical rotation R   Shoulder Instructions       General Comments 5LPM 28%, O2 briefly dropped to 75% however rebounded quickly with PLB and increased time. unable to locate PMV- alerted RN.    Pertinent Vitals/ Pain       Pain Assessment: Faces Faces Pain Scale: Hurts a little bit Pain Location: generalized with movement Pain Descriptors / Indicators: Grimacing;Discomfort Pain Intervention(s): Monitored during session;Repositioned  Home Living                                          Prior Functioning/Environment              Frequency  Min 2X/week        Progress Toward Goals  OT Goals(current goals can now be found in the care plan section)  Progress towards OT goals: Progressing toward goals  Acute Rehab OT Goals Patient Stated Goal: to get  stronger OT Goal Formulation: With patient Time For Goal Achievement: 03/09/20 Potential to Achieve Goals: [place] Discharge plan remains appropriate;Frequency remains appropriate    Co-evaluation                 AM-PAC OT "6 Clicks" Daily Activity     Outcome Measure   Help from another person eating meals?: Total Help from another person taking care of personal grooming?: A Lot Help from another person toileting, which includes using toliet, bedpan, or urinal?: Total Help from another person bathing (including washing, rinsing, drying)?: Total Help from another person to put on and taking off regular upper body clothing?: Total Help from another person to put on and taking off regular lower body clothing?: Total 6 Click Score: 7    End of Session Equipment Utilized During Treatment: Oxygen;Other (comment) (5LPM 28%)  OT Visit Diagnosis: Hemiplegia and hemiparesis Hemiplegia - Right/Left: Left Hemiplegia - dominant/non-dominant: Non-Dominant Hemiplegia - caused by: Cerebral infarction   Activity Tolerance Patient tolerated treatment well   Patient Left in bed;with call bell/phone within reach;with bed alarm set   Nurse Communication Mobility status;Other (comment) (f/u about OTR ordering Prevalon boots, could not locate PMV)        Time: 1431-1447 OT Time Calculation (min): 16 min  Charges: OT General Charges $OT Visit: 1 Visit OT Treatments $Therapeutic Activity: 8-22 mins  Lanier Clam., COTA/L Acute Rehabilitation Services [phone]    Ihor Gully 03/02/2020, 3:23 PM

## 2020-03-02 NOTE — Progress Notes (Addendum)
Nutrition Follow-up  DOCUMENTATION CODES:   Non-severe (moderate) malnutrition in context of chronic illness  INTERVENTION:  Transition pt to nocturnal feeding via PEG: -Provide Osmolite 1.5 @ 44ml/hr from 1800-0600  Nocturnal TF regimen provides 1170 kcals (meets 70% minimum estimated needs), 48 grams of protein, 527ml free water  -Recommend consult to Diabetes Coordinator to discuss coverage for nocturnal TF.  -Continue Ensure Enlive po BID, each supplement provides 350 kcal and 20 grams of protein -Continue MVI daily  NUTRITION DIAGNOSIS:   Moderate Malnutrition related to chronic illness (HIV) as evidenced by mild muscle depletion, moderate muscle depletion, mild fat depletion, moderate fat depletion.  ongoing  GOAL:   Patient will meet greater than or equal to 90% of their needs  progressing  MONITOR:   Vent status, Weight trends, TF tolerance, Skin, Labs  REASON FOR ASSESSMENT:   Consult Enteral/tube feeding initiation and management  ASSESSMENT:   79 yo female presents with left sided weakness, facial droop, aphasia and right eye deviation and  admitted with ischemic stoke insetting of large vessel occlusion of right MCA M1 segment requiring revascularization, acute blood loss anemia with concern of retroperitoneal bleed, respiratory failure requiring intubation. PMH includes HIV, HTN, HLD, DM, depression, hx of breast cancer   10/07 - admitted, intubated, s/p revascularization of R MCA M1 occlusion 10/08 -CT chest with new mass consistent with malignant adenocarcinoma,RI multifocal infarct with mild petechial hemorrhage with small L cerebella infarct 10/09 - chest tube placed for large left pleural effusion 10/19 - GOC discussion, plan for trach/PEG 10/20 - trach, cortrak placed 10/28 failed PSV due to tachypnea 10/29 PEG placed 11/2 MBS, dysphagia 1 diet with thin liquids ordered 11/8 trach switched to cuffless  Pt continues on ATC. Per CCM, no plans to  decannulate pt yet. Discussed pt with RN. Pt with inadequate PO intake since last RD assessment -- meal completions charted as 15-75% x 7 recorded meals (37% average meal completion). Pt receiving Ensure Enlive BID which, per RN documentation, pt has accepted until this afternoon. Pt also receiving continuous TF via PEG at 70ml/hr. Will transition pt to nocturnal feeding in hopes of stimulating appetite throughout the day. Recommend consult to diabetes coordinator to discuss appropriate coverage for nocturnal TF.   Admit wt: 53.7 kg Current wt: 58.2 kg  UOP: 2335ml documented today  Stool: 164ml output today via rectal tube  Labs: CBGs 150-103-157 Medications: colace, ss novolog, 15 units levemir BID, MVI, protonix  Diet Order:   Diet Order            DIET - DYS 1 Room service appropriate? No; Fluid consistency: Thin  Diet effective now                 EDUCATION NEEDS:   Not appropriate for education at this time  Skin:  Skin Assessment: Skin Integrity Issues: Skin Integrity Issues:: Stage II Stage II: R perineum  Last BM:  11/9 155ml via rectal tube  Height:   Ht Readings from Last 1 Encounters:  01/29/20 4\' 10"  (1.473 m)    Weight:   Wt Readings from Last 1 Encounters:  03/02/20 58.2 kg    BMI:  Body mass index is 26.82 kg/m.  Estimated Nutritional Needs:   Kcal:  1650-1850 kcals  Protein:  75-90 g  Fluid:  >/= 1.7  L    Larkin Ina, MS, RD, LDN RD pager number and weekend/on-call pager number located in [place].

## 2020-03-03 ENCOUNTER — Inpatient Hospital Stay (HOSPITAL_COMMUNITY): Payer: Medicare Other

## 2020-03-03 DIAGNOSIS — E440 Moderate protein-calorie malnutrition: Secondary | ICD-10-CM | POA: Diagnosis not present

## 2020-03-03 DIAGNOSIS — J90 Pleural effusion, not elsewhere classified: Secondary | ICD-10-CM | POA: Diagnosis not present

## 2020-03-03 DIAGNOSIS — I63511 Cerebral infarction due to unspecified occlusion or stenosis of right middle cerebral artery: Secondary | ICD-10-CM | POA: Diagnosis not present

## 2020-03-03 DIAGNOSIS — Z930 Tracheostomy status: Secondary | ICD-10-CM | POA: Diagnosis not present

## 2020-03-03 LAB — GLUCOSE, CAPILLARY
Glucose-Capillary: 123 mg/dL — ABNORMAL HIGH (ref 70–99)
Glucose-Capillary: 162 mg/dL — ABNORMAL HIGH (ref 70–99)
Glucose-Capillary: 208 mg/dL — ABNORMAL HIGH (ref 70–99)
Glucose-Capillary: 220 mg/dL — ABNORMAL HIGH (ref 70–99)
Glucose-Capillary: 220 mg/dL — ABNORMAL HIGH (ref 70–99)
Glucose-Capillary: 62 mg/dL — ABNORMAL LOW (ref 70–99)
Glucose-Capillary: 87 mg/dL (ref 70–99)

## 2020-03-03 MED ORDER — DARUN-COBIC-EMTRICIT-TENOFAF 800-150-200-10 MG PO TABS
1.0000 | ORAL_TABLET | Freq: Every day | ORAL | Status: DC
  Administered 2020-03-04: 1 via ORAL
  Filled 2020-03-03: qty 1

## 2020-03-03 MED ORDER — LOPERAMIDE HCL 1 MG/7.5ML PO SUSP
2.0000 mg | Freq: Two times a day (BID) | ORAL | Status: DC
  Administered 2020-03-03 – 2020-03-09 (×12): 2 mg
  Filled 2020-03-03 (×13): qty 15

## 2020-03-03 NOTE — Progress Notes (Signed)
[facility] PROGRESS NOTE  JAVONNA BALLI TKZ:[id number] DOB: 1941/03/30 DOA: 01/29/2020 PCP: Billie Ruddy, MD  Status: Remains inpatient appropriate because:Altered mental status, Unsafe d/c plan and Trach placed this admission on 10/20 and needs to be in place 30 days before can be considered for SNF placement   Dispo: The patient is from: Home              Anticipated d/c is to: SNF              Anticipated d/c date is: > 3 days              Patient currently is medically stable to d/c.   Code Status: DNR Family Communication: No family at bedside today DVT prophylaxis: Lovenox Vaccination status: Patient completed both doses of Pfizer Covid vaccine on 09/01/2019  Foley catheter: No  HPI: 79 year old female with past medical history for HIV/AIDS, hypertension, dyslipidemia, diabetes, depression, arthritis and breast cancer.  Patient presented to the ER on 10/7 with strokelike symptoms consisting of left hemiplegia, facial droop, aphasia and right ideation.  CTA head neck showed emergent LVO at the right M1 segment with patient subsequently undergoing TPA and revascularization by neurology and IR.  She was admitted to the ICU postprocedure on mechanical ventilation.  Unfortunately patient's mentation and neurological status remained poor.  She was not able to follow commands and she was unable to wean from the ventilator.  Tracheostomy tube was placed on 10/20 and a PEG tube was placed on 10/29.  Unfortunately her neurological status has not improved.  Other complications since admission include a large left pleural effusion/left apical mass with pathology assistant with metastatic adenocarcinoma.  She has been denied for LTAC placement.  Palliative care has been discussing with patient family her poor prognosis and initially family requested patient be transferred to another facility but no clinical reason to transfer therefore patient remains.  Subjective: Awake with eyes open but  does not appear to focus on examiner during conversation.  Unable to follow commands.  Used the word cancer when discussing her current medical status but patient did not seem to have any awareness of understanding what this means.  Objective: Vitals:   03/03/20 1135 03/03/20 1155  BP:  (!) (P) 122/52  Pulse: 82 (P) 83  Resp:  (P) 16  Temp:  (P) 98.2 F (36.8 C)  SpO2:     No intake or output data in the 24 hours ending 03/03/20 1251 Filed Weights   03/01/20 0500 03/02/20 0500 03/03/20 0331  Weight: 57 kg 58.2 kg 58.6 kg    Exam:  Constitutional: NAD, calm, comfortable Respiratory: clear to auscultation bilaterally without, no wheezing, no crackles. Normal respiratory effort. No accessory muscle use. Changed to #6 cuffless trach on 11/8 Cardiovascular: Regular rate and rhythm, no murmurs / rubs / gallops. No extremity edema. 2+ pedal pulses. .  Abdomen: no obvious tenderness, no masses palpated. Bowel sounds positive.  G-tube in place with tube feeding infusing.  Flexi-Seal in place draining small to moderate amount of liquid brown stool to bedside bag Musculoskeletal: No joint deformity upper and lower extremities. Good passive ROM, no contractures.  No spasticity Skin: no rashes, lesions, ulcers. No induration Neurologic: CN 2-12 grossly intact. Sensation appears to be intact-able to adequately assess strength secondary to patient inability to follow simple commands or participate with exam Psychiatric: Awake with eyes open but given patient's inability to phonate or follow simple commands unable to accurately assess orientation  Assessment/Plan:  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 -Status post TPA and revascularization. -Patient has left-sided hemineglect and left hemiparesis and remains verbally nonresponsive and not following commands which apparently was not baseline mentation before stroke -PT/OT documents patient has been able to participate somewhat  during sessions-as a preference for leading towards left side and appearing uncomfortable.  She requires total assist for all aspects of bed mobility and currently is nonambulatory. -Continues to require tracheostomy tube for airway management and PEG tube for feedings -Continueprophylaxis with Diona Fanti, crestor  Acute ventilatory dependent respiratory failure with hypoxemia/Inability to clear secretions/requires tracheostomy tube -Respiratory status stable on the 21% FiO2 -11/8 transition to cuffless trach -Continue aggressive pulmonary hygiene -Speech therapy evaluation. MBS 11/2 done. Dysphagia one diet recommended but patient only tolerating small bites of food.  Also has PEG tube.  -Use PMV with all oral intake.  Adenocarcinoma of the left lung with left pleural effusion  -CT of chest on admission showed persistent large left pleural effusion with near complete left lung collapse. Left apical mass measuring 3.5 x 3.3 cm suspicious for bronchogenic carcinoma -Oral fluid pathology consistent with metastatic adenocarcinoma -poor performance status precludes chemotherapy with metastatic disease  -Patient will follow with oncology as an outpatient if improves clinically  Dysphagia/ Nutrition Status: Nutrition Problem: Moderate Malnutrition Etiology: chronic illness (HIV) Signs/Symptoms: mild muscle depletion, moderate muscle depletion, mild fat depletion, moderate fat depletion Interventions: Ensure Enlive (each supplement provides 350kcal and 20 grams of protein), Tube feeding, MVI  Acute diarrhea -Ongoing since 11/6 prompting discontinuation of laxative, fiber supplement (upon review of MAR 11/10 it appears Colace scheduled remained on medications therefore it was discontinued) -As of 11/10 since diarrhea persists and no obvious infectious etiology will begin scheduled Imodium with consideration to add Bentyl if necessary  Hypertension/left ventricular diastolic dysfunction -Heart rate  and blood pressure stable on Coreg. -No clinical signs consistent with heart failure  HIV/AIDS -Continue home medications including: atovaquone, dolutegravir and descovy; discussing with pharmacy and ID about transitioning to more affordable option since we are pursuing SNF care and expensive medications may be barrier to placement  Type 2 diabetes mellitus -A1c 6.5 on 10/7. -Currently on 15 units Levemir twice daily with sliding scale insulin.  Chronic normocytic anemia -Hemoglobin stable at 9.0  GOC  -Family had requested transfer to another facility when informed poor prognosis.  At this time no clinical reason to transfer to other facility.  Palliative care consult appreciated. -LTAC denied -Case management working to find up SNF  Pressure Injury 02/16/20 Perineum Right Stage 2 -  Partial thickness loss of dermis presenting as a shallow open injury with a red, pink wound bed without slough. 2cm x 1cm pink (Active)  Date First Assessed/Time First Assessed: 02/16/20 1200   Location: Perineum  Location Orientation: Right  Staging: Stage 2 -  Partial thickness loss of dermis presenting as a shallow open injury with a red, pink wound bed without slough.  Wound Descri...    Assessments 02/16/2020 12:00 PM 03/03/2020 12:20 PM  Dressing Type Moisture barrier --  Dressing -- Changed  Dressing Change Frequency -- Every 3 days  State of Healing -- Epithelialized  Site / Wound Assessment Clean;Pink Clean;Dry  Peri-wound Assessment Intact Intact  Wound Length (cm) 2 cm 1.5 cm  Wound Width (cm) 1 cm 1 cm  Wound Depth (cm) 0 cm 0 cm  Wound Surface Area (cm^2) 2 cm^2 1.5 cm^2  Wound Volume (cm^3) 0 cm^3 0 cm^3  Margins Unattached edges (unapproximated) Attached edges (approximated)  Drainage Amount Scant None  Treatment Cleansed --     No Linked orders to display      Data Reviewed: Basic Metabolic Panel: Recent Labs  Lab 02/29/20 0412 03/02/20 0313  NA 135 135  K 5.5* 4.6  CL  98 99  CO2 26 27  GLUCOSE 135* 126*  BUN 21 21  CREATININE 1.02* 1.08*  CALCIUM 9.1 8.9  MG 2.1  --   PHOS 3.9  --    Liver Function Tests: No results for input(s): AST, ALT, ALKPHOS, BILITOT, PROT, ALBUMIN in the last 168 hours. No results for input(s): LIPASE, AMYLASE in the last 168 hours. No results for input(s): AMMONIA in the last 168 hours. CBC: Recent Labs  Lab 02/29/20 0412 03/02/20 0313  WBC 10.8* 10.8*  NEUTROABS 8.4* 8.2*  HGB 9.4* 9.0*  HCT 30.3* 29.2*  MCV 94.4 93.6  PLT 568* 510*   Cardiac Enzymes: No results for input(s): CKTOTAL, CKMB, CKMBINDEX, TROPONINI in the last 168 hours. BNP (last 3 results) No results for input(s): BNP in the last 8760 hours.  ProBNP (last 3 results) Recent Labs    08/07/19 1422  PROBNP 110.0*    CBG: Recent Labs  Lab 03/02/20 2016 03/03/20 0020 03/03/20 0418 03/03/20 0747 03/03/20 1151  GLUCAP 132* 220* 162* 208* 123*    No results found for this or any previous visit (from the past 240 hour(s)).   Studies: DG Chest 2 View  Result Date: 03/03/2020 CLINICAL DATA:  Pleural effusion. EXAM: CHEST - 2 VIEW COMPARISON:  February 19, 2020. FINDINGS: The heart size and mediastinal contours are within normal limits. Tracheostomy tube is in good position. No pneumothorax is noted. Right lung is clear. Left basilar atelectasis or infiltrate is noted with probable small left pleural effusion. The visualized skeletal structures are unremarkable. IMPRESSION: Left basilar atelectasis or infiltrate is noted with probable small left pleural effusion. Electronically Signed   By: Marijo Conception M.D.   On: 03/03/2020 10:34    Scheduled Meds: . aspirin  81 mg Per Tube Daily  . atovaquone  1,500 mg Per Tube Q breakfast  . carvedilol  3.125 mg Per Tube BID WC  . chlorhexidine  15 mL Mouth Rinse BID  . Chlorhexidine Gluconate Cloth  6 each Topical Daily  . docusate  100 mg Per Tube BID  . dolutegravir  50 mg Oral Daily  .  emtricitabine-tenofovir AF  1 tablet Per Tube Daily  . enoxaparin (LOVENOX) injection  40 mg Subcutaneous Q24H  . feeding supplement  237 mL Oral BID BM  . feeding supplement (OSMOLITE 1.5 CAL)  780 mL Per Tube Q24H  . influenza vaccine adjuvanted  0.5 mL Intramuscular Tomorrow-1000  . insulin aspart  0-15 Units Subcutaneous Q4H  . insulin detemir  15 Units Subcutaneous BID  . mouth rinse  15 mL Mouth Rinse q12n4p  . multivitamin  15 mL Per Tube Daily  . pantoprazole sodium  40 mg Per Tube QHS  . rosuvastatin  10 mg Per NG tube Daily   Continuous Infusions: . sodium chloride 250 mL (02/11/20 2212)  . sodium chloride Stopped (02/09/20 0556)    Active Problems:   DNR (do not resuscitate) discussion   Acute right MCA stroke ([place])   Middle cerebral artery embolism, right   Malnutrition of moderate degree   Adenocarcinoma of left lung ([place])   Adenocarcinoma ([place])   Palliative care by specialist   Infiltrate noted  on imaging study   Pressure injury of skin   Endotracheal tube present   Pleural effusion on left   Acute respiratory failure with hypoxia ([place])   Status post tracheostomy ([place])   Ventilator dependent [facility])   Consultants:  Neurology  Interventional radiology  PCCM  Oncology  Palliative care  Procedures:  Echocardiogram  EEG  Core track placement  Tracheostomy tube  PEG tube  Antibiotics: Anti-infectives (From admission, onward)   Start     Dose/Rate Route Frequency Ordered Stop   02/12/20 1000  vancomycin (VANCOREADY) IVPB 1250 mg/250 mL        1,250 mg 166.7 mL/hr over 90 Minutes Intravenous Every 24 hours 02/11/20 1509 02/18/20 1438   02/07/20 1310  vancomycin (VANCOREADY) IVPB 750 mg/150 mL  Status:  Discontinued        750 mg 150 mL/hr over 60 Minutes Intravenous Every 12 hours 02/07/20 1310 02/11/20 1509   02/06/20 0000  vancomycin (VANCOREADY) IVPB 500 mg/100 mL  Status:  Discontinued        500 mg 100 mL/hr over 60 Minutes Intravenous  Every 12 hours 02/05/20 1141 02/07/20 1310   02/05/20 1300  ceFEPIme (MAXIPIME) 2 g in sodium chloride 0.9 % 100 mL IVPB        2 g 200 mL/hr over 30 Minutes Intravenous Every 12 hours 02/05/20 1127 02/11/20 2140   02/05/20 1300  atovaquone (MEPRON) 750 MG/5ML suspension 1,500 mg        1,500 mg Per Tube Daily with breakfast 02/05/20 1137     02/05/20 1230  vancomycin (VANCOCIN) IVPB 1000 mg/200 mL premix        1,000 mg 200 mL/hr over 60 Minutes Intravenous  Once 02/05/20 1127 02/05/20 1304   01/30/20 1000  dolutegravir (TIVICAY) tablet 50 mg        50 mg Oral Daily 01/30/20 0941     01/30/20 1000  emtricitabine-tenofovir AF (DESCOVY) 200-25 MG per tablet 1 tablet        1 tablet Per Tube Daily 01/30/20 0941     01/29/20 1600  emtricitabine-tenofovir AF (DESCOVY) 200-25 MG per tablet 1 tablet  Status:  Discontinued        1 tablet Per Tube Daily 01/29/20 1437 01/29/20 1507   01/29/20 1600  dolutegravir (TIVICAY) tablet 50 mg  Status:  Discontinued        50 mg Oral Daily 01/29/20 1437 01/29/20 1507   01/29/20 1415  bictegravir-emtricitabine-tenofovir AF (BIKTARVY) 50-200-25 MG per tablet 1 tablet  Status:  Discontinued        1 tablet Oral Daily 01/29/20 1408 01/29/20 1437   01/29/20 1045  amoxicillin-clavulanate (AUGMENTIN) 875-125 MG per tablet 1 tablet  Status:  Discontinued        1 tablet Oral 2 times daily 01/29/20 1041 01/29/20 1510   01/29/20 1045  bictegravir-emtricitabine-tenofovir AF (BIKTARVY) 50-200-25 MG per tablet 1 tablet  Status:  Discontinued        1 tablet Oral Daily 01/29/20 1041 01/29/20 1042   01/29/20 0913  ceFAZolin (ANCEF) 2-4 GM/100ML-% IVPB       Note to Pharmacy: Tressie Stalker   : cabinet override      01/29/20 0913 01/29/20 2114        Time spent: 30 minutes    Erin Hearing ANP  [facility] Pager [phone]. If 7PM-7AM, please contact night-coverage at [email or web address] 03/03/2020, 12:51 PM  LOS: 34 days

## 2020-03-03 NOTE — Progress Notes (Signed)
  Speech Language Pathology Treatment: Dysphagia;[place] Speaking valve  Patient Details Name: SIMRAN BOMKAMP MRN: [id number] DOB: 12/30/1940 Today's Date: 03/03/2020 Time: 1355-1420 SLP Time Calculation (min) (ACUTE ONLY): 25 min  Assessment / Plan / Recommendation Clinical Impression   Patient seen by SLP to address speech (PMV) goals and swallow function goals. Patient tolerated repositioning in bed to move slightly more to center as she was leaning heavily to right. She achieved strong and clear voicing once PMV placed with SpO2 at 98% and RR in 20-24 range. Patient oriented to self, stated place as "[place]". She was confused, asking SLP to "get a chair for him" and looking at empty chair in front of her. During PO intake (lunch meal, Dys 1 thin liquids), patient consumed 3 bites of pureed solids and 2 very small sips of thin liquids. She then refused further PO's. She exhibited prolonged oral transit but boluses did clear her oral cavity noted during post PO oral care. Patient did have SpO2 drop to 88% at one point and did exhibit weak cough. This subsided and returned to baseline numbers. Concerned regarding patient's ability to take in much PO. Currently she has alternative feeding via PEG as well.    HPI HPI: Patient is a 79 y.o. female with PMH: HIV, depression, DM, HTN, HLD, arthritis, breast cancer, who was admitted on 10/7, who presented with acute onset of left hemiplegia, left facial droop, right eye deviation and aphasia. MR Brain revealed multifocal infarct in the right MCA territory affecting cortex and striatum, mild petechial hemorrhage as well as small acute left cerebellar infarct and generalized brain atrophy. She was intubated from 10/7 through 10/20 when trach was placed. PEG placed on 10/29.  Most recent CXR on 10/28 revealed persistent left base collapse/consolidation with small left pleural effusion. Patient has a Therapist, sports, changed to cuffless on 11/8, is tolerating  trach collar at 8 Liters, 35 FiO2.      SLP Plan  Continue with current plan of care       Recommendations  Diet recommendations: Dysphagia 1 (puree);Thin liquid Liquids provided via: Cup;Straw Medication Administration: Via alternative means Supervision: Full supervision/cueing for compensatory strategies;Staff to assist with self feeding Compensations: Minimize environmental distractions;Monitor for anterior loss Postural Changes and/or Swallow Maneuvers: Seated upright 90 degrees                Oral Care Recommendations: Oral care BID Follow up Recommendations: Skilled Nursing facility SLP Visit Diagnosis: Dysphagia, unspecified (R13.10) Plan: Continue with current plan of care       GO               Sonia Baller, MA, [facility]

## 2020-03-03 NOTE — Plan of Care (Signed)
°  Problem: Clinical Measurements: °Goal: Respiratory complications will improve °Outcome: Progressing °  °Problem: Pain Managment: °Goal: General experience of comfort will improve °Outcome: Progressing °  °Problem: Safety: °Goal: Ability to remain free from injury will improve °Outcome: Progressing °  °

## 2020-03-03 NOTE — Progress Notes (Signed)
Physical Therapy Treatment Patient Details Name: Amber Lopez MRN: [id number] DOB: 1940-06-22 Today's Date: 03/03/2020    History of Present Illness 79 y.o. female with a PMHx of depression, HLD, HIV, HTN and DM presenting with acute onset of left hemiplegia, left facial droop, right eye deviation and aphasia. Pt received IV tPA and underwent R MCA revacularization on 01/29/2020. Pt also found to have large left pleural effusion with near complete L lung collapse and L apical mass. Chest tube placed on 01/31/2020. s/p trach on 02/11/20.     PT Comments    Pt was lethargic and minimally responsive this date. She maintained a R gaze and would not initiate movements upon being provided verbal cues. Some activation/initiation was noted when provided physical assistance to transition supine > sit and with an attempt to come to stand, with minimal buttocks clearance and maxA this date. She would not perform LAQ or seated LE exercises when cued. Secondary to her lack of participation/overall decline and her minimal progress towards her goals her PT frequency has been reduced. Will continue to follow acutely and recommend SNF upon d/c.   Follow Up Recommendations  SNF;Supervision/Assistance - 24 hour     Equipment Recommendations  Wheelchair (measurements PT);Wheelchair cushion (measurements PT);Hospital bed;3in1 (PT);Other (comment) (mechanical lift)    Recommendations for Other Services       Precautions / Restrictions Precautions Precautions: Fall Precaution Comments: trach and flexiseal Restrictions Weight Bearing Restrictions: No    Mobility  Bed Mobility Overal bed mobility: Needs Assistance Bed Mobility: Supine to Sit;Sit to Supine     Supine to sit: Max assist;HOB elevated Sit to supine: Total assist;HOB elevated   General bed mobility comments: Cued pt to manage LEs off EOB and ascend trunk, with no initiation noted when provided VCs but pt did assist minimally once provided  physical assistance and TCs. No pt participation with return to supine.  Transfers Overall transfer level: Needs assistance Equipment used: 2 person hand held assist Transfers: Sit to/from Stand Sit to Stand: Max assist         General transfer comment: Attempted STS with B knee block to increase pt alertness, without success. Minimal clearance of buttocks momentarily from bed with maxA and minimal assist noted towards the end by the pt.  Ambulation/Gait             General Gait Details: unable   Stairs             Wheelchair Mobility    Modified Rankin (Stroke Patients Only) Modified Rankin (Stroke Patients Only) Pre-Morbid Rankin Score: Slight disability Modified Rankin: Severe disability     Balance Overall balance assessment: Needs assistance Sitting-balance support: Bilateral upper extremity supported;Feet unsupported Sitting balance-Leahy Scale: Poor Sitting balance - Comments: Min-modA to maintain static sitting balance EOB. Postural control: Left lateral lean Standing balance support: Bilateral upper extremity supported Standing balance-Leahy Scale: Zero Standing balance comment: MaxA and B knee block with min activation from pt noted towards end of brief partial stand attempt.                            Cognition Arousal/Alertness: Lethargic Behavior During Therapy: Flat affect Overall Cognitive Status: Impaired/Different from baseline                                 General Comments: Pt lethargic and mumbling only a few words  at end of session. Pt maintained R gaze majority of session. Provided cues and extra time to respond to VCs, but no initiation noted, but pt would respond minimally to TCs/assistance on occasion.      Exercises Other Exercises Other Exercises: Passive gentle stretch applied to neck into R lateral flexion, 1x ~40 seconds    General Comments General comments (skin integrity, edema, etc.): 5L/min  28% FiO2, >/=98% SpO2 throughout      Pertinent Vitals/Pain Pain Assessment: Faces Faces Pain Scale: Hurts a little bit Pain Location: generalized with movement Pain Descriptors / Indicators: Grimacing;Discomfort Pain Intervention(s): Limited activity within patient's tolerance;Monitored during session;Repositioned    Home Living                      Prior Function            PT Goals (current goals can now be found in the care plan section) Acute Rehab PT Goals Patient Stated Goal: did not state PT Goal Formulation: With patient Time For Goal Achievement: 03/11/20 Potential to Achieve Goals: Poor Progress towards PT goals: Not progressing toward goals - comment (frequency decreased )    Frequency    Min 1X/week      PT Plan Frequency needs to be updated    Co-evaluation              AM-PAC PT "6 Clicks" Mobility   Outcome Measure  Help needed turning from your back to your side while in a flat bed without using bedrails?: A Lot Help needed moving from lying on your back to sitting on the side of a flat bed without using bedrails?: A Lot Help needed moving to and from a bed to a chair (including a wheelchair)?: Total Help needed standing up from a chair using your arms (e.g., wheelchair or bedside chair)?: A Lot Help needed to walk in hospital room?: Total Help needed climbing 3-5 steps with a railing? : Total 6 Click Score: 9    End of Session Equipment Utilized During Treatment: Oxygen Activity Tolerance: Patient limited by fatigue;Patient limited by lethargy Patient left: in bed;with call bell/phone within reach;with bed alarm set   PT Visit Diagnosis: Other abnormalities of gait and mobility (R26.89);Muscle weakness (generalized) (M62.81);Hemiplegia and hemiparesis;Other symptoms and signs involving the nervous system (R29.898);Unsteadiness on feet (R26.81) Hemiplegia - Right/Left: Left Hemiplegia - dominant/non-dominant:  Non-dominant Hemiplegia - caused by: Cerebral infarction     Time: 5465-0354 PT Time Calculation (min) (ACUTE ONLY): 17 min  Charges:  $Therapeutic Activity: 8-22 mins                     Moishe Spice, PT, DPT Acute Rehabilitation Services  Pager: [phone] Office: [phone]    Orvan Falconer 03/03/2020, 2:47 PM

## 2020-03-04 DIAGNOSIS — R1312 Dysphagia, oropharyngeal phase: Secondary | ICD-10-CM

## 2020-03-04 DIAGNOSIS — I63511 Cerebral infarction due to unspecified occlusion or stenosis of right middle cerebral artery: Secondary | ICD-10-CM | POA: Diagnosis not present

## 2020-03-04 DIAGNOSIS — E440 Moderate protein-calorie malnutrition: Secondary | ICD-10-CM | POA: Diagnosis not present

## 2020-03-04 DIAGNOSIS — Z930 Tracheostomy status: Secondary | ICD-10-CM | POA: Diagnosis not present

## 2020-03-04 LAB — GLUCOSE, CAPILLARY
Glucose-Capillary: 166 mg/dL — ABNORMAL HIGH (ref 70–99)
Glucose-Capillary: 193 mg/dL — ABNORMAL HIGH (ref 70–99)
Glucose-Capillary: 285 mg/dL — ABNORMAL HIGH (ref 70–99)
Glucose-Capillary: 327 mg/dL — ABNORMAL HIGH (ref 70–99)
Glucose-Capillary: 57 mg/dL — ABNORMAL LOW (ref 70–99)
Glucose-Capillary: 74 mg/dL (ref 70–99)
Glucose-Capillary: 92 mg/dL (ref 70–99)

## 2020-03-04 MED ORDER — DOLUTEGRAVIR SODIUM 50 MG PO TABS
50.0000 mg | ORAL_TABLET | Freq: Every day | ORAL | Status: DC
  Administered 2020-03-05 – 2020-03-12 (×8): 50 mg via ORAL
  Filled 2020-03-04 (×9): qty 1

## 2020-03-04 MED ORDER — EMTRICITABINE-TENOFOVIR AF 200-25 MG PO TABS
1.0000 | ORAL_TABLET | Freq: Every day | ORAL | Status: DC
  Administered 2020-03-05 – 2020-03-14 (×10): 1 via ORAL
  Filled 2020-03-04 (×11): qty 1

## 2020-03-04 MED ORDER — CHLORHEXIDINE GLUCONATE 0.12 % MT SOLN
OROMUCOSAL | Status: AC
  Administered 2020-03-04: 15 mL via OROMUCOSAL
  Filled 2020-03-04: qty 15

## 2020-03-04 MED ORDER — ATOVAQUONE 750 MG/5ML PO SUSP
1500.0000 mg | Freq: Every day | ORAL | Status: DC
  Administered 2020-03-04 – 2020-03-22 (×19): 1500 mg
  Filled 2020-03-04 (×23): qty 10

## 2020-03-04 NOTE — Progress Notes (Signed)
Ok to resume atovaquone for PJP prophylaxis per Erin Hearing.  Onnie Boer, PharmD, BCIDP, AAHIVP, CPP Infectious Disease Pharmacist 03/04/2020 8:35 AM

## 2020-03-04 NOTE — Progress Notes (Signed)
[facility] PROGRESS NOTE  Amber Lopez RSW:[id number] DOB: Jan 06, 1941 DOA: 01/29/2020 PCP: Billie Ruddy, MD  Status: Remains inpatient appropriate because:Altered mental status, Unsafe d/c plan and Trach placed this admission on 10/20 and needs to be in place 30 days before can be considered for SNF placement   Dispo: The patient is from: Home              Anticipated d/c is to: SNF              Anticipated d/c date is: > 3 days              Patient currently is medically stable to d/c.   Code Status: DNR Family Communication: No family at bedside today DVT prophylaxis: Lovenox Vaccination status: Patient completed both doses of Pfizer Covid vaccine on 09/01/2019  Foley catheter: No  HPI: 79 year old female with past medical history for HIV/AIDS, hypertension, dyslipidemia, diabetes, depression, arthritis and breast cancer.  Patient presented to the ER on 10/7 with strokelike symptoms consisting of left hemiplegia, facial droop, aphasia and right ideation.  CTA head neck showed emergent LVO at the right M1 segment with patient subsequently undergoing TPA and revascularization by neurology and IR.  She was admitted to the ICU postprocedure on mechanical ventilation.  Unfortunately patient's mentation and neurological status remained poor.  She was not able to follow commands and she was unable to wean from the ventilator.  Tracheostomy tube was placed on 10/20 and a PEG tube was placed on 10/29.  Unfortunately her neurological status has not improved.  Other complications since admission include a large left pleural effusion/left apical mass with pathology assistant with metastatic adenocarcinoma.  She has been denied for LTAC placement.  Palliative care has been discussing with patient family her poor prognosis and initially family requested patient be transferred to another facility but no clinical reason to transfer therefore patient remains.  Subjective: Her and interactive  today.  Verbally responsive.  No complaints of pain verbalized when specifically asked.  Primarily responds to all questions with "yes ma'am"  Objective: Vitals:   03/04/20 0842 03/04/20 1158  BP:    Pulse: 88 85  Resp: 18 (!) 23  Temp:    SpO2: 100%     Intake/Output Summary (Last 24 hours) at 03/04/2020 1208 Last data filed at 03/04/2020 0900 Gross per 24 hour  Intake 2471.75 ml  Output 1850 ml  Net 621.75 ml   Filed Weights   03/01/20 0500 03/02/20 0500 03/03/20 0331  Weight: 57 kg 58.2 kg 58.6 kg    Exam:  Constitutional: NAD, calm, comfortable Respiratory: clear to auscultation bilaterally without, no wheezing, no crackles. Normal respiratory effort. No accessory muscle use. Changed to #6 cuffless trach on 11/8 Cardiovascular: Regular rate and rhythm, no murmurs / rubs / gallops. No extremity edema. 2+ pedal pulses. .  Abdomen: no obvious tenderness, no masses palpated. Bowel sounds positive.  G-tube in place with tube feeding infusing.  Flexi-Seal in place draining small to moderate amount of liquid brown stool to bedside bag Musculoskeletal: No joint deformity upper and lower extremities. Good ROM, no contractures.  No spasticity Skin: no rashes, lesions, ulcers. No induration Neurologic: CN 2-12 grossly intact.  Sensation intact.  Patient moving all extremities x4 but very weak. Psychiatric: Alert and oriented to name.  Difficult to ascertain other levels of orientation given inability to consistently phonate with tracheostomy tube in place.   Assessment/Plan:  Acute embolic CVA in the right MCA and  left cerebellum -Status post TPA and revascularization. -Patient has left-sided hemineglect and left hemiparesis and remains verbally nonresponsive and not following commands which apparently was not baseline mentation before stroke -PT/OT documents patient has been able to participate somewhat during sessions-as a preference for leading towards left side and appearing  uncomfortable.  She requires total assist for all aspects of bed mobility and currently is nonambulatory. -Continues to require tracheostomy tube for airway management and PEG tube for feedings -Continueprophylaxis with Diona Fanti, crestor -Patient continues with variable levels of alertness but as of 11/11 more alert and moving all extremities x4 and responding appropriately to questions asked. -Showed assistance of OT and PT and SLP  Acute ventilatory dependent respiratory failure with hypoxemia/Inability to clear secretions/requires tracheostomy tube -Respiratory status stable on 21% FiO2 -11/8 transitioned to cuffless trach -Continue aggressive pulmonary hygiene -Speech therapy evaluation. MBS 11/2 done. Dysphagia one diet recommended but patient only tolerating small bites of food.  Also has PEG tube.  To need to encourage oral intake -Use PMV with all oral intake. -Initiate assistance of OT and SLP  Adenocarcinoma of the left lung with left pleural effusion  -CT of chest on admission showed persistent large left pleural effusion with near complete left lung collapse. Left apical mass measuring 3.5 x 3.3 cm suspicious for bronchogenic carcinoma -Oral fluid pathology consistent with metastatic adenocarcinoma -poor performance status precludes chemotherapy with metastatic disease  -Patient will follow with oncology as an outpatient if improves clinically  Dysphagia/ Nutrition Status: Nutrition Problem: Moderate Malnutrition Etiology: chronic illness (HIV) Signs/Symptoms: mild muscle depletion, moderate muscle depletion, mild fat depletion, moderate fat depletion Interventions: Ensure Enlive (each supplement provides 350kcal and 20 grams of protein), Tube feeding, MVI  Acute diarrhea -Ongoing since 11/6 prompting discontinuation of laxative, fiber supplement (upon review of MAR 11/10 it appears Colace scheduled remained on medications therefore it was discontinued) -As of 11/10 since  diarrhea persists and no obvious infectious etiology will begin scheduled Imodium with consideration to add Bentyl if necessary on 11/12 if no significant improvement  Hypertension/left ventricular diastolic dysfunction -Heart rate and blood pressure stable on Coreg. -No clinical signs consistent with heart failure  HIV/AIDS -Continue home medications including: atovaquone, dolutegravir and descovy; discussing with pharmacy and ID about transitioning to more affordable option since we are pursuing SNF care and expensive medications may be barrier to placement  Type 2 diabetes mellitus -A1c 6.5 on 10/7. -Currently on 15 units Levemir twice daily with sliding scale insulin.  Chronic normocytic anemia -Hemoglobin stable at 9.0  GOC  -Family had requested transfer to another facility when informed poor prognosis.  At this time no clinical reason to transfer to other facility.  Palliative care consult appreciated. -LTAC denied -Case management working to find up SNF  Pressure Injury 02/16/20 Perineum Right Stage 2 -  Partial thickness loss of dermis presenting as a shallow open injury with a red, pink wound bed without slough. 2cm x 1cm pink (Active)  Date First Assessed/Time First Assessed: 02/16/20 1200   Location: Perineum  Location Orientation: Right  Staging: Stage 2 -  Partial thickness loss of dermis presenting as a shallow open injury with a red, pink wound bed without slough.  Wound Descri...    Assessments 02/16/2020 12:00 PM 03/04/2020 11:00 AM  Dressing Type Moisture barrier --  Dressing -- Changed  Site / Wound Assessment Clean;Pink Clean;Dry  Peri-wound Assessment Intact --  Wound Length (cm) 2 cm --  Wound Width (cm) 1 cm --  Wound Depth (  cm) 0 cm --  Wound Surface Area (cm^2) 2 cm^2 --  Wound Volume (cm^3) 0 cm^3 --  Margins Unattached edges (unapproximated) --  Drainage Amount Scant --  Treatment Cleansed --     No Linked orders to display      Data  Reviewed: Basic Metabolic Panel: Recent Labs  Lab 02/29/20 0412 03/02/20 0313  NA 135 135  K 5.5* 4.6  CL 98 99  CO2 26 27  GLUCOSE 135* 126*  BUN 21 21  CREATININE 1.02* 1.08*  CALCIUM 9.1 8.9  MG 2.1  --   PHOS 3.9  --    Liver Function Tests: No results for input(s): AST, ALT, ALKPHOS, BILITOT, PROT, ALBUMIN in the last 168 hours. No results for input(s): LIPASE, AMYLASE in the last 168 hours. No results for input(s): AMMONIA in the last 168 hours. CBC: Recent Labs  Lab 02/29/20 0412 03/02/20 0313  WBC 10.8* 10.8*  NEUTROABS 8.4* 8.2*  HGB 9.4* 9.0*  HCT 30.3* 29.2*  MCV 94.4 93.6  PLT 568* 510*   Cardiac Enzymes: No results for input(s): CKTOTAL, CKMB, CKMBINDEX, TROPONINI in the last 168 hours. BNP (last 3 results) No results for input(s): BNP in the last 8760 hours.  ProBNP (last 3 results) Recent Labs    08/07/19 1422  PROBNP 110.0*    CBG: Recent Labs  Lab 03/03/20 2056 03/04/20 0018 03/04/20 0337 03/04/20 0755 03/04/20 1156  GLUCAP 87 327* 92 193* 285*    No results found for this or any previous visit (from the past 240 hour(s)).   Studies: DG Chest 2 View  Result Date: 03/03/2020 CLINICAL DATA:  Pleural effusion. EXAM: CHEST - 2 VIEW COMPARISON:  February 19, 2020. FINDINGS: The heart size and mediastinal contours are within normal limits. Tracheostomy tube is in good position. No pneumothorax is noted. Right lung is clear. Left basilar atelectasis or infiltrate is noted with probable small left pleural effusion. The visualized skeletal structures are unremarkable. IMPRESSION: Left basilar atelectasis or infiltrate is noted with probable small left pleural effusion. Electronically Signed   By: Marijo Conception M.D.   On: 03/03/2020 10:34    Scheduled Meds: . aspirin  81 mg Per Tube Daily  . atovaquone  1,500 mg Per Tube Q breakfast  . carvedilol  3.125 mg Per Tube BID WC  . chlorhexidine  15 mL Mouth Rinse BID  . Chlorhexidine Gluconate  Cloth  6 each Topical Daily  . [START ON 03/05/2020] dolutegravir  50 mg Oral Daily  . [START ON 03/05/2020] emtricitabine-tenofovir AF  1 tablet Oral Daily  . enoxaparin (LOVENOX) injection  40 mg Subcutaneous Q24H  . feeding supplement  237 mL Oral BID BM  . feeding supplement (OSMOLITE 1.5 CAL)  780 mL Per Tube Q24H  . influenza vaccine adjuvanted  0.5 mL Intramuscular Tomorrow-1000  . insulin aspart  0-15 Units Subcutaneous Q4H  . insulin detemir  15 Units Subcutaneous BID  . loperamide HCl  2 mg Per Tube Q12H  . mouth rinse  15 mL Mouth Rinse q12n4p  . multivitamin  15 mL Per Tube Daily  . pantoprazole sodium  40 mg Per Tube QHS  . rosuvastatin  10 mg Per NG tube Daily   Continuous Infusions: . sodium chloride 250 mL (02/11/20 2212)  . sodium chloride Stopped (02/09/20 0556)    Active Problems:   DNR (do not resuscitate) discussion   Acute right MCA stroke ([place])   Middle cerebral artery embolism, right   Malnutrition  of moderate degree   Adenocarcinoma of left lung ([place])   Adenocarcinoma ([place])   Palliative care by specialist   Infiltrate noted on imaging study   Pressure injury of skin   Endotracheal tube present   Pleural effusion on left   Acute respiratory failure with hypoxia ([place])   Status post tracheostomy ([facility])   Ventilator dependent [facility])   Consultants:  Neurology  Interventional radiology  PCCM  Oncology  Palliative care  Procedures:  Echocardiogram  EEG  Core track placement  Tracheostomy tube  PEG tube  Antibiotics: Anti-infectives (From admission, onward)   Start     Dose/Rate Route Frequency Ordered Stop   03/05/20 1000  dolutegravir (TIVICAY) tablet 50 mg        50 mg Oral Daily 03/04/20 1008     03/05/20 1000  emtricitabine-tenofovir AF (DESCOVY) 200-25 MG per tablet 1 tablet        1 tablet Oral Daily 03/04/20 1008     03/04/20 0930  atovaquone (MEPRON) 750 MG/5ML suspension 1,500 mg        1,500 mg Per Tube Daily with  breakfast 03/04/20 0833     03/04/20 0800  Darunavir-Cobicisctat-Emtricitabine-Tenofovir Alafenamide (SYMTUZA) 800-150-200-10 MG TABS 1 tablet  Status:  Discontinued        1 tablet Oral Daily with breakfast 03/03/20 1345 03/04/20 1008   02/12/20 1000  vancomycin (VANCOREADY) IVPB 1250 mg/250 mL        1,250 mg 166.7 mL/hr over 90 Minutes Intravenous Every 24 hours 02/11/20 1509 02/18/20 1438   02/07/20 1310  vancomycin (VANCOREADY) IVPB 750 mg/150 mL  Status:  Discontinued        750 mg 150 mL/hr over 60 Minutes Intravenous Every 12 hours 02/07/20 1310 02/11/20 1509   02/06/20 0000  vancomycin (VANCOREADY) IVPB 500 mg/100 mL  Status:  Discontinued        500 mg 100 mL/hr over 60 Minutes Intravenous Every 12 hours 02/05/20 1141 02/07/20 1310   02/05/20 1300  ceFEPIme (MAXIPIME) 2 g in sodium chloride 0.9 % 100 mL IVPB        2 g 200 mL/hr over 30 Minutes Intravenous Every 12 hours 02/05/20 1127 02/11/20 2140   02/05/20 1300  atovaquone (MEPRON) 750 MG/5ML suspension 1,500 mg  Status:  Discontinued        1,500 mg Per Tube Daily with breakfast 02/05/20 1137 03/03/20 1345   02/05/20 1230  vancomycin (VANCOCIN) IVPB 1000 mg/200 mL premix        1,000 mg 200 mL/hr over 60 Minutes Intravenous  Once 02/05/20 1127 02/05/20 1304   01/30/20 1000  dolutegravir (TIVICAY) tablet 50 mg  Status:  Discontinued        50 mg Oral Daily 01/30/20 0941 03/03/20 1345   01/30/20 1000  emtricitabine-tenofovir AF (DESCOVY) 200-25 MG per tablet 1 tablet  Status:  Discontinued        1 tablet Per Tube Daily 01/30/20 0941 03/03/20 1345   01/29/20 1600  emtricitabine-tenofovir AF (DESCOVY) 200-25 MG per tablet 1 tablet  Status:  Discontinued        1 tablet Per Tube Daily 01/29/20 1437 01/29/20 1507   01/29/20 1600  dolutegravir (TIVICAY) tablet 50 mg  Status:  Discontinued        50 mg Oral Daily 01/29/20 1437 01/29/20 1507   01/29/20 1415  bictegravir-emtricitabine-tenofovir AF (BIKTARVY) 50-200-25 MG per tablet 1  tablet  Status:  Discontinued        1 tablet Oral Daily  01/29/20 1408 01/29/20 1437   01/29/20 1045  amoxicillin-clavulanate (AUGMENTIN) 875-125 MG per tablet 1 tablet  Status:  Discontinued        1 tablet Oral 2 times daily 01/29/20 1041 01/29/20 1510   01/29/20 1045  bictegravir-emtricitabine-tenofovir AF (BIKTARVY) 50-200-25 MG per tablet 1 tablet  Status:  Discontinued        1 tablet Oral Daily 01/29/20 1041 01/29/20 1042   01/29/20 0913  ceFAZolin (ANCEF) 2-4 GM/100ML-% IVPB       Note to Pharmacy: Tressie Stalker   : cabinet override      01/29/20 0913 01/29/20 2114       Time spent: 30 minutes    Erin Hearing ANP  [facility] Pager [phone]. If 7PM-7AM, please contact night-coverage at [email or web address] 03/04/2020, 12:08 PM  LOS: 35 days

## 2020-03-04 NOTE — TOC Benefit Eligibility Note (Signed)
Transition of Care [facility]) Benefit Eligibility Note    Patient Details  Name: Amber Lopez MRN: [id number] Date of Birth: 1940/12/24   Medication/Dose: SYMTUZA    800-150-200-10 MG TABS DAILY  Covered?: Yes  Tier:  (TIER- 5 DRUG)  Prescription Coverage Preferred Pharmacy: [facility]  and   [facility]  Spoke with Person/Company/Phone Number:: [name]  @ [place] [employer] # [phone]  Co-Pay: $ 80.63  Prior Approval: No  Deductible: Met (OUT-OF-POCKET: [name] Phone Number: 03/04/2020, 12:16 PM

## 2020-03-05 DIAGNOSIS — I63511 Cerebral infarction due to unspecified occlusion or stenosis of right middle cerebral artery: Secondary | ICD-10-CM | POA: Diagnosis not present

## 2020-03-05 DIAGNOSIS — C801 Malignant (primary) neoplasm, unspecified: Secondary | ICD-10-CM | POA: Diagnosis not present

## 2020-03-05 DIAGNOSIS — Z930 Tracheostomy status: Secondary | ICD-10-CM | POA: Diagnosis not present

## 2020-03-05 LAB — GLUCOSE, CAPILLARY
Glucose-Capillary: 117 mg/dL — ABNORMAL HIGH (ref 70–99)
Glucose-Capillary: 167 mg/dL — ABNORMAL HIGH (ref 70–99)
Glucose-Capillary: 208 mg/dL — ABNORMAL HIGH (ref 70–99)
Glucose-Capillary: 211 mg/dL — ABNORMAL HIGH (ref 70–99)
Glucose-Capillary: 237 mg/dL — ABNORMAL HIGH (ref 70–99)
Glucose-Capillary: 266 mg/dL — ABNORMAL HIGH (ref 70–99)

## 2020-03-05 MED ORDER — REVEFENACIN 175 MCG/3ML IN SOLN
175.0000 ug | Freq: Every day | RESPIRATORY_TRACT | Status: DC
  Administered 2020-03-05 – 2020-03-11 (×6): 175 ug via RESPIRATORY_TRACT
  Filled 2020-03-05 (×7): qty 3

## 2020-03-05 NOTE — Progress Notes (Signed)
Occupational Therapy Treatment Patient Details Name: Amber Lopez MRN: [id number] DOB: 13-Aug-1940 Today's Date: 03/05/2020    History of present illness 79 y.o. female with a PMHx of depression, HLD, HIV, HTN and DM presenting with acute onset of left hemiplegia, left facial droop, right eye deviation and aphasia. Pt received IV tPA and underwent R MCA revacularization on 01/29/2020. Pt also found to have large left pleural effusion with near complete L lung collapse and L apical mass. Chest tube placed on 01/31/2020. s/p trach on 02/11/20.    OT comments  New increased size to soft pillow splint provided to patient due to skin pressure concerns. RN and tech to help educate night staff to splint needs for rest for 6 hours this PM. OT to assess skin again in the AM and ensure splint fit properly throughout resting period. All education provided to staff at this time and order placed to help with communication of pt needs. Recommendation remains SNF   Follow Up Recommendations  SNF    Equipment Recommendations  Hospital bed;Wheelchair cushion (measurements OT);Wheelchair (measurements OT);Other (comment)    Recommendations for Other Services      Precautions / Restrictions Precautions Precautions: Fall Precaution Comments: trach and flexiseal       Mobility Bed Mobility Overal bed mobility: Needs Assistance Bed Mobility: Rolling Rolling: Total assist         General bed mobility comments: unable to fully achieve roll with one person  Transfers                      Balance                                           ADL either performed or assessed with clinical judgement   ADL Overall ADL's : Needs assistance/impaired     Grooming: Wash/dry face;Total assistance Grooming Details (indicate cue type and reason): does not attempt to assist                               General ADL Comments: positioned in bed total (A) with bed  tilted     Vision       Perception     Praxis      Cognition Arousal/Alertness: Lethargic Behavior During Therapy: Flat affect Overall Cognitive Status: Impaired/Different from baseline                                 General Comments: arouses to name call but drifting back to sleep at times        Exercises Other Exercises Other Exercises: new soft splint applied RN and tech educated on night time wear schedule   Shoulder Instructions       General Comments trach collar R gaze preference    Pertinent Vitals/ Pain       Pain Assessment: No/denies pain Faces Pain Scale: Hurts a little bit Pain Location: generalized with movement Pain Descriptors / Indicators: Grimacing;Discomfort Pain Intervention(s): Monitored during session;Repositioned  Home Living                                          Prior  Functioning/Environment              Frequency  Min 2X/week        Progress Toward Goals  OT Goals(current goals can now be found in the care plan section)  Progress towards OT goals: Not progressing toward goals - comment  Acute Rehab OT Goals Patient Stated Goal: did not state OT Goal Formulation: With patient Time For Goal Achievement: 03/19/20 Potential to Achieve Goals: Fair ADL Goals Pt Will Perform Grooming: with min assist;sitting Pt Will Perform Upper Body Bathing: with mod assist;sitting Pt Will Transfer to Toilet: with max assist;with +2 assist;bedside commode Additional ADL Goal #1: Pt will locate ADL items at midline with min cues Additional ADL Goal #2: Family will be independent with PROM/positioning Lt UE  Plan Discharge plan remains appropriate    Co-evaluation                 AM-PAC OT "6 Clicks" Daily Activity     Outcome Measure   Help from another person eating meals?: Total Help from another person taking care of personal grooming?: Total Help from another person toileting, which  includes using toliet, bedpan, or urinal?: Total Help from another person bathing (including washing, rinsing, drying)?: Total Help from another person to put on and taking off regular upper body clothing?: Total Help from another person to put on and taking off regular lower body clothing?: Total 6 Click Score: 6    End of Session Equipment Utilized During Treatment: Oxygen  OT Visit Diagnosis: Hemiplegia and hemiparesis Hemiplegia - Right/Left: Left Hemiplegia - dominant/non-dominant: Non-Dominant Hemiplegia - caused by: Cerebral infarction   Activity Tolerance Patient tolerated treatment well   Patient Left in bed;with call bell/phone within reach;Other (comment)   Nurse Communication Mobility status;Precautions        Time: 8921-1941 OT Time Calculation (min): 10 min  Charges: OT General Charges $OT Visit: 1 Visit OT Treatments $Self Care/Home Management : 23-37 mins $Orthotics Fit/Training: 8-22 mins   Brynn, OTR/L  Acute Rehabilitation Services Pager: [phone] Office: [phone] .    Jeri Modena 03/05/2020, 2:42 PM

## 2020-03-05 NOTE — Progress Notes (Addendum)
[facility] PROGRESS NOTE  Amber Lopez OEV:[id number] DOB: [birth year] DOA: 01/29/2020 PCP: Billie Ruddy, MD  Status: Remains inpatient appropriate because:Altered mental status, Unsafe d/c plan and Trach placed this admission on 10/20 and needs to be in place 30 days before can be considered for SNF placement   Dispo: The patient is from: Home              Anticipated d/c is to: SNF              Anticipated d/c date is: > 3 days              Patient currently is medically stable to d/c.   Code Status: DNR Family Communication: No family at bedside today DVT prophylaxis: Lovenox Vaccination status: Patient completed both doses of Pfizer Covid vaccine on 09/01/2019  Foley catheter: No  HPI: 79 year old female with past medical history for HIV/AIDS, hypertension, dyslipidemia, diabetes, depression, arthritis and breast cancer.  Patient presented to the ER on 10/7 with strokelike symptoms consisting of left hemiplegia, facial droop, aphasia and right ideation.  CTA head neck showed emergent LVO at the right M1 segment with patient subsequently undergoing TPA and revascularization by neurology and IR.  She was admitted to the ICU postprocedure on mechanical ventilation.  Unfortunately patient's mentation and neurological status remained poor.  She was not able to follow commands and she was unable to wean from the ventilator.  Tracheostomy tube was placed on 10/20 and a PEG tube was placed on 10/29.  Unfortunately her neurological status has not improved.  Other complications since admission include a large left pleural effusion/left apical mass with pathology assistant with metastatic adenocarcinoma.  She has been denied for LTAC placement.  Palliative care has been discussing with patient family her poor prognosis and initially family requested patient be transferred to another facility but no clinical reason to transfer therefore patient remains facility.  Subjective: Awake,  attempting to verbalize, had difficulty answering orientation questions.  Moving all extremities x4.  No definitive complaints elucidated.  Objective: Vitals:   03/05/20 0732 03/05/20 0852  BP:  (!) 138/55  Pulse: 85 (!) 104  Resp:    Temp:    SpO2:      Intake/Output Summary (Last 24 hours) at 03/05/2020 1059 Last data filed at 03/05/2020 0026 Gross per 24 hour  Intake 100 ml  Output 800 ml  Net -700 ml   Filed Weights   03/02/20 0500 03/03/20 0331 03/05/20 0500  Weight: 58.2 kg 58.6 kg 56 kg    Exam:  Constitutional: NAD, calm, comfortable Respiratory: clear to auscultation bilaterally without, no wheezing, no crackles. Normal respiratory effort. No accessory muscle use. Changed to #6 cuffless trach on 11/8 Cardiovascular: Regular rate and rhythm, no murmurs / rubs / gallops. No extremity edema. 2+ pedal pulses. .  Abdomen: no obvious tenderness, no masses palpated. Bowel sounds positive.  G-tube in place with tube feeding infusing.  Flexi-Seal in place draining small to moderate amount of liquid brown stool to bedside bag Musculoskeletal: No joint deformity upper and lower extremities. Good ROM, upon closer evaluation today patient noted with flexion contracture involving the left upper extremity with pain reported and difficulty extending her arm past about a 90 degree level.  No spasticity Skin: no rashes, lesions, ulcers. No induration Neurologic: CN 2-12 grossly intact.  Sensation intact.  Patient moving all extremities x4 but very weak. Psychiatric: Alert and oriented to name.  Difficult to ascertain other levels of orientation  given inability to consistently phonate with tracheostomy tube in place.   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 -Recent issues with acute encephalopathy and lack of response as well as left hemiparesis-as of 11/12 she is more awake and following commands  and attempting to vocalize over trach -Noted with flexion contracture left upper extremity-we will continue PT and OT -Continue SLP both for swallowing and speech and cognition  Left upper extremity contracture -Continue PT and OT -OT recommended soft pillow splint 6 hours every HS  Acute ventilatory dependent respiratory failure with hypoxemia/Inability to clear secretions/requires tracheostomy tube -Respiratory status stable on 21% FiO2 -11/8 transitioned to cuffless trach -Speech therapy evaluation. MBS 11/2 done. Dysphagia 1 diet recommended but patient only tolerating small bites of food. -Use PMV with all oral intake. -Continue SLP as above -Trach team recommends trial of CPT and yulperi to aid with secretions-ready for capping trials at this juncture  Adenocarcinoma of the left lung with left pleural effusion  -CT of chest on admission showed persistent large left pleural effusion with near complete left lung collapse. Left apical mass measuring 3.5 x 3.3 cm suspicious for bronchogenic carcinoma -Pleural fluid pathology consistent with metastatic adenocarcinoma -poor performance status precludes chemotherapy with metastatic disease  -Patient will follow with oncology as an outpatient if improves clinically  Dysphagia/ Nutrition Status: Nutrition Problem: Moderate Malnutrition Etiology: chronic illness (HIV) Signs/Symptoms: mild muscle depletion, moderate muscle depletion, mild fat depletion, moderate fat depletion Interventions: Ensure Enlive (each supplement provides 350kcal and 20 grams of protein), Tube feeding, MVI  Estimated body mass index is 25.8 kg/m as calculated from the following:   Height as of this encounter: 4\' 10"  (1.473 m).   Weight as of this encounter: 56 kg.  Acute diarrhea -Ongoing since 11/6 prompting discontinuation of laxative, fiber supplement (upon review of MAR 11/10 it appears Colace scheduled remained on medications therefore it was  discontinued) -11/10 Imodium initiated with decrease in diarrhea volume-suspect may be related to tube feeding-continue to follow and consider Bentyl versus changing tube feeding  [place]  -Family had requested transfer to another facility when informed poor prognosis.  At this time no clinical reason to transfer to other facility.  Palliative care consult appreciated. -LTAC denied -Case management working to find SNF  Decubitus ulcer not POA Pressure Injury 02/16/20 Perineum Right Stage 2 -  Partial thickness loss of dermis presenting as a shallow open injury with a red, pink wound bed without slough. 2cm x 1cm pink (Active)  Date First Assessed/Time First Assessed: 02/16/20 1200   Location: Perineum  Location Orientation: Right  Staging: Stage 2 -  Partial thickness loss of dermis presenting as a shallow open injury with a red, pink wound bed without slough.  Wound Descri...    Assessments 02/16/2020 12:00 PM 03/04/2020  7:48 PM  Dressing Type Moisture barrier Foam - Lift dressing to assess site every shift  Site / Wound Assessment Clean;Pink --  Peri-wound Assessment Intact --  Wound Length (cm) 2 cm --  Wound Width (cm) 1 cm --  Wound Depth (cm) 0 cm --  Wound Surface Area (cm^2) 2 cm^2 --  Wound Volume (cm^3) 0 cm^3 --  Margins Unattached edges (unapproximated) --  Drainage Amount Scant --  Treatment Cleansed --     No Linked orders to display    Other problems: Hypertension/left ventricular diastolic dysfunction -Heart rate and blood pressure stable on Coreg. -No clinical signs consistent  with heart failure  HIV/AIDS -Continue home medications including: atovaquone, dolutegravir and descovy; discussing with pharmacy and ID about transitioning to more affordable option since we are pursuing SNF care and expensive medications may be barrier to placement  Type 2 diabetes mellitus -A1c 6.5 on 10/7. -Currently stable on 15 units Levemir twice daily with sliding scale  insulin.  Chronic normocytic anemia -Hemoglobin stable at 9.0    Data Reviewed: Basic Metabolic Panel: Recent Labs  Lab 02/29/20 0412 03/02/20 0313  NA 135 135  K 5.5* 4.6  CL 98 99  CO2 26 27  GLUCOSE 135* 126*  BUN 21 21  CREATININE 1.02* 1.08*  CALCIUM 9.1 8.9  MG 2.1  --   PHOS 3.9  --    Liver Function Tests: No results for input(s): AST, ALT, ALKPHOS, BILITOT, PROT, ALBUMIN in the last 168 hours. No results for input(s): LIPASE, AMYLASE in the last 168 hours. No results for input(s): AMMONIA in the last 168 hours. CBC: Recent Labs  Lab 02/29/20 0412 03/02/20 0313  WBC 10.8* 10.8*  NEUTROABS 8.4* 8.2*  HGB 9.4* 9.0*  HCT 30.3* 29.2*  MCV 94.4 93.6  PLT 568* 510*   Cardiac Enzymes: No results for input(s): CKTOTAL, CKMB, CKMBINDEX, TROPONINI in the last 168 hours. BNP (last 3 results) No results for input(s): BNP in the last 8760 hours.  ProBNP (last 3 results) Recent Labs    08/07/19 1422  PROBNP 110.0*    CBG: Recent Labs  Lab 03/04/20 1633 03/04/20 2006 03/05/20 0020 03/05/20 0401 03/05/20 0735  GLUCAP 74 166* 237* 167* 208*    No results found for this or any previous visit (from the past 240 hour(s)).   Studies: No results found.  Scheduled Meds: . aspirin  81 mg Per Tube Daily  . atovaquone  1,500 mg Per Tube Q breakfast  . carvedilol  3.125 mg Per Tube BID WC  . chlorhexidine  15 mL Mouth Rinse BID  . Chlorhexidine Gluconate Cloth  6 each Topical Daily  . dolutegravir  50 mg Oral Daily  . emtricitabine-tenofovir AF  1 tablet Oral Daily  . enoxaparin (LOVENOX) injection  40 mg Subcutaneous Q24H  . feeding supplement  237 mL Oral BID BM  . feeding supplement (OSMOLITE 1.5 CAL)  780 mL Per Tube Q24H  . influenza vaccine adjuvanted  0.5 mL Intramuscular Tomorrow-1000  . insulin aspart  0-15 Units Subcutaneous Q4H  . insulin detemir  15 Units Subcutaneous BID  . loperamide HCl  2 mg Per Tube Q12H  . mouth rinse  15 mL Mouth  Rinse q12n4p  . multivitamin  15 mL Per Tube Daily  . pantoprazole sodium  40 mg Per Tube QHS  . rosuvastatin  10 mg Per NG tube Daily   Continuous Infusions: . sodium chloride 250 mL (02/11/20 2212)  . sodium chloride Stopped (02/09/20 0556)    Active Problems:   DNR (do not resuscitate) discussion   Acute right MCA stroke ([place])   Middle cerebral artery embolism, right   Malnutrition of moderate degree   Adenocarcinoma of left lung ([place])   Adenocarcinoma ([place])   Palliative care by specialist   Infiltrate noted on imaging study   Pressure injury of skin   Endotracheal tube present   Pleural effusion on left   Acute respiratory failure with hypoxia ([place])   Status post tracheostomy ([place])   Ventilator dependent ([place])   Oropharyngeal dysphagia   Consultants:  Neurology  Interventional radiology  PCCM  Oncology  Palliative care  Procedures:  Echocardiogram  EEG  Core track placement  Tracheostomy tube  PEG tube  Antibiotics: Anti-infectives (From admission, onward)   Start     Dose/Rate Route Frequency Ordered Stop   03/05/20 1000  dolutegravir (TIVICAY) tablet 50 mg        50 mg Oral Daily 03/04/20 1008     03/05/20 1000  emtricitabine-tenofovir AF (DESCOVY) 200-25 MG per tablet 1 tablet        1 tablet Oral Daily 03/04/20 1008     03/04/20 0930  atovaquone (MEPRON) 750 MG/5ML suspension 1,500 mg        1,500 mg Per Tube Daily with breakfast 03/04/20 0833     03/04/20 0800  Darunavir-Cobicisctat-Emtricitabine-Tenofovir Alafenamide (SYMTUZA) 800-150-200-10 MG TABS 1 tablet  Status:  Discontinued        1 tablet Oral Daily with breakfast 03/03/20 1345 03/04/20 1008   02/12/20 1000  vancomycin (VANCOREADY) IVPB 1250 mg/250 mL        1,250 mg 166.7 mL/hr over 90 Minutes Intravenous Every 24 hours 02/11/20 1509 02/18/20 1438   02/07/20 1310  vancomycin (VANCOREADY) IVPB 750 mg/150 mL  Status:  Discontinued        750 mg 150 mL/hr over 60 Minutes Intravenous  Every 12 hours 02/07/20 1310 02/11/20 1509   02/06/20 0000  vancomycin (VANCOREADY) IVPB 500 mg/100 mL  Status:  Discontinued        500 mg 100 mL/hr over 60 Minutes Intravenous Every 12 hours 02/05/20 1141 02/07/20 1310   02/05/20 1300  ceFEPIme (MAXIPIME) 2 g in sodium chloride 0.9 % 100 mL IVPB        2 g 200 mL/hr over 30 Minutes Intravenous Every 12 hours 02/05/20 1127 02/11/20 2140   02/05/20 1300  atovaquone (MEPRON) 750 MG/5ML suspension 1,500 mg  Status:  Discontinued        1,500 mg Per Tube Daily with breakfast 02/05/20 1137 03/03/20 1345   02/05/20 1230  vancomycin (VANCOCIN) IVPB 1000 mg/200 mL premix        1,000 mg 200 mL/hr over 60 Minutes Intravenous  Once 02/05/20 1127 02/05/20 1304   01/30/20 1000  dolutegravir (TIVICAY) tablet 50 mg  Status:  Discontinued        50 mg Oral Daily 01/30/20 0941 03/03/20 1345   01/30/20 1000  emtricitabine-tenofovir AF (DESCOVY) 200-25 MG per tablet 1 tablet  Status:  Discontinued        1 tablet Per Tube Daily 01/30/20 0941 03/03/20 1345   01/29/20 1600  emtricitabine-tenofovir AF (DESCOVY) 200-25 MG per tablet 1 tablet  Status:  Discontinued        1 tablet Per Tube Daily 01/29/20 1437 01/29/20 1507   01/29/20 1600  dolutegravir (TIVICAY) tablet 50 mg  Status:  Discontinued        50 mg Oral Daily 01/29/20 1437 01/29/20 1507   01/29/20 1415  bictegravir-emtricitabine-tenofovir AF (BIKTARVY) 50-200-25 MG per tablet 1 tablet  Status:  Discontinued        1 tablet Oral Daily 01/29/20 1408 01/29/20 1437   01/29/20 1045  amoxicillin-clavulanate (AUGMENTIN) 875-125 MG per tablet 1 tablet  Status:  Discontinued        1 tablet Oral 2 times daily 01/29/20 1041 01/29/20 1510   01/29/20 1045  bictegravir-emtricitabine-tenofovir AF (BIKTARVY) 50-200-25 MG per tablet 1 tablet  Status:  Discontinued        1 tablet Oral Daily 01/29/20 1041 01/29/20 1042   01/29/20 0913  ceFAZolin (  ANCEF) 2-4 GM/100ML-% IVPB       Note to Pharmacy: Tressie Stalker   :  cabinet override      01/29/20 0913 01/29/20 2114       Time spent: 20 minutes    Erin Hearing ANP  [facility] Pager [phone]. If 7PM-7AM, please contact night-coverage at [email or web address] 03/05/2020, 10:59 AM  LOS: 36 days

## 2020-03-05 NOTE — Progress Notes (Signed)
Occupational Therapy Treatment Patient Details Name: Amber Lopez MRN: [id number] DOB: 02-Dec-1940 Today's Date: 03/05/2020    History of present illness 79 y.o. female with a PMHx of depression, HLD, HIV, HTN and DM presenting with acute onset of left hemiplegia, left facial droop, right eye deviation and aphasia. Pt received IV tPA and underwent R MCA revacularization on 01/29/2020. Pt also found to have large left pleural effusion with near complete L lung collapse and L apical mass. Chest tube placed on 01/31/2020. s/p trach on 02/11/20.    OT comments  Pt with soft pillow splint applied to L UE this session requested from NP Lissa Merlin. Pt tolerating splint at this time. Repositioned in bed with PMV to attempt to communicate. Pt with visual attention to the R side briefly for 5 seconds at at time during session with verbal cues. Pt does not participate in grooming task offered.    Follow Up Recommendations  SNF    Equipment Recommendations  Hospital bed;Wheelchair cushion (measurements OT);Wheelchair (measurements OT);Other (comment)    Recommendations for Other Services      Precautions / Restrictions Precautions Precautions: Fall Precaution Comments: trach and flexiseal       Mobility Bed Mobility Overal bed mobility: Needs Assistance Bed Mobility: Rolling Rolling: Total assist         General bed mobility comments: unable to fully achieve roll with one person  Transfers                      Balance                                           ADL either performed or assessed with clinical judgement   ADL Overall ADL's : Needs assistance/impaired     Grooming: Wash/dry face;Total assistance Grooming Details (indicate cue type and reason): does not attempt to assist                               General ADL Comments: positioned in bed total (A) with bed tilted     Vision       Perception     Praxis      Cognition  Arousal/Alertness: Awake/alert Behavior During Therapy: Flat affect Overall Cognitive Status: Impaired/Different from baseline                                 General Comments: pt speaking but not about anything and at times unable to understand the words she is saying. pt mumbling and talking to herself with the don of PMV. pt makes a face with PMV don and then starts to ramble to herself in conversation.         Exercises Other Exercises Other Exercises: don Small soft pillow splint after PROM and assessment to check skin in one hour. Pt tolerating at this time   Shoulder Instructions       General Comments Pt with R gaze preference     Pertinent Vitals/ Pain       Pain Assessment: Faces Faces Pain Scale: Hurts a little bit Pain Location: generalized with movement Pain Descriptors / Indicators: Grimacing;Discomfort Pain Intervention(s): Monitored during session;Repositioned  Home Living  Prior Functioning/Environment              Frequency  Min 2X/week        Progress Toward Goals  OT Goals(current goals can now be found in the care plan section)  Progress towards OT goals: Not progressing toward goals - comment  Acute Rehab OT Goals Patient Stated Goal: did not state OT Goal Formulation: With patient Time For Goal Achievement: 03/19/20 Potential to Achieve Goals: Fair ADL Goals Pt Will Perform Grooming: with min assist;sitting Pt Will Perform Upper Body Bathing: with mod assist;sitting Pt Will Transfer to Toilet: with max assist;with +2 assist;bedside commode Additional ADL Goal #1: Pt will locate ADL items at midline with min cues Additional ADL Goal #2: Family will be independent with PROM/positioning Lt UE  Plan Discharge plan remains appropriate    Co-evaluation                 AM-PAC OT "6 Clicks" Daily Activity     Outcome Measure   Help from another person eating  meals?: Total Help from another person taking care of personal grooming?: Total Help from another person toileting, which includes using toliet, bedpan, or urinal?: Total Help from another person bathing (including washing, rinsing, drying)?: Total Help from another person to put on and taking off regular upper body clothing?: Total Help from another person to put on and taking off regular lower body clothing?: Total 6 Click Score: 6    End of Session Equipment Utilized During Treatment: Oxygen  OT Visit Diagnosis: Hemiplegia and hemiparesis Hemiplegia - Right/Left: Left Hemiplegia - dominant/non-dominant: Non-Dominant Hemiplegia - caused by: Cerebral infarction   Activity Tolerance Patient tolerated treatment well   Patient Left in bed;with call bell/phone within reach;Other (comment) (L UE pillow splint)   Nurse Communication Mobility status;Precautions        Time: 5784-6962 OT Time Calculation (min): 42 min  Charges: OT General Charges $OT Visit: 1 Visit OT Treatments $Self Care/Home Management : 23-37 mins $Orthotics Fit/Training: 8-22 mins   Brynn, OTR/L  Acute Rehabilitation Services Pager: [phone] Office: [phone] .    Jeri Modena 03/05/2020, 2:35 PM

## 2020-03-05 NOTE — Plan of Care (Signed)
  Problem: Education: Goal: Knowledge of General Education information will improve Description: Including pain rating scale, medication(s)/side effects and non-pharmacologic comfort measures Outcome: Progressing   Problem: Elimination: Goal: Will not experience complications related to bowel motility Outcome: Progressing Goal: Will not experience complications related to urinary retention Outcome: Progressing   Problem: Safety: Goal: Ability to remain free from injury will improve Outcome: Progressing   Problem: Ischemic Stroke/TIA Tissue Perfusion: Goal: Complications of ischemic stroke/TIA will be minimized Outcome: Progressing

## 2020-03-05 NOTE — Progress Notes (Signed)
RN STAFF  Patient has soft elbow splint for __Left_ Elbow - Please have patient wear at night.  Gently stretch ___left _______elbow and don elbow splint **(elevate PRN to allow for good alignment of elbow, wrist and hand)  Allow patient to wear for 6 hours at night **(no more than 6 hours)**  After taking splint off, please perform skin check to assess for: pain, redness, swelling  If any symptoms present; monitor for 15 min and re-assess. If symptoms continue please contact acute OT dept: [phone]  Thank you! - OT   Straps should be away from the body as pictured.    Fleeta Emmer, OTR/L  Acute Rehabilitation Services Pager: [phone] Office: [phone] .

## 2020-03-05 NOTE — Progress Notes (Signed)
NAME:  Amber Lopez, MRN:  [id number], DOB:  [birth year], LOS: 69 ADMISSION DATE:  01/29/2020, CONSULTATION DATE:  10/7 REFERRING MD:  Estanislado Pandy, CHIEF COMPLAINT:  Left Sided Weakness   Brief History   79 y/o F, with HIV, admitted 10/7 with LVO of R M1 s/p tPA, neuro IR revascularization.  Returned to ICU post procedure on mechanical ventilation. Additional finding of left superior sulcus mass and complex effusion.   Past Medical History  HIV/AIDS, HTN, HLD, DM, Depression , Arthritis, Breast Cancer   Significant Hospital Events   10/07 Admit with L hemiplegia, facial droop, aphasia with right eye deviation 10/11 Episode of worsened R eye deviation with tachycardia and HTN, concern for sz  10/20 trach placed 10/28 Failed PSV due to tachypnea.   10/29 PEG placed 10/30-10/31: placed on atc tolerated well.   Consults:  Neurology, IR  Oncology 10/13 Palliative care 10/13  Procedures:  ETT 10/7 >> 10/20 L Radial ALine 10/7 >>  out R Femoral Sheath 10/7 >> out Trach 10/20 >>  Significant Diagnostic Tests:  10/7 CT Head Code Stroke >> no acute finding, generalized atrophy  10/7 CTA Head/Neck >> emergent LVO at the right M1 segment, no visible embolic source, atherosclerosis without flow limiting stenosis or ulceration of major vessels, left superior sulcus mass with large complex pleural effusion, associated mediastinal adenopathy  10/7 CT ABD w/contrast >> Negative for hematoma 10/7 CT Chest w/contrast >> Persistent large left pleural effusion with near complete left lung collapse. Left apical mass measuring 3.5 x 3.3 cm suspicious for bronchogenic carcinoma 10/11 CT head >> no acute findings 10/11 EEG >> Continuous slow, generalized and maximal right frontotemporal region  Micro Data:  COVID 10/7 >> negative  Influenza A/B 10/7 >> negative Pleural fluid culture>>no growth three days 10/9 BCx2>> 1/2 with gram positives 10/7 MRSA screen>>negative 10/11 BC x 1 (from left PICC) >  staph hominis 1/2 10/14 trach asp >> few WBC, rare G+ cocci>> few stenotrophomonas maltophilia S to levaquin and bactrim  10/14 BC >> negative  Antimicrobials:  Cefazolin 10/7 x1 Dolutegravir/ emtricitabine/ tenofovir 10/8 >> Vanc 10/14 >> 10/27 Cefepime 10/14 > 10/20  Interim history/subjective:  No events, stable on TC.  Objective   Blood pressure (!) 138/55, pulse (!) 104, temperature 99 F (37.2 C), temperature source Oral, resp. rate 20, height 4\' 10"  (1.473 m), weight 56 kg, SpO2 100 %.    FiO2 (%):  [21 %-28 %] 21 %   Intake/Output Summary (Last 24 hours) at 03/05/2020 1127 Last data filed at 03/05/2020 0026 Gross per 24 hour  Intake 100 ml  Output 800 ml  Net -700 ml   Filed Weights   03/02/20 0500 03/03/20 0331 03/05/20 0500  Weight: 58.2 kg 58.6 kg 56 kg    Examination: Constitutional: chronically il woman in no acute distress Eyes: eyes are anicteric, reactive to light Ears, nose, mouth, and throat: tracheostomy in place with thick secretions Cardiovascular: heart sounds are regular, ext are warm to touch. 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

## 2020-03-06 DIAGNOSIS — I63511 Cerebral infarction due to unspecified occlusion or stenosis of right middle cerebral artery: Secondary | ICD-10-CM | POA: Diagnosis not present

## 2020-03-06 LAB — GLUCOSE, CAPILLARY
Glucose-Capillary: 171 mg/dL — ABNORMAL HIGH (ref 70–99)
Glucose-Capillary: 198 mg/dL — ABNORMAL HIGH (ref 70–99)
Glucose-Capillary: 211 mg/dL — ABNORMAL HIGH (ref 70–99)
Glucose-Capillary: 216 mg/dL — ABNORMAL HIGH (ref 70–99)
Glucose-Capillary: 250 mg/dL — ABNORMAL HIGH (ref 70–99)
Glucose-Capillary: 288 mg/dL — ABNORMAL HIGH (ref 70–99)
Glucose-Capillary: 93 mg/dL (ref 70–99)

## 2020-03-06 NOTE — Progress Notes (Signed)
PROGRESS NOTE    Amber Lopez   IHK:[id number]  DOB: 1940/07/27  DOA: 01/29/2020     37  PCP: Billie Ruddy, MD  CC: AMS  Hospital Course: 79 year old female with past medical history for HIV/AIDS, hypertension, dyslipidemia, diabetes, depression, arthritis and breast cancer.  Patient presented to the ER on 10/7 with strokelike symptoms consisting of left hemiplegia, facial droop, aphasia and right ideation.  CTA head neck showed emergent LVO at the right M1 segment with patient subsequently undergoing TPA and revascularization by neurology and IR.  She was admitted to the ICU postprocedure on mechanical ventilation.  Unfortunately patient's mentation and neurological status remained poor.  She was not able to follow commands and she was unable to wean from the ventilator.  Tracheostomy tube was placed on 10/20 and a PEG tube was placed on 10/29.  Unfortunately her neurological status has not improved.  Other complications since admission include a large left pleural effusion/left apical mass with pathology assistant with metastatic adenocarcinoma.  She has been denied for LTAC placement.  Palliative care has been discussing with patient family her poor prognosis and initially family requested patient be transferred to another facility but no clinical reason to transfer therefore patient remains facility.  Interval History:  No events overnight. Resting in bed comfortable this am. Not able to follow any commands for me this morning but did for nurse.   Old records reviewed in assessment of this patient  ROS: Review of systems not obtained due to patient factors. 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 -Recent issues with acute encephalopathy and lack of response as well as left hemiparesis-as of 11/12 she is more awake and following commands and attempting to vocalize over trach -Noted with  flexion contracture left upper extremity-we will continue PT and OT -Continue SLP both for swallowing and speech and cognition  Left upper extremity contracture -Continue PT and OT -OT recommended soft pillow splint 6 hours every HS  Acute ventilatory dependent respiratory failure with hypoxemia/Inability to clear secretions/requires tracheostomy tube -Respiratory status stable on 21% FiO2 -11/8 transitioned to cuffless trach -Speech therapy evaluation. MBS 11/2 done. Dysphagia 1 diet recommended but patient only tolerating small bites of food. -Use PMV with all oral intake. -Continue SLP as above -Trach team recommends trial of CPT and yulperi to aid with secretions-ready for capping trials at this juncture  Adenocarcinoma of the left lung with left pleural effusion  -CT of chest on admission showed persistent large left pleural effusion with near complete left lung collapse. Left apical mass measuring 3.5 x 3.3 cm suspicious for bronchogenic carcinoma -Pleural fluid pathology consistent with metastatic adenocarcinoma -poor performance status precludes chemotherapy with metastatic disease  -Patient will follow with oncology as an outpatient if improves clinically  Dysphagia/ Nutrition Status: Nutrition Problem: Moderate Malnutrition Etiology: chronic illness (HIV) Signs/Symptoms: mild muscle depletion, moderate muscle depletion, mild fat depletion, moderate fat depletion Interventions: Ensure Enlive (each supplement provides 350kcal and 20 grams of protein), Tube feeding, MVI  Estimated body mass index is 25.8 kg/m as calculated from the following:   Height as of this encounter: 4\' 10"  (1.473 m).   Weight as of this encounter: 56 kg.  Acute diarrhea -Ongoing since 11/6 prompting discontinuation of laxative, fiber supplement (upon review of MAR 11/10 it appears Colace scheduled remained on medications therefore it was discontinued) -11/10 Imodium initiated with decrease in  diarrhea volume-suspect may be  related to tube feeding-continue to follow and consider Bentyl versus changing tube feeding  Jenkins  -Family had requested transfer to another facility when informed poor prognosis. At this time no clinical reason to transfer to other facility. Palliative care consult appreciated. -LTAC denied -Case management working to find SNF  Decubitus ulcer not POA     Pressure Injury 02/16/20 Perineum Right Stage 2 -  Partial thickness loss of dermis presenting as a shallow open injury with a red, pink wound bed without slough. 2cm x 1cm pink (Active)  Date First Assessed/Time First Assessed: 02/16/20 1200   Location: Perineum  Location Orientation: Right  Staging: Stage 2 -  Partial thickness loss of dermis presenting as a shallow open injury with a red, pink wound bed without slough.  Wound Descri...    Assessments 02/16/2020 12:00 PM 03/04/2020  7:48 PM  Dressing Type Moisture barrier Foam - Lift dressing to assess site every shift  Site / Wound Assessment Clean;Pink -  Peri-wound Assessment Intact -  Wound Length (cm) 2 cm -  Wound Width (cm) 1 cm -  Wound Depth (cm) 0 cm -  Wound Surface Area (cm^2) 2 cm^2 -  Wound Volume (cm^3) 0 cm^3 -  Margins Unattached edges (unapproximated) -  Drainage Amount Scant -  Treatment Cleansed -     No Linked orders to display    Other problems: Hypertension/left ventricular diastolic dysfunction -Heart rate and blood pressure stable on Coreg. -No clinical signs consistent with heart failure  HIV/AIDS -Continue home medications including: atovaquone, dolutegravir and descovy; discussing with pharmacy and ID about transitioning to more affordable option since we are pursuing SNF care and expensive medications may be barrier to placement  Type 2 diabetes mellitus -A1c 6.5 on 10/7. -Currently stable on 15 units Levemir twice daily with sliding scale insulin.  Chronic normocytic anemia -Hemoglobin stable at 9.0    Antimicrobials: none  DVT prophylaxis: Lovenox Code Status: DNR Family Communication: none present Disposition Plan: Status is: Inpatient  Remains inpatient appropriate because:Altered mental status, Unsafe d/c plan and Inpatient level of care appropriate due to severity of illness   Dispo: The patient is from: Home              Anticipated d/c is to: SNF              Anticipated d/c date is: > 3 days              Patient currently is medically stable to d/c.       Objective: Blood pressure 128/87, pulse 86, temperature 98.2 F (36.8 C), temperature source Axillary, resp. rate 20, height 4\' 10"  (1.473 m), weight 56 kg, SpO2 96 %.  Examination: General appearance: chronically ill appearing woman laying in bed in no distress staring to the right Head: Normocephalic, without obvious abnormality, atraumatic Eyes: spontaneously open, no scleral redness or icterus Lungs: clear to auscultation bilaterally Heart: regular rate and rhythm and S1, S2 normal Abdomen: normal findings: bowel sounds normal and PED in place Extremities: no edema Skin: mobility and turgor normal Neurologic: withdrawals to pain in all 4 extremities  Consultants:     Procedures:     Data Reviewed: I have personally reviewed following labs and imaging studies Results for orders placed or performed during the hospital encounter of 01/29/20 (from the past 24 hour(s))  Glucose, capillary     Status: Abnormal   Collection Time: 03/05/20  4:19 PM  Result Value Ref Range   Glucose-Capillary 117 (  H) 70 - 99 mg/dL   Comment 1 Notify RN    Comment 2 Document in Chart   Glucose, capillary     Status: Abnormal   Collection Time: 03/05/20  8:01 PM  Result Value Ref Range   Glucose-Capillary 211 (H) 70 - 99 mg/dL   Comment 1 Notify RN    Comment 2 Document in Chart   Glucose, capillary     Status: Abnormal   Collection Time: 03/06/20 12:28 AM  Result Value Ref Range   Glucose-Capillary 198 (H) 70 - 99  mg/dL   Comment 1 Notify RN    Comment 2 Document in Chart   Glucose, capillary     Status: Abnormal   Collection Time: 03/06/20  4:57 AM  Result Value Ref Range   Glucose-Capillary 216 (H) 70 - 99 mg/dL   Comment 1 Notify RN    Comment 2 Document in Chart   Glucose, capillary     Status: Abnormal   Collection Time: 03/06/20  7:21 AM  Result Value Ref Range   Glucose-Capillary 250 (H) 70 - 99 mg/dL  Glucose, capillary     Status: Abnormal   Collection Time: 03/06/20 11:49 AM  Result Value Ref Range   Glucose-Capillary 288 (H) 70 - 99 mg/dL    No results found for this or any previous visit (from the past 240 hour(s)).   Radiology Studies: No results found. DG Chest 2 View  Final Result    DG Swallowing Func-Speech Pathology  Final Result    DG CHEST PORT 1 VIEW  Final Result    DG Chest Port 1 View  Final Result    DG Chest Port 1 View  Final Result    DG CHEST PORT 1 VIEW  Final Result    DG CHEST PORT 1 VIEW  Final Result    DG CHEST PORT 1 VIEW  Final Result    DG Chest Port 1 View  Final Result    [place] EKG SITE RITE  Final Result    DG CHEST PORT 1 VIEW  Final Result    CT HEAD WO CONTRAST  Final Result    CT HEAD WO CONTRAST  Final Result    DG Chest 1 View  Final Result    CT CHEST W CONTRAST  Final Result    CT ABDOMEN PELVIS W CONTRAST  Final Result    DG Abd 1 View  Final Result    Portable Chest xray  Final Result    MR BRAIN WO CONTRAST  Final Result    DG Abd 1 View  Final Result    Portable Chest x-ray  Final Result    IR PERCUTANEOUS ART THROMBECTOMY/INFUSION INTRACRANIAL INC DIAG ANGIO  Final Result    IR CT Head Ltd  Final Result    CT Code Stroke CTA Head W/WO contrast  Final Result    CT Code Stroke CTA Neck W/WO contrast  Final Result    CT HEAD CODE STROKE WO CONTRAST  Final Result      Scheduled Meds: . aspirin  81 mg Per Tube Daily  . atovaquone  1,500 mg Per Tube Q breakfast  . carvedilol   3.125 mg Per Tube BID WC  . chlorhexidine  15 mL Mouth Rinse BID  . Chlorhexidine Gluconate Cloth  6 each Topical Daily  . dolutegravir  50 mg Oral Daily  . emtricitabine-tenofovir AF  1 tablet Oral Daily  . enoxaparin (LOVENOX) injection  40 mg Subcutaneous  Q24H  . feeding supplement  237 mL Oral BID BM  . feeding supplement (OSMOLITE 1.5 CAL)  780 mL Per Tube Q24H  . influenza vaccine adjuvanted  0.5 mL Intramuscular Tomorrow-1000  . insulin aspart  0-15 Units Subcutaneous Q4H  . insulin detemir  15 Units Subcutaneous BID  . loperamide HCl  2 mg Per Tube Q12H  . mouth rinse  15 mL Mouth Rinse q12n4p  . multivitamin  15 mL Per Tube Daily  . pantoprazole sodium  40 mg Per Tube QHS  . revefenacin  175 mcg Nebulization Daily  . rosuvastatin  10 mg Per NG tube Daily   PRN Meds: Place/Maintain arterial line **AND** sodium chloride, acetaminophen **OR** acetaminophen (TYLENOL) oral liquid 160 mg/5 mL **OR** acetaminophen, labetalol Continuous Infusions: . sodium chloride 250 mL (02/11/20 2212)  . sodium chloride Stopped (02/09/20 0556)     LOS: 37 days  Time spent: Greater than 50% of the 35 minute visit was spent in counseling/coordination of care for the patient as laid out in the A&P.   Dwyane Dee, MD [facility] 03/06/2020, 3:07 PM

## 2020-03-06 NOTE — Progress Notes (Addendum)
Occupational Therapy Treatment Patient Details Name: Amber Lopez MRN: [id number] DOB: 1940-06-27 Today's Date: 03/06/2020    History of present illness 79 y.o. female with a PMHx of depression, HLD, HIV, HTN and DM presenting with acute onset of left hemiplegia, left facial droop, right eye deviation and aphasia. Pt received IV tPA and underwent R MCA revacularization on 01/29/2020. Pt also found to have large left pleural effusion with near complete L lung collapse and L apical mass. Chest tube placed on 01/31/2020. s/p trach on 02/11/20.    OT comments  Elbow splint in place and was removed.  Skin checked for evidence of pressure with none detected.  PROM/stretch performed Lt elbow.  Continue wear of Lt elbow splint as per established schedule.    Follow Up Recommendations  SNF    Equipment Recommendations  Hospital bed;Wheelchair cushion (measurements OT);Wheelchair (measurements OT);Other (comment)    Recommendations for Other Services      Precautions / Restrictions Precautions Precautions: Fall Precaution Comments: trach and flexiseal       Mobility Bed Mobility                  Transfers                      Balance                                           ADL either performed or assessed with clinical judgement   ADL                                               Vision       Perception     Praxis      Cognition Arousal/Alertness: Awake/alert Behavior During Therapy: Flat affect Overall Cognitive Status: Impaired/Different from baseline Area of Impairment: Attention;Following commands                   Current Attention Level: Focused   Following Commands: Follows one step commands with increased time;Follows one step commands inconsistently                Exercises Other Exercises Other Exercises: elbow splint in place.  It was removed and skin inspected with no evidence of redness  or pressure.  PROM of elbow performed with passive stretch to ~-25*.      Shoulder Instructions       General Comments      Pertinent Vitals/ Pain       Pain Assessment: Faces Faces Pain Scale: Hurts a little bit Pain Location: Lt UE with elbow extension  Pain Descriptors / Indicators: Grimacing Pain Intervention(s): Monitored during session  Home Living                                          Prior Functioning/Environment              Frequency  Min 2X/week        Progress Toward Goals  OT Goals(current goals can now be found in the care plan section)  Progress towards OT goals: Progressing toward goals (tolerating splinting )  Plan Discharge plan remains appropriate    Co-evaluation                 AM-PAC OT "6 Clicks" Daily Activity     Outcome Measure   Help from another person eating meals?: Total Help from another person taking care of personal grooming?: Total Help from another person toileting, which includes using toliet, bedpan, or urinal?: Total Help from another person bathing (including washing, rinsing, drying)?: Total Help from another person to put on and taking off regular upper body clothing?: Total Help from another person to put on and taking off regular lower body clothing?: Total 6 Click Score: 6    End of Session Equipment Utilized During Treatment: Oxygen  OT Visit Diagnosis: Hemiplegia and hemiparesis Hemiplegia - Right/Left: Left Hemiplegia - dominant/non-dominant: Non-Dominant Hemiplegia - caused by: Cerebral infarction   Activity Tolerance Patient tolerated treatment well   Patient Left in bed;with call bell/phone within reach;Other (comment);with nursing/sitter in room   Nurse Communication Mobility status        Time: [phone] OT Time Calculation (min): 8 min  Charges: OT General Charges $OT Visit: 1 Visit OT Treatments $Orthotics Fit/Training: 8-22 mins  Nilsa Nutting., OTR/L Acute  Rehabilitation Services Pager [phone] Office [phone]    Lucille Passy M 03/06/2020, 8:44 AM

## 2020-03-07 DIAGNOSIS — J9601 Acute respiratory failure with hypoxia: Secondary | ICD-10-CM | POA: Diagnosis not present

## 2020-03-07 DIAGNOSIS — Z930 Tracheostomy status: Secondary | ICD-10-CM | POA: Diagnosis not present

## 2020-03-07 DIAGNOSIS — I63511 Cerebral infarction due to unspecified occlusion or stenosis of right middle cerebral artery: Secondary | ICD-10-CM | POA: Diagnosis not present

## 2020-03-07 LAB — GLUCOSE, CAPILLARY
Glucose-Capillary: 183 mg/dL — ABNORMAL HIGH (ref 70–99)
Glucose-Capillary: 211 mg/dL — ABNORMAL HIGH (ref 70–99)
Glucose-Capillary: 237 mg/dL — ABNORMAL HIGH (ref 70–99)
Glucose-Capillary: 42 mg/dL — CL (ref 70–99)
Glucose-Capillary: 100 mg/dL — ABNORMAL HIGH (ref 70–99)
Glucose-Capillary: 47 mg/dL — ABNORMAL LOW (ref 70–99)
Glucose-Capillary: 66 mg/dL — ABNORMAL LOW (ref 70–99)
Glucose-Capillary: 253 mg/dL — ABNORMAL HIGH (ref 70–99)
Glucose-Capillary: 238 mg/dL — ABNORMAL HIGH (ref 70–99)

## 2020-03-07 MED ORDER — DEXTROSE 50 % IV SOLN
INTRAVENOUS | Status: AC
  Filled 2020-03-07: qty 50

## 2020-03-07 NOTE — Progress Notes (Signed)
PROGRESS NOTE    Amber Lopez   JOI:[id number]  DOB: 01-11-1941  DOA: 01/29/2020     38  PCP: Billie Ruddy, MD  CC: AMS  Hospital Course: 79 year old female with past medical history for HIV/AIDS, hypertension, dyslipidemia, diabetes, depression, arthritis and breast cancer.  Patient presented to the ER on 10/7 with strokelike symptoms consisting of left hemiplegia, facial droop, aphasia and right ideation.  CTA head neck showed emergent LVO at the right M1 segment with patient subsequently undergoing TPA and revascularization by neurology and IR.  She was admitted to the ICU postprocedure on mechanical ventilation.  Unfortunately patient's mentation and neurological status remained poor.  She was not able to follow commands and she was unable to wean from the ventilator.  Tracheostomy tube was placed on 10/20 and a PEG tube was placed on 10/29.  Unfortunately her neurological status has not improved.  Other complications since admission include a large left pleural effusion/left apical mass with pathology assistant with metastatic adenocarcinoma.  She has been denied for LTAC placement.  Palliative care has been discussing with patient family her poor prognosis and initially family requested patient be transferred to another facility but no clinical reason to transfer therefore patient remains facility.  Interval History:  No events overnight. Son bedside this morning. He asked if her PEG tube was in/working and I stated yes.  She is not very interactive this morning and did not follow any commands for me. Did open her eyes some.   Old records reviewed in assessment of this patient  ROS: Review of systems not obtained due to patient factors. 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 -Recent issues with acute encephalopathy and lack of response as well as left hemiparesis- she is  intermittently more alert some days -Noted with flexion contracture left upper extremity-we will continue PT and OT -Continue SLP both for swallowing and speech and cognition  Left upper extremity contracture -Continue PT and OT -OT recommended soft pillow splint 6 hours every HS  Acute ventilatory dependent respiratory failure with hypoxemia/Inability to clear secretions/requires tracheostomy tube -Respiratory status stable on trach -11/8 transitioned to cuffless trach -Speech therapy evaluation. MBS 11/2 done. Dysphagia 1 diet recommended but patient only tolerating small bites of food. -Use PMV with all oral intake. -Continue SLP as above -Trach team recommends trial of CPT and yulperi to aid with secretions-ready for capping trials at this juncture  Adenocarcinoma of the left lung with left pleural effusion  -CT of chest on admission showed persistent large left pleural effusion with near complete left lung collapse. Left apical mass measuring 3.5 x 3.3 cm suspicious for bronchogenic carcinoma -Pleural fluid pathology consistent with metastatic adenocarcinoma -poor performance status precludes chemotherapy with metastatic disease  -Patient will follow with oncology as an outpatient if improves clinically - overall she is hospice appropriate with poor prognosis/outcome; current aggressive medical care is per family decisions  Dysphagia/ Nutrition Status: Nutrition Problem: Moderate Malnutrition Etiology: chronic illness (HIV) Signs/Symptoms: mild muscle depletion, moderate muscle depletion, mild fat depletion, moderate fat depletion Interventions: Ensure Enlive (each supplement provides 350kcal and 20 grams of protein), Tube feeding, MVI  Estimated body mass index is 25.8 kg/m as calculated from the following:   Height as of this encounter: 4\' 10"  (1.473 m).   Weight as of this encounter: 56 kg.  Acute diarrhea -Ongoing since 11/6 prompting discontinuation of laxative, fiber  supplement (upon  review of MAR 11/10 it appears Colace scheduled remained on medications therefore it was discontinued) -11/10 Imodium initiated with decrease in diarrhea volume-suspect may be related to tube feeding-continue to follow and consider Bentyl versus changing tube feeding  [place]  -Family had requested transfer to another facility when informed poor prognosis. At this time no clinical reason to transfer to other facility. Palliative care consult appreciated. -LTAC denied -Case management working to find SNF  Decubitus ulcer not POA     Pressure Injury 02/16/20 Perineum Right Stage 2 -  Partial thickness loss of dermis presenting as a shallow open injury with a red, pink wound bed without slough. 2cm x 1cm pink (Active)  Date First Assessed/Time First Assessed: 02/16/20 1200   Location: Perineum  Location Orientation: Right  Staging: Stage 2 -  Partial thickness loss of dermis presenting as a shallow open injury with a red, pink wound bed without slough.  Wound Descri...    Assessments 02/16/2020 12:00 PM 03/04/2020  7:48 PM  Dressing Type Moisture barrier Foam - Lift dressing to assess site every shift  Site / Wound Assessment Clean;Pink --  Peri-wound Assessment Intact --  Wound Length (cm) 2 cm --  Wound Width (cm) 1 cm --  Wound Depth (cm) 0 cm --  Wound Surface Area (cm^2) 2 cm^2 --  Wound Volume (cm^3) 0 cm^3 --  Margins Unattached edges (unapproximated) --  Drainage Amount Scant --  Treatment Cleansed --     No Linked orders to display    Other problems: Hypertension/left ventricular diastolic dysfunction -Heart rate and blood pressure stable on Coreg. -No clinical signs consistent with heart failure  HIV/AIDS -Continue home medications including: atovaquone, dolutegravir and descovy; discussing with pharmacy and ID about transitioning to more affordable option since we are pursuing SNF care and expensive medications may be barrier to placement  Type 2  diabetes mellitus -A1c 6.5 on 10/7. -Currently stable on 15 units Levemir twice daily with sliding scale insulin.  Chronic normocytic anemia -Hemoglobin stable at 9.0   Antimicrobials: none  DVT prophylaxis: Lovenox Code Status: DNR Family Communication: none present Disposition Plan: Status is: Inpatient  Remains inpatient appropriate because:Altered mental status, Unsafe d/c plan and Inpatient level of care appropriate due to severity of illness   Dispo: The patient is from: Home              Anticipated d/c is to: SNF              Anticipated d/c date is: > 3 days              Patient currently is medically stable to d/c.  Objective: Blood pressure (!) 145/71, pulse 89, temperature 99.2 F (37.3 C), temperature source Axillary, resp. rate 20, height 4\' 10"  (1.473 m), weight 56.8 kg, SpO2 100 %.  Examination: General appearance: chronically ill appearing woman laying in bed in no distress staring to the right Head: Normocephalic, without obvious abnormality, atraumatic Eyes: spontaneously open, no scleral redness or icterus Lungs: clear to auscultation bilaterally Heart: regular rate and rhythm and S1, S2 normal Abdomen: normal findings: bowel sounds normal and PED in place Extremities: no edema Skin: mobility and turgor normal Neurologic: left hemineglect persists; some response to noxious stimulii  Consultants:     Procedures:     Data Reviewed: I have personally reviewed following labs and imaging studies Results for orders placed or performed during the hospital encounter of 01/29/20 (from the past 24 hour(s))  Glucose, capillary  Status: None   Collection Time: 03/06/20  3:41 PM  Result Value Ref Range   Glucose-Capillary 93 70 - 99 mg/dL  Glucose, capillary     Status: Abnormal   Collection Time: 03/06/20  8:12 PM  Result Value Ref Range   Glucose-Capillary 211 (H) 70 - 99 mg/dL   Comment 1 Notify RN    Comment 2 Document in Chart   Glucose,  capillary     Status: Abnormal   Collection Time: 03/06/20 11:48 PM  Result Value Ref Range   Glucose-Capillary 171 (H) 70 - 99 mg/dL   Comment 1 Notify RN    Comment 2 Document in Chart   Glucose, capillary     Status: Abnormal   Collection Time: 03/07/20  3:44 AM  Result Value Ref Range   Glucose-Capillary 183 (H) 70 - 99 mg/dL   Comment 1 Notify RN    Comment 2 Document in Chart   Glucose, capillary     Status: Abnormal   Collection Time: 03/07/20  7:43 AM  Result Value Ref Range   Glucose-Capillary 211 (H) 70 - 99 mg/dL  Glucose, capillary     Status: Abnormal   Collection Time: 03/07/20 12:26 PM  Result Value Ref Range   Glucose-Capillary 237 (H) 70 - 99 mg/dL    No results found for this or any previous visit (from the past 240 hour(s)).   Radiology Studies: No results found. DG Chest 2 View  Final Result    DG Swallowing Func-Speech Pathology  Final Result    DG CHEST PORT 1 VIEW  Final Result    DG Chest Port 1 View  Final Result    DG Chest Port 1 View  Final Result    DG CHEST PORT 1 VIEW  Final Result    DG CHEST PORT 1 VIEW  Final Result    DG CHEST PORT 1 VIEW  Final Result    DG Chest Port 1 View  Final Result    [place] EKG SITE RITE  Final Result    DG CHEST PORT 1 VIEW  Final Result    CT HEAD WO CONTRAST  Final Result    CT HEAD WO CONTRAST  Final Result    DG Chest 1 View  Final Result    CT CHEST W CONTRAST  Final Result    CT ABDOMEN PELVIS W CONTRAST  Final Result    DG Abd 1 View  Final Result    Portable Chest xray  Final Result    MR BRAIN WO CONTRAST  Final Result    DG Abd 1 View  Final Result    Portable Chest x-ray  Final Result    IR PERCUTANEOUS ART THROMBECTOMY/INFUSION INTRACRANIAL INC DIAG ANGIO  Final Result    IR CT Head Ltd  Final Result    CT Code Stroke CTA Head W/WO contrast  Final Result    CT Code Stroke CTA Neck W/WO contrast  Final Result    CT HEAD CODE STROKE WO CONTRAST   Final Result      Scheduled Meds: . aspirin  81 mg Per Tube Daily  . atovaquone  1,500 mg Per Tube Q breakfast  . carvedilol  3.125 mg Per Tube BID WC  . chlorhexidine  15 mL Mouth Rinse BID  . Chlorhexidine Gluconate Cloth  6 each Topical Daily  . dolutegravir  50 mg Oral Daily  . emtricitabine-tenofovir AF  1 tablet Oral Daily  . enoxaparin (LOVENOX) injection  40 mg Subcutaneous Q24H  . feeding supplement  237 mL Oral BID BM  . feeding supplement (OSMOLITE 1.5 CAL)  780 mL Per Tube Q24H  . influenza vaccine adjuvanted  0.5 mL Intramuscular Tomorrow-1000  . insulin aspart  0-15 Units Subcutaneous Q4H  . insulin detemir  15 Units Subcutaneous BID  . loperamide HCl  2 mg Per Tube Q12H  . mouth rinse  15 mL Mouth Rinse q12n4p  . multivitamin  15 mL Per Tube Daily  . pantoprazole sodium  40 mg Per Tube QHS  . revefenacin  175 mcg Nebulization Daily  . rosuvastatin  10 mg Per NG tube Daily   PRN Meds: Place/Maintain arterial line **AND** sodium chloride, acetaminophen **OR** acetaminophen (TYLENOL) oral liquid 160 mg/5 mL **OR** acetaminophen, labetalol Continuous Infusions: . sodium chloride 250 mL (02/11/20 2212)  . sodium chloride Stopped (02/09/20 0556)     LOS: 38 days  Time spent: Greater than 50% of the 35 minute visit was spent in counseling/coordination of care for the patient as laid out in the A&P.   Dwyane Dee, MD [facility] 03/07/2020, 1:53 PM

## 2020-03-08 DIAGNOSIS — Z930 Tracheostomy status: Secondary | ICD-10-CM | POA: Diagnosis not present

## 2020-03-08 DIAGNOSIS — R1312 Dysphagia, oropharyngeal phase: Secondary | ICD-10-CM | POA: Diagnosis not present

## 2020-03-08 DIAGNOSIS — I63511 Cerebral infarction due to unspecified occlusion or stenosis of right middle cerebral artery: Secondary | ICD-10-CM | POA: Diagnosis not present

## 2020-03-08 DIAGNOSIS — J9601 Acute respiratory failure with hypoxia: Secondary | ICD-10-CM | POA: Diagnosis not present

## 2020-03-08 DIAGNOSIS — E440 Moderate protein-calorie malnutrition: Secondary | ICD-10-CM | POA: Diagnosis not present

## 2020-03-08 LAB — COMPREHENSIVE METABOLIC PANEL
ALT: 23 U/L (ref 0–44)
AST: 35 U/L (ref 15–41)
Albumin: 2.1 g/dL — ABNORMAL LOW (ref 3.5–5.0)
Alkaline Phosphatase: 79 U/L (ref 38–126)
Anion gap: 10 (ref 5–15)
BUN: 22 mg/dL (ref 8–23)
CO2: 27 mmol/L (ref 22–32)
Calcium: 8.9 mg/dL (ref 8.9–10.3)
Chloride: 95 mmol/L — ABNORMAL LOW (ref 98–111)
Creatinine, Ser: 1.04 mg/dL — ABNORMAL HIGH (ref 0.44–1.00)
GFR, Estimated: 55 mL/min — ABNORMAL LOW (ref >60)
Glucose, Bld: 157 mg/dL — ABNORMAL HIGH (ref 70–99)
Potassium: 5.4 mmol/L — ABNORMAL HIGH (ref 3.5–5.1)
Sodium: 132 mmol/L — ABNORMAL LOW (ref 135–145)
Total Bilirubin: 0.9 mg/dL (ref 0.3–1.2)
Total Protein: 7.5 g/dL (ref 6.5–8.1)

## 2020-03-08 LAB — GLUCOSE, CAPILLARY
Glucose-Capillary: 101 mg/dL — ABNORMAL HIGH (ref 70–99)
Glucose-Capillary: 169 mg/dL — ABNORMAL HIGH (ref 70–99)
Glucose-Capillary: 172 mg/dL — ABNORMAL HIGH (ref 70–99)
Glucose-Capillary: 212 mg/dL — ABNORMAL HIGH (ref 70–99)
Glucose-Capillary: 222 mg/dL — ABNORMAL HIGH (ref 70–99)
Glucose-Capillary: 79 mg/dL (ref 70–99)

## 2020-03-08 LAB — CBC
HCT: 29.7 % — ABNORMAL LOW (ref 36.0–46.0)
Hemoglobin: 9.4 g/dL — ABNORMAL LOW (ref 12.0–15.0)
MCH: 29.2 pg (ref 26.0–34.0)
MCHC: 31.6 g/dL (ref 30.0–36.0)
MCV: 92.2 fL (ref 80.0–100.0)
Platelets: 507 10*3/uL — ABNORMAL HIGH (ref 150–400)
RBC: 3.22 MIL/uL — ABNORMAL LOW (ref 3.87–5.11)
RDW: 15.5 % (ref 11.5–15.5)
WBC: 11.2 10*3/uL — ABNORMAL HIGH (ref 4.0–10.5)
nRBC: 0 % (ref 0.0–0.2)

## 2020-03-08 MED ORDER — FREE WATER
200.0000 mL | Freq: Three times a day (TID) | Status: DC
  Administered 2020-03-08 – 2020-03-17 (×28): 200 mL

## 2020-03-08 MED ORDER — DICYCLOMINE HCL 10 MG/5ML PO SOLN
10.0000 mg | Freq: Three times a day (TID) | ORAL | Status: DC
  Administered 2020-03-08 – 2020-03-09 (×3): 10 mg via ORAL
  Filled 2020-03-08 (×4): qty 5

## 2020-03-08 NOTE — Progress Notes (Addendum)
[facility] PROGRESS NOTE  Amber Lopez BMW:[id number] DOB: 09-26-1940 DOA: 01/29/2020 PCP: Billie Ruddy, MD  Status: Remains inpatient appropriate because:Altered mental status, Unsafe d/c plan and Trach placed this admission on 10/20 and needs to be in place 30 days before can be considered for SNF placement   Dispo: The patient is from: Home              Anticipated d/c is to: SNF              Anticipated d/c date is: > 3 days              Patient currently is medically stable to d/c.   Code Status: DNR Family Communication: 11/15 spoke with daughter [name] DVT prophylaxis: Lovenox Vaccination status: Patient completed both doses of Pfizer Covid vaccine on 09/01/2019  Foley catheter: No  HPI: 79 year old female with past medical history for HIV/AIDS, hypertension, dyslipidemia, diabetes, depression, arthritis and breast cancer.  Patient presented to the ER on 10/7 with strokelike symptoms consisting of left hemiplegia, facial droop, aphasia and right ideation.  CTA head neck showed emergent LVO at the right M1 segment with patient subsequently undergoing TPA and revascularization by neurology and IR.  She was admitted to the ICU postprocedure on mechanical ventilation.  Unfortunately patient's mentation and neurological status remained poor.  She was not able to follow commands and she was unable to wean from the ventilator.  Tracheostomy tube was placed on 10/20 and a PEG tube was placed on 10/29.  Unfortunately her neurological status has not improved.  Other complications since admission include a large left pleural effusion/left apical mass with pathology assistant with metastatic adenocarcinoma.  She has been denied for LTAC placement.  Palliative care has been discussing with patient family her poor prognosis and initially family requested patient be transferred to another facility but no clinical reason to transfer therefore patient remains facility.  Subjective: Awake  and alert.  Confirmed that she had excessive secretions in her mouth.  Attempted to get her to spit them out onto a tissue but she did not.  Attempted to perform oral suction but patient would not allow.  Nursing staff made aware.  Objective: Vitals:   03/08/20 0744 03/08/20 0751  BP: (!) 112/55   Pulse:  90  Resp: 18 18  Temp: 97.7 F (36.5 C)   SpO2: 100%    No intake or output data in the 24 hours ending 03/08/20 1028 Filed Weights   03/03/20 0331 03/05/20 0500 03/07/20 0500  Weight: 58.6 kg 56 kg 56.8 kg    Exam:  Constitutional: NAD, calm, comfortable Respiratory: clear to auscultation bilaterally -no obvious excessive secretions from trach.  Normal respiratory effort. Changed to #6 cuffless trach on 11/8 Cardiovascular: Regular rate and rhythm, No lower extremity edema. 2+ pedal pulses. .  Abdomen: no obvious tenderness, Bowel sounds positive.  G-tube in place with tube feeding infusing.  Flexi-Seal in place draining liquid brown stool to bedside bag Musculoskeletal: No joint deformity upper and lower extremities.  Keeps arms flexed to chest more so with left extremity.  Does have some minor pain with flexion of left upper extremity.  Pillow splint ordered for bedtime use x6 hours.  No spasticity Skin: no rashes, lesions, ulcers. No induration Neurologic: CN 2-12 grossly intact.  Sensation intact. Left hemineglect and based on observation left side appears weaker than right. Psychiatric: Alert and oriented to name.  Difficult to ascertain other levels of orientation given inability to  consistently phonate with tracheostomy tube in place.   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 -Recent issues with acute encephalopathy and lack of response as well as left hemiparesis-as of 11/12 she is more awake and following commands and attempting to vocalize over trach -Noted with  possible early contracture left upper extremity-pillow splint ordered by OT-continue PT and OT -Continue SLP both for swallowing and speech and cognition  Acute ventilatory dependent respiratory failure with hypoxemia/Inability to clear secretions/requires tracheostomy tube/COPD -Respiratory status stable on 21% FiO2 -11/8 transitioned to cuffless trach -Speech therapy evaluation. MBS 11/2 done. Dysphagia 1 diet recommended but patient only tolerating small bites of food. -Use PMV with all oral intake. -Continue SLP as above -Trach team recommends trial of CPT and Yupelri (LABA) to aid with secretions-ready for capping trials at this juncture (will likely need preauthorization if Yupelri to continue after discharge)  Stage IV adenocarcinoma of the left lung with left pleural effusion  -CT of chest on admission showed persistent large left pleural effusion with near complete left lung collapse. Left apical mass measuring 3.5 x 3.3 cm suspicious for bronchogenic carcinoma -Pleural fluid pathology consistent with metastatic adenocarcinoma -poor performance status precludes chemotherapy with metastatic disease  -Patient will follow with oncology as an outpatient if improves clinically (was evaluated by Dr. Narda Rutherford during this hospitalization)  Dysphagia/ Nutrition Status: Nutrition Problem: Moderate Malnutrition Etiology: chronic illness (HIV) Signs/Symptoms: mild muscle depletion, moderate muscle depletion, mild fat depletion, moderate fat depletion Interventions: Ensure Enlive (each supplement provides 350kcal and 20 grams of protein), Tube feeding, MVI  Estimated body mass index is 26.17 kg/m as calculated from the following:   Height as of this encounter: 4\' 10"  (1.473 m).   Weight as of this encounter: 56.8 kg.  Acute diarrhea -Persistent diarrhea despite discontinuation of offending medications -11/10 Imodium initiated with decrease in diarrhea volume-suspect may be related to  tube feeding -11/15 add Bentyl  GOC  -Family had requested transfer to another facility when informed poor prognosis.  At this time no clinical reason to transfer to other facility.  Palliative care consult appreciated. -LTAC denied -Case management working to find SNF  Decubitus ulcer not POA Pressure Injury 02/16/20 Perineum Right Stage 2 -  Partial thickness loss of dermis presenting as a shallow open injury with a red, pink wound bed without slough. 2cm x 1cm pink (Active)  Date First Assessed/Time First Assessed: 02/16/20 1200   Location: Perineum  Location Orientation: Right  Staging: Stage 2 -  Partial thickness loss of dermis presenting as a shallow open injury with a red, pink wound bed without slough.  Wound Descri...    Assessments 02/16/2020 12:00 PM 03/08/2020  7:15 AM  Dressing Type Moisture barrier Foam - Lift dressing to assess site every shift  Dressing -- Dry;Intact  Site / Wound Assessment Clean;Pink --  Peri-wound Assessment Intact --  Wound Length (cm) 2 cm --  Wound Width (cm) 1 cm --  Wound Depth (cm) 0 cm --  Wound Surface Area (cm^2) 2 cm^2 --  Wound Volume (cm^3) 0 cm^3 --  Margins Unattached edges (unapproximated) --  Drainage Amount Scant --  Treatment Cleansed --     No Linked orders to display      Other problems: Hypertension/left ventricular diastolic dysfunction -Heart rate and blood pressure stable on Coreg. -No clinical signs consistent with heart failure  HIV/AIDS -Continue home medications including: atovaquone, dolutegravir and descovy;  discussing with pharmacy and ID about transitioning to more affordable option since we are pursuing SNF care and expensive medications may be barrier to placement  Type 2 diabetes mellitus -A1c 6.5 on 10/7. -Currently stable on 15 units Levemir twice daily with sliding scale insulin.  Chronic normocytic anemia -Hemoglobin stable at 9.0    Data Reviewed: Basic Metabolic Panel: Recent Labs  Lab  03/02/20 0313 03/08/20 0442  NA 135 132*  K 4.6 5.4*  CL 99 95*  CO2 27 27  GLUCOSE 126* 157*  BUN 21 22  CREATININE 1.08* 1.04*  CALCIUM 8.9 8.9   Liver Function Tests: Recent Labs  Lab 03/08/20 0442  AST 35  ALT 23  ALKPHOS 79  BILITOT 0.9  PROT 7.5  ALBUMIN 2.1*   No results for input(s): LIPASE, AMYLASE in the last 168 hours. No results for input(s): AMMONIA in the last 168 hours. CBC: Recent Labs  Lab 03/02/20 0313 03/08/20 0442  WBC 10.8* 11.2*  NEUTROABS 8.2*  --   HGB 9.0* 9.4*  HCT 29.2* 29.7*  MCV 93.6 92.2  PLT 510* 507*   Cardiac Enzymes: No results for input(s): CKTOTAL, CKMB, CKMBINDEX, TROPONINI in the last 168 hours. BNP (last 3 results) No results for input(s): BNP in the last 8760 hours.  ProBNP (last 3 results) Recent Labs    08/07/19 1422  PROBNP 110.0*    CBG: Recent Labs  Lab 03/07/20 1800 03/07/20 2006 03/07/20 2321 03/08/20 0419 03/08/20 0849  GLUCAP 100* 253* 238* 172* 212*    No results found for this or any previous visit (from the past 240 hour(s)).   Studies: No results found.  Scheduled Meds: . aspirin  81 mg Per Tube Daily  . atovaquone  1,500 mg Per Tube Q breakfast  . carvedilol  3.125 mg Per Tube BID WC  . chlorhexidine  15 mL Mouth Rinse BID  . Chlorhexidine Gluconate Cloth  6 each Topical Daily  . dolutegravir  50 mg Oral Daily  . emtricitabine-tenofovir AF  1 tablet Oral Daily  . enoxaparin (LOVENOX) injection  40 mg Subcutaneous Q24H  . feeding supplement  237 mL Oral BID BM  . feeding supplement (OSMOLITE 1.5 CAL)  780 mL Per Tube Q24H  . free water  200 mL Per Tube Q8H  . influenza vaccine adjuvanted  0.5 mL Intramuscular Tomorrow-1000  . insulin aspart  0-15 Units Subcutaneous Q4H  . insulin detemir  15 Units Subcutaneous BID  . loperamide HCl  2 mg Per Tube Q12H  . mouth rinse  15 mL Mouth Rinse q12n4p  . multivitamin  15 mL Per Tube Daily  . pantoprazole sodium  40 mg Per Tube QHS  .  revefenacin  175 mcg Nebulization Daily  . rosuvastatin  10 mg Per NG tube Daily   Continuous Infusions: . sodium chloride 250 mL (02/11/20 2212)  . sodium chloride Stopped (02/09/20 0556)    Active Problems:   DNR (do not resuscitate) discussion   Acute right MCA stroke ([place])   Middle cerebral artery embolism, right   Malnutrition of moderate degree   Adenocarcinoma of left lung ([place])   Adenocarcinoma ([place])   Palliative care by specialist   Infiltrate noted on imaging study   Pressure injury of skin   Endotracheal tube present   Pleural effusion on left   Acute respiratory failure with hypoxia ([place])   Tracheostomy in place ([place])   Ventilator dependent ([place])   Oropharyngeal dysphagia   Consultants:  Neurology  Interventional radiology  PCCM  Oncology  Palliative care  Procedures:  Echocardiogram  EEG  Core track placement  Tracheostomy tube  PEG tube  Antibiotics: Anti-infectives (From admission, onward)   Start     Dose/Rate Route Frequency Ordered Stop   03/05/20 1000  dolutegravir (TIVICAY) tablet 50 mg        50 mg Oral Daily 03/04/20 1008     03/05/20 1000  emtricitabine-tenofovir AF (DESCOVY) 200-25 MG per tablet 1 tablet        1 tablet Oral Daily 03/04/20 1008     03/04/20 0930  atovaquone (MEPRON) 750 MG/5ML suspension 1,500 mg        1,500 mg Per Tube Daily with breakfast 03/04/20 0833     03/04/20 0800  Darunavir-Cobicisctat-Emtricitabine-Tenofovir Alafenamide (SYMTUZA) 800-150-200-10 MG TABS 1 tablet  Status:  Discontinued        1 tablet Oral Daily with breakfast 03/03/20 1345 03/04/20 1008   02/12/20 1000  vancomycin (VANCOREADY) IVPB 1250 mg/250 mL        1,250 mg 166.7 mL/hr over 90 Minutes Intravenous Every 24 hours 02/11/20 1509 02/18/20 1438   02/07/20 1310  vancomycin (VANCOREADY) IVPB 750 mg/150 mL  Status:  Discontinued        750 mg 150 mL/hr over 60 Minutes Intravenous Every 12 hours 02/07/20 1310 02/11/20 1509   02/06/20 0000   vancomycin (VANCOREADY) IVPB 500 mg/100 mL  Status:  Discontinued        500 mg 100 mL/hr over 60 Minutes Intravenous Every 12 hours 02/05/20 1141 02/07/20 1310   02/05/20 1300  ceFEPIme (MAXIPIME) 2 g in sodium chloride 0.9 % 100 mL IVPB        2 g 200 mL/hr over 30 Minutes Intravenous Every 12 hours 02/05/20 1127 02/11/20 2140   02/05/20 1300  atovaquone (MEPRON) 750 MG/5ML suspension 1,500 mg  Status:  Discontinued        1,500 mg Per Tube Daily with breakfast 02/05/20 1137 03/03/20 1345   02/05/20 1230  vancomycin (VANCOCIN) IVPB 1000 mg/200 mL premix        1,000 mg 200 mL/hr over 60 Minutes Intravenous  Once 02/05/20 1127 02/05/20 1304   01/30/20 1000  dolutegravir (TIVICAY) tablet 50 mg  Status:  Discontinued        50 mg Oral Daily 01/30/20 0941 03/03/20 1345   01/30/20 1000  emtricitabine-tenofovir AF (DESCOVY) 200-25 MG per tablet 1 tablet  Status:  Discontinued        1 tablet Per Tube Daily 01/30/20 0941 03/03/20 1345   01/29/20 1600  emtricitabine-tenofovir AF (DESCOVY) 200-25 MG per tablet 1 tablet  Status:  Discontinued        1 tablet Per Tube Daily 01/29/20 1437 01/29/20 1507   01/29/20 1600  dolutegravir (TIVICAY) tablet 50 mg  Status:  Discontinued        50 mg Oral Daily 01/29/20 1437 01/29/20 1507   01/29/20 1415  bictegravir-emtricitabine-tenofovir AF (BIKTARVY) 50-200-25 MG per tablet 1 tablet  Status:  Discontinued        1 tablet Oral Daily 01/29/20 1408 01/29/20 1437   01/29/20 1045  amoxicillin-clavulanate (AUGMENTIN) 875-125 MG per tablet 1 tablet  Status:  Discontinued        1 tablet Oral 2 times daily 01/29/20 1041 01/29/20 1510   01/29/20 1045  bictegravir-emtricitabine-tenofovir AF (BIKTARVY) 50-200-25 MG per tablet 1 tablet  Status:  Discontinued        1 tablet Oral Daily 01/29/20 1041 01/29/20  1042   01/29/20 0913  ceFAZolin (ANCEF) 2-4 GM/100ML-% IVPB       Note to Pharmacy: Tressie Stalker   : cabinet override      01/29/20 0913 01/29/20 2114        Time spent: 20 minutes    Erin Hearing ANP  [facility] Pager [phone]. If 7PM-7AM, please contact night-coverage at [email or web address] 03/08/2020, 10:28 AM  LOS: 39 days

## 2020-03-08 NOTE — Progress Notes (Signed)
  Speech Language Pathology Treatment: Dysphagia  Patient Details Name: Amber Lopez MRN: [id number] DOB: 04/22/1941 Today's Date: 03/08/2020 Time: 8182-9937 SLP Time Calculation (min) (ACUTE ONLY): 27 min  Assessment / Plan / Recommendation Clinical Impression  Assisted Ms. Pieczynski with lunch tray.  Repositioned to optimize safety and participation. PMV was in place upon entering room and pt tolerating well with strong voice. Left inattention persists.  With mitt removed RUE, pt needed hand-over-hand total assist to self-feed with spoon.  She was able to bring edge of cup to lips with physical assist; she could not use a straw, chewing on it rather than drawing from it. With cup sips, there were no s/s of aspiration, c/w MBS findings. Dysphagia is primarily impacted by her cognition - she required verbal cues to attend to approaching spoon/puree and to persist through oral preparation.  Attention and effort improved as swallowing repetitions of puree increased.  She is not ready to advance solids - will continue to follow.   Continue dys 1, thins; PMV when eating and during waking hours.    HPI HPI: Patient is a 79 y.o. female with PMH: HIV, depression, DM, HTN, HLD, arthritis, breast cancer, who was admitted on 10/7, who presented with acute onset of left hemiplegia, left facial droop, right eye deviation and aphasia. MR Brain revealed multifocal infarct in the right MCA territory affecting cortex and striatum, mild petechial hemorrhage as well as small acute left cerebellar infarct and generalized brain atrophy. She was intubated from 10/7 through 10/20 when trach was placed. PEG placed on 10/29.  Most recent CXR on 10/28 revealed persistent left base collapse/consolidation with small left pleural effusion. Patient has a Therapist, sports, changed to cuffless on 11/8, is tolerating trach collar at 8 Liters, 35 FiO2.      SLP Plan  Continue with current plan of care       Recommendations   Diet recommendations: Dysphagia 1 (puree);Thin liquid Liquids provided via: Cup;Straw Medication Administration: Via alternative means Supervision: Full supervision/cueing for compensatory strategies;Staff to assist with self feeding Compensations: Minimize environmental distractions Postural Changes and/or Swallow Maneuvers: Seated upright 90 degrees      Patient may use Passy-Muir Speech Valve: During all waking hours (remove during sleep) PMSV Supervision: Intermittent         Oral Care Recommendations: Oral care BID Follow up Recommendations: Skilled Nursing facility SLP Visit Diagnosis: Dysphagia, unspecified (R13.10) Plan: Continue with current plan of care       GO                Amber Lopez 03/08/2020, 1:39 PM  Brady Schiller L. Tivis Ringer, [place] Office number [phone] Pager [phone]

## 2020-03-08 NOTE — Progress Notes (Signed)
NAME:  Amber Lopez, MRN:  [id number], DOB:  1940/05/04, LOS: 14 ADMISSION DATE:  01/29/2020, CONSULTATION DATE:  10/7 REFERRING MD:  Estanislado Pandy, CHIEF COMPLAINT:  Left Sided Weakness   Brief History   79 y/o F, with HIV, admitted 10/7 with LVO of R M1 s/p tPA, neuro IR revascularization.  Returned to ICU post procedure on mechanical ventilation. Additional finding of left superior sulcus mass and complex effusion.   Past Medical History  HIV/AIDS, HTN, HLD, DM, Depression , Arthritis, Breast Cancer   Significant Hospital Events   10/07 Admit with L hemiplegia, facial droop, aphasia with right eye deviation 10/11 Episode of worsened R eye deviation with tachycardia and HTN, concern for sz  10/20 trach placed 10/28 Failed PSV due to tachypnea.   10/29 PEG placed 10/30-10/31: placed on atc tolerated well.   Consults:  Neurology, IR  Oncology 10/13 Palliative care 10/13  Procedures:  ETT 10/7 >> 10/20 L Radial ALine 10/7 >>  out R Femoral Sheath 10/7 >> out Trach 10/20 >>  Significant Diagnostic Tests:  10/7 CT Head Code Stroke >> no acute finding, generalized atrophy  10/7 CTA Head/Neck >> emergent LVO at the right M1 segment, no visible embolic source, atherosclerosis without flow limiting stenosis or ulceration of major vessels, left superior sulcus mass with large complex pleural effusion, associated mediastinal adenopathy  10/7 CT ABD w/contrast >> Negative for hematoma 10/7 CT Chest w/contrast >> Persistent large left pleural effusion with near complete left lung collapse. Left apical mass measuring 3.5 x 3.3 cm suspicious for bronchogenic carcinoma 10/11 CT head >> no acute findings 10/11 EEG >> Continuous slow, generalized and maximal right frontotemporal region  Micro Data:  COVID 10/7 >> negative  Influenza A/B 10/7 >> negative Pleural fluid culture>>no growth three days 10/9 BCx2>> 1/2 with gram positives 10/7 MRSA screen>>negative 10/11 BC x 1 (from left PICC) >  staph hominis 1/2 10/14 trach asp >> few WBC, rare G+ cocci>> few stenotrophomonas maltophilia S to levaquin and bactrim  10/14 BC >> negative  Antimicrobials:  Cefazolin 10/7 x1 Dolutegravir/ emtricitabine/ tenofovir 10/8 >> Vanc 10/14 >> 10/27 Cefepime 10/14 > 10/20  Interim history/subjective:   No new issues reported.  She is on ATC, has a PMV in place and is using this while awake Able to interact but still appears confused  Objective   Blood pressure (!) 112/55, pulse 90, temperature 97.7 F (36.5 C), temperature source Axillary, resp. rate 18, height 4\' 10"  (1.473 m), weight 56.8 kg, SpO2 100 %.    FiO2 (%):  [21 %] 21 %  No intake or output data in the 24 hours ending 03/08/20 1105 Filed Weights   03/03/20 0331 03/05/20 0500 03/07/20 0500  Weight: 58.6 kg 56 kg 56.8 kg    Examination: Constitutional: Chronically ill woman, no distress  neck: Trach in place, no issues with stoma, clean and dry.  PMV in place.  She is able to phonate  Cardiovascular: Regular, trace upper extremity and lower extremity edema Respiratory: Scattered bilateral rhonchi, comfortable respiratory pattern Gastrointestinal: Soft, nondistended, positive bowel sounds Skin: No rash Neurologic: Left side is weak, able to answer questions and phonate.  She would not cough on command but this seemed to be volitional.  She also would not let me suction out her mouth.  Confused regarding current status, situation   Resolved Hospital Problem list   Staph Hominis Bacteremia   Assessment & Plan:    Acute embolic CVA in the right MCA and  left cerebellum tracheostomy dependence due to Inability to clear secretions & ineffective cough  Adenocarcinoma of the left lung with left pleural effusion  Hypertension HIV/AIDS DM II  At Risk Malnutrition PEG placed 10/29 Normocytic Anemia  Continue her current cuffless [place] with standard trach care. Okay to continue ATC ad lib. Suction if needed although it  appears she is able to clear her secretions to her oropharynx.  She would not let me suction out her mouth today but it seemed to be volitional PMV ad lib. as per SLP evaluation and recommendations Agree with continued palliative If she continues to do well with PMV in place then could consider formal capping trials, consider decannulation.  Baltazar Apo, MD, PhD 03/08/2020, 11:09 AM [facility] [phone] or if no answer [phone]

## 2020-03-09 DIAGNOSIS — C3492 Malignant neoplasm of unspecified part of left bronchus or lung: Secondary | ICD-10-CM | POA: Diagnosis not present

## 2020-03-09 DIAGNOSIS — Z930 Tracheostomy status: Secondary | ICD-10-CM | POA: Diagnosis not present

## 2020-03-09 DIAGNOSIS — I63511 Cerebral infarction due to unspecified occlusion or stenosis of right middle cerebral artery: Secondary | ICD-10-CM | POA: Diagnosis not present

## 2020-03-09 LAB — BASIC METABOLIC PANEL
Anion gap: 11 (ref 5–15)
BUN: 23 mg/dL (ref 8–23)
CO2: 27 mmol/L (ref 22–32)
Calcium: 9 mg/dL (ref 8.9–10.3)
Chloride: 95 mmol/L — ABNORMAL LOW (ref 98–111)
Creatinine, Ser: 1 mg/dL (ref 0.44–1.00)
GFR, Estimated: 57 mL/min — ABNORMAL LOW (ref >60)
Glucose, Bld: 212 mg/dL — ABNORMAL HIGH (ref 70–99)
Potassium: 5.1 mmol/L (ref 3.5–5.1)
Sodium: 133 mmol/L — ABNORMAL LOW (ref 135–145)

## 2020-03-09 LAB — GLUCOSE, CAPILLARY
Glucose-Capillary: 176 mg/dL — ABNORMAL HIGH (ref 70–99)
Glucose-Capillary: 201 mg/dL — ABNORMAL HIGH (ref 70–99)
Glucose-Capillary: 195 mg/dL — ABNORMAL HIGH (ref 70–99)
Glucose-Capillary: 112 mg/dL — ABNORMAL HIGH (ref 70–99)
Glucose-Capillary: 155 mg/dL — ABNORMAL HIGH (ref 70–99)
Glucose-Capillary: 133 mg/dL — ABNORMAL HIGH (ref 70–99)

## 2020-03-09 MED ORDER — DICYCLOMINE HCL 10 MG/5ML PO SOLN
10.0000 mg | Freq: Three times a day (TID) | ORAL | Status: DC
  Administered 2020-03-09 – 2020-03-22 (×53): 10 mg
  Filled 2020-03-09 (×57): qty 5

## 2020-03-09 MED ORDER — LOPERAMIDE HCL 1 MG/7.5ML PO SUSP
2.0000 mg | Freq: Every day | ORAL | Status: DC
  Administered 2020-03-10: 2 mg
  Filled 2020-03-09: qty 15

## 2020-03-09 MED ORDER — OSMOLITE 1.2 CAL PO LIQD
1000.0000 mL | ORAL | Status: DC
  Administered 2020-03-09 – 2020-03-16 (×6): 1000 mL
  Filled 2020-03-09 (×8): qty 1000

## 2020-03-09 NOTE — TOC Progression Note (Signed)
Transition of Care [facility]) - Progression Note    Patient Details  Name: REEANNA ACRI MRN: [id number] Date of Birth: 17-Jun-1940  Transition of Care [facility]) CM/SW [place], [place] Phone Number: 03/09/2020, 4:50 PM  Clinical Narrative:   CSW checked in with [facility] today about referral, still no response from Administration. CSW to follow.    Expected Discharge Plan: [place] Barriers to Discharge: Continued Medical Work up, SNF Pending bed offer  Expected Discharge Plan and Services Expected Discharge Plan: [place] In-house Referral: Clinical Social Work Discharge Planning Services: CM Consult Post Acute Care Choice: [place] arrangements for the past 2 months: Single Family Home                                       Social Determinants of Health (SDOH) Interventions    Readmission Risk Interventions No flowsheet data found.

## 2020-03-09 NOTE — Progress Notes (Addendum)
[facility] PROGRESS NOTE  Amber Lopez VOZ:[id number] DOB: 1940/11/03 DOA: 01/29/2020 PCP: Billie Ruddy, MD     11/16   Status: Remains inpatient appropriate because:Altered mental status, Unsafe d/c plan and Trach placed this admission on 10/20 and needs to be in place 30 days before can be considered for SNF placement   Dispo: The patient is from: Home              Anticipated d/c is to: SNF              Anticipated d/c date is: > 3 days              Patient currently is medically stable to d/c.   Code Status: DNR Family Communication: 11/15 spoke with daughter [name] DVT prophylaxis: Lovenox Vaccination status: Patient completed both doses of Pfizer Covid vaccine on 09/01/2019  Foley catheter: No  HPI: 79 year old female with past medical history for HIV/AIDS, hypertension, dyslipidemia, diabetes, depression, arthritis and breast cancer.  Patient presented to the ER on 10/7 with strokelike symptoms consisting of left hemiplegia, facial droop, aphasia.  CTA head neck showed emergent LVO at the right M1 segment with patient subsequently undergoing TPA and revascularization by neurology and IR.  She was admitted to the ICU postprocedure on mechanical ventilation.  Unfortunately patient's mentation and neurological status remained poor.  She was not able to follow commands and she was unable to wean from the ventilator.  Tracheostomy tube was placed on 10/20 and a PEG tube was placed on 10/29.  Unfortunately her neurological status has not improved.  Other complications since admission include a large left pleural effusion/left apical mass with pathology assistant with metastatic adenocarcinoma.  She has been denied for LTAC placement.  Palliative care has been discussing with patient family her poor prognosis and initially family requested patient be transferred to another facility but no clinical reason to transfer therefore patient remains facility.  Subjective: Awake and  alert.  Confirmed that she had excessive secretions in her mouth.  Attempted to get her to spit them out onto a tissue but she did not.  Attempted to perform oral suction but patient would not allow.  Nursing staff made aware.  Objective: Vitals:   03/09/20 0743 03/09/20 0806  BP: 114/76   Pulse:  85  Resp: 20 (!) 22  Temp: 98.7 F (37.1 C)   SpO2: 100% 96%    Intake/Output Summary (Last 24 hours) at 03/09/2020 1146 Last data filed at 03/09/2020 0900 Gross per 24 hour  Intake 2003.25 ml  Output 1900 ml  Net 103.25 ml   Filed Weights   03/03/20 0331 03/05/20 0500 03/07/20 0500  Weight: 58.6 kg 56 kg 56.8 kg    Exam:  Constitutional: NAD, calm, comfortable Respiratory: clear to auscultation bilaterally -no obvious excessive secretions from trach.  Normal respiratory effort. #6 cuffless trach  Cardiovascular: Regular rate and rhythm, No lower extremity edema.  Adequate capillary refill Abdomen: None tender, Bowel sounds positive. G-tube in place with tube feeding infusing.  Flexi-Seal in place draining liquid brown stool to bedside bag Musculoskeletal: No joint deformity upper and lower extremities.  Keeps arms flexed to chest more so with left extremity.LUE Pillow splint ordered for bedtime use x6 hours.  No spasticity Neurologic: CN 2-12 grossly intact.  Sensation intact. Left hemineglect - left side weaker than right. Psychiatric: Alert and oriented to name.  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 -Recent issues with acute encephalopathy and lack of response as well as left hemiparesis-as of 11/12 she is more awake and following commands and attempting to vocalize over trach -Noted with possible early contracture left upper extremity-pillow splint ordered by OT-continue PT and OT  -Continue SLP (swallowing and speech/cognition)  Acute ventilatory dependent respiratory failure with hypoxemia/Inability to clear secretions/requires tracheostomy tube/COPD -Respiratory status stable on 21% FiO2 and has been transitioned to cuffless trach -MBS: Dysphagia 1 diet recommended but patient only tolerating small bites of food. -Use PMV with all oral intake under direction of SLP -Trach team recommends trial of CPT and Yupelri (LABA) to aid with secretions-ready for capping trials at this juncture (will likely need preauthorization if Yupelri to continue after discharge)  Stage IV adenocarcinoma of the left lung with left pleural effusion  -CT of chest at time of admission w/ persistent large left pleural effusion with near complete left lung collapse. Left apical mass measuring 3.5 x 3.3 cm suspicious for bronchogenic carcinoma -Pleural fluid pathology consistent with metastatic adenocarcinoma -poor performance status precludes chemotherapy with metastatic disease; 11/15 reinforced this with patient's daughter by telephone -Patient will follow with oncology as an outpatient if improves clinically (was evaluated by Dr. Narda Rutherford during this hospitalization)  Dysphagia/ Nutrition Status: Nutrition Problem: Moderate Malnutrition Etiology: chronic illness (HIV) Signs/Symptoms: mild muscle depletion, moderate muscle depletion, mild fat depletion, moderate fat depletion Interventions: Ensure Enlive (each supplement provides 350kcal and 20 grams of protein), Tube feeding, MVI  Estimated body mass index is 26.17 kg/m as calculated from the following:   Height as of this encounter: 4\' 10"  (1.473 m).   Weight as of this encounter: 56.8 kg.  Acute diarrhea -Improving -Add persistent diarrhea despite discontinuation of offending medications but with initiation of Imodium and Bentyl bowel movements have decreased -We will change Imodium to once daily and if no further reemergence of  diarrhea can change to as needed l  GOC  -Family had requested transfer to another facility when informed poor prognosis.  At this time no clinical reason to transfer to other facility.  Palliative care consult appreciated. -LTAC denied -Case management working to find SNF  Decubitus ulcer not POA Pressure Injury 02/16/20 Perineum Right Stage 2 -  Partial thickness loss of dermis presenting as a shallow open injury with a red, pink wound bed without slough. 2cm x 1cm pink (Active)  Date First Assessed/Time First Assessed: 02/16/20 1200   Location: Perineum  Location Orientation: Right  Staging: Stage 2 -  Partial thickness loss of dermis presenting as a shallow open injury with a red, pink wound bed without slough.  Wound Descri...    Assessments 02/16/2020 12:00 PM 03/09/2020  7:15 AM  Dressing Type Moisture barrier Foam - Lift dressing to assess site every shift  Dressing - Dry;Intact  State of Healing - Epithelialized  Site / Wound Assessment Clean;Pink Pink;Red  Peri-wound Assessment Intact -  Wound Length (cm) 2 cm -  Wound Width (cm) 1 cm -  Wound Depth (cm) 0 cm -  Wound Surface Area (cm^2) 2 cm^2 -  Wound Volume (cm^3) 0 cm^3 -  Margins Unattached edges (unapproximated) -  Drainage Amount Scant -  Treatment Cleansed -     No Linked orders to display      Other problems: Hypertension/left ventricular diastolic dysfunction -Heart rate and blood pressure stable on Coreg. -No clinical signs consistent  with heart failure  HIV/AIDS -Continue home medications including: atovaquone, dolutegravir and descovy; discussing with pharmacy and ID about transitioning to more affordable option since we are pursuing SNF care and expensive medications may be barrier to placement  Type 2 diabetes mellitus -A1c 6.5 on 10/7. -Currently stable on 15 units Levemir twice daily with sliding scale insulin.  Chronic normocytic anemia -Hemoglobin stable at 9.0    Data Reviewed: Basic  Metabolic Panel: Recent Labs  Lab 03/08/20 0442 03/09/20 0818  NA 132* 133*  K 5.4* 5.1  CL 95* 95*  CO2 27 27  GLUCOSE 157* 212*  BUN 22 23  CREATININE 1.04* 1.00  CALCIUM 8.9 9.0   Liver Function Tests: Recent Labs  Lab 03/08/20 0442  AST 35  ALT 23  ALKPHOS 79  BILITOT 0.9  PROT 7.5  ALBUMIN 2.1*   No results for input(s): LIPASE, AMYLASE in the last 168 hours. No results for input(s): AMMONIA in the last 168 hours. CBC: Recent Labs  Lab 03/08/20 0442  WBC 11.2*  HGB 9.4*  HCT 29.7*  MCV 92.2  PLT 507*   Cardiac Enzymes: No results for input(s): CKTOTAL, CKMB, CKMBINDEX, TROPONINI in the last 168 hours. BNP (last 3 results) No results for input(s): BNP in the last 8760 hours.  ProBNP (last 3 results) Recent Labs    08/07/19 1422  PROBNP 110.0*    CBG: Recent Labs  Lab 03/08/20 1938 03/08/20 2350 03/09/20 0453 03/09/20 0749 03/09/20 1137  GLUCAP 101* 222* 176* 201* 195*    No results found for this or any previous visit (from the past 240 hour(s)).   Studies: No results found.  Scheduled Meds: . aspirin  81 mg Per Tube Daily  . atovaquone  1,500 mg Per Tube Q breakfast  . carvedilol  3.125 mg Per Tube BID WC  . chlorhexidine  15 mL Mouth Rinse BID  . Chlorhexidine Gluconate Cloth  6 each Topical Daily  . dicyclomine  10 mg Per Tube TID AC & HS  . dolutegravir  50 mg Oral Daily  . emtricitabine-tenofovir AF  1 tablet Oral Daily  . enoxaparin (LOVENOX) injection  40 mg Subcutaneous Q24H  . feeding supplement  237 mL Oral BID BM  . feeding supplement (OSMOLITE 1.5 CAL)  780 mL Per Tube Q24H  . free water  200 mL Per Tube Q8H  . influenza vaccine adjuvanted  0.5 mL Intramuscular Tomorrow-1000  . insulin aspart  0-15 Units Subcutaneous Q4H  . insulin detemir  15 Units Subcutaneous BID  . loperamide HCl  2 mg Per Tube Q12H  . mouth rinse  15 mL Mouth Rinse q12n4p  . multivitamin  15 mL Per Tube Daily  . pantoprazole sodium  40 mg Per  Tube QHS  . revefenacin  175 mcg Nebulization Daily  . rosuvastatin  10 mg Per NG tube Daily   Continuous Infusions: . sodium chloride 250 mL (02/11/20 2212)  . sodium chloride Stopped (02/09/20 0556)    Active Problems:   DNR (do not resuscitate) discussion   Acute right MCA stroke ([place])   Middle cerebral artery embolism, right   Malnutrition of moderate degree   Adenocarcinoma of left lung ([place])   Adenocarcinoma ([place])   Palliative care by specialist   Infiltrate noted on imaging study   Pressure injury of skin   Endotracheal tube present   Pleural effusion on left   Acute respiratory failure with hypoxia ([place])   Tracheostomy in place [facility])   Ventilator  dependent [facility])   Oropharyngeal dysphagia   Consultants:  Neurology  Interventional radiology  PCCM  Oncology  Palliative care  Procedures:  Echocardiogram  EEG  Core track placement  Tracheostomy tube  PEG tube  Antibiotics: Anti-infectives (From admission, onward)   Start     Dose/Rate Route Frequency Ordered Stop   03/05/20 1000  dolutegravir (TIVICAY) tablet 50 mg        50 mg Oral Daily 03/04/20 1008     03/05/20 1000  emtricitabine-tenofovir AF (DESCOVY) 200-25 MG per tablet 1 tablet        1 tablet Oral Daily 03/04/20 1008     03/04/20 0930  atovaquone (MEPRON) 750 MG/5ML suspension 1,500 mg        1,500 mg Per Tube Daily with breakfast 03/04/20 0833     03/04/20 0800  Darunavir-Cobicisctat-Emtricitabine-Tenofovir Alafenamide (SYMTUZA) 800-150-200-10 MG TABS 1 tablet  Status:  Discontinued        1 tablet Oral Daily with breakfast 03/03/20 1345 03/04/20 1008   02/12/20 1000  vancomycin (VANCOREADY) IVPB 1250 mg/250 mL        1,250 mg 166.7 mL/hr over 90 Minutes Intravenous Every 24 hours 02/11/20 1509 02/18/20 1438   02/07/20 1310  vancomycin (VANCOREADY) IVPB 750 mg/150 mL  Status:  Discontinued        750 mg 150 mL/hr over 60 Minutes Intravenous Every 12 hours 02/07/20 1310 02/11/20 1509    02/06/20 0000  vancomycin (VANCOREADY) IVPB 500 mg/100 mL  Status:  Discontinued        500 mg 100 mL/hr over 60 Minutes Intravenous Every 12 hours 02/05/20 1141 02/07/20 1310   02/05/20 1300  ceFEPIme (MAXIPIME) 2 g in sodium chloride 0.9 % 100 mL IVPB        2 g 200 mL/hr over 30 Minutes Intravenous Every 12 hours 02/05/20 1127 02/11/20 2140   02/05/20 1300  atovaquone (MEPRON) 750 MG/5ML suspension 1,500 mg  Status:  Discontinued        1,500 mg Per Tube Daily with breakfast 02/05/20 1137 03/03/20 1345   02/05/20 1230  vancomycin (VANCOCIN) IVPB 1000 mg/200 mL premix        1,000 mg 200 mL/hr over 60 Minutes Intravenous  Once 02/05/20 1127 02/05/20 1304   01/30/20 1000  dolutegravir (TIVICAY) tablet 50 mg  Status:  Discontinued        50 mg Oral Daily 01/30/20 0941 03/03/20 1345   01/30/20 1000  emtricitabine-tenofovir AF (DESCOVY) 200-25 MG per tablet 1 tablet  Status:  Discontinued        1 tablet Per Tube Daily 01/30/20 0941 03/03/20 1345   01/29/20 1600  emtricitabine-tenofovir AF (DESCOVY) 200-25 MG per tablet 1 tablet  Status:  Discontinued        1 tablet Per Tube Daily 01/29/20 1437 01/29/20 1507   01/29/20 1600  dolutegravir (TIVICAY) tablet 50 mg  Status:  Discontinued        50 mg Oral Daily 01/29/20 1437 01/29/20 1507   01/29/20 1415  bictegravir-emtricitabine-tenofovir AF (BIKTARVY) 50-200-25 MG per tablet 1 tablet  Status:  Discontinued        1 tablet Oral Daily 01/29/20 1408 01/29/20 1437   01/29/20 1045  amoxicillin-clavulanate (AUGMENTIN) 875-125 MG per tablet 1 tablet  Status:  Discontinued        1 tablet Oral 2 times daily 01/29/20 1041 01/29/20 1510   01/29/20 1045  bictegravir-emtricitabine-tenofovir AF (BIKTARVY) 50-200-25 MG per tablet 1 tablet  Status:  Discontinued  1 tablet Oral Daily 01/29/20 1041 01/29/20 1042   01/29/20 0913  ceFAZolin (ANCEF) 2-4 GM/100ML-% IVPB       Note to Pharmacy: Tressie Stalker   : cabinet override      01/29/20 0913 01/29/20  2114       Time spent: 20 minutes    Erin Hearing ANP  [facility] Pager [phone]. If 7PM-7AM, please contact night-coverage at [email or web address] 03/09/2020, 11:46 AM  LOS: 40 days

## 2020-03-09 NOTE — Progress Notes (Signed)
Physical Therapy Treatment Patient Details Name: Amber Lopez MRN: [id number] DOB: 1940-06-30 Today's Date: 03/09/2020    History of Present Illness 79 y.o. female with a PMHx of depression, HLD, HIV, HTN and DM presenting with acute onset of left hemiplegia, left facial droop, right eye deviation and aphasia. Pt received IV tPA and underwent R MCA revacularization on 01/29/2020. Pt also found to have large left pleural effusion with near complete L lung collapse and L apical mass. Chest tube placed on 01/31/2020. s/p trach on 02/11/20.     PT Comments    Pt awake alert throughout session today and nodded approval to wanting to get OOB. Worked on sitting balance EOB with pt progressing from mod A needed to min A until she fatigued and returned to mod A. Scoot transfer bed to chair performed with max A +2 with pt providing minimal trunk control. Pt followed 25% instructional commands. PT will continue to follow. \   Follow Up Recommendations  SNF;Supervision/Assistance - 24 hour     Equipment Recommendations  Wheelchair (measurements PT);Wheelchair cushion (measurements PT);Hospital bed;3in1 (PT);Other (comment) (mechanical lift)    Recommendations for Other Services       Precautions / Restrictions Precautions Precautions: Fall Precaution Comments: trach, PEG Required Braces or Orthoses: Splint/Cast (soft spint for night wear) Splint/Cast: soft elbow splint Restrictions Weight Bearing Restrictions: No    Mobility  Bed Mobility Overal bed mobility: Needs Assistance Bed Mobility: Supine to Sit Rolling: Total assist Sidelying to sit: Total assist;+2 for physical assistance Supine to sit: Max assist;+2 for physical assistance Sit to supine: Total assist;HOB elevated   General bed mobility comments: pt with some trunk activation with SL to sit. Dependent for LE's off bed. Max A for elevation of trunk into sitting  Transfers Overall transfer level: Needs assistance    Transfers: Lateral/Scoot Transfers          Lateral/Scoot Transfers: +2 physical assistance;Total assist General transfer comment: pt maintained partial trunk control during scoot to drop arm recliner. +2 tot A at hips with pad  Ambulation/Gait             General Gait Details: unable   Stairs             Wheelchair Mobility    Modified Rankin (Stroke Patients Only) Modified Rankin (Stroke Patients Only) Pre-Morbid Rankin Score: Slight disability Modified Rankin: Severe disability     Balance Overall balance assessment: Needs assistance Sitting-balance support: Bilateral upper extremity supported;Feet unsupported Sitting balance-Leahy Scale: Poor Sitting balance - Comments: worked on sitting balance. Began with mod A, progressed to min A to pillow under R hip Postural control: Left lateral lean Standing balance support: Bilateral upper extremity supported Standing balance-Leahy Scale: Zero                              Cognition Arousal/Alertness: Awake/alert Behavior During Therapy: Flat affect Overall Cognitive Status: Impaired/Different from baseline Area of Impairment: Attention;Following commands;Safety/judgement                   Current Attention Level: Focused Memory: Decreased short-term memory Following Commands: Follows one step commands with increased time;Follows one step commands inconsistently Safety/Judgement: Decreased awareness of safety;Decreased awareness of deficits Awareness: Intellectual Problem Solving: Slow processing;Decreased initiation;Difficulty sequencing;Requires tactile cues;Requires verbal cues General Comments: pt remained awake throughout session, has difficulty attending when she fatigues      Exercises Other Exercises Other Exercises: PROM LE hip  and knee flex/ ext bilaterally before moving to EOB. 5x each LE    General Comments General comments (skin integrity, edema, etc.): worked on crossing  midline to L with gaze, she can maintain 1-2 seconds. Worked on manually facilitated reaching activities in sitting for trunk activation.       Pertinent Vitals/Pain Pain Assessment: Faces Pain Score: 1  Faces Pain Scale: No hurt Pain Location: LEft elbow extension  Pain Descriptors / Indicators: Grimacing Pain Intervention(s): Limited activity within patient's tolerance;Monitored during session    Home Living                      Prior Function            PT Goals (current goals can now be found in the care plan section) Acute Rehab PT Goals Patient Stated Goal: did not state PT Goal Formulation: With patient Time For Goal Achievement: 03/11/20 Potential to Achieve Goals: Poor Progress towards PT goals: Progressing toward goals    Frequency    Min 1X/week      PT Plan Current plan remains appropriate    Co-evaluation              AM-PAC PT "6 Clicks" Mobility   Outcome Measure  Help needed turning from your back to your side while in a flat bed without using bedrails?: A Lot Help needed moving from lying on your back to sitting on the side of a flat bed without using bedrails?: A Lot Help needed moving to and from a bed to a chair (including a wheelchair)?: Total Help needed standing up from a chair using your arms (e.g., wheelchair or bedside chair)?: A Lot Help needed to walk in hospital room?: Total Help needed climbing 3-5 steps with a railing? : Total 6 Click Score: 9    End of Session   Activity Tolerance: Patient limited by fatigue Patient left: with call bell/phone within reach;in chair;with chair alarm set Nurse Communication: Mobility status;Need for lift equipment PT Visit Diagnosis: Other abnormalities of gait and mobility (R26.89);Muscle weakness (generalized) (M62.81);Hemiplegia and hemiparesis;Other symptoms and signs involving the nervous system (R29.898);Unsteadiness on feet (R26.81) Hemiplegia - Right/Left: Left Hemiplegia -  dominant/non-dominant: Non-dominant Hemiplegia - caused by: Cerebral infarction     Time: 1205-1230 PT Time Calculation (min) (ACUTE ONLY): 25 min  Charges:  $Therapeutic Exercise: 8-22 mins $Therapeutic Activity: 8-22 mins                     [place]at [place]  Pager [phone] Office [place] 03/09/2020, 3:38 PM

## 2020-03-09 NOTE — Progress Notes (Deleted)
Patient left during shift change.  Oncoming nurse, myself, never got to assess patient, transfer occurred with day shift RN.

## 2020-03-09 NOTE — Progress Notes (Signed)
Orthopedic Tech Progress Note Patient Details:  Amber Lopez 10-31-1940 [id number] Patient has pillow splint from OT Patient ID: Amber Lopez, female   DOB: 1940-08-07, 79 y.o.   MRN: [id number]   Amber Lopez 03/09/2020, 10:54 PM

## 2020-03-09 NOTE — Progress Notes (Signed)
Occupational Therapy Treatment Patient Details Name: Amber Lopez MRN: [id number] DOB: 10-Sep-1940 Today's Date: 03/09/2020    History of present illness 79 y.o. female with a PMHx of depression, HLD, HIV, HTN and DM presenting with acute onset of left hemiplegia, left facial droop, right eye deviation and aphasia. Pt received IV tPA and underwent R MCA revacularization on 01/29/2020. Pt also found to have large left pleural effusion with near complete L lung collapse and L apical mass. Chest tube placed on 01/31/2020. s/p trach on 02/11/20.    OT comments  Pt making slow progress towards goals, improved LUE function wrist/hand during grooming tasks seated EOB. Continues to require +2 max-total for transfers, with fatigue limiting tolerated for static seated ADL's. Session with focus on static sitting at EOB unsupported for engagement in ADL's and environment. Nursing made aware of skin break down and positioning in bed for decreased pressure to buttocks. Recommendations continue to be appropriate and OT will follow acutely.    Follow Up Recommendations  SNF    Equipment Recommendations  Hospital bed;Wheelchair cushion (measurements OT);Wheelchair (measurements OT);Other (comment) (air mattress)    Recommendations for Other Services      Precautions / Restrictions Precautions Precautions: Fall Precaution Comments: trach, PEG Required Braces or Orthoses: Splint/Cast (soft spint for night wear) Splint/Cast: soft elbow splint Restrictions Weight Bearing Restrictions: No       Mobility Bed Mobility Overal bed mobility: Needs Assistance Bed Mobility: Rolling Rolling: Total assist Sidelying to sit: Total assist;+2 for physical assistance Supine to sit: Total assist (HOB flat) Sit to supine: Total assist;HOB elevated      Transfers Overall transfer level: Needs assistance   Transfers: Lateral/Scoot Transfers (to [facility])          Lateral/Scoot Transfers: +2 physical  assistance;Total assist General transfer comment: need for max-total A of 2 for safety due to onset of fatigue with sustained static sitting EOB    Balance Overall balance assessment: Needs assistance Sitting-balance support: Bilateral upper extremity supported;Feet unsupported Sitting balance-Leahy Scale: Poor Sitting balance - Comments: initially Min A increasing to need for mod A with noted R trunk rotation and L hip tilt. worsened with onset of fatigue                                    ADL either performed or assessed with clinical judgement   ADL       Grooming: Wash/dry face;Oral care;Maximal assistance;Sitting Grooming Details (indicate cue type and reason): initiated hand to mouth but difficulty with full execution of task                       Toileting- Clothing Manipulation and Hygiene: Total assistance;Bed level       Functional mobility during ADLs: +2 for physical assistance;Total assistance General ADL Comments: positioned in bed total (A) with bed tilted     Vision       Perception     Praxis      Cognition Arousal/Alertness: Awake/alert Behavior During Therapy: Flat affect Overall Cognitive Status: Impaired/Different from baseline Area of Impairment: Attention;Following commands;Safety/judgement                   Current Attention Level: Focused Memory: Decreased short-term memory Following Commands: Follows one step commands with increased time;Follows one step commands inconsistently Safety/Judgement: Decreased awareness of safety;Decreased awareness of deficits Awareness: Intellectual Problem Solving: Slow processing;Decreased  initiation;Difficulty sequencing;Requires tactile cues;Requires verbal cues General Comments: improved arousal level this date, difficulty with sustained attention d/t fatigue        Exercises Other Exercises Other Exercises: sustained stretch/PROM to L elbow   Shoulder Instructions        General Comments      Pertinent Vitals/ Pain       Pain Assessment: Faces Pain Score: 1  Faces Pain Scale: Hurts a little bit Pain Location: LEft elbow extension  Pain Descriptors / Indicators: Grimacing Pain Intervention(s): Limited activity within patient's tolerance;Monitored during session  Home Living                                          Prior Functioning/Environment              Frequency  Min 2X/week        Progress Toward Goals  OT Goals(current goals can now be found in the care plan section)  Progress towards OT goals: Progressing toward goals     Plan Discharge plan remains appropriate    Co-evaluation                 AM-PAC OT "6 Clicks" Daily Activity     Outcome Measure   Help from another person eating meals?: Total Help from another person taking care of personal grooming?: Total Help from another person toileting, which includes using toliet, bedpan, or urinal?: Total Help from another person bathing (including washing, rinsing, drying)?: Total Help from another person to put on and taking off regular upper body clothing?: Total Help from another person to put on and taking off regular lower body clothing?: Total 6 Click Score: 6    End of Session Equipment Utilized During Treatment: Oxygen  OT Visit Diagnosis: Hemiplegia and hemiparesis Hemiplegia - Right/Left: Left Hemiplegia - dominant/non-dominant: Non-Dominant Hemiplegia - caused by: Cerebral infarction   Activity Tolerance Patient tolerated treatment well   Patient Left in bed;with call bell/phone within reach;Other (comment);with nursing/sitter in room   Nurse Communication Mobility status;Other (comment) (skin breakdown and status of sacral pad)    Time: 1130-1200 OT Time Calculation (min): 30 min  Charges: OT General Charges $OT Visit: 1 Visit OT Treatments $Self Care/Home Management : 8-22 mins $Neuromuscular Re-education: 8-22  mins  [name] OTR/L acute rehab services Office: [phone]  Joya Gaskins 03/09/2020, 1:44 PM

## 2020-03-09 NOTE — Progress Notes (Signed)
Nutrition Follow-up  DOCUMENTATION CODES:   Non-severe (moderate) malnutrition in context of chronic illness  INTERVENTION:   Given pt's declining po intake, recommend increasing TF rate to meet estimated needs via PEG:  -Osmolite 1.2 via PEG at 30 mL/hr and increase by 61mL q 4 hours to goal of 28mL/hr.  TF regimen provides 1728 kcals, 80 grams of protein, and 1166 mL of free water.  Meets 100% of estimated calorie and protein needs.   Ensure Enlive po BID, each supplement provides 350 kcal and 20 grams of protein.   NUTRITION DIAGNOSIS:   Moderate Malnutrition related to chronic illness (HIV) as evidenced by mild muscle depletion, moderate muscle depletion, mild fat depletion, moderate fat depletion.  Ongoing  GOAL:   Patient will meet greater than or equal to 90% of their needs  Ongoing   MONITOR:   PO intake, Supplement acceptance, Skin, Diet advancement, Weight trends, Labs, I & O's  REASON FOR ASSESSMENT:   Consult Enteral/tube feeding initiation and management  ASSESSMENT:   79 yo female presents with left sided weakness, facial droop, aphasia and right eye deviation and  admitted with ischemic stoke insetting of large vessel occlusion of right MCA M1 segment requiring revascularization, acute blood loss anemia with concern of retroperitoneal bleed, respiratory failure requiring intubation. PMH includes HIV, HTN, HLD, DM, depression, hx of breast cancer  10/07 - admitted, intubated, s/p revascularization of R MCA M1 occlusion 10/08 -CT chest with new mass consistent with malignant adenocarcinoma,RI multifocal infarct with mild petechial hemorrhage with small L cerebella infarct 10/09 - chest tube placed for large left pleural effusion 10/19 - GOC discussion, plan for trach/PEG 10/20 - trach, cortrak placed 10/28 failed PSV due to tachypnea 10/29 PEG placed 11/2 MBS, dysphagia 1 diet with thin liquids ordered  Per discussion with RN, pt is currently consuming  nothing by mouth. TF via PEG is sole source of nutrition at this time. Ensure Jeanne Ivan is being given via PEG tube by nursing staff. Breakfast tray was at bedside with 0% consumed.   Pt continues to receive TF via PEG. Increase TF rate to meet estimated needs since po intake is declining. Per RN pt is currently receiving nothing by mouth. Continue with oral nutrition supplements to encourage po intake. If po intake improves, TF can be decreased.   Labs reviewed: Glucose 195.   Medications reviewed.   Diet Order:   Diet Order            DIET - DYS 1 Room service appropriate? No; Fluid consistency: Thin  Diet effective now                 EDUCATION NEEDS:   Not appropriate for education at this time  Skin:  Skin Assessment: Skin Integrity Issues: Skin Integrity Issues:: Stage II Stage II: R perineum  Last BM:  unknown  Height:   Ht Readings from Last 1 Encounters:  01/29/20 4\' 10"  (1.473 m)    Weight:   Wt Readings from Last 1 Encounters:  03/07/20 56.8 kg    Ideal Body Weight:     BMI:  Body mass index is 26.17 kg/m.  Estimated Nutritional Needs:   Kcal:  1650-1850 kcals  Protein:  75-90 g  Fluid:  >/= 1.7  L    Ronnald Nian, Dietetic Intern Pager: [phone] If unavailable: [phone]

## 2020-03-09 NOTE — TOC Progression Note (Signed)
LATE NOTE SUBMISSION    Transition of Care [facility]) - Progression Note    Patient Details  Name: Amber Lopez MRN: [id number] Date of Birth: [birth year]  Transition of [facility]) CM/SW [place], [place] Phone Number: 03/09/2020, 2:50 PM  Clinical Narrative:   CSW spoke with Admissions at [facility] to discuss if patient would be appropriate for their trach program at [facility]. [facility] reviewing information, but not sure if they can take her because they don't know if they have capacity for another long term trach patient. CSW to follow.    Expected Discharge Plan: [place] Barriers to Discharge: Continued Medical Work up, SNF Pending bed offer  Expected Discharge Plan and Services Expected Discharge Plan: [place] In-house Referral: Clinical Social Work Discharge Planning Services: CM Consult Post Acute Care Choice: [place] arrangements for the past 2 months: Single Family Home                                       Social Determinants of Health (SDOH) Interventions    Readmission Risk Interventions No flowsheet data found.

## 2020-03-09 NOTE — TOC Progression Note (Signed)
Transition of Care [facility]) - Progression Note    Patient Details  Name: Amber Lopez MRN: [id number] Date of Birth: 1941/02/24  Transition of Care [facility]) CM/SW [place], [place] Phone Number: 03/09/2020, 2:57 PM  Clinical Narrative:   CSW reached out to Admissions at [facility] to ask on whether a decision was made on patient yet. Admissions still has not heard from the Administration, will send an update to try to get an answer. CSW to follow.    Expected Discharge Plan: [place] Barriers to Discharge: Continued Medical Work up, SNF Pending bed offer  Expected Discharge Plan and Services Expected Discharge Plan: [place] In-house Referral: Clinical Social Work Discharge Planning Services: CM Consult Post Acute Care Choice: [place] arrangements for the past 2 months: Single Family Home                                       Social Determinants of Health (SDOH) Interventions    Readmission Risk Interventions No flowsheet data found.

## 2020-03-10 DIAGNOSIS — R1312 Dysphagia, oropharyngeal phase: Secondary | ICD-10-CM | POA: Diagnosis not present

## 2020-03-10 DIAGNOSIS — I63511 Cerebral infarction due to unspecified occlusion or stenosis of right middle cerebral artery: Secondary | ICD-10-CM | POA: Diagnosis not present

## 2020-03-10 DIAGNOSIS — Z930 Tracheostomy status: Secondary | ICD-10-CM | POA: Diagnosis not present

## 2020-03-10 LAB — GLUCOSE, CAPILLARY
Glucose-Capillary: 105 mg/dL — ABNORMAL HIGH (ref 70–99)
Glucose-Capillary: 114 mg/dL — ABNORMAL HIGH (ref 70–99)
Glucose-Capillary: 152 mg/dL — ABNORMAL HIGH (ref 70–99)
Glucose-Capillary: 182 mg/dL — ABNORMAL HIGH (ref 70–99)
Glucose-Capillary: 194 mg/dL — ABNORMAL HIGH (ref 70–99)
Glucose-Capillary: 210 mg/dL — ABNORMAL HIGH (ref 70–99)
Glucose-Capillary: 212 mg/dL — ABNORMAL HIGH (ref 70–99)
Glucose-Capillary: 91 mg/dL (ref 70–99)

## 2020-03-10 MED ORDER — LOPERAMIDE HCL 1 MG/7.5ML PO SUSP
2.0000 mg | ORAL | Status: DC | PRN
  Filled 2020-03-10: qty 15

## 2020-03-10 NOTE — TOC Progression Note (Signed)
Transition of Care [facility]) - Progression Note    Patient Details  Name: Amber Lopez MRN: [id number] Date of Birth: [birth year]  Transition of Care [facility]) CM/SW [place], [place] Phone Number: 03/10/2020, 4:35 PM  Clinical Narrative:   CSW reached out to [facility], and there is still no answer from Administration. CSW reached out to other pending facilities, as patient's trach will be 4 days old on 11/20. [place], [place], and [place] are unable to accept any trach patients at this time. CSW asked [facility], and has not heard anything back. CSW to follow.     Expected Discharge Plan: [place] Barriers to Discharge: Continued Medical Work up, SNF Pending bed offer  Expected Discharge Plan and Services Expected Discharge Plan: [place] In-house Referral: Clinical Social Work Discharge Planning Services: CM Consult Post Acute Care Choice: [place] arrangements for the past 2 months: Single Family Home                                       Social Determinants of Health (SDOH) Interventions    Readmission Risk Interventions No flowsheet data found.

## 2020-03-10 NOTE — Progress Notes (Signed)
[facility] PROGRESS NOTE  Amber Lopez VZD:[id number] DOB: 1940/11/11 DOA: 01/29/2020 PCP: Billie Ruddy, MD     11/16   Status: Remains inpatient appropriate because:Altered mental status, Unsafe d/c plan and Trach placed this admission on 10/20 and needs to be in place 30 days before can be considered for SNF placement   Dispo: The patient is from: Home              Anticipated d/c is to: SNF              Anticipated d/c date is: > 3 days              Patient currently is medically stable to d/c.   Code Status: DNR Family Communication: 11/15 spoke with daughter [name] DVT prophylaxis: Lovenox Vaccination status: Patient completed both doses of Pfizer Covid vaccine on 09/01/2019  Foley catheter: No  HPI: 79 year old female with past medical history for HIV/AIDS, hypertension, dyslipidemia, diabetes, depression, arthritis and breast cancer.  Patient presented to the ER on 10/7 with strokelike symptoms consisting of left hemiplegia, facial droop, aphasia.  CTA head neck showed emergent LVO at the right M1 segment with patient subsequently undergoing TPA and revascularization by neurology and IR.  She was admitted to the ICU postprocedure on mechanical ventilation.  Unfortunately patient's mentation and neurological status remained poor.  She was not able to follow commands and she was unable to wean from the ventilator.  Tracheostomy tube was placed on 10/20 and a PEG tube was placed on 10/29.  Unfortunately her neurological status has not improved.  Other complications since admission include a large left pleural effusion/left apical mass with pathology assistant with metastatic adenocarcinoma.  She has been denied for LTAC placement.  Palliative care has been discussing with patient family her poor prognosis and initially family requested patient be transferred to another facility but no clinical reason to transfer therefore patient remains facility.  Subjective: Awake and  alert.  Being fed by nursing tech.  Tolerating feeding without choking or coughing.  Patient replying yes to questions asked.  Objective: Vitals:   03/10/20 0745 03/10/20 0757  BP:  (!) 129/52  Pulse: 72   Resp: (!) 21 (!) 22  Temp:  97.9 F (36.6 C)  SpO2: 100% 100%    Intake/Output Summary (Last 24 hours) at 03/10/2020 1108 Last data filed at 03/10/2020 1028 Gross per 24 hour  Intake 950 ml  Output 500 ml  Net 450 ml   Filed Weights   03/05/20 0500 03/07/20 0500 03/10/20 0340  Weight: 56 kg 56.8 kg 56.7 kg    Exam:  Constitutional: NAD, calm, comfortable Respiratory: clear to auscultation bilaterally. Normal respiratory effort. #6 cuffless trach  Cardiovascular: Regular rate and rhythm, No lower extremity edema.  Adequate capillary refill Abdomen: Non tender, Bowel sounds +.PEG tube; tolerating D1 diet when fed Musculoskeletal: No joint deformity upper and lower extremities.  Bilateral upper extremities flexed to chest.  LUE Pillow splint ordered for bedtime use x6 hours.  No spasticity observed with movement Neurologic: CN 2-12 grossly intact.  Sensation intact. Left hemineglect - left side weaker than right. Psychiatric: Alert and oriented to name and questionably to place.  Tends to answer yes ma'am to most questions asked.   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 -Initially had phase of prolonged unresponsiveness but as of late has been more alert, participating in therapies and attempting to  phonate -Keeps both arms flexed with left side more at risk for contracture given left hemineglect therefore OT recommended pillow splint at bedtime -Continue SLP (swallowing and speech/cognition)  Acute ventilatory dependent respiratory failure with hypoxemia/Inability to clear secretions/requires tracheostomy tube/COPD -Stable on 21% FiO2 and tolerating #6 cuffless  trach -MBS: Tolerating D1 diet when fed -Use PMV with all oral intake under direction of SLP -Trach team recommends trial of CPT and Yupelri (LABA) to aid with secretions-ready for capping trials at this juncture (will likely need preauthorization if Yupelri to continue after discharge)  Stage IV adenocarcinoma of the left lung with left pleural effusion  -CT of chest at time of admission w/ persistent large left pleural effusion with near complete left lung collapse. Left apical mass measuring 3.5 x 3.3 cm suspicious for bronchogenic carcinoma -Pleural fluid pathology consistent with metastatic adenocarcinoma -poor performance status precludes chemotherapy with metastatic disease; 11/15 reinforced this with patient's daughter by telephone -Patient will follow with oncology as an outpatient if improves clinically (was evaluated by Dr. Narda Rutherford during this hospitalization)  Dysphagia/ Nutrition Status: Nutrition Problem: Moderate Malnutrition Etiology: chronic illness (HIV) Signs/Symptoms: mild muscle depletion, moderate muscle depletion, mild fat depletion, moderate fat depletion Interventions: Ensure Enlive (each supplement provides 350kcal and 20 grams of protein), Tube feeding, MVI  Estimated body mass index is 26.13 kg/m as calculated from the following:   Height as of this encounter: 4\' 10"  (1.473 m).   Weight as of this encounter: 56.7 kg.  Acute diarrhea -Resolved -Continue Bentyl and change Imodium to as needed  GOC  -Family had requested transfer to another facility when informed poor prognosis.  At this time no clinical reason to transfer to other facility.  Palliative care consult appreciated. -LTAC denied -Case management working to find SNF  Decubitus ulcer not POA Pressure Injury 02/16/20 Perineum Right Stage 2 -  Partial thickness loss of dermis presenting as a shallow open injury with a red, pink wound bed without slough. 2cm x 1cm pink (Active)  Date First  Assessed/Time First Assessed: 02/16/20 1200   Location: Perineum  Location Orientation: Right  Staging: Stage 2 -  Partial thickness loss of dermis presenting as a shallow open injury with a red, pink wound bed without slough.  Wound Descri...    Assessments 02/16/2020 12:00 PM 03/10/2020  9:41 AM  Dressing Type Moisture barrier --  Site / Wound Assessment Clean;Pink --  Peri-wound Assessment Intact --  Wound Length (cm) 2 cm 5 cm  Wound Width (cm) 1 cm 2 cm  Wound Depth (cm) 0 cm 0 cm  Wound Surface Area (cm^2) 2 cm^2 10 cm^2  Wound Volume (cm^3) 0 cm^3 0 cm^3  Margins Unattached edges (unapproximated) --  Drainage Amount Scant --  Treatment Cleansed --     No Linked orders to display      Other problems: Hypertension/left ventricular diastolic dysfunction -Heart rate and blood pressure stable on Coreg. -No clinical signs consistent with heart failure  HIV/AIDS -Continue home medications including: atovaquone, dolutegravir and descovy; discussing with pharmacy and ID about transitioning to more affordable option since we are pursuing SNF care and expensive medications may be barrier to placement  Type 2 diabetes mellitus -A1c 6.5 on 10/7. -Currently stable on 15 units Levemir twice daily with sliding scale insulin.  Chronic normocytic anemia -Hemoglobin stable at 9.0    Data Reviewed: Basic Metabolic Panel: Recent Labs  Lab 03/08/20 0442 03/09/20 0818  NA 132* 133*  K 5.4* 5.1  CL  95* 95*  CO2 27 27  GLUCOSE 157* 212*  BUN 22 23  CREATININE 1.04* 1.00  CALCIUM 8.9 9.0   Liver Function Tests: Recent Labs  Lab 03/08/20 0442  AST 35  ALT 23  ALKPHOS 79  BILITOT 0.9  PROT 7.5  ALBUMIN 2.1*   No results for input(s): LIPASE, AMYLASE in the last 168 hours. No results for input(s): AMMONIA in the last 168 hours. CBC: Recent Labs  Lab 03/08/20 0442  WBC 11.2*  HGB 9.4*  HCT 29.7*  MCV 92.2  PLT 507*   Cardiac Enzymes: No results for input(s):  CKTOTAL, CKMB, CKMBINDEX, TROPONINI in the last 168 hours. BNP (last 3 results) No results for input(s): BNP in the last 8760 hours.  ProBNP (last 3 results) Recent Labs    08/07/19 1422  PROBNP 110.0*    CBG: Recent Labs  Lab 03/09/20 1544 03/09/20 2003 03/09/20 2329 03/10/20 0331 03/10/20 0803  GLUCAP 112* 155* 133* 91 152*    No results found for this or any previous visit (from the past 240 hour(s)).   Studies: No results found.  Scheduled Meds: . aspirin  81 mg Per Tube Daily  . atovaquone  1,500 mg Per Tube Q breakfast  . carvedilol  3.125 mg Per Tube BID WC  . chlorhexidine  15 mL Mouth Rinse BID  . Chlorhexidine Gluconate Cloth  6 each Topical Daily  . dicyclomine  10 mg Per Tube TID AC & HS  . dolutegravir  50 mg Oral Daily  . emtricitabine-tenofovir AF  1 tablet Oral Daily  . enoxaparin (LOVENOX) injection  40 mg Subcutaneous Q24H  . feeding supplement  237 mL Oral BID BM  . free water  200 mL Per Tube Q8H  . influenza vaccine adjuvanted  0.5 mL Intramuscular Tomorrow-1000  . insulin aspart  0-15 Units Subcutaneous Q4H  . insulin detemir  15 Units Subcutaneous BID  . loperamide HCl  2 mg Per Tube Daily  . mouth rinse  15 mL Mouth Rinse q12n4p  . multivitamin  15 mL Per Tube Daily  . pantoprazole sodium  40 mg Per Tube QHS  . revefenacin  175 mcg Nebulization Daily  . rosuvastatin  10 mg Per NG tube Daily   Continuous Infusions: . sodium chloride 250 mL (02/11/20 2212)  . sodium chloride Stopped (02/09/20 0556)  . feeding supplement (OSMOLITE 1.2 CAL) 1,000 mL (03/09/20 1520)    Active Problems:   DNR (do not resuscitate) discussion   Acute right MCA stroke ([place])   Middle cerebral artery embolism, right   Malnutrition of moderate degree   Adenocarcinoma of left lung ([place])   Adenocarcinoma ([place])   Palliative care by specialist   Infiltrate noted on imaging study   Pressure injury of skin   Endotracheal tube present   Pleural effusion on left    Acute respiratory failure with hypoxia ([place])   Tracheostomy in place ([place])   Ventilator dependent ([place])   Oropharyngeal dysphagia   Consultants:  Neurology  Interventional radiology  PCCM  Oncology  Palliative care  Procedures:  Echocardiogram  EEG  Core track placement  Tracheostomy tube  PEG tube  Antibiotics: Anti-infectives (From admission, onward)   Start     Dose/Rate Route Frequency Ordered Stop   03/05/20 1000  dolutegravir (TIVICAY) tablet 50 mg        50 mg Oral Daily 03/04/20 1008     03/05/20 1000  emtricitabine-tenofovir AF (DESCOVY) 200-25 MG per tablet 1 tablet  1 tablet Oral Daily 03/04/20 1008     03/04/20 0930  atovaquone (MEPRON) 750 MG/5ML suspension 1,500 mg        1,500 mg Per Tube Daily with breakfast 03/04/20 0833     03/04/20 0800  Darunavir-Cobicisctat-Emtricitabine-Tenofovir Alafenamide (SYMTUZA) 800-150-200-10 MG TABS 1 tablet  Status:  Discontinued        1 tablet Oral Daily with breakfast 03/03/20 1345 03/04/20 1008   02/12/20 1000  vancomycin (VANCOREADY) IVPB 1250 mg/250 mL        1,250 mg 166.7 mL/hr over 90 Minutes Intravenous Every 24 hours 02/11/20 1509 02/18/20 1438   02/07/20 1310  vancomycin (VANCOREADY) IVPB 750 mg/150 mL  Status:  Discontinued        750 mg 150 mL/hr over 60 Minutes Intravenous Every 12 hours 02/07/20 1310 02/11/20 1509   02/06/20 0000  vancomycin (VANCOREADY) IVPB 500 mg/100 mL  Status:  Discontinued        500 mg 100 mL/hr over 60 Minutes Intravenous Every 12 hours 02/05/20 1141 02/07/20 1310   02/05/20 1300  ceFEPIme (MAXIPIME) 2 g in sodium chloride 0.9 % 100 mL IVPB        2 g 200 mL/hr over 30 Minutes Intravenous Every 12 hours 02/05/20 1127 02/11/20 2140   02/05/20 1300  atovaquone (MEPRON) 750 MG/5ML suspension 1,500 mg  Status:  Discontinued        1,500 mg Per Tube Daily with breakfast 02/05/20 1137 03/03/20 1345   02/05/20 1230  vancomycin (VANCOCIN) IVPB 1000 mg/200 mL premix         1,000 mg 200 mL/hr over 60 Minutes Intravenous  Once 02/05/20 1127 02/05/20 1304   01/30/20 1000  dolutegravir (TIVICAY) tablet 50 mg  Status:  Discontinued        50 mg Oral Daily 01/30/20 0941 03/03/20 1345   01/30/20 1000  emtricitabine-tenofovir AF (DESCOVY) 200-25 MG per tablet 1 tablet  Status:  Discontinued        1 tablet Per Tube Daily 01/30/20 0941 03/03/20 1345   01/29/20 1600  emtricitabine-tenofovir AF (DESCOVY) 200-25 MG per tablet 1 tablet  Status:  Discontinued        1 tablet Per Tube Daily 01/29/20 1437 01/29/20 1507   01/29/20 1600  dolutegravir (TIVICAY) tablet 50 mg  Status:  Discontinued        50 mg Oral Daily 01/29/20 1437 01/29/20 1507   01/29/20 1415  bictegravir-emtricitabine-tenofovir AF (BIKTARVY) 50-200-25 MG per tablet 1 tablet  Status:  Discontinued        1 tablet Oral Daily 01/29/20 1408 01/29/20 1437   01/29/20 1045  amoxicillin-clavulanate (AUGMENTIN) 875-125 MG per tablet 1 tablet  Status:  Discontinued        1 tablet Oral 2 times daily 01/29/20 1041 01/29/20 1510   01/29/20 1045  bictegravir-emtricitabine-tenofovir AF (BIKTARVY) 50-200-25 MG per tablet 1 tablet  Status:  Discontinued        1 tablet Oral Daily 01/29/20 1041 01/29/20 1042   01/29/20 0913  ceFAZolin (ANCEF) 2-4 GM/100ML-% IVPB       Note to Pharmacy: Tressie Stalker   : cabinet override      01/29/20 0913 01/29/20 2114       Time spent: 20 minutes    Erin Hearing ANP  [facility] Pager [phone]. If 7PM-7AM, please contact night-coverage at [email or web address] 03/10/2020, 11:08 AM  LOS: 41 days

## 2020-03-11 DIAGNOSIS — E440 Moderate protein-calorie malnutrition: Secondary | ICD-10-CM | POA: Diagnosis not present

## 2020-03-11 DIAGNOSIS — R627 Adult failure to thrive: Secondary | ICD-10-CM | POA: Diagnosis not present

## 2020-03-11 DIAGNOSIS — Z930 Tracheostomy status: Secondary | ICD-10-CM | POA: Diagnosis not present

## 2020-03-11 DIAGNOSIS — C3492 Malignant neoplasm of unspecified part of left bronchus or lung: Secondary | ICD-10-CM | POA: Diagnosis not present

## 2020-03-11 DIAGNOSIS — Z515 Encounter for palliative care: Secondary | ICD-10-CM | POA: Diagnosis not present

## 2020-03-11 DIAGNOSIS — I63511 Cerebral infarction due to unspecified occlusion or stenosis of right middle cerebral artery: Secondary | ICD-10-CM | POA: Diagnosis not present

## 2020-03-11 LAB — GLUCOSE, CAPILLARY
Glucose-Capillary: 121 mg/dL — ABNORMAL HIGH (ref 70–99)
Glucose-Capillary: 151 mg/dL — ABNORMAL HIGH (ref 70–99)
Glucose-Capillary: 158 mg/dL — ABNORMAL HIGH (ref 70–99)
Glucose-Capillary: 170 mg/dL — ABNORMAL HIGH (ref 70–99)
Glucose-Capillary: 178 mg/dL — ABNORMAL HIGH (ref 70–99)
Glucose-Capillary: 214 mg/dL — ABNORMAL HIGH (ref 70–99)

## 2020-03-11 MED ORDER — ALBUTEROL SULFATE (2.5 MG/3ML) 0.083% IN NEBU: 2.5000 mg | INHALATION_SOLUTION | RESPIRATORY_TRACT | Status: DC | PRN

## 2020-03-11 NOTE — Progress Notes (Signed)
[facility] PROGRESS NOTE  Amber Lopez SAY:[id number] DOB: 08-Jun-1940 DOA: 01/29/2020 PCP: Billie Ruddy, MD     11/16   Status: Remains inpatient appropriate because:Altered mental status, Unsafe d/c plan and Trach placed this admission on 10/20 and needs to be in place 30 days before can be considered for SNF placement   Dispo: The patient is from: Home              Anticipated d/c is to: SNF              Anticipated d/c date is: > 3 days              Patient currently is medically stable to d/c.   Code Status: DNR Family Communication: 11/15 spoke with daughter Amber Lopez DVT prophylaxis: Lovenox Vaccination status: Patient completed both doses of Pfizer Covid vaccine on 09/01/2019  Foley catheter: No  HPI: 79 year old female with past medical history for HIV/AIDS, hypertension, dyslipidemia, diabetes, depression, arthritis and breast cancer.  Patient presented to the ER on 10/7 with strokelike symptoms consisting of left hemiplegia, facial droop, aphasia.  CTA head neck showed emergent LVO at the right M1 segment with patient subsequently undergoing TPA and revascularization by neurology and IR.  She was admitted to the ICU postprocedure on mechanical ventilation.  Unfortunately patient's mentation and neurological status remained poor.  She was not able to follow commands and she was unable to wean from the ventilator.  Tracheostomy tube was placed on 10/20 and a PEG tube was placed on 10/29.  Unfortunately her neurological status has not improved.  Other complications since admission include a large left pleural effusion/left apical mass with pathology assistant with metastatic adenocarcinoma.  She has been denied for LTAC placement.  Palliative care has been discussing with patient family her poor prognosis and initially family requested patient be transferred to another facility but no clinical reason to transfer therefore patient remains facility.  Subjective: Awake and  alert.  Sitting up in bed.  Noted with purposeful movement/manipulating blanket from home that is on her bed.  Able to speak today and simple 1-2 word sentences with measured speech.  Objective: Vitals:   03/11/20 0813 03/11/20 0925  BP: 135/70 135/70  Pulse:  96  Resp: 17   Temp: 98.5 F (36.9 C)   SpO2: 99%     Intake/Output Summary (Last 24 hours) at 03/11/2020 1105 Last data filed at 03/10/2020 2209 Gross per 24 hour  Intake 80 ml  Output 1100 ml  Net -1020 ml   Filed Weights   03/07/20 0500 03/10/20 0340 03/11/20 0500  Weight: 56.8 kg 56.7 kg 59.2 kg    Exam:  Constitutional: NAD, calm, peers to be comfortable Respiratory: clear to auscultation bilaterally. Normal respiratory effort. #6 cuffless trach with no secretions observed Cardiovascular: Regular rate and rhythm, No lower extremity edema.  Adequate capillary refill Abdomen: Non tender, Bowel sounds +.PEG tube; tolerating D1 diet when fed Musculoskeletal:  LUE Pillow splint ordered for bedtime use x6 hours.  No spasticity observed with movement Neurologic: CN 2-12 grossly intact.  Sensation intact. Left hemineglect persists- left side weaker than right. Psychiatric: Alert and oriented to name and place.  H more conversant with appropriate responses to questions asked.  Speech limited to 1-2 words and is quite measured in tempo.   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 -Initially had phase of prolonged unresponsiveness but now more alert  and appropriately responsive, participating in therapies and attempting to phonate -Keeps both arms flexed with left side more at risk for contracture given left hemineglect therefore OT recommended pillow splint at bedtime -Continue SLP (swallowing and speech/cognition)  Acute ventilatory dependent respiratory failure with hypoxemia/Inability to clear secretions/requires  tracheostomy tube/COPD -Stable on 21% FiO2 and tolerating #6 cuffless trach trach team plans to change out to number for trach later today per RT with subsequent initiation of capping trials-hopeful for eventual decannulation -MBS: Tolerating D1 diet when fed -Use PMV with all oral intake under direction of SLP -Trach team recommended CPT and Yupelri (LABA) to aid with secretions with significant improvement subsequently.   Stage IV adenocarcinoma of the left lung with left pleural effusion  -CT of chest at time of admission: w/ large left pleural effusion with near complete left lung collapse. Left apical mass measuring 3.5 x 3.3 cm suspicious for bronchogenic carcinoma -Pleural fluid pathology consistent with metastatic adenocarcinoma -poor performance status precludes chemotherapy with metastatic disease; 11/15 reinforced this with patient's daughter by telephone -Patient will follow with oncology as an outpatient if improves clinically (was evaluated by Dr. Narda Rutherford during this hospitalization)  Dysphagia/ Nutrition Status: Nutrition Status: Nutrition Problem: Moderate Malnutrition Etiology: chronic illness (HIV) Signs/Symptoms: mild muscle depletion, moderate muscle depletion, mild fat depletion, moderate fat depletion Interventions: Ensure Enlive (each supplement provides 350kcal and 20 grams of protein), Tube feeding, MVI-patient also tolerating D1 diet when fed Estimated body mass index is 27.28 kg/m as calculated from the following:   Height as of this encounter: 4\' 10"  (1.473 m).   Weight as of this encounter: 59.2 kg.  Acute diarrhea -Resolved -Continue Bentyl and prn Imodium   GOC  -Family had requested transfer to another facility when informed poor prognosis.  At this time no clinical reason to transfer to other facility.  Palliative care consult appreciated. -LTAC denied -Case management assisting with SNF placement  Decubitus ulcer not POA Pressure Injury  02/16/20 Perineum Right Stage 2 -  Partial thickness loss of dermis presenting as a shallow open injury with a red, pink wound bed without slough. 2cm x 1cm pink (Active)  Date First Assessed/Time First Assessed: 02/16/20 1200   Location: Perineum  Location Orientation: Right  Staging: Stage 2 -  Partial thickness loss of dermis presenting as a shallow open injury with a red, pink wound bed without slough.  Wound Descri...    Assessments 02/16/2020 12:00 PM 03/11/2020  8:00 AM  Dressing Type Moisture barrier Other (Comment)  Dressing -- Clean;Dry;Intact  Dressing Change Frequency -- Every 3 days  State of Healing -- Epithelialized  Site / Wound Assessment Clean;Pink Pink;Red  Peri-wound Assessment Intact Intact  Wound Length (cm) 2 cm --  Wound Width (cm) 1 cm --  Wound Depth (cm) 0 cm --  Wound Surface Area (cm^2) 2 cm^2 --  Wound Volume (cm^3) 0 cm^3 --  Margins Unattached edges (unapproximated) Unattached edges (unapproximated)  Drainage Amount Scant Scant  Drainage Description -- Serous  Treatment Cleansed Cleansed     No Linked orders to display      Other problems: Hypertension/left ventricular diastolic dysfunction -Heart rate and blood pressure stable on Coreg. -No clinical signs consistent with heart failure  HIV/AIDS -Continue home medications including: atovaquone, dolutegravir and descovy; discussing with pharmacy and ID about transitioning to more affordable option since we are pursuing SNF care and expensive medications may be barrier to placement  Type 2 diabetes mellitus -A1c 6.5 on 10/7. -Currently stable  on 15 units Levemir twice daily with sliding scale insulin.  Chronic normocytic anemia -Hemoglobin stable at 9.0    Data Reviewed: Basic Metabolic Panel: Recent Labs  Lab 03/08/20 0442 03/09/20 0818  NA 132* 133*  K 5.4* 5.1  CL 95* 95*  CO2 27 27  GLUCOSE 157* 212*  BUN 22 23  CREATININE 1.04* 1.00  CALCIUM 8.9 9.0   Liver Function  Tests: Recent Labs  Lab 03/08/20 0442  AST 35  ALT 23  ALKPHOS 79  BILITOT 0.9  PROT 7.5  ALBUMIN 2.1*   No results for input(s): LIPASE, AMYLASE in the last 168 hours. No results for input(s): AMMONIA in the last 168 hours. CBC: Recent Labs  Lab 03/08/20 0442  WBC 11.2*  HGB 9.4*  HCT 29.7*  MCV 92.2  PLT 507*   Cardiac Enzymes: No results for input(s): CKTOTAL, CKMB, CKMBINDEX, TROPONINI in the last 168 hours. BNP (last 3 results) No results for input(s): BNP in the last 8760 hours.  ProBNP (last 3 results) Recent Labs    08/07/19 1422  PROBNP 110.0*    CBG: Recent Labs  Lab 03/10/20 1714 03/10/20 1943 03/10/20 2331 03/11/20 0419 03/11/20 0810  GLUCAP 105* 212* 182* 151* 121*    No results found for this or any previous visit (from the past 240 hour(s)).   Studies: No results found.  Scheduled Meds: . aspirin  81 mg Per Tube Daily  . atovaquone  1,500 mg Per Tube Q breakfast  . carvedilol  3.125 mg Per Tube BID WC  . chlorhexidine  15 mL Mouth Rinse BID  . Chlorhexidine Gluconate Cloth  6 each Topical Daily  . dicyclomine  10 mg Per Tube TID AC & HS  . dolutegravir  50 mg Oral Daily  . emtricitabine-tenofovir AF  1 tablet Oral Daily  . enoxaparin (LOVENOX) injection  40 mg Subcutaneous Q24H  . feeding supplement  237 mL Oral BID BM  . free water  200 mL Per Tube Q8H  . influenza vaccine adjuvanted  0.5 mL Intramuscular Tomorrow-1000  . insulin aspart  0-15 Units Subcutaneous Q4H  . insulin detemir  15 Units Subcutaneous BID  . mouth rinse  15 mL Mouth Rinse q12n4p  . multivitamin  15 mL Per Tube Daily  . pantoprazole sodium  40 mg Per Tube QHS  . rosuvastatin  10 mg Per NG tube Daily   Continuous Infusions: . sodium chloride 250 mL (02/11/20 2212)  . sodium chloride Stopped (02/09/20 0556)  . feeding supplement (OSMOLITE 1.2 CAL) 1,000 mL (03/10/20 1431)    Active Problems:   DNR (do not resuscitate) discussion   Acute right MCA stroke  ([place])   Middle cerebral artery embolism, right   Malnutrition of moderate degree   Adenocarcinoma of left lung ([place])   Adenocarcinoma ([place])   Palliative care by specialist   Infiltrate noted on imaging study   Pressure injury of skin   Endotracheal tube present   Pleural effusion on left   Acute respiratory failure with hypoxia ([place])   Tracheostomy in place ([place])   Ventilator dependent ([place])   Oropharyngeal dysphagia   Consultants:  Neurology  Interventional radiology  PCCM  Oncology  Palliative care  Procedures:  Echocardiogram  EEG  Core track placement  Tracheostomy tube  PEG tube  Antibiotics: Anti-infectives (From admission, onward)   Start     Dose/Rate Route Frequency Ordered Stop   03/05/20 1000  dolutegravir (TIVICAY) tablet 50 mg  50 mg Oral Daily 03/04/20 1008     03/05/20 1000  emtricitabine-tenofovir AF (DESCOVY) 200-25 MG per tablet 1 tablet        1 tablet Oral Daily 03/04/20 1008     03/04/20 0930  atovaquone (MEPRON) 750 MG/5ML suspension 1,500 mg        1,500 mg Per Tube Daily with breakfast 03/04/20 0833     03/04/20 0800  Darunavir-Cobicisctat-Emtricitabine-Tenofovir Alafenamide (SYMTUZA) 800-150-200-10 MG TABS 1 tablet  Status:  Discontinued        1 tablet Oral Daily with breakfast 03/03/20 1345 03/04/20 1008   02/12/20 1000  vancomycin (VANCOREADY) IVPB 1250 mg/250 mL        1,250 mg 166.7 mL/hr over 90 Minutes Intravenous Every 24 hours 02/11/20 1509 02/18/20 1438   02/07/20 1310  vancomycin (VANCOREADY) IVPB 750 mg/150 mL  Status:  Discontinued        750 mg 150 mL/hr over 60 Minutes Intravenous Every 12 hours 02/07/20 1310 02/11/20 1509   02/06/20 0000  vancomycin (VANCOREADY) IVPB 500 mg/100 mL  Status:  Discontinued        500 mg 100 mL/hr over 60 Minutes Intravenous Every 12 hours 02/05/20 1141 02/07/20 1310   02/05/20 1300  ceFEPIme (MAXIPIME) 2 g in sodium chloride 0.9 % 100 mL IVPB        2 g 200 mL/hr over 30 Minutes  Intravenous Every 12 hours 02/05/20 1127 02/11/20 2140   02/05/20 1300  atovaquone (MEPRON) 750 MG/5ML suspension 1,500 mg  Status:  Discontinued        1,500 mg Per Tube Daily with breakfast 02/05/20 1137 03/03/20 1345   02/05/20 1230  vancomycin (VANCOCIN) IVPB 1000 mg/200 mL premix        1,000 mg 200 mL/hr over 60 Minutes Intravenous  Once 02/05/20 1127 02/05/20 1304   01/30/20 1000  dolutegravir (TIVICAY) tablet 50 mg  Status:  Discontinued        50 mg Oral Daily 01/30/20 0941 03/03/20 1345   01/30/20 1000  emtricitabine-tenofovir AF (DESCOVY) 200-25 MG per tablet 1 tablet  Status:  Discontinued        1 tablet Per Tube Daily 01/30/20 0941 03/03/20 1345   01/29/20 1600  emtricitabine-tenofovir AF (DESCOVY) 200-25 MG per tablet 1 tablet  Status:  Discontinued        1 tablet Per Tube Daily 01/29/20 1437 01/29/20 1507   01/29/20 1600  dolutegravir (TIVICAY) tablet 50 mg  Status:  Discontinued        50 mg Oral Daily 01/29/20 1437 01/29/20 1507   01/29/20 1415  bictegravir-emtricitabine-tenofovir AF (BIKTARVY) 50-200-25 MG per tablet 1 tablet  Status:  Discontinued        1 tablet Oral Daily 01/29/20 1408 01/29/20 1437   01/29/20 1045  amoxicillin-clavulanate (AUGMENTIN) 875-125 MG per tablet 1 tablet  Status:  Discontinued        1 tablet Oral 2 times daily 01/29/20 1041 01/29/20 1510   01/29/20 1045  bictegravir-emtricitabine-tenofovir AF (BIKTARVY) 50-200-25 MG per tablet 1 tablet  Status:  Discontinued        1 tablet Oral Daily 01/29/20 1041 01/29/20 1042   01/29/20 0913  ceFAZolin (ANCEF) 2-4 GM/100ML-% IVPB       Note to Pharmacy: Tressie Stalker   : cabinet override      01/29/20 0913 01/29/20 2114       Time spent: 20 minutes    Erin Hearing ANP  [facility] Pager [phone]. If 7PM-7AM, please  contact night-coverage at [email or web address] 03/11/2020, 11:05 AM  LOS: 42 days

## 2020-03-11 NOTE — Progress Notes (Signed)
  Speech Language Pathology Treatment: Cognitive-Linquistic;Passy Muir Speaking valve  Patient Details Name: Amber Lopez MRN: [id number] DOB: [birth year] Today's Date: 03/11/2020 Time: 1173-5670 SLP Time Calculation (min) (ACUTE ONLY): 27 min  Assessment / Plan / Recommendation Clinical Impression  Pt was seen for PMV and cognitive-linguistic treatment. PMV was placed upon arrival and she continues to tolerate it well with strong voicing. Her verbal expression continues to be fluent, but contains mostly jargon and little relevant meaning. She also continues to be highly distractible and requires mod-max multimodal cueing to attend to the task at hand. The primary task of the session targeted sustained attention. She successfully named the number and color of 5 playing cards with 2 verbal cues per card over a period of about 1 minute. Future SLP sessions should continue to address the pt's attention. Recommend pt continue to wear PMV when eating and during waking hours.   HPI HPI: Patient is a 79 y.o. female with PMH: HIV, depression, DM, HTN, HLD, arthritis, breast cancer, who was admitted on 10/7, who presented with acute onset of left hemiplegia, left facial droop, right eye deviation and aphasia. MR Brain revealed multifocal infarct in the right MCA territory affecting cortex and striatum, mild petechial hemorrhage as well as small acute left cerebellar infarct and generalized brain atrophy. She was intubated from 10/7 through 10/20 when trach was placed. PEG placed on 10/29.  Most recent CXR on 10/28 revealed persistent left base collapse/consolidation with small left pleural effusion. Patient has a Therapist, sports, changed to cuffless on 11/8, is tolerating trach collar at 8 Liters, 35 FiO2.      SLP Plan  Continue with current plan of care       Recommendations  Diet recommendations: Dysphagia 1 (puree);Thin liquid Liquids provided via: Cup;Straw Medication Administration: Via  alternative means Supervision: Full supervision/cueing for compensatory strategies;Staff to assist with self feeding Compensations: Minimize environmental distractions Postural Changes and/or Swallow Maneuvers: Seated upright 90 degrees      Patient may use Passy-Muir Speech Valve: During all waking hours (remove during sleep);During PO intake/meals PMSV Supervision: Intermittent         Oral Care Recommendations: Oral care BID Follow up Recommendations: Skilled Nursing facility SLP Visit Diagnosis: Dysphagia, unspecified (R13.10) Plan: Continue with current plan of care       GO                Amber Lopez 03/11/2020, 12:04 PM

## 2020-03-11 NOTE — Progress Notes (Signed)
Trach changed per MD order to #4 Cuffless [place] Flexible. Dr Erin Fulling happened to be at bedside at the same time and P. Kary Kos was called to conform order and Pt was capped upon Trach being changed. Positive color change noted on etco2, no blood noted. Red cap placed on pt with no complications. RN at bedside and understands plans. VS WNL. Pt remains on RA. Pt to remain capped for 24-48 hours and encourage to cough secretions unless RN or RT feels she has a mucous plug. RT will continue to monitor.

## 2020-03-11 NOTE — Progress Notes (Signed)
NAME:  Amber Lopez, MRN:  [id number], DOB:  01-Oct-1940, LOS: 2 ADMISSION DATE:  01/29/2020, CONSULTATION DATE:  10/7 REFERRING MD:  Estanislado Pandy, CHIEF COMPLAINT:  Left Sided Weakness   Brief History   79 y/o F, with HIV, admitted 10/7 with LVO of R M1 s/p tPA, neuro IR revascularization.  Returned to ICU post procedure on mechanical ventilation. Additional finding of left superior sulcus mass and complex effusion.   Past Medical History  HIV/AIDS, HTN, HLD, DM, Depression , Arthritis, Breast Cancer   Significant Hospital Events   10/07 Admit with L hemiplegia, facial droop, aphasia with right eye deviation 10/11 Episode of worsened R eye deviation with tachycardia and HTN, concern for sz  10/20 trach placed 10/28 Failed PSV due to tachypnea.   10/29 PEG placed 10/30-10/31: placed on atc tolerated well.   Consults:  Neurology, IR  Oncology 10/13 Palliative care 10/13  Procedures:  ETT 10/7 >> 10/20 L Radial ALine 10/7 >>  out R Femoral Sheath 10/7 >> out Trach 10/20 >>  Significant Diagnostic Tests:  10/7 CT Head Code Stroke >> no acute finding, generalized atrophy  10/7 CTA Head/Neck >> emergent LVO at the right M1 segment, no visible embolic source, atherosclerosis without flow limiting stenosis or ulceration of major vessels, left superior sulcus mass with large complex pleural effusion, associated mediastinal adenopathy  10/7 CT ABD w/contrast >> Negative for hematoma 10/7 CT Chest w/contrast >> Persistent large left pleural effusion with near complete left lung collapse. Left apical mass measuring 3.5 x 3.3 cm suspicious for bronchogenic carcinoma 10/11 CT head >> no acute findings 10/11 EEG >> Continuous slow, generalized and maximal right frontotemporal region  Micro Data:  COVID 10/7 >> negative  Influenza A/B 10/7 >> negative Pleural fluid culture>>no growth three days 10/9 BCx2>> 1/2 with gram positives 10/7 MRSA screen>>negative 10/11 BC x 1 (from left PICC) >  staph hominis 1/2 10/14 trach asp >> few WBC, rare G+ cocci>> few stenotrophomonas maltophilia S to levaquin and bactrim  10/14 BC >> negative  Antimicrobials:  Cefazolin 10/7 x1 Dolutegravir/ emtricitabine/ tenofovir 10/8 >> Vanc 10/14 >> 10/27 Cefepime 10/14 > 10/20  Interim history/subjective:   No distress tolerating capping well  Objective   Blood pressure 135/70, pulse 96, temperature 98.5 F (36.9 C), temperature source Axillary, resp. rate 17, height 4\' 10"  (1.473 m), weight 59.2 kg, SpO2 99 %.    FiO2 (%):  [21 %] 21 %   Intake/Output Summary (Last 24 hours) at 03/11/2020 1028 Last data filed at 03/10/2020 2209 Gross per 24 hour  Intake 80 ml  Output 1100 ml  Net -1020 ml   Filed Weights   03/07/20 0500 03/10/20 0340 03/11/20 0500  Weight: 56.8 kg 56.7 kg 59.2 kg    Examination: General 79 year old female resting in bed she is in no acute distress HEENT normocephalic atraumatic she has a size 6 flexible cuffless tracheostomy in place she is able to phonate strong, has excellent cough.  She tolerated tracheal occlusion well without any accessory use or increased work of breathing Pulmonary: Clear to auscultation Cardiac: Regular rate and rhythm Abdomen soft nontender Extremities warm dry Neuro still with left-sided neglect and left upper extremity slightly contracted   Resolved Hospital Problem list   Staph Hominis Bacteremia   Assessment & Plan:    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 tracheostomy dependence due to Inability to clear secretions & ineffective cough  Adenocarcinoma of the left lung with left pleural  effusion  Hypertension HIV/AIDS DM II  At Risk Malnutrition PEG placed 10/29 Normocytic Anemia  Interval history Has been working with speech therapySeen by [place] last on 11/15 patient is tolerating Passy-Muir valve well exhibiting strong voice still has left-sided inattention. Doing well with p.o. intake tolerating dysphagia  1 diet. Continues to work with physical therapy and Occupational Therapy making slow progress. They are recommending skilled nursing facility.  She is tolerating tracheal occlusion well, I feel like she may be a candidate for decannulation very soon  Plan/recommendation We will downsize to a size 4 flexible cuffless tracheostomy Initiate capping trials Hopefully we can decannulate in the very near future  Erick Colace ACNP-BC [place] Pager # [phone] OR # [phone] if no answer

## 2020-03-11 NOTE — Progress Notes (Signed)
Patient ID: Amber Lopez, female   DOB: 22-Aug-1940, 79 y.o.   MRN: [id number]   This NP reviewed medical records, discussed case with team and then visited patient at the bedside as a follow up for continued assessment for palliative medicine needs and emotional support to family.   Patient is now off ventilator, utilizing passy-muir valve for limited communication and progressing from a diet perspective.   Anticipated discharge to skilled nursing facility.  Recommend outpatient community-based palliative services on discharge.  Attempted to contact daughter/Chrstel unable to leave message.   PMT will continue to follow for palliative medicine needs.  Patient is high risk for decompensation  Total time spent on the unit was 15 minutes  PMT will continue to support holistically        Discussed with bedside RN  Greater than 50% of the time was spent in counseling and coordination of care  Wadie Lessen NP  Palliative Medicine Team Team Phone

## 2020-03-12 DIAGNOSIS — I63511 Cerebral infarction due to unspecified occlusion or stenosis of right middle cerebral artery: Secondary | ICD-10-CM | POA: Diagnosis not present

## 2020-03-12 DIAGNOSIS — E440 Moderate protein-calorie malnutrition: Secondary | ICD-10-CM | POA: Diagnosis not present

## 2020-03-12 DIAGNOSIS — Z930 Tracheostomy status: Secondary | ICD-10-CM | POA: Diagnosis not present

## 2020-03-12 DIAGNOSIS — C3492 Malignant neoplasm of unspecified part of left bronchus or lung: Secondary | ICD-10-CM | POA: Diagnosis not present

## 2020-03-12 LAB — GLUCOSE, CAPILLARY
Glucose-Capillary: 130 mg/dL — ABNORMAL HIGH (ref 70–99)
Glucose-Capillary: 154 mg/dL — ABNORMAL HIGH (ref 70–99)
Glucose-Capillary: 220 mg/dL — ABNORMAL HIGH (ref 70–99)
Glucose-Capillary: 126 mg/dL — ABNORMAL HIGH (ref 70–99)
Glucose-Capillary: 239 mg/dL — ABNORMAL HIGH (ref 70–99)

## 2020-03-12 MED ORDER — QUETIAPINE FUMARATE 25 MG PO TABS
12.5000 mg | ORAL_TABLET | Freq: Every day | ORAL | Status: DC
  Administered 2020-03-12 – 2020-03-21 (×10): 12.5 mg
  Filled 2020-03-12 (×10): qty 1

## 2020-03-12 NOTE — Progress Notes (Signed)
[facility] PROGRESS NOTE  Amber Lopez ERX:[id number] DOB: [birth year] DOA: 01/29/2020 PCP: Billie Ruddy, MD     11/16   Status: Remains inpatient appropriate because:Altered mental status, Unsafe d/c plan and Trach placed this admission on 10/20 and needs to be in place 30 days before can be considered for SNF placement   Dispo: The patient is from: Home              Anticipated d/c is to: SNF              Anticipated d/c date is: > 3 days              Patient currently is medically stable to d/c.   Code Status: DNR Family Communication: 11/15 spoke with daughter [name] DVT prophylaxis: Lovenox Vaccination status: Patient completed both doses of Pfizer Covid vaccine on 09/01/2019  Foley catheter: No  HPI: 79 year old female with past medical history for HIV/AIDS, hypertension, dyslipidemia, diabetes, depression, arthritis and breast cancer.  Patient presented to the ER on 10/7 with strokelike symptoms consisting of left hemiplegia, facial droop, aphasia.  CTA head neck showed emergent LVO at the right M1 segment with patient subsequently undergoing TPA and revascularization by neurology and IR.  She was admitted to the ICU postprocedure on mechanical ventilation.  Unfortunately patient's mentation and neurological status remained poor.  She was not able to follow commands and she was unable to wean from the ventilator.  Tracheostomy tube was placed on 10/20 and a PEG tube was placed on 10/29.  Unfortunately her neurological status has not improved.  Other complications since admission include a large left pleural effusion/left apical mass with pathology assistant with metastatic adenocarcinoma.  She has been denied for LTAC placement.  Palliative care has been discussing with patient family her poor prognosis and initially family requested patient be transferred to another facility but no clinical reason to transfer therefore patient remains facility.  Subjective: Examined  prior to decannulation.  Patient was tolerating capping well.  Quite talkative although had significant difficulty answering orientation questions.  She asked me what happened to cause her to have trach tube placed and seemed to understand the explanation.  Objective: Vitals:   03/12/20 0807 03/12/20 0818  BP: 117/69 117/69  Pulse: 86 86  Resp: 19 (!) 25  Temp: 98.5 F (36.9 C)   SpO2: 92% 100%   No intake or output data in the 24 hours ending 03/12/20 1118 Filed Weights   03/07/20 0500 03/10/20 0340 03/11/20 0500  Weight: 56.8 kg 56.7 kg 59.2 kg    Exam:  Constitutional: NAD, calm, comfortable Respiratory: clear to auscultation bilaterally. Normal respiratory effort. #6 cuffless trach capped and since my initial evaluation of the patient she has been decannulated by the trach team. Cardiovascular: Regular rate and rhythm, No lower extremity edema.  Adequate capillary refill Abdomen: Non tender, Bowel sounds +.PEG tube; tolerating D1 diet when fed Musculoskeletal:  LUE Pillow splint ordered for bedtime use x6 hours.  No spasticity observed with movement Neurologic: CN 2-12 grossly intact.  Sensation intact. Left hemineglect persists- left side weaker than right. Psychiatric: Alert and oriented to name and place.  H more conversant with appropriate responses to questions asked.  Speech limited to 1-2 words and is quite measured in tempo.   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 -Initially had phase of prolonged unresponsiveness but now more alert and appropriately responsive, participating  in therapies and attempting to phonate -Keeps both arms flexed with left side more at risk for contracture given left hemineglect therefore OT recommended pillow splint at bedtime -Continue SLP (swallowing and speech/cognition)-patient quite talkative now that decannulated remains  disoriented  Acute ventilatory dependent respiratory failure with hypoxemia/Inability to clear secretions/requires tracheostomy tube/COPD -Evan/19 decannulated by trach team -MBS: Tolerating D1 diet when fed-now that decannulated hopefully can progress diet-appreciate SLP assistance  Stage IV adenocarcinoma of the left lung with left pleural effusion  -CT of chest at time of admission: w/ large left pleural effusion with near complete left lung collapse. Left apical mass measuring 3.5 x 3.3 cm suspicious for bronchogenic carcinoma -Pleural fluid pathology consistent with metastatic adenocarcinoma -poor performance status precludes chemotherapy with metastatic disease; 11/15 reinforced this with patient's daughter by telephone -Patient will follow with oncology as an outpatient if improves clinically (was evaluated by Dr. Narda Rutherford during this hospitalization)  Dysphagia/ Nutrition Status: Nutrition Status: Nutrition Problem: Moderate Malnutrition Etiology: chronic illness (HIV) Signs/Symptoms: mild muscle depletion, moderate muscle depletion, mild fat depletion, moderate fat depletion Interventions: Ensure Enlive (each supplement provides 350kcal and 20 grams of protein), Tube feeding, MVI-patient also tolerating D1 diet when fed-decannulated so hopeful can progress diet soon Estimated body mass index is 27.28 kg/m as calculated from the following:   Height as of this encounter: 4\' 10"  (1.473 m).   Weight as of this encounter: 59.2 kg.  Acute diarrhea -Resolved -Continue Bentyl and prn Imodium   GOC  -Family had requested transfer to another facility when informed poor prognosis.  At this time no clinical reason to transfer to other facility.  Palliative care following intermittently -LTAC denied -Case management assisting with SNF placement  Decubitus ulcer not POA Pressure Injury 02/16/20 Perineum Right Stage 2 -  Partial thickness loss of dermis presenting as a shallow open  injury with a red, pink wound bed without slough. 2cm x 1cm pink (Active)  Date First Assessed/Time First Assessed: 02/16/20 1200   Location: Perineum  Location Orientation: Right  Staging: Stage 2 -  Partial thickness loss of dermis presenting as a shallow open injury with a red, pink wound bed without slough.  Wound Descri...    Assessments 02/16/2020 12:00 PM 03/12/2020  7:38 AM  Dressing Type Moisture barrier Other (Comment)  Dressing -- Clean;Dry;Intact  Dressing Change Frequency -- Every 3 days  State of Healing -- Epithelialized  Site / Wound Assessment Clean;Pink Pink;Red  Peri-wound Assessment Intact Intact  Wound Length (cm) 2 cm --  Wound Width (cm) 1 cm --  Wound Depth (cm) 0 cm --  Wound Surface Area (cm^2) 2 cm^2 --  Wound Volume (cm^3) 0 cm^3 --  Margins Unattached edges (unapproximated) Unattached edges (unapproximated)  Drainage Amount Scant None  Treatment Cleansed --     No Linked orders to display      Other problems: Hypertension/left ventricular diastolic dysfunction -Heart rate and blood pressure stable on Coreg. -No clinical signs consistent with heart failure  HIV/AIDS -Continue home medications including: atovaquone, dolutegravir and descovy; discussing with pharmacy and ID about transitioning to more affordable option since we are pursuing SNF care and expensive medications may be barrier to placement  Type 2 diabetes mellitus -A1c 6.5 on 10/7. -Currently stable on 15 units Levemir twice daily with sliding scale insulin.  Chronic normocytic anemia -Hemoglobin stable at 9.0    Data Reviewed: Basic Metabolic Panel: Recent Labs  Lab 03/08/20 0442 03/09/20 0818  NA 132* 133*  K 5.4*  5.1  CL 95* 95*  CO2 27 27  GLUCOSE 157* 212*  BUN 22 23  CREATININE 1.04* 1.00  CALCIUM 8.9 9.0   Liver Function Tests: Recent Labs  Lab 03/08/20 0442  AST 35  ALT 23  ALKPHOS 79  BILITOT 0.9  PROT 7.5  ALBUMIN 2.1*   No results for input(s):  LIPASE, AMYLASE in the last 168 hours. No results for input(s): AMMONIA in the last 168 hours. CBC: Recent Labs  Lab 03/08/20 0442  WBC 11.2*  HGB 9.4*  HCT 29.7*  MCV 92.2  PLT 507*   Cardiac Enzymes: No results for input(s): CKTOTAL, CKMB, CKMBINDEX, TROPONINI in the last 168 hours. BNP (last 3 results) No results for input(s): BNP in the last 8760 hours.  ProBNP (last 3 results) Recent Labs    08/07/19 1422  PROBNP 110.0*    CBG: Recent Labs  Lab 03/11/20 1551 03/11/20 2024 03/11/20 2358 03/12/20 0407 03/12/20 0813  GLUCAP 170* 178* 158* 130* 154*    No results found for this or any previous visit (from the past 240 hour(s)).   Studies: No results found.  Scheduled Meds: . aspirin  81 mg Per Tube Daily  . atovaquone  1,500 mg Per Tube Q breakfast  . carvedilol  3.125 mg Per Tube BID WC  . chlorhexidine  15 mL Mouth Rinse BID  . Chlorhexidine Gluconate Cloth  6 each Topical Daily  . dicyclomine  10 mg Per Tube TID AC & HS  . dolutegravir  50 mg Oral Daily  . emtricitabine-tenofovir AF  1 tablet Oral Daily  . enoxaparin (LOVENOX) injection  40 mg Subcutaneous Q24H  . feeding supplement  237 mL Oral BID BM  . free water  200 mL Per Tube Q8H  . influenza vaccine adjuvanted  0.5 mL Intramuscular Tomorrow-1000  . insulin aspart  0-15 Units Subcutaneous Q4H  . insulin detemir  15 Units Subcutaneous BID  . mouth rinse  15 mL Mouth Rinse q12n4p  . multivitamin  15 mL Per Tube Daily  . pantoprazole sodium  40 mg Per Tube QHS  . QUEtiapine  12.5 mg Per Tube QHS  . rosuvastatin  10 mg Per NG tube Daily   Continuous Infusions: . sodium chloride 250 mL (02/11/20 2212)  . sodium chloride Stopped (02/09/20 0556)  . feeding supplement (OSMOLITE 1.2 CAL) 1,000 mL (03/12/20 0511)    Active Problems:   DNR (do not resuscitate) discussion   Acute right MCA stroke ([place])   Middle cerebral artery embolism, right   Malnutrition of moderate degree   Adenocarcinoma of  left lung ([place])   Adenocarcinoma ([place])   Palliative care by specialist   Infiltrate noted on imaging study   Pressure injury of skin   Endotracheal tube present   Pleural effusion on left   Acute respiratory failure with hypoxia ([place])   Tracheostomy in place ([place])   Ventilator dependent ([place])   Oropharyngeal dysphagia   Consultants:  Neurology  Interventional radiology  PCCM  Oncology  Palliative care  Procedures:  Echocardiogram  EEG  Core track placement  Tracheostomy tube  PEG tube  Antibiotics: Anti-infectives (From admission, onward)   Start     Dose/Rate Route Frequency Ordered Stop   03/05/20 1000  dolutegravir (TIVICAY) tablet 50 mg        50 mg Oral Daily 03/04/20 1008     03/05/20 1000  emtricitabine-tenofovir AF (DESCOVY) 200-25 MG per tablet 1 tablet  1 tablet Oral Daily 03/04/20 1008     03/04/20 0930  atovaquone (MEPRON) 750 MG/5ML suspension 1,500 mg        1,500 mg Per Tube Daily with breakfast 03/04/20 0833     03/04/20 0800  Darunavir-Cobicisctat-Emtricitabine-Tenofovir Alafenamide (SYMTUZA) 800-150-200-10 MG TABS 1 tablet  Status:  Discontinued        1 tablet Oral Daily with breakfast 03/03/20 1345 03/04/20 1008   02/12/20 1000  vancomycin (VANCOREADY) IVPB 1250 mg/250 mL        1,250 mg 166.7 mL/hr over 90 Minutes Intravenous Every 24 hours 02/11/20 1509 02/18/20 1438   02/07/20 1310  vancomycin (VANCOREADY) IVPB 750 mg/150 mL  Status:  Discontinued        750 mg 150 mL/hr over 60 Minutes Intravenous Every 12 hours 02/07/20 1310 02/11/20 1509   02/06/20 0000  vancomycin (VANCOREADY) IVPB 500 mg/100 mL  Status:  Discontinued        500 mg 100 mL/hr over 60 Minutes Intravenous Every 12 hours 02/05/20 1141 02/07/20 1310   02/05/20 1300  ceFEPIme (MAXIPIME) 2 g in sodium chloride 0.9 % 100 mL IVPB        2 g 200 mL/hr over 30 Minutes Intravenous Every 12 hours 02/05/20 1127 02/11/20 2140   02/05/20 1300  atovaquone (MEPRON) 750 MG/5ML  suspension 1,500 mg  Status:  Discontinued        1,500 mg Per Tube Daily with breakfast 02/05/20 1137 03/03/20 1345   02/05/20 1230  vancomycin (VANCOCIN) IVPB 1000 mg/200 mL premix        1,000 mg 200 mL/hr over 60 Minutes Intravenous  Once 02/05/20 1127 02/05/20 1304   01/30/20 1000  dolutegravir (TIVICAY) tablet 50 mg  Status:  Discontinued        50 mg Oral Daily 01/30/20 0941 03/03/20 1345   01/30/20 1000  emtricitabine-tenofovir AF (DESCOVY) 200-25 MG per tablet 1 tablet  Status:  Discontinued        1 tablet Per Tube Daily 01/30/20 0941 03/03/20 1345   01/29/20 1600  emtricitabine-tenofovir AF (DESCOVY) 200-25 MG per tablet 1 tablet  Status:  Discontinued        1 tablet Per Tube Daily 01/29/20 1437 01/29/20 1507   01/29/20 1600  dolutegravir (TIVICAY) tablet 50 mg  Status:  Discontinued        50 mg Oral Daily 01/29/20 1437 01/29/20 1507   01/29/20 1415  bictegravir-emtricitabine-tenofovir AF (BIKTARVY) 50-200-25 MG per tablet 1 tablet  Status:  Discontinued        1 tablet Oral Daily 01/29/20 1408 01/29/20 1437   01/29/20 1045  amoxicillin-clavulanate (AUGMENTIN) 875-125 MG per tablet 1 tablet  Status:  Discontinued        1 tablet Oral 2 times daily 01/29/20 1041 01/29/20 1510   01/29/20 1045  bictegravir-emtricitabine-tenofovir AF (BIKTARVY) 50-200-25 MG per tablet 1 tablet  Status:  Discontinued        1 tablet Oral Daily 01/29/20 1041 01/29/20 1042   01/29/20 0913  ceFAZolin (ANCEF) 2-4 GM/100ML-% IVPB       Note to Pharmacy: Tressie Stalker   : cabinet override      01/29/20 0913 01/29/20 2114       Time spent: 20 minutes    Erin Hearing ANP  [facility] Pager [phone]. If 7PM-7AM, please contact night-coverage at [email or web address] 03/12/2020, 11:18 AM  LOS: 43 days

## 2020-03-12 NOTE — Progress Notes (Signed)
Occupational Therapy Treatment Patient Details Name: Amber Lopez MRN: [id number] DOB: 1941/04/15 Today's Date: 03/12/2020    History of present illness 79 y.o. female with a PMHx of depression, HLD, HIV, HTN and DM presenting with acute onset of left hemiplegia, left facial droop, right eye deviation and aphasia. Pt received IV tPA and underwent R MCA revacularization on 01/29/2020. Pt also found to have large left pleural effusion with near complete L lung collapse and L apical mass. Chest tube placed on 01/31/2020. s/p trach on 02/11/20.    OT comments  Pt repositioned into chair position. Increased alertness once upright. 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 Tangential and difficulty maintaining attention to task although significantly improved ability to participate in session. Pt assisted by pulling on bed rail during bed mobility while tech cleaned her after incontinent episode. Assisted with grooming and self feeding with max A. Recommend pt be placed in chair position in bed at least 3 x/day to increase level of arousal and progress with rehab. Will continue to follow acutely  Follow Up Recommendations  Supervision/Assistance - 24 hour;SNF    Equipment Recommendations  Hospital bed;Wheelchair cushion (measurements OT);Wheelchair (measurements OT);Other (comment)    Recommendations for Other Services      Precautions / Restrictions Precautions Precautions: Fall Precaution Comments: Peg Required Braces or Orthoses: Splint/Cast Splint/Cast: soft elbow splint - L; B Prevalon boots       Mobility Bed Mobility Overal bed mobility: Needs Assistance   Rolling: Max assist;+2 for physical assistance         General bed mobility comments: Pt reaching for R bed rail to help position self upright  Transfers                 General transfer comment: not attempted this date    Balance     Sitting balance-Leahy Scale: Poor                                      ADL either performed or assessed with clinical judgement   ADL   Eating/Feeding: Maximal assistance Eating/Feeding Details (indicate cue type and reason): hand over hand to feed self; spoon loaded and held in hand. After several repetitions, pt able to begin helping Grooming: Moderate assistance Grooming Details (indicate cue type and reason): washed face with cloth after initial hand over hand                     Toileting- Clothing Manipulation and Hygiene: Total assistance;Bed level         General ADL Comments: Left in "chair position"     Vision   Additional Comments: appears to have L field cut   Perception     Praxis      Cognition Arousal/Alertness: Awake/alert Behavior During Therapy: Flat affect;Restless Overall Cognitive Status: Impaired/Different from baseline Area of Impairment: Orientation;Attention;Memory;Following commands;Safety/judgement;Awareness;Problem solving                 Orientation Level: Disoriented to;Place;Time;Situation Current Attention Level: Focused Memory: Decreased recall of precautions;Decreased short-term memory Following Commands: Follows one step commands with increased time Safety/Judgement: Decreased awareness of safety;Decreased awareness of deficits Awareness: Intellectual Problem Solving: Slow processing;Decreased initiation;Difficulty sequencing;Requires verbal cues;Requires tactile cues General Comments: Thinks she is in the "locker room". Pt was a PE teacher  Exercises     Shoulder Instructions       General Comments R gaze preference however would attend at midline; talkative throughout session although tangential; talked about workign as a PE teacher    Pertinent Vitals/ Pain       Pain Assessment: Faces Faces Pain Scale: Hurts little more Pain Location: generalized with mobility, especially neck ROM Pain Descriptors / Indicators:  Grimacing;Discomfort Pain Intervention(s): Limited activity within patient's tolerance  Home Living                                          Prior Functioning/Environment              Frequency  Min 2X/week        Progress Toward Goals  OT Goals(current goals can now be found in the care plan section)  Progress towards OT goals: Progressing toward goals  Acute Rehab OT Goals Patient Stated Goal: to sit up in bed OT Goal Formulation: With patient Time For Goal Achievement: 03/19/20 Potential to Achieve Goals: Fair ADL Goals Pt Will Perform Grooming: with min assist;sitting Pt Will Perform Upper Body Bathing: with mod assist;sitting Pt Will Transfer to Toilet: with max assist;with +2 assist;bedside commode Additional ADL Goal #1: Pt will locate ADL items at midline with min cues Additional ADL Goal #2: Family will be independent with PROM/positioning Lt UE  Plan Discharge plan remains appropriate    Co-evaluation                 AM-PAC OT "6 Clicks" Daily Activity     Outcome Measure   Help from another person eating meals?: A Lot Help from another person taking care of personal grooming?: A Lot Help from another person toileting, which includes using toliet, bedpan, or urinal?: Total Help from another person bathing (including washing, rinsing, drying)?: Total Help from another person to put on and taking off regular upper body clothing?: Total Help from another person to put on and taking off regular lower body clothing?: Total 6 Click Score: 8    End of Session    OT Visit Diagnosis: Hemiplegia and hemiparesis;Other symptoms and signs involving cognitive function;Apraxia (R48.2);Muscle weakness (generalized) (M62.81);Other abnormalities of gait and mobility (R26.89);Pain Hemiplegia - Right/Left: Left Hemiplegia - dominant/non-dominant: Non-Dominant Hemiplegia - caused by: Cerebral infarction Pain - part of body:  (neck; back;  generalized)   Activity Tolerance Patient tolerated treatment well   Patient Left in bed;with call bell/phone within reach;with bed alarm set;with restraints reapplied (mittens; chair position)   Nurse Communication Mobility status        Time: 8657-8469 OT Time Calculation (min): 32 min  Charges: OT General Charges $OT Visit: 1 Visit OT Treatments $Self Care/Home Management : 23-37 mins  Maurie Boettcher, OT/L   Acute OT Clinical Specialist [place] Pager [phone] Office [phone]    [facility] 03/12/2020, 2:22 PM

## 2020-03-12 NOTE — Progress Notes (Signed)
NAME:  Amber Lopez, MRN:  [id number], DOB:  Dec 01, 1940, LOS: 44 ADMISSION DATE:  01/29/2020, CONSULTATION DATE:  10/7 REFERRING MD:  Estanislado Pandy, CHIEF COMPLAINT:  Left Sided Weakness   Brief History   79 y/o F, with HIV, admitted 10/7 with LVO of R M1 s/p tPA, neuro IR revascularization.  Returned to ICU post procedure on mechanical ventilation. Additional finding of left superior sulcus mass and complex effusion.   Past Medical History  HIV/AIDS, HTN, HLD, DM, Depression , Arthritis, Breast Cancer   Significant Hospital Events   10/07 Admit with L hemiplegia, facial droop, aphasia with right eye deviation 10/11 Episode of worsened R eye deviation with tachycardia and HTN, concern for sz  10/20 trach placed 10/28 Failed PSV due to tachypnea.   10/29 PEG placed 10/30-10/31: placed on atc tolerated well.   Consults:  Neurology, IR  Oncology 10/13 Palliative care 10/13  Procedures:  ETT 10/7 >> 10/20 L Radial ALine 10/7 >>  out R Femoral Sheath 10/7 >> out Trach 10/20 >>removed 11/19   Significant Diagnostic Tests:  10/7 CT Head Code Stroke >> no acute finding, generalized atrophy  10/7 CTA Head/Neck >> emergent LVO at the right M1 segment, no visible embolic source, atherosclerosis without flow limiting stenosis or ulceration of major vessels, left superior sulcus mass with large complex pleural effusion, associated mediastinal adenopathy  10/7 CT ABD w/contrast >> Negative for hematoma 10/7 CT Chest w/contrast >> Persistent large left pleural effusion with near complete left lung collapse. Left apical mass measuring 3.5 x 3.3 cm suspicious for bronchogenic carcinoma 10/11 CT head >> no acute findings 10/11 EEG >> Continuous slow, generalized and maximal right frontotemporal region  Micro Data:  COVID 10/7 >> negative  Influenza A/B 10/7 >> negative Pleural fluid culture>>no growth three days 10/9 BCx2>> 1/2 with gram positives 10/7 MRSA screen>>negative 10/11 BC x 1 (from  left PICC) > staph hominis 1/2 10/14 trach asp >> few WBC, rare G+ cocci>> few stenotrophomonas maltophilia S to levaquin and bactrim  10/14 BC >> negative  Antimicrobials:  Cefazolin 10/7 x1 Dolutegravir/ emtricitabine/ tenofovir 10/8 >> Vanc 10/14 >> 10/27 Cefepime 10/14 > 10/20  Interim history/subjective:   Tolerated capping   Objective   Blood pressure 117/69, pulse 86, temperature 98.5 F (36.9 C), temperature source Axillary, resp. rate (Abnormal) 25, height 4\' 10"  (1.473 m), weight 59.2 kg, SpO2 100 %.       No intake or output data in the 24 hours ending 03/12/20 1054 Filed Weights   03/07/20 0500 03/10/20 0340 03/11/20 0500  Weight: 56.8 kg 56.7 kg 59.2 kg    Examination: General this is a 79 year old female resting in bed  HENT NCAT trach now out. Stoma site w/ occlusive dressing in place Pulm clear Card RRR abd soft  Neuro awake. Excellent phonation. Left sided neglect and weakness Ext warm and dry  gu cl yellow    Resolved Hospital Problem list   Staph Hominis Bacteremia   Assessment & Plan:    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 tracheostomy dependence due to Inability to clear secretions & ineffective cough  Adenocarcinoma of the left lung with left pleural effusion  Hypertension HIV/AIDS DM II  At Risk Malnutrition PEG placed 10/29 Normocytic Anemia  Interval history Down sized to 4 and started capping trials. She tolerated this well. Her phonation quality was excellent. She was able to cough for me. She required no sxn. I decannulated her  Plan/recommendation Keep current occlusive dressing in place for 48 hrs  Then change daily w/ regular Band-Aid Encourage her to splint stoma site w/ cough      Erick Colace ACNP-BC [place] Pager # [phone] OR # [phone] if no answer

## 2020-03-12 NOTE — NC FL2 (Signed)
[place] LEVEL OF CARE SCREENING TOOL     IDENTIFICATION  [name] Birthdate: 1940-10-13 Sex: female Admission Date (Current Location): 01/29/2020  [facility] and [place] Number:  Herbalist and Address:  [facility] [place], [place] [address] [place], [place] [address]      Provider Number: [id number]  Attending Physician Name and Address:  Terrilee Croak, MD  Relative Name and Phone Number:  [name] Chui, [phone]    Current Level of Care: Hospital Recommended Level of Care: [place] Prior Approval Number:    Date Approved/Denied:   PASRR Number: [id number]  Discharge Plan: SNF    Current Diagnoses: Patient Active Problem List   Diagnosis Date Noted  . Oropharyngeal dysphagia   . Tracheostomy in place [facility])   . Ventilator dependent ([place])   . Endotracheal tube present   . Pleural effusion on left   . Acute respiratory failure with hypoxia ([place])   . Pressure injury of skin 02/07/2020  . Palliative care by specialist   . Infiltrate noted on imaging study   . Adenocarcinoma ([place])   . Malnutrition of moderate degree 02/03/2020  . Adenocarcinoma of left lung ([place])   . Acute right MCA stroke ([place]) 01/29/2020  . Middle cerebral artery embolism, right 01/29/2020  . DNR (do not resuscitate) discussion 08/12/2019  . HIV disease ([place]) 03/03/2019  . History of breast cancer 03/01/2019  . Dementia with behavioral disturbance ([place]) 03/01/2019  . Arthritis 03/01/2019  . Essential hypertension 03/01/2019    Orientation RESPIRATION BLADDER Height & Weight     Self (Patient currently trached, mouthing responses but orientation is unclear)  Normal Incontinent Weight: 130 lb 8.2 oz (59.2 kg) Height:  4\' 10"  (147.3 cm)  BEHAVIORAL SYMPTOMS/MOOD NEUROLOGICAL BOWEL NUTRITION STATUS      Incontinent Diet (See discharge summary)  AMBULATORY STATUS COMMUNICATION OF NEEDS Skin   Extensive Assist  Verbally PU Stage and Appropriate Care (RN states some skin breakdown near trach site)   PU Stage 2 Dressing:  (perineum, foam dressing, change every 3 days)                   Personal Care Assistance Level of Assistance  Bathing, Feeding, Dressing Bathing Assistance: Maximum assistance Feeding assistance: Maximum assistance (Currently has feeding tube) Dressing Assistance: Maximum assistance     Functional Limitations Info  Sight, Hearing, Speech Sight Info: Adequate Hearing Info: Adequate Speech Info: Impaired    SPECIAL CARE FACTORS FREQUENCY  PT (By licensed PT), OT (By licensed OT), Speech therapy     PT Frequency: 5x weekly OT Frequency: 5x weekly     Speech Therapy Frequency: 5x/wk      Contractures Contractures Info: Not present    Additional Factors Info  Allergies, Code Status Code Status Info: DNR Allergies Info: Sulfa Antibiotics           Current Medications (03/12/2020):  This is the current hospital active medication list Current Facility-Administered Medications  Medication Dose Route Frequency Provider Last Rate Last Admin  . 0.9 %  sodium chloride infusion  250 mL Intravenous Continuous Ollis, Brandi L, NP 10 mL/hr at 02/11/20 2212 250 mL at 02/11/20 2212  . 0.9 %  sodium chloride infusion   Intra-arterial PRN Gleason, Otilio Carpen, PA-C   Paused at 02/09/20 0556  . acetaminophen (TYLENOL) tablet 650 mg  650 mg Oral Q4H PRN Kerney Elbe, MD   650 mg at 03/09/20 0830  Or  . acetaminophen (TYLENOL) 160 MG/5ML solution 650 mg  650 mg Per Tube Q4H PRN Kerney Elbe, MD   650 mg at 02/23/20 1733   Or  . acetaminophen (TYLENOL) suppository 650 mg  650 mg Rectal Q4H PRN Kerney Elbe, MD      . albuterol (PROVENTIL) (2.5 MG/3ML) 0.083% nebulizer solution 2.5 mg  2.5 mg Nebulization Q4H PRN Dahal, Binaya, MD      . aspirin chewable tablet 81 mg  81 mg Per Tube Daily Rosalin Hawking, MD   81 mg at 03/12/20 0906  . atovaquone (MEPRON) 750 MG/5ML suspension  1,500 mg  1,500 mg Per Tube Q breakfast Pham, Minh Q, RPH-CPP   1,500 mg at 03/12/20 0907  . carvedilol (COREG) tablet 3.125 mg  3.125 mg Per Tube BID WC Hammons, Kimberly B, RPH   3.125 mg at 03/12/20 0906  . chlorhexidine (PERIDEX) 0.12 % solution 15 mL  15 mL Mouth Rinse BID Audria Nine, DO   15 mL at 03/12/20 0905  . Chlorhexidine Gluconate Cloth 2 % PADS 6 each  6 each Topical Daily Kerney Elbe, MD   6 each at 03/12/20 [phone]  . dicyclomine (BENTYL) 10 MG/5ML solution 10 mg  10 mg Per Tube TID AC & HS Hammons, Kimberly B, RPH   10 mg at 03/12/20 0906  . dolutegravir (TIVICAY) tablet 50 mg  50 mg Oral Daily Samella Parr, NP   50 mg at 03/12/20 0906  . emtricitabine-tenofovir AF (DESCOVY) 200-25 MG per tablet 1 tablet  1 tablet Oral Daily Samella Parr, NP   1 tablet at 03/12/20 0905  . enoxaparin (LOVENOX) injection 40 mg  40 mg Subcutaneous Q24H Simonne Maffucci B, MD   40 mg at 03/12/20 0904  . feeding supplement (ENSURE ENLIVE / ENSURE PLUS) liquid 237 mL  237 mL Oral BID BM Dahal, Marlowe Aschoff, MD   237 mL at 03/12/20 0907  . feeding supplement (OSMOLITE 1.2 CAL) liquid 1,000 mL  1,000 mL Per Tube Continuous Dahal, Marlowe Aschoff, MD 60 mL/hr at 03/12/20 0511 1,000 mL at 03/12/20 0511  . free water 200 mL  200 mL Per Tube Q8H Samella Parr, NP   200 mL at 03/12/20 0614  . influenza vaccine adjuvanted (FLUAD) injection 0.5 mL  0.5 mL Intramuscular Tomorrow-1000 Sampson Goon, MD      . insulin aspart (novoLOG) injection 0-15 Units  0-15 Units Subcutaneous Q4H Gleason, Otilio Carpen, PA-C   3 Units at 03/12/20 0910  . insulin detemir (LEVEMIR) injection 15 Units  15 Units Subcutaneous BID Jennelle Human B, NP   15 Units at 03/12/20 0906  . labetalol (NORMODYNE) injection 5 mg  5 mg Intravenous Q2H PRN Sampson Goon, MD      . loperamide HCl (IMODIUM) 1 MG/7.5ML suspension 2 mg  2 mg Per Tube PRN Samella Parr, NP      . MEDLINE mouth rinse  15 mL Mouth Rinse q12n4p Audria Nine, DO   15  mL at 03/11/20 1407  . multivitamin liquid 15 mL  15 mL Per Tube Daily Hammons, Kimberly B, RPH   15 mL at 03/12/20 0905  . pantoprazole sodium (PROTONIX) 40 mg/20 mL oral suspension 40 mg  40 mg Per Tube QHS Garvin Fila, MD   40 mg at 03/11/20 2133  . QUEtiapine (SEROQUEL) tablet 12.5 mg  12.5 mg Per Tube QHS Samella Parr, NP      . rosuvastatin (CRESTOR) tablet 10 mg  10 mg Per NG tube Daily Sampson Goon, MD   10 mg at 03/12/20 1610     Discharge Medications: Please see discharge summary for a list of discharge medications.  Relevant Imaging Results:  Relevant Lab Results:   Additional Information SSN: [id number]  Geralynn Ochs, LCSW

## 2020-03-12 NOTE — TOC Progression Note (Signed)
Transition of Care [facility]) - Progression Note    Patient Details  Name: Amber Lopez MRN: [id number] Date of Birth: Sep 05, 1940  Transition of Care [facility]) CM/SW [place], [place] Phone Number: 03/12/2020, 12:29 PM  Clinical Narrative:   CSW alerted by medical team that patient has been decannulated this morning. CSW updated FL2 and faxed out referral again for bed offers now that patient does not have trach. CSW to follow.    Expected Discharge Plan: [place] Barriers to Discharge: Continued Medical Work up, SNF Pending bed offer  Expected Discharge Plan and Services Expected Discharge Plan: [place] In-house Referral: Clinical Social Work Discharge Planning Services: CM Consult Post Acute Care Choice: [place] arrangements for the past 2 months: Single Family Home                                       Social Determinants of Health (SDOH) Interventions    Readmission Risk Interventions No flowsheet data found.

## 2020-03-13 DIAGNOSIS — J9601 Acute respiratory failure with hypoxia: Secondary | ICD-10-CM | POA: Diagnosis not present

## 2020-03-13 LAB — GLUCOSE, CAPILLARY
Glucose-Capillary: 147 mg/dL — ABNORMAL HIGH (ref 70–99)
Glucose-Capillary: 189 mg/dL — ABNORMAL HIGH (ref 70–99)
Glucose-Capillary: 202 mg/dL — ABNORMAL HIGH (ref 70–99)
Glucose-Capillary: 208 mg/dL — ABNORMAL HIGH (ref 70–99)
Glucose-Capillary: 217 mg/dL — ABNORMAL HIGH (ref 70–99)
Glucose-Capillary: 264 mg/dL — ABNORMAL HIGH (ref 70–99)

## 2020-03-13 MED ORDER — DOLUTEGRAVIR SODIUM 50 MG PO TABS
50.0000 mg | ORAL_TABLET | Freq: Every day | ORAL | Status: DC
  Administered 2020-03-13 – 2020-03-22 (×10): 50 mg
  Filled 2020-03-13 (×10): qty 1

## 2020-03-13 NOTE — Progress Notes (Signed)
PROGRESS NOTE  Amber Lopez  DOB: 1940-07-29  PCP: Billie Ruddy, MD [phone]  DOA: 01/29/2020  LOS: 44 days   Chief complaint: Strokelike symptoms  Brief narrative: Amber Lopez is a 79 y.o. female who presented to the ED on 01/29/2020 with left hemiplegia, facial droop, aphasia with right eye deviation. PMH significant for HIV/AIDS, HTN, HLD, DM, Depression, Arthritis, Breast Cancer.  CTA head and neck showed emergent LVO at the right M1 segment for which patient underwent TPA and revascularization by neuro IR. She was admitted to ICU post procedure on mechanical ventilation.  Over the next several days, patient's neurological status continued to remain poor. She was not able to follow commands.  She was not able to be weaned off ventilator. 10/20 trach placed 10/29 PEG placed 11/2, transferred out to hospitalist service  11/19, tracheostomy decannulated by trach team. Pending placement.  Subjective: Patient was seen and examined this morning. Breathing on room air.  Trach tube was decannulated yesterday. Unable to follow commands.  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 -Patient has left-sided hemineglect and left hemiparesis.  Physical therapy involved. -Unresponsiveness is improved but he still unable to follow commands. -Continue PEG tube feeding as a less dysphagia 1 diet.. -Continue secondary stroke management, [place], crestor  Acute ventilatory dependent respiratory failure with hypoxemia Inability to clear secretions -Tracheostomy tube decannulated on 11/19. -Currently room air.  Adenocarcinoma of the left lung with left pleural effusion  -CT of chest on admission showed persistent large left pleural effusion with near complete left lung collapse. Left apical mass measuring 3.5 x 3.3 cm suspicious for bronchogenic carcinoma -Pleural fluid pathology consistent with metastatic  adenocarcinoma. -poor performance status precludes chemotherapy with metastatic disease  -Patient will follow with oncology as an outpatient if clinically improved. (was evaluated by Dr. Narda Rutherford during this hospitalization)  Hypertension -Heart rate and blood pressure stable on Coreg.  HIV/AIDS -Continue home medications including: atovaquone, dolutegravir and descovy  Type 2 diabetes mellitus -A1c 6.5 on 10/7. -Currently on 15 units Levemir twice daily with sliding scale insulin. Recent Labs  Lab 03/12/20 1612 03/12/20 2017 03/13/20 0052 03/13/20 0429 03/13/20 0746  GLUCAP 126* 239* 147* 202* 189*   Chronic normocytic anemia -Stable.  No current evidence of bleeding Recent Labs    02/18/20 0335 02/18/20 0335 02/24/20 0654 02/24/20 0654 02/29/20 0412 02/29/20 0412 03/02/20 0313 03/08/20 0442  HGB 8.1*  --  9.9*  --  9.4*  --  9.0* 9.4*  MCV 94.5   < > 95.0   < > 94.4   < > 93.6 92.2   < > = values in this interval not displayed.   GOC  -Family had requested transfer to another facility when informed poor prognosis.  At this time no clinical reason to transfer to other facility.  Palliative care consult appreciated. -LTAC denied -Case management working to find up SNF  Mobility: PT involved Code Status:   Code Status: DNR  Nutritional status: Body mass index is 27.28 kg/m. Nutrition Problem: Moderate Malnutrition Etiology: chronic illness (HIV) Signs/Symptoms: mild muscle depletion, moderate muscle depletion, mild fat depletion, moderate fat depletion Diet Order            DIET - DYS 1 Room service appropriate? No; Fluid consistency: Thin  Diet effective now                DVT prophylaxis: enoxaparin (LOVENOX) injection 40 mg Start: 02/03/20  1000   Antimicrobials:  Completed the course of antibiotics Fluid: None Consultants: Neurology, IR, critical care, oncology, Palliative care Family Communication:  None at bedside  Status is:  Inpatient  Remains inpatient appropriate because:Unsafe d/c plan   Dispo: The patient is from: Home              Anticipated d/c is to: SNF               Anticipated d/c date is: Whenever bed is available              Patient currently is medically stable for discharge  Infusions:  . sodium chloride 250 mL (02/11/20 2212)  . sodium chloride Stopped (02/09/20 0556)  . feeding supplement (OSMOLITE 1.2 CAL) 1,000 mL (03/12/20 0511)    Scheduled Meds: . aspirin  81 mg Per Tube Daily  . atovaquone  1,500 mg Per Tube Q breakfast  . carvedilol  3.125 mg Per Tube BID WC  . chlorhexidine  15 mL Mouth Rinse BID  . Chlorhexidine Gluconate Cloth  6 each Topical Daily  . dicyclomine  10 mg Per Tube TID AC & HS  . dolutegravir  50 mg Per Tube Daily  . emtricitabine-tenofovir AF  1 tablet Oral Daily  . enoxaparin (LOVENOX) injection  40 mg Subcutaneous Q24H  . feeding supplement  237 mL Oral BID BM  . free water  200 mL Per Tube Q8H  . influenza vaccine adjuvanted  0.5 mL Intramuscular Tomorrow-1000  . insulin aspart  0-15 Units Subcutaneous Q4H  . insulin detemir  15 Units Subcutaneous BID  . mouth rinse  15 mL Mouth Rinse q12n4p  . multivitamin  15 mL Per Tube Daily  . pantoprazole sodium  40 mg Per Tube QHS  . QUEtiapine  12.5 mg Per Tube QHS  . rosuvastatin  10 mg Per NG tube Daily    Antimicrobials: Anti-infectives (From admission, onward)   Start     Dose/Rate Route Frequency Ordered Stop   03/13/20 1000  dolutegravir (TIVICAY) tablet 50 mg        50 mg Per Tube Daily 03/13/20 0712     03/05/20 1000  dolutegravir (TIVICAY) tablet 50 mg  Status:  Discontinued        50 mg Oral Daily 03/04/20 1008 03/13/20 0712   03/05/20 1000  emtricitabine-tenofovir AF (DESCOVY) 200-25 MG per tablet 1 tablet        1 tablet Oral Daily 03/04/20 1008     03/04/20 0930  atovaquone (MEPRON) 750 MG/5ML suspension 1,500 mg        1,500 mg Per Tube Daily with breakfast 03/04/20 0833     03/04/20 0800   Darunavir-Cobicisctat-Emtricitabine-Tenofovir Alafenamide (SYMTUZA) 800-150-200-10 MG TABS 1 tablet  Status:  Discontinued        1 tablet Oral Daily with breakfast 03/03/20 1345 03/04/20 1008   02/12/20 1000  vancomycin (VANCOREADY) IVPB 1250 mg/250 mL        1,250 mg 166.7 mL/hr over 90 Minutes Intravenous Every 24 hours 02/11/20 1509 02/18/20 1438   02/07/20 1310  vancomycin (VANCOREADY) IVPB 750 mg/150 mL  Status:  Discontinued        750 mg 150 mL/hr over 60 Minutes Intravenous Every 12 hours 02/07/20 1310 02/11/20 1509   02/06/20 0000  vancomycin (VANCOREADY) IVPB 500 mg/100 mL  Status:  Discontinued        500 mg 100 mL/hr over 60 Minutes Intravenous Every 12 hours 02/05/20 1141 02/07/20 1310  02/05/20 1300  ceFEPIme (MAXIPIME) 2 g in sodium chloride 0.9 % 100 mL IVPB        2 g 200 mL/hr over 30 Minutes Intravenous Every 12 hours 02/05/20 1127 02/11/20 2140   02/05/20 1300  atovaquone (MEPRON) 750 MG/5ML suspension 1,500 mg  Status:  Discontinued        1,500 mg Per Tube Daily with breakfast 02/05/20 1137 03/03/20 1345   02/05/20 1230  vancomycin (VANCOCIN) IVPB 1000 mg/200 mL premix        1,000 mg 200 mL/hr over 60 Minutes Intravenous  Once 02/05/20 1127 02/05/20 1304   01/30/20 1000  dolutegravir (TIVICAY) tablet 50 mg  Status:  Discontinued        50 mg Oral Daily 01/30/20 0941 03/03/20 1345   01/30/20 1000  emtricitabine-tenofovir AF (DESCOVY) 200-25 MG per tablet 1 tablet  Status:  Discontinued        1 tablet Per Tube Daily 01/30/20 0941 03/03/20 1345   01/29/20 1600  emtricitabine-tenofovir AF (DESCOVY) 200-25 MG per tablet 1 tablet  Status:  Discontinued        1 tablet Per Tube Daily 01/29/20 1437 01/29/20 1507   01/29/20 1600  dolutegravir (TIVICAY) tablet 50 mg  Status:  Discontinued        50 mg Oral Daily 01/29/20 1437 01/29/20 1507   01/29/20 1415  bictegravir-emtricitabine-tenofovir AF (BIKTARVY) 50-200-25 MG per tablet 1 tablet  Status:  Discontinued        1  tablet Oral Daily 01/29/20 1408 01/29/20 1437   01/29/20 1045  amoxicillin-clavulanate (AUGMENTIN) 875-125 MG per tablet 1 tablet  Status:  Discontinued        1 tablet Oral 2 times daily 01/29/20 1041 01/29/20 1510   01/29/20 1045  bictegravir-emtricitabine-tenofovir AF (BIKTARVY) 50-200-25 MG per tablet 1 tablet  Status:  Discontinued        1 tablet Oral Daily 01/29/20 1041 01/29/20 1042   01/29/20 0913  ceFAZolin (ANCEF) 2-4 GM/100ML-% IVPB       Note to Pharmacy: Tressie Stalker   : cabinet override      01/29/20 0913 01/29/20 2114      PRN meds: Place/Maintain arterial line **AND** sodium chloride, acetaminophen **OR** acetaminophen (TYLENOL) oral liquid 160 mg/5 mL **OR** acetaminophen, albuterol, labetalol, loperamide HCl   Objective: Vitals:   03/13/20 0408 03/13/20 0748  BP: (!) 124/114   Pulse: 90 86  Resp: 18   Temp: 98.6 F (37 C) 98.1 F (36.7 C)  SpO2: 100%     Intake/Output Summary (Last 24 hours) at 03/13/2020 1029 Last data filed at 03/13/2020 0510 Gross per 24 hour  Intake 190 ml  Output 800 ml  Net -610 ml   Filed Weights   03/07/20 0500 03/10/20 0340 03/11/20 0500  Weight: 56.8 kg 56.7 kg 59.2 kg   Weight change:  Body mass index is 27.28 kg/m.   Physical Exam: General exam: PEG status. Not in physical distress  Skin: No rashes, lesions or ulcers. HEENT: Bandaged at previous trach site. Lungs: Clear to auscultation bilaterally CVS: Regular rate and rhythm, no murmur GI/Abd soft, nontender, nondistended, PEG tube site intact CNS: Alert, awake, dense hemiplegia on the left.  Unable to follow commands though.   Psychiatry: Mood appropriate Extremities: No pedal edema, no calf tenderness  Data Review: I have personally reviewed the laboratory data and studies available.  Recent Labs  Lab 03/08/20 0442  WBC 11.2*  HGB 9.4*  HCT 29.7*  MCV 92.2  PLT  507*   Recent Labs  Lab 03/08/20 0442 03/09/20 0818  NA 132* 133*  K 5.4* 5.1  CL 95*  95*  CO2 27 27  GLUCOSE 157* 212*  BUN 22 23  CREATININE 1.04* 1.00  CALCIUM 8.9 9.0    F/u labs ordered.  Signed, Terrilee Croak, MD [facility] 03/13/2020

## 2020-03-14 DIAGNOSIS — I63511 Cerebral infarction due to unspecified occlusion or stenosis of right middle cerebral artery: Secondary | ICD-10-CM | POA: Diagnosis not present

## 2020-03-14 LAB — GLUCOSE, CAPILLARY
Glucose-Capillary: 251 mg/dL — ABNORMAL HIGH (ref 70–99)
Glucose-Capillary: 223 mg/dL — ABNORMAL HIGH (ref 70–99)
Glucose-Capillary: 182 mg/dL — ABNORMAL HIGH (ref 70–99)
Glucose-Capillary: 182 mg/dL — ABNORMAL HIGH (ref 70–99)
Glucose-Capillary: 144 mg/dL — ABNORMAL HIGH (ref 70–99)
Glucose-Capillary: 152 mg/dL — ABNORMAL HIGH (ref 70–99)

## 2020-03-14 MED ORDER — INSULIN DETEMIR 100 UNIT/ML ~~LOC~~ SOLN
18.0000 [IU] | Freq: Two times a day (BID) | SUBCUTANEOUS | Status: DC
  Administered 2020-03-14 – 2020-03-19 (×11): 18 [IU] via SUBCUTANEOUS
  Filled 2020-03-14 (×12): qty 0.18

## 2020-03-14 NOTE — Progress Notes (Signed)
PROGRESS NOTE  Amber Lopez  DOB: 02/07/1941  PCP: Billie Ruddy, MD [phone]  DOA: 01/29/2020  LOS: 45 days   Chief complaint: Strokelike symptoms  Brief narrative: Amber Lopez is a 79 y.o. female who presented to the ED on 01/29/2020 with left hemiplegia, facial droop, aphasia with right eye deviation. PMH significant for HIV/AIDS, HTN, HLD, DM, Depression, Arthritis, Breast Cancer.  CTA head and neck showed emergent LVO at the right M1 segment for which patient underwent TPA and revascularization by neuro IR. She was admitted to ICU post procedure on mechanical ventilation.  Over the next several days, patient's neurological status continued to remain poor. She was not able to follow commands.  She was not able to be weaned off ventilator. 10/20 trach placed 10/29 PEG placed 11/2, transferred out to hospitalist service  11/19, tracheostomy decannulated by trach team. Pending placement.  Subjective: Patient was seen and examined this morning. Breathing on room air.   Able to have some conversation today.  Plan to move her extremities on command but very weak.  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 -Patient has left-sided hemineglect and left hemiparesis.  Physical therapy involved. -Unresponsiveness is improving.  Speech seems much better.  Generalized weakness persist but trying to follow command. -Continue PEG tube feeding as a less dysphagia 1 diet.. -Continue secondary stroke management, Asa, crestor  Acute ventilatory dependent respiratory failure with hypoxemia Inability to clear secretions -Tracheostomy tube decannulated on 11/19. -Currently room air.  Adenocarcinoma of the left lung with left pleural effusion  -CT of chest on admission showed persistent large left pleural effusion with near complete left lung collapse. Left apical mass measuring 3.5 x 3.3 cm suspicious for  bronchogenic carcinoma -Pleural fluid pathology consistent with metastatic adenocarcinoma. -poor performance status precludes chemotherapy with metastatic disease  -Patient will follow with oncology as an outpatient if clinically improved. (was evaluated by Dr. Narda Rutherford during this hospitalization)  Hypertension -Heart rate and blood pressure stable on Coreg.  HIV/AIDS -Continue home medications including: atovaquone, dolutegravir and descovy  Type 2 diabetes mellitus -A1c 6.5 on 10/7. -Currently on 15 units Levemir twice daily with sliding scale insulin. -Blood sugar level rising up.  Increase Levemir to 18 units twice daily. Recent Labs  Lab 03/13/20 1546 03/13/20 1955 03/14/20 0152 03/14/20 0444 03/14/20 0822  GLUCAP 217* 208* 251* 223* 182*   Chronic normocytic anemia -Stable.  No current evidence of bleeding Recent Labs    02/18/20 0335 02/18/20 0335 02/24/20 0654 02/24/20 0654 02/29/20 0412 02/29/20 0412 03/02/20 0313 03/08/20 0442  HGB 8.1*  --  9.9*  --  9.4*  --  9.0* 9.4*  MCV 94.5   < > 95.0   < > 94.4   < > 93.6 92.2   < > = values in this interval not displayed.   GOC  -Family had requested transfer to another facility when informed poor prognosis.  At this time no clinical reason to transfer to other facility.  Palliative care consult appreciated. -LTAC denied -Case management working to find up SNF  Mobility: PT involved Code Status:   Code Status: DNR  Nutritional status: Body mass index is 27.28 kg/m. Nutrition Problem: Moderate Malnutrition Etiology: chronic illness (HIV) Signs/Symptoms: mild muscle depletion, moderate muscle depletion, mild fat depletion, moderate fat depletion Diet Order            DIET - DYS 1 Room service appropriate? No; Fluid consistency:  Thin  Diet effective now                DVT prophylaxis: enoxaparin (LOVENOX) injection 40 mg Start: 02/03/20 1000   Antimicrobials:  Completed the course of  antibiotics Fluid: None Consultants: Neurology, IR, critical care, oncology, Palliative care Family Communication:  None at bedside  Status is: Inpatient  Remains inpatient appropriate because:Unsafe d/c plan   Dispo: The patient is from: Home              Anticipated d/c is to: SNF               Anticipated d/c date is: Whenever bed is available              Patient currently is medically stable for discharge  Infusions:  . sodium chloride 250 mL (02/11/20 2212)  . sodium chloride Stopped (02/09/20 0556)  . feeding supplement (OSMOLITE 1.2 CAL) 1,000 mL (03/14/20 1001)    Scheduled Meds: . aspirin  81 mg Per Tube Daily  . atovaquone  1,500 mg Per Tube Q breakfast  . carvedilol  3.125 mg Per Tube BID WC  . chlorhexidine  15 mL Mouth Rinse BID  . Chlorhexidine Gluconate Cloth  6 each Topical Daily  . dicyclomine  10 mg Per Tube TID AC & HS  . dolutegravir  50 mg Per Tube Daily  . emtricitabine-tenofovir AF  1 tablet Oral Daily  . enoxaparin (LOVENOX) injection  40 mg Subcutaneous Q24H  . feeding supplement  237 mL Oral BID BM  . free water  200 mL Per Tube Q8H  . influenza vaccine adjuvanted  0.5 mL Intramuscular Tomorrow-1000  . insulin aspart  0-15 Units Subcutaneous Q4H  . insulin detemir  18 Units Subcutaneous BID  . mouth rinse  15 mL Mouth Rinse q12n4p  . multivitamin  15 mL Per Tube Daily  . pantoprazole sodium  40 mg Per Tube QHS  . QUEtiapine  12.5 mg Per Tube QHS  . rosuvastatin  10 mg Per NG tube Daily    Antimicrobials: Anti-infectives (From admission, onward)   Start     Dose/Rate Route Frequency Ordered Stop   03/13/20 1000  dolutegravir (TIVICAY) tablet 50 mg        50 mg Per Tube Daily 03/13/20 0712     03/05/20 1000  dolutegravir (TIVICAY) tablet 50 mg  Status:  Discontinued        50 mg Oral Daily 03/04/20 1008 03/13/20 0712   03/05/20 1000  emtricitabine-tenofovir AF (DESCOVY) 200-25 MG per tablet 1 tablet        1 tablet Oral Daily 03/04/20 1008      03/04/20 0930  atovaquone (MEPRON) 750 MG/5ML suspension 1,500 mg        1,500 mg Per Tube Daily with breakfast 03/04/20 0833     03/04/20 0800  Darunavir-Cobicisctat-Emtricitabine-Tenofovir Alafenamide (SYMTUZA) 800-150-200-10 MG TABS 1 tablet  Status:  Discontinued        1 tablet Oral Daily with breakfast 03/03/20 1345 03/04/20 1008   02/12/20 1000  vancomycin (VANCOREADY) IVPB 1250 mg/250 mL        1,250 mg 166.7 mL/hr over 90 Minutes Intravenous Every 24 hours 02/11/20 1509 02/18/20 1438   02/07/20 1310  vancomycin (VANCOREADY) IVPB 750 mg/150 mL  Status:  Discontinued        750 mg 150 mL/hr over 60 Minutes Intravenous Every 12 hours 02/07/20 1310 02/11/20 1509   02/06/20 0000  vancomycin (VANCOREADY) IVPB 500 mg/100  mL  Status:  Discontinued        500 mg 100 mL/hr over 60 Minutes Intravenous Every 12 hours 02/05/20 1141 02/07/20 1310   02/05/20 1300  ceFEPIme (MAXIPIME) 2 g in sodium chloride 0.9 % 100 mL IVPB        2 g 200 mL/hr over 30 Minutes Intravenous Every 12 hours 02/05/20 1127 02/11/20 2140   02/05/20 1300  atovaquone (MEPRON) 750 MG/5ML suspension 1,500 mg  Status:  Discontinued        1,500 mg Per Tube Daily with breakfast 02/05/20 1137 03/03/20 1345   02/05/20 1230  vancomycin (VANCOCIN) IVPB 1000 mg/200 mL premix        1,000 mg 200 mL/hr over 60 Minutes Intravenous  Once 02/05/20 1127 02/05/20 1304   01/30/20 1000  dolutegravir (TIVICAY) tablet 50 mg  Status:  Discontinued        50 mg Oral Daily 01/30/20 0941 03/03/20 1345   01/30/20 1000  emtricitabine-tenofovir AF (DESCOVY) 200-25 MG per tablet 1 tablet  Status:  Discontinued        1 tablet Per Tube Daily 01/30/20 0941 03/03/20 1345   01/29/20 1600  emtricitabine-tenofovir AF (DESCOVY) 200-25 MG per tablet 1 tablet  Status:  Discontinued        1 tablet Per Tube Daily 01/29/20 1437 01/29/20 1507   01/29/20 1600  dolutegravir (TIVICAY) tablet 50 mg  Status:  Discontinued        50 mg Oral Daily 01/29/20 1437  01/29/20 1507   01/29/20 1415  bictegravir-emtricitabine-tenofovir AF (BIKTARVY) 50-200-25 MG per tablet 1 tablet  Status:  Discontinued        1 tablet Oral Daily 01/29/20 1408 01/29/20 1437   01/29/20 1045  amoxicillin-clavulanate (AUGMENTIN) 875-125 MG per tablet 1 tablet  Status:  Discontinued        1 tablet Oral 2 times daily 01/29/20 1041 01/29/20 1510   01/29/20 1045  bictegravir-emtricitabine-tenofovir AF (BIKTARVY) 50-200-25 MG per tablet 1 tablet  Status:  Discontinued        1 tablet Oral Daily 01/29/20 1041 01/29/20 1042   01/29/20 0913  ceFAZolin (ANCEF) 2-4 GM/100ML-% IVPB       Note to Pharmacy: Tressie Stalker   : cabinet override      01/29/20 0913 01/29/20 2114      PRN meds: Place/Maintain arterial line **AND** sodium chloride, acetaminophen **OR** acetaminophen (TYLENOL) oral liquid 160 mg/5 mL **OR** acetaminophen, albuterol, labetalol, loperamide HCl   Objective: Vitals:   03/14/20 0409 03/14/20 0834  BP: (!) 139/56 (!) 147/54  Pulse: 82 83  Resp: 18 18  Temp: 98.1 F (36.7 C) 98.6 F (37 C)  SpO2: 96%     Intake/Output Summary (Last 24 hours) at 03/14/2020 1100 Last data filed at 03/14/2020 0847 Gross per 24 hour  Intake 935 ml  Output 950 ml  Net -15 ml   Filed Weights   03/07/20 0500 03/10/20 0340 03/11/20 0500  Weight: 56.8 kg 56.7 kg 59.2 kg   Weight change:  Body mass index is 27.28 kg/m.   Physical Exam: General exam: PEG status. Not in physical distress  Skin: No rashes, lesions or ulcers. HEENT: Bandaged at previous trach site. Lungs: Clear to auscultation bilaterally CVS: Regular rate and rhythm, no murmur GI/Abd soft, nontender, nondistended, PEG tube site intact CNS: Alert, awake, able to tell me her name.  Speech strong.  Generalized weakness but I see 2/5 strength in all 4 extremities. Psychiatry: Mood appropriate Extremities: No pedal  edema, no calf tenderness  Data Review: I have personally reviewed the laboratory data and  studies available.  Recent Labs  Lab 03/08/20 0442  WBC 11.2*  HGB 9.4*  HCT 29.7*  MCV 92.2  PLT 507*   Recent Labs  Lab 03/08/20 0442 03/09/20 0818  NA 132* 133*  K 5.4* 5.1  CL 95* 95*  CO2 27 27  GLUCOSE 157* 212*  BUN 22 23  CREATININE 1.04* 1.00  CALCIUM 8.9 9.0    F/u labs ordered.  Signed, Terrilee Croak, MD [facility] 03/14/2020

## 2020-03-14 NOTE — Progress Notes (Signed)
CBGs have been running persistently >180. Ok to increase Levemir to 18 units BID per Dr. Pietro Cassis.  Onnie Boer, PharmD, BCIDP, AAHIVP, CPP Infectious Disease Pharmacist 03/14/2020 7:20 AM

## 2020-03-15 DIAGNOSIS — I63511 Cerebral infarction due to unspecified occlusion or stenosis of right middle cerebral artery: Secondary | ICD-10-CM | POA: Diagnosis not present

## 2020-03-15 DIAGNOSIS — E440 Moderate protein-calorie malnutrition: Secondary | ICD-10-CM | POA: Diagnosis not present

## 2020-03-15 DIAGNOSIS — R1312 Dysphagia, oropharyngeal phase: Secondary | ICD-10-CM | POA: Diagnosis not present

## 2020-03-15 DIAGNOSIS — C3492 Malignant neoplasm of unspecified part of left bronchus or lung: Secondary | ICD-10-CM | POA: Diagnosis not present

## 2020-03-15 LAB — GLUCOSE, CAPILLARY
Glucose-Capillary: 161 mg/dL — ABNORMAL HIGH (ref 70–99)
Glucose-Capillary: 188 mg/dL — ABNORMAL HIGH (ref 70–99)
Glucose-Capillary: 191 mg/dL — ABNORMAL HIGH (ref 70–99)
Glucose-Capillary: 206 mg/dL — ABNORMAL HIGH (ref 70–99)
Glucose-Capillary: 229 mg/dL — ABNORMAL HIGH (ref 70–99)
Glucose-Capillary: 232 mg/dL — ABNORMAL HIGH (ref 70–99)
Glucose-Capillary: 262 mg/dL — ABNORMAL HIGH (ref 70–99)

## 2020-03-15 MED ORDER — EMTRICITABINE-TENOFOVIR AF 200-25 MG PO TABS
1.0000 | ORAL_TABLET | Freq: Every day | ORAL | Status: DC
  Administered 2020-03-15 – 2020-03-22 (×8): 1
  Filled 2020-03-15 (×8): qty 1

## 2020-03-15 NOTE — Progress Notes (Signed)
Physical Therapy Treatment Patient Details Name: Amber Lopez MRN: [id number] DOB: Oct 16, 1940 Today's Date: 03/15/2020    History of Present Illness 79 y.o. female with a PMHx of depression, HLD, HIV, HTN and DM presenting with acute onset of left hemiplegia, left facial droop, right eye deviation and aphasia. Pt received IV tPA and underwent R MCA revacularization on 01/29/2020. Pt also found to have large left pleural effusion with near complete L lung collapse and L apical mass. Chest tube placed on 01/31/2020. s/p trach on 02/11/20.     PT Comments    Pt verbalizing more during session without trach. At one point said that she was waiting for her dad. Conversation nonsensical but could understand much of what she was saying. Max A +2 for supine to sit. More difficult to maintain sitting EOB on air mattress. Lateral scoot transfer performed bed to chair with max A +2. Pt did follow some basic commands with RUE and RLE. Progressing slowly but very engaged in session today. PT will continue to follow.     Follow Up Recommendations  SNF;Supervision/Assistance - 24 hour     Equipment Recommendations  Wheelchair (measurements PT);Wheelchair cushion (measurements PT);Hospital bed;3in1 (PT);Other (comment) (mechanical lift)    Recommendations for Other Services       Precautions / Restrictions Precautions Precautions: Fall Precaution Comments: Peg Required Braces or Orthoses: Splint/Cast Splint/Cast: soft elbow splint - L; B Prevalon boots Restrictions Weight Bearing Restrictions: No    Mobility  Bed Mobility Overal bed mobility: Needs Assistance Bed Mobility: Supine to Sit     Supine to sit: Max assist;+2 for physical assistance     General bed mobility comments: some R sided trunk activation with elevation into sitting, otherwise dependent  Transfers Overall transfer level: Needs assistance   Transfers: Lateral/Scoot Transfers          Lateral/Scoot Transfers: +2  physical assistance;Total assist General transfer comment: pt somewhat engaged through trunk with scooting bed to chair. DId not assist with UE's or LE's  Ambulation/Gait             General Gait Details: unable   Stairs             Wheelchair Mobility    Modified Rankin (Stroke Patients Only) Modified Rankin (Stroke Patients Only) Pre-Morbid Rankin Score: Slight disability Modified Rankin: Severe disability     Balance Overall balance assessment: Needs assistance Sitting-balance support: Bilateral upper extremity supported;Feet unsupported Sitting balance-Leahy Scale: Poor Sitting balance - Comments: Pt with L lateral lean today, mod A to maintain sitting. Position affected in part by air mattress.  Postural control: Left lateral lean                                  Cognition Arousal/Alertness: Awake/alert Behavior During Therapy: Flat affect Overall Cognitive Status: Impaired/Different from baseline Area of Impairment: Orientation;Attention;Memory;Following commands;Safety/judgement;Awareness;Problem solving                 Orientation Level: Disoriented to;Place;Time;Situation Current Attention Level: Focused Memory: Decreased recall of precautions;Decreased short-term memory Following Commands: Follows one step commands with increased time Safety/Judgement: Decreased awareness of safety;Decreased awareness of deficits Awareness: Intellectual Problem Solving: Slow processing;Decreased initiation;Difficulty sequencing;Requires verbal cues;Requires tactile cues General Comments: pt states she is waiting for her dad. followed 2 simple commands in regards to moving her RUE      Exercises General Exercises - Lower Extremity Long Arc Quad: AROM;10 reps;Seated;Right (  minimal ROM)    General Comments General comments (skin integrity, edema, etc.): Pt decannulated. Worked on crossing midline to L with gaze, needed facilitation at head and  neck and could not maintain once there. Interacted with therapist minimally when I was on her L, much more interactive when I was on her R. Used a mix of both to challenge her yet still be able to connect with her      Pertinent Vitals/Pain Pain Assessment: Faces Faces Pain Scale: No hurt    Home Living                      Prior Function            PT Goals (current goals can now be found in the care plan section) Acute Rehab PT Goals Patient Stated Goal: to sit up in bed PT Goal Formulation: With patient Time For Goal Achievement: 03/25/20 Potential to Achieve Goals: Poor Progress towards PT goals: Progressing toward goals    Frequency    Min 1X/week      PT Plan Current plan remains appropriate    Co-evaluation              AM-PAC PT "6 Clicks" Mobility   Outcome Measure  Help needed turning from your back to your side while in a flat bed without using bedrails?: A Lot Help needed moving from lying on your back to sitting on the side of a flat bed without using bedrails?: A Lot Help needed moving to and from a bed to a chair (including a wheelchair)?: Total Help needed standing up from a chair using your arms (e.g., wheelchair or bedside chair)?: Total Help needed to walk in hospital room?: Total Help needed climbing 3-5 steps with a railing? : Total 6 Click Score: 8    End of Session   Activity Tolerance: Patient tolerated treatment well Patient left: with call bell/phone within reach;in chair Nurse Communication: Mobility status;Need for lift equipment PT Visit Diagnosis: Other abnormalities of gait and mobility (R26.89);Muscle weakness (generalized) (M62.81);Hemiplegia and hemiparesis;Other symptoms and signs involving the nervous system (R29.898);Unsteadiness on feet (R26.81) Hemiplegia - Right/Left: Left Hemiplegia - dominant/non-dominant: Non-dominant Hemiplegia - caused by: Cerebral infarction     Time: 0934-1000 PT Time Calculation  (min) (ACUTE ONLY): 26 min  Charges:  $Therapeutic Activity: 23-37 mins                     Leighton Roach, [place]  Pager [phone] Office [place] 03/15/2020, 11:18 AM

## 2020-03-15 NOTE — Progress Notes (Signed)
  Speech Language Pathology Treatment: Cognitive-Linquistic  Patient Details Name: Amber Lopez MRN: [id number] DOB: 1940-11-08 Today's Date: 03/15/2020 Time: 2706-2376 SLP Time Calculation (min) (ACUTE ONLY): 13 min  Assessment / Plan / Recommendation Clinical Impression  Pt was seen for cognitive-linguistic treatment. Following decannulation, she was observed to have a reduced vocal intensity. Her verbal expression continues to be fluent, but contains mostly jargon and little relevant meaning. However, with direct modeling, she correctly stated that we were at [place]. She also continues to be highly distractible and requires mod-max multimodal cueing to attend to the task at hand. The primary task of the session targeted sustained attention, and she demonstrated increased difficulty as compared to the previous session. She was only able to name the number of 1/5 cards, but successfully named the color of all 5 playing cards with 2 verbal cues per card over a period of about 3 minutes. SLP will continue to f/u acutely to address cognition.   HPI HPI: Patient is a 79 y.o. female with PMH: HIV, depression, DM, HTN, HLD, arthritis, breast cancer, who was admitted on 10/7, who presented with acute onset of left hemiplegia, left facial droop, right eye deviation and aphasia. MR Brain revealed multifocal infarct in the right MCA territory affecting cortex and striatum, mild petechial hemorrhage as well as small acute left cerebellar infarct and generalized brain atrophy. She was intubated from 10/7 through 10/20 when trach was placed. PEG placed on 10/29.  Most recent CXR on 10/28 revealed persistent left base collapse/consolidation with small left pleural effusion. Patient has a Therapist, sports, changed to cuffless on 11/8, is tolerating trach collar at 8 Liters, 35 FiO2.      SLP Plan  Continue with current plan of care       Recommendations                   Oral Care  Recommendations: Oral care BID Follow up Recommendations: Skilled Nursing facility SLP Visit Diagnosis: Cognitive communication deficit (E83.151) Plan: Continue with current plan of care       GO                [name] 03/15/2020, 2:11 PM

## 2020-03-15 NOTE — Progress Notes (Addendum)
[facility] PROGRESS NOTE  IDELL HISSONG HWE:[id number] DOB: [birth year] DOA: 01/29/2020 PCP: Billie Ruddy, MD     11/16   11/22 Now decannulated   Status: Remains inpatient appropriate because:Altered mental status and Unsafe d/c plan   Dispo: The patient is from: Home              Anticipated d/c is to: SNF              Anticipated d/c date is: > 3 days              Patient currently is medically stable to d/c.   Code Status: DNR Family Communication: 11/15 spoke with daughter [name] DVT prophylaxis: Lovenox Vaccination status: Patient completed both doses of Pfizer Covid vaccine on 09/01/2019  Foley catheter: No  HPI: 79 year old female with past medical history for HIV/AIDS, hypertension, dyslipidemia, diabetes, depression, arthritis and breast cancer.  Patient presented to the ER on 10/7 with strokelike symptoms consisting of left hemiplegia, facial droop, aphasia.  CTA head neck showed emergent LVO at the right M1 segment with patient subsequently undergoing TPA and revascularization by neurology and IR.  She was admitted to the ICU postprocedure on mechanical ventilation.  Unfortunately patient's mentation and neurological status remained poor.  She was not able to follow commands and she was unable to wean from the ventilator.  Tracheostomy tube was placed on 10/20 and a PEG tube was placed on 10/29.  Unfortunately her neurological status has not improved.  Other complications since admission include a large left pleural effusion/left apical mass with pathology assistant with metastatic adenocarcinoma.  She has been denied for LTAC placement.  Palliative care has been discussing with patient family her poor prognosis and initially family requested patient be transferred to another facility but no clinical reason to transfer therefore patient remains facility.  Subjective: Alert and oriented.  Sitting up in chair.  No complaints.  Remains  confused.  Objective: Vitals:   03/15/20 0420 03/15/20 0617  BP: (!) 136/57 133/64  Pulse: 89 87  Resp: (!) 30 18  Temp: 98.2 F (36.8 C)   SpO2: 100% 100%    Intake/Output Summary (Last 24 hours) at 03/15/2020 0745 Last data filed at 03/15/2020 8938 Gross per 24 hour  Intake 2470 ml  Output 1500 ml  Net 970 ml   Filed Weights   03/10/20 0340 03/11/20 0500 03/15/20 0459  Weight: 56.7 kg 59.2 kg 92 kg    Exam:  Constitutional: NAD, calm, comfortable Respiratory: clear to auscultation bilaterally. Normal respiratory effort. RA Cardiovascular: Regular rate and rhythm, No lower extremity edema.  Extremities warm to touch. Abdomen: Non tender, Bowel sounds +.PEG tube; tolerating D1 diet  Musculoskeletal:  No spasticity observed with movement Neurologic: CN 2-12 grossly intact.  Sensation intact. Left hemineglect persists- left side weaker than right. Psychiatric: Alert and oriented to name and place.  Confused and pleasant.   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 -Given left hemineglect therefore OT recommended pillow splint at bedtime -Continue SLP (swallowing and speech/cognition)  Acute ventilatory dependent respiratory failure with hypoxemia/Inability to clear secretions/requires tracheostomy tube/COPD -11/19 decannulated by trach team -MBS: Tolerating D1 diet when fed-now that decannulated hopefully can progress diet-appreciate SLP assistance  Type 2 diabetes mellitus -A1c 6.5 on 10/7. -11/22 CBGs trending up now that more consistent intake of D1 diet plus TF-Lantus increased to 18 units this past WE   Stage IV  adenocarcinoma of the left lung with left pleural effusion  -CT of chest at time of admission: w/ large left pleural effusion with near complete left lung collapse. Left apical mass measuring 3.5 x 3.3 cm suspicious for bronchogenic  carcinoma -Pleural fluid pathology consistent with metastatic adenocarcinoma -poor performance status precludes chemotherapy with metastatic disease; 11/15 reinforced this with patient's daughter by telephone -Patient will follow with oncology as an outpatient if improves clinically (was evaluated by Dr. Narda Rutherford during this hospitalization)  Dysphagia/ Nutrition Status: Nutrition Status: Nutrition Problem: Moderate Malnutrition Etiology: chronic illness (HIV) Signs/Symptoms: mild muscle depletion, moderate muscle depletion, mild fat depletion, moderate fat depletion Interventions: Ensure Enlive (each supplement provides 350kcal and 20 grams of protein), Tube feeding, MVI-patient also tolerating D1 diet when fed-decannulated so hopeful can progress diet soon Estimated body mass index is 42.38 kg/m as calculated from the following:   Height as of this encounter: 4\' 10"  (1.473 m).   Weight as of this encounter: 92 kg.  Acute diarrhea -Resolved -Continue Bentyl and prn Imodium   Decubitus ulcer not POA Pressure Injury 02/16/20 Perineum Right Stage 2 -  Partial thickness loss of dermis presenting as a shallow open injury with a red, pink wound bed without slough. 2cm x 1cm pink (Active)  Date First Assessed/Time First Assessed: 02/16/20 1200   Location: Perineum  Location Orientation: Right  Staging: Stage 2 -  Partial thickness loss of dermis presenting as a shallow open injury with a red, pink wound bed without slough.  Wound Descri...    Assessments 02/16/2020 12:00 PM 03/14/2020  9:00 AM  Dressing Type Moisture barrier Foam - Lift dressing to assess site every shift  Dressing -- Clean;Dry;Intact;Changed  Site / Wound Assessment Clean;Pink Pink  Peri-wound Assessment Intact --  Wound Length (cm) 2 cm --  Wound Width (cm) 1 cm --  Wound Depth (cm) 0 cm --  Wound Surface Area (cm^2) 2 cm^2 --  Wound Volume (cm^3) 0 cm^3 --  Margins Unattached edges (unapproximated) --  Drainage  Amount Scant None  Treatment Cleansed --     No Linked orders to display      Other problems: Hypertension/left ventricular diastolic dysfunction -Heart rate and blood pressure stable on Coreg. -No clinical signs consistent with heart failure  HIV/AIDS -Continue home medications including: atovaquone, dolutegravir and descovy; discussing with pharmacy and ID about transitioning to more affordable option since we are pursuing SNF care and expensive medications may be barrier to placement  Chronic normocytic anemia -Hemoglobin stable at 9.0  GOC  -Family had requested transfer to another facility when informed poor prognosis.  At this time no clinical reason to transfer to other facility.  Palliative care following intermittently -LTAC denied -Case management assisting with SNF placement  Data Reviewed: Basic Metabolic Panel: Recent Labs  Lab 03/09/20 0818  NA 133*  K 5.1  CL 95*  CO2 27  GLUCOSE 212*  BUN 23  CREATININE 1.00  CALCIUM 9.0   Liver Function Tests: No results for input(s): AST, ALT, ALKPHOS, BILITOT, PROT, ALBUMIN in the last 168 hours. No results for input(s): LIPASE, AMYLASE in the last 168 hours. No results for input(s): AMMONIA in the last 168 hours. CBC: No results for input(s): WBC, NEUTROABS, HGB, HCT, MCV, PLT in the last 168 hours. Cardiac Enzymes: No results for input(s): CKTOTAL, CKMB, CKMBINDEX, TROPONINI in the last 168 hours. BNP (last 3 results) No results for input(s): BNP in the last 8760 hours.  ProBNP (last 3 results) Recent  Labs    08/07/19 1422  PROBNP 110.0*    CBG: Recent Labs  Lab 03/14/20 1144 03/14/20 1623 03/14/20 1953 03/15/20 0036 03/15/20 0420  GLUCAP 182* 144* 152* 188* 191*    No results found for this or any previous visit (from the past 240 hour(s)).   Studies: No results found.  Scheduled Meds: . aspirin  81 mg Per Tube Daily  . atovaquone  1,500 mg Per Tube Q breakfast  . carvedilol  3.125 mg  Per Tube BID WC  . chlorhexidine  15 mL Mouth Rinse BID  . Chlorhexidine Gluconate Cloth  6 each Topical Daily  . dicyclomine  10 mg Per Tube TID AC & HS  . dolutegravir  50 mg Per Tube Daily  . emtricitabine-tenofovir AF  1 tablet Oral Daily  . enoxaparin (LOVENOX) injection  40 mg Subcutaneous Q24H  . feeding supplement  237 mL Oral BID BM  . free water  200 mL Per Tube Q8H  . influenza vaccine adjuvanted  0.5 mL Intramuscular Tomorrow-1000  . insulin aspart  0-15 Units Subcutaneous Q4H  . insulin detemir  18 Units Subcutaneous BID  . mouth rinse  15 mL Mouth Rinse q12n4p  . multivitamin  15 mL Per Tube Daily  . pantoprazole sodium  40 mg Per Tube QHS  . QUEtiapine  12.5 mg Per Tube QHS  . rosuvastatin  10 mg Per NG tube Daily   Continuous Infusions: . sodium chloride 250 mL (02/11/20 2212)  . sodium chloride Stopped (02/09/20 0556)  . feeding supplement (OSMOLITE 1.2 CAL) 1,000 mL (03/14/20 1001)    Active Problems:   DNR (do not resuscitate) discussion   Acute right MCA stroke ([place])   Middle cerebral artery embolism, right   Malnutrition of moderate degree   Adenocarcinoma of left lung ([place])   Adenocarcinoma ([place])   Palliative care by specialist   Infiltrate noted on imaging study   Pressure injury of skin   Endotracheal tube present   Pleural effusion on left   Acute respiratory failure with hypoxia ([place])   Tracheostomy in place ([place])   Ventilator dependent ([place])   Oropharyngeal dysphagia   Consultants:  Neurology  Interventional radiology  PCCM  Oncology  Palliative care  Procedures:  Echocardiogram  EEG  Core track placement  Tracheostomy tube  PEG tube  Antibiotics: Anti-infectives (From admission, onward)   Start     Dose/Rate Route Frequency Ordered Stop   03/13/20 1000  dolutegravir (TIVICAY) tablet 50 mg        50 mg Per Tube Daily 03/13/20 0712     03/05/20 1000  dolutegravir (TIVICAY) tablet 50 mg  Status:  Discontinued        50  mg Oral Daily 03/04/20 1008 03/13/20 0712   03/05/20 1000  emtricitabine-tenofovir AF (DESCOVY) 200-25 MG per tablet 1 tablet        1 tablet Oral Daily 03/04/20 1008     03/04/20 0930  atovaquone (MEPRON) 750 MG/5ML suspension 1,500 mg        1,500 mg Per Tube Daily with breakfast 03/04/20 0833     03/04/20 0800  Darunavir-Cobicisctat-Emtricitabine-Tenofovir Alafenamide (SYMTUZA) 800-150-200-10 MG TABS 1 tablet  Status:  Discontinued        1 tablet Oral Daily with breakfast 03/03/20 1345 03/04/20 1008   02/12/20 1000  vancomycin (VANCOREADY) IVPB 1250 mg/250 mL        1,250 mg 166.7 mL/hr over 90 Minutes Intravenous Every 24 hours 02/11/20 1509 02/18/20 1438  02/07/20 1310  vancomycin (VANCOREADY) IVPB 750 mg/150 mL  Status:  Discontinued        750 mg 150 mL/hr over 60 Minutes Intravenous Every 12 hours 02/07/20 1310 02/11/20 1509   02/06/20 0000  vancomycin (VANCOREADY) IVPB 500 mg/100 mL  Status:  Discontinued        500 mg 100 mL/hr over 60 Minutes Intravenous Every 12 hours 02/05/20 1141 02/07/20 1310   02/05/20 1300  ceFEPIme (MAXIPIME) 2 g in sodium chloride 0.9 % 100 mL IVPB        2 g 200 mL/hr over 30 Minutes Intravenous Every 12 hours 02/05/20 1127 02/11/20 2140   02/05/20 1300  atovaquone (MEPRON) 750 MG/5ML suspension 1,500 mg  Status:  Discontinued        1,500 mg Per Tube Daily with breakfast 02/05/20 1137 03/03/20 1345   02/05/20 1230  vancomycin (VANCOCIN) IVPB 1000 mg/200 mL premix        1,000 mg 200 mL/hr over 60 Minutes Intravenous  Once 02/05/20 1127 02/05/20 1304   01/30/20 1000  dolutegravir (TIVICAY) tablet 50 mg  Status:  Discontinued        50 mg Oral Daily 01/30/20 0941 03/03/20 1345   01/30/20 1000  emtricitabine-tenofovir AF (DESCOVY) 200-25 MG per tablet 1 tablet  Status:  Discontinued        1 tablet Per Tube Daily 01/30/20 0941 03/03/20 1345   01/29/20 1600  emtricitabine-tenofovir AF (DESCOVY) 200-25 MG per tablet 1 tablet  Status:  Discontinued         1 tablet Per Tube Daily 01/29/20 1437 01/29/20 1507   01/29/20 1600  dolutegravir (TIVICAY) tablet 50 mg  Status:  Discontinued        50 mg Oral Daily 01/29/20 1437 01/29/20 1507   01/29/20 1415  bictegravir-emtricitabine-tenofovir AF (BIKTARVY) 50-200-25 MG per tablet 1 tablet  Status:  Discontinued        1 tablet Oral Daily 01/29/20 1408 01/29/20 1437   01/29/20 1045  amoxicillin-clavulanate (AUGMENTIN) 875-125 MG per tablet 1 tablet  Status:  Discontinued        1 tablet Oral 2 times daily 01/29/20 1041 01/29/20 1510   01/29/20 1045  bictegravir-emtricitabine-tenofovir AF (BIKTARVY) 50-200-25 MG per tablet 1 tablet  Status:  Discontinued        1 tablet Oral Daily 01/29/20 1041 01/29/20 1042   01/29/20 0913  ceFAZolin (ANCEF) 2-4 GM/100ML-% IVPB       Note to Pharmacy: Tressie Stalker   : cabinet override      01/29/20 0913 01/29/20 2114       Time spent: 20 minutes    Erin Hearing ANP  [facility] Pager [phone]. If 7PM-7AM, please contact night-coverage at [email or web address] 03/15/2020, 7:45 AM  LOS: 46 days

## 2020-03-15 NOTE — Progress Notes (Signed)
Nutrition Follow-up  DOCUMENTATION CODES:   Non-severe (moderate) malnutrition in context of chronic illness  INTERVENTION:  Recommend consult to Diabetes Coordinator  Pt's PO intake continues to be poor. Will order meal assistance with meals and continue orders for Ensure Enlive BID in hopes of improving PO intake.   Until po intake improves, recommend continuing to meet 100% of pt's needs with TF via PEG: -Continue Osmolite 1.2 cal @ 59m/hr -Continue free water per MD/PA, currently  2083mfree water Q8H   TF regimen provides 1728 kcals, 80 grams of protein, and 116652mree water (1766m53mtal free water with flushes). Regimen meets 100% of needs.   NUTRITION DIAGNOSIS:   Moderate Malnutrition related to chronic illness (HIV) as evidenced by mild muscle depletion, moderate muscle depletion, mild fat depletion, moderate fat depletion.  ongoing  GOAL:   Patient will meet greater than or equal to 90% of their needs  Met with TF  MONITOR:   PO intake, Supplement acceptance, Skin, Diet advancement, Weight trends, Labs, I & O's  REASON FOR ASSESSMENT:   Consult Enteral/tube feeding initiation and management  ASSESSMENT:   79 y36female presents with left sided weakness, facial droop, aphasia and right eye deviation and  admitted with ischemic stoke insetting of large vessel occlusion of right MCA M1 segment requiring revascularization, acute blood loss anemia with concern of retroperitoneal bleed, respiratory failure requiring intubation. PMH includes HIV, HTN, HLD, DM, depression, hx of breast cancer  10/07 - admitted, intubated, s/p revascularization of R MCA M1 occlusion 10/08 -CT chest with new mass consistent with malignant adenocarcinoma,RI multifocal infarct with mild petechial hemorrhage with small L cerebella infarct 10/09 - chest tube placed for large left pleural effusion 10/19 - GOC discussion, plan for trach/PEG 10/20 - trach, cortrak placed 10/28 failed PSV  due to tachypnea 10/29 PEG placed 11/2 MBS, dysphagia 1 diet with thin liquids ordered 11/19 pt decannulated  Discussed pt with RN who reports pt continues to have very little po intake, if any. Pt doing fairly well with Ensure supplements when provided assistance. Will continue orders for TF to meet 100% of pt's needs until pt's intake improves. Will also order assistance with meals. Current TF: Osmolite 1.2 via PEG @ 60ml94mwith 200ml 65m water Q8H  PO Intake: 10-60% intake x last 8 recorded meals (37.5% average meal intake)  UOP: 1500ml x47mours  Labs: Na 133 (L), CBGs 191-229-262 Medications: ss novolog, 18 units levemir BID, MVI, protonix   Diet Order:   Diet Order            DIET - DYS 1 Room service appropriate? No; Fluid consistency: Thin  Diet effective now                 EDUCATION NEEDS:   Not appropriate for education at this time  Skin:  Skin Assessment: Skin Integrity Issues: Skin Integrity Issues:: Stage II Stage II: R perineum  Last BM:  unknown  Height:   Ht Readings from Last 1 Encounters:  01/29/20 4' 10"  (1.473 m)    Weight:   Wt Readings from Last 1 Encounters:  03/15/20 92 kg   BMI:  Body mass index is 42.38 kg/m.  Estimated Nutritional Needs:   Kcal:  1650-1850 kcals  Protein:  75-90 g  Fluid:  >/= 1.7  L    Argie Applegate Larkin InaD, LDN RD pager number and weekend/on-call pager number located in [email or web address]

## 2020-03-15 NOTE — TOC Progression Note (Addendum)
Transition of Care [facility]) - Progression Note    Patient Details  Name: Amber Lopez MRN: [id number] Date of Birth: 01-Oct-1940  [facility] CM/SW [place], [place] Phone Number: 03/15/2020, 1:54 PM  Clinical Narrative:     CSW spoke with [name], pt's dtr, bed offers given. She will follow up with SW when a decision has been made. Will hold off on Ins auth until placement is identified.  Civil [employer] fast streamer started  Expected Discharge Plan: [place] Barriers to Discharge: Continued Medical Work up, SNF Pending bed offer  Expected Discharge Plan and Services Expected Discharge Plan: [place] In-house Referral: Clinical Social Work Discharge Planning Services: CM Consult Post Acute Care Choice: [place] arrangements for the past 2 months: Single Family Home                                       Social Determinants of Health (SDOH) Interventions    Readmission Risk Interventions No flowsheet data found.

## 2020-03-16 DIAGNOSIS — I63511 Cerebral infarction due to unspecified occlusion or stenosis of right middle cerebral artery: Secondary | ICD-10-CM | POA: Diagnosis not present

## 2020-03-16 DIAGNOSIS — R1312 Dysphagia, oropharyngeal phase: Secondary | ICD-10-CM | POA: Diagnosis not present

## 2020-03-16 DIAGNOSIS — E440 Moderate protein-calorie malnutrition: Secondary | ICD-10-CM | POA: Diagnosis not present

## 2020-03-16 LAB — GLUCOSE, CAPILLARY
Glucose-Capillary: 164 mg/dL — ABNORMAL HIGH (ref 70–99)
Glucose-Capillary: 189 mg/dL — ABNORMAL HIGH (ref 70–99)
Glucose-Capillary: 198 mg/dL — ABNORMAL HIGH (ref 70–99)
Glucose-Capillary: 204 mg/dL — ABNORMAL HIGH (ref 70–99)
Glucose-Capillary: 223 mg/dL — ABNORMAL HIGH (ref 70–99)
Glucose-Capillary: 242 mg/dL — ABNORMAL HIGH (ref 70–99)

## 2020-03-16 NOTE — Progress Notes (Addendum)
CSW attempted to call pt daughter; voicemail box full, unable to leave message  1107: Daughter returned call to [place]. Explained she has been unable to review offers with her brother. She states that she will be able to make a decision by Monday 11/29. CSW explains that pt may be d/c'd prior to then and a decision may need to be made sooner. Daughter requests that bed offers be sent to facilities in [place] area. CSW expanded bed search to [facility].   13: CSW spoke with daughter and explained that pt is medically ready and that MD is planning d/c. CSW explained that choice would need to be made by 2pm on 11/24. Daughter requested CSW looked into [facility] as option.

## 2020-03-16 NOTE — Progress Notes (Signed)
[facility] PROGRESS NOTE  Amber Lopez GGY:[id number] DOB: May 02, 1940 DOA: 01/29/2020 PCP: Billie Ruddy, MD     11/16   11/22 Now decannulated   Status: Remains inpatient appropriate because:Altered mental status and Unsafe d/c plan   Dispo: The patient is from: Home              Anticipated d/c is to: SNF              Anticipated d/c date is: > 3 days              Patient currently is medically stable to d/c.   Code Status: DNR Family Communication: 11/15 spoke with daughter [name] DVT prophylaxis: Lovenox Vaccination status: Patient completed both doses of Pfizer Covid vaccine on 09/01/2019  Foley catheter: No  HPI: 79 year old female with past medical history for HIV/AIDS, hypertension, dyslipidemia, diabetes, depression, arthritis and breast cancer.  Patient presented to the ER on 10/7 with strokelike symptoms consisting of left hemiplegia, facial droop, aphasia.  CTA head neck showed emergent LVO at the right M1 segment with patient subsequently undergoing TPA and revascularization by neurology and IR.  She was admitted to the ICU postprocedure on mechanical ventilation.  Unfortunately patient's mentation and neurological status remained poor.  She was not able to follow commands and she was unable to wean from the ventilator.  Tracheostomy tube was placed on 10/20 and a PEG tube was placed on 10/29.  Unfortunately her neurological status has not improved.  Other complications since admission include a large left pleural effusion/left apical mass with pathology assistant with metastatic adenocarcinoma.  She has been denied for LTAC placement.  Palliative care has been discussing with patient family her poor prognosis and initially family requested patient be transferred to another facility but no clinical reason to transfer therefore patient remains facility.  Subjective: Alert and nods name.  Very pleasant without any specific complaints  vocalized.  Objective: Vitals:   03/16/20 0306 03/16/20 0734  BP: 118/66 (!) 115/53  Pulse: 88   Resp: 18 (!) 21  Temp: 98.5 F (36.9 C) 98.5 F (36.9 C)  SpO2: 98% 100%    Intake/Output Summary (Last 24 hours) at 03/16/2020 1145 Last data filed at 03/16/2020 0900 Gross per 24 hour  Intake --  Output 700 ml  Net -700 ml   Filed Weights   03/11/20 0500 03/15/20 0459 03/16/20 0306  Weight: 59.2 kg 92 kg 90 kg    Exam:  Constitutional: NAD, calm, comfortable Respiratory: clear to auscultation bilaterally. Normal respiratory effort. RA Cardiovascular: Regular rate and rhythm, No lower extremity edema.  Extremities pink and warm with adequate capillary refill. Abdomen: Non tender, Bowel sounds +.PEG tube; tolerating D1 diet  Musculoskeletal:  No spasticity observed with movement Neurologic: CN 2-12 grossly intact.  Sensation intact. Left hemineglect persists- left side weaker than right. Psychiatric: Alert and oriented to name and place.  Confused and pleasant.   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 -OT recommended pillow splint at bedtime 2/2 ongoing hemineglect and increased risk for LUE contracture -Continue SLP (swallowing and speech/cognition)  Acute ventilatory dependent respiratory failure with hypoxemia/Inability to clear secretions/requires tracheostomy tube/COPD -11/19 decannulated by trach team -MBS: Tolerating D1 diet when fed-now that decannulated hopefully can progress diet-appreciate SLP assistance  Type 2 diabetes mellitus -A1c 6.5 on 10/7. -11/22 CBGs trending up now that more consistent intake of D1 diet plus TF-Lantus increased to  18 units this past WE  Stage IV adenocarcinoma of the left lung with left pleural effusion  -CT of chest at time of admission: w/ large left pleural effusion with near complete left lung collapse. Left apical mass  measuring 3.5 x 3.3 cm suspicious for bronchogenic carcinoma -Pleural fluid pathology consistent with metastatic adenocarcinoma -poor performance status precludes chemotherapy with metastatic disease; 11/15 reinforced this with patient's daughter by telephone -Needs to follow-up with oncology as an outpatient if improves clinically (was evaluated by Dr. Narda Rutherford during this hospitalization)  Dysphagia/ Nutrition Status: Nutrition Status: Nutrition Problem: Moderate Malnutrition Etiology: chronic illness (HIV) Signs/Symptoms: mild muscle depletion, moderate muscle depletion, mild fat depletion, moderate fat depletion Interventions: Ensure Enlive (each supplement provides 350kcal and 20 grams of protein), Tube feeding, MVI-patient also tolerating D1 diet when fed-decannulated so hopeful can progress diet soon Estimated body mass index is 41.47 kg/m as calculated from the following:   Height as of this encounter: 4\' 10"  (1.473 m).   Weight as of this encounter: 90 kg.  Acute diarrhea -Resolved -Continue Bentyl and prn Imodium   Decubitus ulcer not POA Pressure Injury 02/16/20 Perineum Right Stage 2 -  Partial thickness loss of dermis presenting as a shallow open injury with a red, pink wound bed without slough. 2cm x 1cm pink (Active)  Date First Assessed/Time First Assessed: 02/16/20 1200   Location: Perineum  Location Orientation: Right  Staging: Stage 2 -  Partial thickness loss of dermis presenting as a shallow open injury with a red, pink wound bed without slough.  Wound Descri...    Assessments 02/16/2020 12:00 PM 03/16/2020  7:34 AM  Dressing Type Moisture barrier Foam - Lift dressing to assess site every shift  Dressing -- Clean;Dry;Intact  Site / Wound Assessment Clean;Pink --  Peri-wound Assessment Intact --  Wound Length (cm) 2 cm --  Wound Width (cm) 1 cm --  Wound Depth (cm) 0 cm --  Wound Surface Area (cm^2) 2 cm^2 --  Wound Volume (cm^3) 0 cm^3 --  Margins Unattached  edges (unapproximated) --  Drainage Amount Scant --  Treatment Cleansed --     No Linked orders to display      Other problems: Hypertension/left ventricular diastolic dysfunction -Heart rate and blood pressure stable on Coreg. -No clinical signs consistent with heart failure  HIV/AIDS -Continue home medications including: atovaquone, dolutegravir and descovy; discussing with pharmacy and ID about transitioning to more affordable option since we are pursuing SNF care and expensive medications may be barrier to placement  Chronic normocytic anemia -Hemoglobin stable at 9.0  GOC  -Family had requested transfer to another facility when informed poor prognosis.  At this time no clinical reason to transfer to other facility.  Palliative care following intermittently -LTAC denied -Case management assisting with SNF placement  Data Reviewed: Basic Metabolic Panel: No results for input(s): NA, K, CL, CO2, GLUCOSE, BUN, CREATININE, CALCIUM, MG, PHOS in the last 168 hours. Liver Function Tests: No results for input(s): AST, ALT, ALKPHOS, BILITOT, PROT, ALBUMIN in the last 168 hours. No results for input(s): LIPASE, AMYLASE in the last 168 hours. No results for input(s): AMMONIA in the last 168 hours. CBC: No results for input(s): WBC, NEUTROABS, HGB, HCT, MCV, PLT in the last 168 hours. Cardiac Enzymes: No results for input(s): CKTOTAL, CKMB, CKMBINDEX, TROPONINI in the last 168 hours. BNP (last 3 results) No results for input(s): BNP in the last 8760 hours.  ProBNP (last 3 results) Recent Labs    08/07/19  1422  PROBNP 110.0*    CBG: Recent Labs  Lab 03/15/20 1935 03/15/20 2316 03/16/20 0316 03/16/20 0745 03/16/20 1136  GLUCAP 232* 206* 164* 204* 223*    No results found for this or any previous visit (from the past 240 hour(s)).   Studies: No results found.  Scheduled Meds: . aspirin  81 mg Per Tube Daily  . atovaquone  1,500 mg Per Tube Q breakfast  .  carvedilol  3.125 mg Per Tube BID WC  . chlorhexidine  15 mL Mouth Rinse BID  . Chlorhexidine Gluconate Cloth  6 each Topical Daily  . dicyclomine  10 mg Per Tube TID AC & HS  . dolutegravir  50 mg Per Tube Daily  . emtricitabine-tenofovir AF  1 tablet Per Tube Daily  . enoxaparin (LOVENOX) injection  40 mg Subcutaneous Q24H  . feeding supplement  237 mL Oral BID BM  . free water  200 mL Per Tube Q8H  . insulin aspart  0-15 Units Subcutaneous Q4H  . insulin detemir  18 Units Subcutaneous BID  . mouth rinse  15 mL Mouth Rinse q12n4p  . multivitamin  15 mL Per Tube Daily  . pantoprazole sodium  40 mg Per Tube QHS  . QUEtiapine  12.5 mg Per Tube QHS  . rosuvastatin  10 mg Per NG tube Daily   Continuous Infusions: . sodium chloride 250 mL (02/11/20 2212)  . sodium chloride Stopped (02/09/20 0556)  . feeding supplement (OSMOLITE 1.2 CAL) 1,000 mL (03/14/20 1001)    Active Problems:   DNR (do not resuscitate) discussion   Acute right MCA stroke ([place])   Middle cerebral artery embolism, right   Malnutrition of moderate degree   Adenocarcinoma of left lung ([place])   Adenocarcinoma ([place])   Palliative care by specialist   Infiltrate noted on imaging study   Pressure injury of skin   Endotracheal tube present   Pleural effusion on left   Acute respiratory failure with hypoxia ([place])   Tracheostomy in place ([place])   Ventilator dependent ([place])   Oropharyngeal dysphagia   Consultants:  Neurology  Interventional radiology  PCCM  Oncology  Palliative care  Procedures:  Echocardiogram  EEG  Core track placement  Tracheostomy tube  PEG tube  Antibiotics: Anti-infectives (From admission, onward)   Start     Dose/Rate Route Frequency Ordered Stop   03/15/20 1000  emtricitabine-tenofovir AF (DESCOVY) 200-25 MG per tablet 1 tablet        1 tablet Per Tube Daily 03/15/20 0832     03/13/20 1000  dolutegravir (TIVICAY) tablet 50 mg        50 mg Per Tube Daily 03/13/20 0712      03/05/20 1000  dolutegravir (TIVICAY) tablet 50 mg  Status:  Discontinued        50 mg Oral Daily 03/04/20 1008 03/13/20 0712   03/05/20 1000  emtricitabine-tenofovir AF (DESCOVY) 200-25 MG per tablet 1 tablet  Status:  Discontinued        1 tablet Oral Daily 03/04/20 1008 03/15/20 0832   03/04/20 0930  atovaquone (MEPRON) 750 MG/5ML suspension 1,500 mg        1,500 mg Per Tube Daily with breakfast 03/04/20 0833     03/04/20 0800  Darunavir-Cobicisctat-Emtricitabine-Tenofovir Alafenamide (SYMTUZA) 800-150-200-10 MG TABS 1 tablet  Status:  Discontinued        1 tablet Oral Daily with breakfast 03/03/20 1345 03/04/20 1008   02/12/20 1000  vancomycin (VANCOREADY) IVPB 1250 mg/250 mL  1,250 mg 166.7 mL/hr over 90 Minutes Intravenous Every 24 hours 02/11/20 1509 02/18/20 1438   02/07/20 1310  vancomycin (VANCOREADY) IVPB 750 mg/150 mL  Status:  Discontinued        750 mg 150 mL/hr over 60 Minutes Intravenous Every 12 hours 02/07/20 1310 02/11/20 1509   02/06/20 0000  vancomycin (VANCOREADY) IVPB 500 mg/100 mL  Status:  Discontinued        500 mg 100 mL/hr over 60 Minutes Intravenous Every 12 hours 02/05/20 1141 02/07/20 1310   02/05/20 1300  ceFEPIme (MAXIPIME) 2 g in sodium chloride 0.9 % 100 mL IVPB        2 g 200 mL/hr over 30 Minutes Intravenous Every 12 hours 02/05/20 1127 02/11/20 2140   02/05/20 1300  atovaquone (MEPRON) 750 MG/5ML suspension 1,500 mg  Status:  Discontinued        1,500 mg Per Tube Daily with breakfast 02/05/20 1137 03/03/20 1345   02/05/20 1230  vancomycin (VANCOCIN) IVPB 1000 mg/200 mL premix        1,000 mg 200 mL/hr over 60 Minutes Intravenous  Once 02/05/20 1127 02/05/20 1304   01/30/20 1000  dolutegravir (TIVICAY) tablet 50 mg  Status:  Discontinued        50 mg Oral Daily 01/30/20 0941 03/03/20 1345   01/30/20 1000  emtricitabine-tenofovir AF (DESCOVY) 200-25 MG per tablet 1 tablet  Status:  Discontinued        1 tablet Per Tube Daily 01/30/20 0941  03/03/20 1345   01/29/20 1600  emtricitabine-tenofovir AF (DESCOVY) 200-25 MG per tablet 1 tablet  Status:  Discontinued        1 tablet Per Tube Daily 01/29/20 1437 01/29/20 1507   01/29/20 1600  dolutegravir (TIVICAY) tablet 50 mg  Status:  Discontinued        50 mg Oral Daily 01/29/20 1437 01/29/20 1507   01/29/20 1415  bictegravir-emtricitabine-tenofovir AF (BIKTARVY) 50-200-25 MG per tablet 1 tablet  Status:  Discontinued        1 tablet Oral Daily 01/29/20 1408 01/29/20 1437   01/29/20 1045  amoxicillin-clavulanate (AUGMENTIN) 875-125 MG per tablet 1 tablet  Status:  Discontinued        1 tablet Oral 2 times daily 01/29/20 1041 01/29/20 1510   01/29/20 1045  bictegravir-emtricitabine-tenofovir AF (BIKTARVY) 50-200-25 MG per tablet 1 tablet  Status:  Discontinued        1 tablet Oral Daily 01/29/20 1041 01/29/20 1042   01/29/20 0913  ceFAZolin (ANCEF) 2-4 GM/100ML-% IVPB       Note to Pharmacy: Tressie Stalker   : cabinet override      01/29/20 0913 01/29/20 2114       Time spent: 20 minutes    Erin Hearing ANP  [facility] Pager [phone]. If 7PM-7AM, please contact night-coverage at [email or web address] 03/16/2020, 11:45 AM  LOS: 47 days

## 2020-03-17 DIAGNOSIS — R1312 Dysphagia, oropharyngeal phase: Secondary | ICD-10-CM | POA: Diagnosis not present

## 2020-03-17 DIAGNOSIS — E440 Moderate protein-calorie malnutrition: Secondary | ICD-10-CM | POA: Diagnosis not present

## 2020-03-17 DIAGNOSIS — C3492 Malignant neoplasm of unspecified part of left bronchus or lung: Secondary | ICD-10-CM | POA: Diagnosis not present

## 2020-03-17 DIAGNOSIS — I63511 Cerebral infarction due to unspecified occlusion or stenosis of right middle cerebral artery: Secondary | ICD-10-CM | POA: Diagnosis not present

## 2020-03-17 LAB — GLUCOSE, CAPILLARY
Glucose-Capillary: 175 mg/dL — ABNORMAL HIGH (ref 70–99)
Glucose-Capillary: 189 mg/dL — ABNORMAL HIGH (ref 70–99)
Glucose-Capillary: 209 mg/dL — ABNORMAL HIGH (ref 70–99)
Glucose-Capillary: 159 mg/dL — ABNORMAL HIGH (ref 70–99)
Glucose-Capillary: 143 mg/dL — ABNORMAL HIGH (ref 70–99)

## 2020-03-17 MED ORDER — CARVEDILOL 3.125 MG PO TABS: 3.1250 mg | ORAL_TABLET | Freq: Two times a day (BID) | ORAL | Status: AC

## 2020-03-17 MED ORDER — ACETAMINOPHEN 325 MG PO TABS: 650.0000 mg | ORAL_TABLET | ORAL | Status: DC | PRN

## 2020-03-17 MED ORDER — ATOVAQUONE 750 MG/5ML PO SUSP: 1500.0000 mg | Freq: Every day | ORAL | 0 refills | Status: AC

## 2020-03-17 MED ORDER — DOLUTEGRAVIR SODIUM 50 MG PO TABS
50.0000 mg | ORAL_TABLET | Freq: Every day | ORAL | 0 refills | Status: DC
Start: 2020-03-18 — End: 2020-03-19

## 2020-03-17 MED ORDER — FREE WATER: 200.0000 mL | Freq: Four times a day (QID) | Status: AC

## 2020-03-17 MED ORDER — PANTOPRAZOLE SODIUM 40 MG PO PACK: 40.0000 mg | PACK | Freq: Every day | ORAL | Status: DC

## 2020-03-17 MED ORDER — INSULIN DETEMIR 100 UNIT/ML ~~LOC~~ SOLN
18.0000 [IU] | Freq: Two times a day (BID) | SUBCUTANEOUS | 11 refills | Status: DC
Start: 2020-03-17 — End: 2020-03-21

## 2020-03-17 MED ORDER — ROSUVASTATIN CALCIUM 10 MG PO TABS: 10.0000 mg | ORAL_TABLET | Freq: Every day | ORAL | Status: AC

## 2020-03-17 MED ORDER — OSMOLITE 1.5 CAL PO LIQD: 237.0000 mL | Freq: Every day | ORAL | 0 refills | Status: AC

## 2020-03-17 MED ORDER — DICYCLOMINE HCL 10 MG/5ML PO SOLN: 10.0000 mg | Freq: Three times a day (TID) | ORAL | 12 refills | Status: AC

## 2020-03-17 MED ORDER — INSULIN ASPART 100 UNIT/ML ~~LOC~~ SOLN
0.0000 [IU] | SUBCUTANEOUS | 11 refills | Status: AC
Start: 2020-03-17 — End: ?

## 2020-03-17 MED ORDER — ENSURE ENLIVE PO LIQD: 237.0000 mL | Freq: Two times a day (BID) | ORAL | 12 refills | Status: AC

## 2020-03-17 MED ORDER — FREE WATER
200.0000 mL | Freq: Four times a day (QID) | Status: DC
  Administered 2020-03-17 – 2020-03-22 (×20): 200 mL

## 2020-03-17 MED ORDER — EMTRICITABINE-TENOFOVIR AF 200-25 MG PO TABS: 1.0000 | ORAL_TABLET | Freq: Every day | ORAL | 0 refills | Status: DC

## 2020-03-17 MED ORDER — ASPIRIN 81 MG PO CHEW: 81.0000 mg | CHEWABLE_TABLET | Freq: Every day | ORAL | Status: AC

## 2020-03-17 MED ORDER — QUETIAPINE FUMARATE 25 MG PO TABS: 12.5000 mg | ORAL_TABLET | Freq: Every day | ORAL | 0 refills | Status: AC

## 2020-03-17 MED ORDER — OSMOLITE 1.5 CAL PO LIQD
237.0000 mL | Freq: Every day | ORAL | Status: DC
  Administered 2020-03-17 – 2020-03-22 (×25): 237 mL
  Filled 2020-03-17 (×9): qty 237

## 2020-03-17 NOTE — TOC Progression Note (Addendum)
Transition of Care [facility]) - Progression Note    Patient Details  Name: JACQUILINE ZURCHER MRN: [id number] Date of Birth: June 01, 1940  Transition of Care [facility]) CM/SW [place], [place] Phone Number: 03/17/2020, 1:47 PM  Clinical Narrative:     Pt daughter contacted CSW to discuss choices. Daughter still upset about bed offers. She asks about [facility], [facility], [facility], and [facility]. CSW explains that facilities have not accepted pt. CSW explains that no where else has accepted pt.  Daughter expresses frustration and desire to talk with CSW's supervisor. CSW informs daughter that supervisor would give her a call.   CSW called facilities to confirm no offers:   [facility]: has no beds available [facility]: not able to [facility]: no bed availability [facility]: No response     Expected Discharge Plan: [place] Barriers to Discharge: Continued Medical Work up, SNF Pending bed offer  Expected Discharge Plan and Services Expected Discharge Plan: [place] In-house Referral: Clinical Social Work Discharge Planning Services: CM Consult Post Acute Care Choice: [place] arrangements for the past 2 months: Single Family Home Expected Discharge Date: 03/17/20                                     Social Determinants of Health (SDOH) Interventions    Readmission Risk Interventions No flowsheet data found.

## 2020-03-17 NOTE — Progress Notes (Addendum)
Occupational Therapy Treatment Patient Details Name: Amber Lopez MRN: [id number] DOB: 10-23-1940 Today's Date: 03/17/2020    History of present illness 79 y.o. female with a PMHx of depression, HLD, HIV, HTN and DM presenting with acute onset of left hemiplegia, left facial droop, right eye deviation and aphasia. Pt received IV tPA and underwent R MCA revacularization on 01/29/2020. Pt also found to have large left pleural effusion with near complete L lung collapse and L apical mass. Chest tube placed on 01/31/2020. s/p trach on 02/11/20.    OT comments  Pt progressed to EOB and sat x 15 min with min A at times. L lateral lean with apparent trunk shortening on L. Pt initially nonverbal, however pt eventually began talking but apparently confused. Max A with sefl feeding and grooming. Inhibition techniques used to increase L elbow extension. Moving L hand appropriately. Recommend use of L elbow pillow splint during the night to facilitate use of hand during the day. Pt placed in R sidelying to facilitate L trunk elongation and neck ROM. Nsg notified. Will continue to follow acutely and recommend SNF at DC.   Follow Up Recommendations  Supervision/Assistance - 24 hour;SNF    Equipment Recommendations  Hospital bed;Wheelchair cushion (measurements OT);Wheelchair (measurements OT);Other (comment)    Recommendations for Other Services      Precautions / Restrictions Precautions Precautions: Fall Precaution Comments: Peg Required Braces or Orthoses: Splint/Cast Splint/Cast: soft elbow splint - L; B Prevalon boots       Mobility Bed Mobility Overal bed mobility: Needs Assistance   Rolling: Max assist;+2 for physical assistance Sidelying to sit: +2 for physical assistance;Max assist   Sit to supine: +2 for physical assistance;Max assist   General bed mobility comments: Repetition used with rolling- improved motor pattern with use of repetition; Pt able to initiate moving BLE off bed.  Pushing up through [place]. Assistance to elevate trunk and maintain midlien postural control  Transfers                      Balance     Sitting balance-Leahy Scale: Poor Sitting balance - Comments: L bias; trunk shortening on L                                   ADL either performed or assessed with clinical judgement   ADL Overall ADL's : Needs assistance/impaired Eating/Feeding: Maximal assistance Eating/Feeding Details (indicate cue type and reason): hand over hadn using L hand Grooming: Maximal assistance   Upper Body Bathing: Maximal assistance   Lower Body Bathing: Total assistance;+2 for physical assistance   Upper Body Dressing : Total assistance;Bed level   Lower Body Dressing: Total assistance;Bed level               Functional mobility during ADLs: +2 for physical assistance;Total assistance       Vision   Vision Assessment?: Vision impaired- to be further tested in functional context Additional Comments: most likely L field cut   Perception     Praxis      Cognition Arousal/Alertness: Awake/alert Behavior During Therapy: Restless;Flat affect Overall Cognitive Status: Impaired/Different from baseline Area of Impairment: Orientation;Attention;Memory;Awareness;Safety/judgement;Following commands;Problem solving                 Orientation Level: Disoriented to;Place;Time;Situation Current Attention Level: Focused Memory: Decreased recall of precautions;Decreased short-term memory Following Commands: Follows one step commands inconsistently;Follows one step commands with  increased time Safety/Judgement: Decreased awareness of safety;Decreased awareness of deficits Awareness: Intellectual Problem Solving: Slow processing;Decreased initiation;Difficulty sequencing;Requires verbal cues;Requires tactile cues General Comments: appears apraxic        Exercises General Exercises - Upper Extremity Shoulder Flexion:  AAROM;Right;5 reps Shoulder ABduction: AAROM;Both;10 reps Shoulder ADduction: AAROM;Both;10 reps Elbow Flexion: AAROM;Both;10 reps Elbow Extension: AAROM;Both;10 reps Wrist Flexion: AAROM;10 reps;Both Wrist Extension: AAROM;Both;10 reps Other Exercises Other Exercises: inhibition L bicep muscle belly followed by continuous passive stretch into extension. Lacks @ 20-30 degrees extension. HAs soft pillow elbow splint   Shoulder Instructions       General Comments tangential; language of confusion throughout    Pertinent Vitals/ Pain       Pain Assessment: Faces Faces Pain Scale: Hurts little more Pain Location: generalized with mobility, especially neck ROM Pain Descriptors / Indicators: Grimacing;Discomfort Pain Intervention(s): Limited activity within patient's tolerance  Home Living                                          Prior Functioning/Environment              Frequency  Min 2X/week        Progress Toward Goals  OT Goals(current goals can now be found in the care plan section)  Progress towards OT goals: Progressing toward goals  Acute Rehab OT Goals Patient Stated Goal: to sit up in bed OT Goal Formulation: With patient Time For Goal Achievement: 03/19/20 Potential to Achieve Goals: Fair ADL Goals Pt Will Perform Grooming: with min assist;sitting Pt Will Perform Upper Body Bathing: with mod assist;sitting Pt Will Transfer to Toilet: with +2 assist;with max assist Additional ADL Goal #1: goal met Additional ADL Goal #2: Family will be independent with PROM/positioning Lt UE  Plan Discharge plan remains appropriate    Co-evaluation                 AM-PAC OT "6 Clicks" Daily Activity     Outcome Measure   Help from another person eating meals?: A Lot Help from another person taking care of personal grooming?: A Lot Help from another person toileting, which includes using toliet, bedpan, or urinal?: Total Help from another  person bathing (including washing, rinsing, drying)?: A Lot Help from another person to put on and taking off regular upper body clothing?: Total Help from another person to put on and taking off regular lower body clothing?: Total 6 Click Score: 9    End of Session    OT Visit Diagnosis: Hemiplegia and hemiparesis;Other symptoms and signs involving cognitive function;Apraxia (R48.2);Muscle weakness (generalized) (M62.81);Other abnormalities of gait and mobility (R26.89);Pain Hemiplegia - Right/Left: Left Hemiplegia - dominant/non-dominant: Non-Dominant Hemiplegia - caused by: Cerebral infarction Pain - part of body:  (generalized)   Activity Tolerance Patient tolerated treatment well   Patient Left in bed;with call bell/phone within reach;with bed alarm set   Nurse Communication Other (comment);Mobility status (encourage R sidelying as much as tolerated)        Time: 2567-2091 OT Time Calculation (min): 33 min  Charges: OT General Charges $OT Visit: 1 Visit OT Treatments $Self Care/Home Management : 8-22 mins $Neuromuscular Re-education: 8-22 mins  Maurie Boettcher, OT/L   Acute OT Clinical Specialist Acute Rehabilitation Services Pager [phone] Office [phone]    [facility] 03/17/2020, 2:12 PM

## 2020-03-17 NOTE — TOC Progression Note (Addendum)
Transition of Care [facility]) - Progression Note    Patient Details  Name: Amber Lopez MRN: [id number] Date of Birth: 06-02-1940  Transition of Care [facility]) CM/SW [place], [place] Phone Number: 03/17/2020, 1:55 PM  Clinical Narrative:     Pt daughter called CSW. She expressed frustration to CSW. She explained that family [facility]. Pt daughter is demanding that [facility] have their social worker find another placement for pt. CSW explained that their social worker may not be able to do that to which daughter explained she would just take pt home. CSW explained he would contact [facility] and provide daughter with timeline regarding d/c.  CSW updated Ebony Hail from [facility]. Ebony Hail states they may be able to accept pt on Friday 03/19/20. She will follow up with CSW.    1611: Auth started. ANV#[id number] Clinicals faxed  Expected Discharge Plan: [place] Barriers to Discharge: Continued Medical Work up, SNF Pending bed offer  Expected Discharge Plan and Services Expected Discharge Plan: [place] In-house Referral: Clinical Social Work Discharge Planning Services: CM Consult Post Acute Care Choice: [place] arrangements for the past 2 months: Single Family Home Expected Discharge Date: 03/17/20                                     Social Determinants of Health (SDOH) Interventions    Readmission Risk Interventions No flowsheet data found.

## 2020-03-17 NOTE — Progress Notes (Signed)
[facility] PROGRESS NOTE  Amber Lopez YCX:[id number] DOB: 1941-04-04 DOA: 01/29/2020 PCP: Billie Ruddy, MD     11/16   11/22 Now decannulated   Status: Remains inpatient appropriate because:Altered mental status and Unsafe d/c plan   Dispo: The patient is from: Home              Anticipated d/c is to: SNF vs home with family              Anticipated d/c date is: > 3 days              Patient currently is medically stable to d/c.   Code Status: DNR Family Communication: 11/15 spoke with daughter [name]; 11/23 and 11/24 LCSW spoke at length with patient's daughter regarding SNF bed offers.  Daughter not happy with the choices available and is hopeful for facility in the [place] area.  She reported to LCSW that if cannot locate appropriate SNF she will likely try to take her mother home. DVT prophylaxis: Lovenox Vaccination status: Patient completed both doses of Pfizer Covid vaccine on 09/01/2019  Foley catheter: No  HPI: 79 year old female with past medical history for HIV/AIDS, hypertension, dyslipidemia, diabetes, depression, arthritis and breast cancer.  Patient presented to the ER on 10/7 with strokelike symptoms consisting of left hemiplegia, facial droop, aphasia.  CTA head neck showed emergent LVO at the right M1 segment with patient subsequently undergoing TPA and revascularization by neurology and IR.  She was admitted to the ICU postprocedure on mechanical ventilation.  Unfortunately patient's mentation and neurological status remained poor.  She was not able to follow commands and she was unable to wean from the ventilator.  Tracheostomy tube was placed on 10/20 and a PEG tube was placed on 10/29.  Unfortunately her neurological status has not improved.  Other complications since admission include a large left pleural effusion/left apical mass with pathology assistant with metastatic adenocarcinoma.  She has been denied for LTAC placement.  Palliative care has  been discussing with patient family her poor prognosis and initially family requested patient be transferred to another facility but no clinical reason to transfer therefore patient remains facility.  Subjective: Patient awake and verbally responsive.  Speech at times nonsensical.   Objective: Vitals:   03/17/20 0814 03/17/20 1029  BP: (!) 140/52 (!) 129/55  Pulse: 85 75  Resp:    Temp:    SpO2:      Intake/Output Summary (Last 24 hours) at 03/17/2020 1153 Last data filed at 03/17/2020 0600 Gross per 24 hour  Intake 1060 ml  Output 1100 ml  Net -40 ml   Filed Weights   03/15/20 0459 03/16/20 0306 03/17/20 0540  Weight: 92 kg 90 kg 89.5 kg    Exam:  Constitutional: NAD, calm Respiratory: clear to auscultation bilaterally. Normal respiratory effort. RA Cardiovascular: Regular rate and rhythm, No lower extremity edema.  Extremities pink and warm with adequate capillary refill. Abdomen: Non tender, Bowel sounds PEG tube; tolerating D1 diet  Musculoskeletal:  No spasticity observed with movement Neurologic: CN 2-12 grossly intact.  Sensation intact. Left hemineglect persists- left side weaker than right. Psychiatric: Alert and oriented to name and place.  Confused and pleasant.   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 -OT recommended pillow splint at bedtime 2/2 ongoing hemineglect and increased risk for LUE contracture -Continue SLP (swallowing and speech/cognition) -Last PT evaluation on 11/22: Patient continues to require  max assist +2 for supine to sit.  Having difficulty sitting on edge of bed secondary to underlying air mattress.  Again requires max assist +2 for lateral scoot transfer performed from bed to chair.  Patient was able to follow basic commands.  Progressing slowly but engaged in therapy sessions.  Acute ventilatory dependent respiratory failure with  hypoxemia/Inability to clear secretions/requires tracheostomy tube/COPD -11/19 decannulated by trach team -Based on preadmission medications does not appear to have been on any short acting bronchodilators or LABA prior to admission  Type 2 diabetes mellitus -A1c 6.5 on 10/7. -11/22 CBGs trending up now that more consistent intake of D1 diet plus TF-Lantus increased to 18 units this past WE  Stage IV adenocarcinoma of the left lung with left pleural effusion  -CT of chest at time of admission: w/ large left pleural effusion with near complete left lung collapse. Left apical mass measuring 3.5 x 3.3 cm suspicious for bronchogenic carcinoma -Pleural fluid pathology consistent with metastatic adenocarcinoma -poor performance status precludes chemotherapy with metastatic disease; 11/15 reinforced this with patient's daughter by telephone -Needs to follow-up with oncology as an outpatient if improves clinically (was evaluated by Dr. Narda Rutherford during this hospitalization)  Dysphagia/ Nutrition Status: Nutrition Status: Nutrition Problem: Moderate Malnutrition Etiology: chronic illness (HIV) Signs/Symptoms: mild muscle depletion, moderate muscle depletion, mild fat depletion, moderate fat depletion Interventions: Ensure Enlive (each supplement provides 350kcal and 20 grams of protein), Tube feeding, MVI-patient also tolerating D1 diet when fed-decannulated so hopeful can progress diet soon; consideration given to transitioning to bolus tube feeds in the event daughter request to take patient home with her Estimated body mass index is 41.24 kg/m as calculated from the following:   Height as of this encounter: 4\' 10"  (1.473 m).   Weight as of this encounter: 89.5 kg.  Acute diarrhea -Resolved   Decubitus ulcer not POA Pressure Injury 02/16/20 Perineum Right Stage 2 -  Partial thickness loss of dermis presenting as a shallow open injury with a red, pink wound bed without slough. 2cm x 1cm pink  (Active)  Date First Assessed/Time First Assessed: 02/16/20 1200   Location: Perineum  Location Orientation: Right  Staging: Stage 2 -  Partial thickness loss of dermis presenting as a shallow open injury with a red, pink wound bed without slough.  Wound Descri...    Assessments 02/16/2020 12:00 PM 03/16/2020  8:00 PM  Dressing Type Moisture barrier Foam - Lift dressing to assess site every shift  Dressing -- Clean;Dry;Intact  Dressing Change Frequency -- Every 3 days  Site / Wound Assessment Clean;Pink --  Peri-wound Assessment Intact --  Wound Length (cm) 2 cm --  Wound Width (cm) 1 cm --  Wound Depth (cm) 0 cm --  Wound Surface Area (cm^2) 2 cm^2 --  Wound Volume (cm^3) 0 cm^3 --  Margins Unattached edges (unapproximated) --  Drainage Amount Scant None  Treatment Cleansed --     No Linked orders to display      Other problems: Hypertension/left ventricular diastolic dysfunction -Heart rate and blood pressure stable on Coreg. -No clinical signs consistent with heart failure  HIV/AIDS -Continue home medications including: atovaquone, dolutegravir and descovy; discussing with pharmacy and ID about transitioning to more affordable option since we are pursuing SNF care and expensive medications may be barrier to placement  Chronic normocytic anemia -Hemoglobin stable at 9.0  GOC  -Family had requested transfer to another facility when informed poor prognosis.  At this time no clinical reason to transfer  to other facility.  Palliative care following intermittently -LTAC denied -Case management assisting with SNF placement  History of breast cancer Was on Arimidex prior to admission  Data Reviewed: Basic Metabolic Panel: No results for input(s): NA, K, CL, CO2, GLUCOSE, BUN, CREATININE, CALCIUM, MG, PHOS in the last 168 hours. Liver Function Tests: No results for input(s): AST, ALT, ALKPHOS, BILITOT, PROT, ALBUMIN in the last 168 hours. No results for input(s): LIPASE,  AMYLASE in the last 168 hours. No results for input(s): AMMONIA in the last 168 hours. CBC: No results for input(s): WBC, NEUTROABS, HGB, HCT, MCV, PLT in the last 168 hours. Cardiac Enzymes: No results for input(s): CKTOTAL, CKMB, CKMBINDEX, TROPONINI in the last 168 hours. BNP (last 3 results) No results for input(s): BNP in the last 8760 hours.  ProBNP (last 3 results) Recent Labs    08/07/19 1422  PROBNP 110.0*    CBG: Recent Labs  Lab 03/16/20 1617 03/16/20 2005 03/16/20 2355 03/17/20 0358 03/17/20 0738  GLUCAP 242* 189* 198* 175* 189*    No results found for this or any previous visit (from the past 240 hour(s)).   Studies: No results found.  Scheduled Meds: . aspirin  81 mg Per Tube Daily  . atovaquone  1,500 mg Per Tube Q breakfast  . carvedilol  3.125 mg Per Tube BID WC  . chlorhexidine  15 mL Mouth Rinse BID  . Chlorhexidine Gluconate Cloth  6 each Topical Daily  . dicyclomine  10 mg Per Tube TID AC & HS  . dolutegravir  50 mg Per Tube Daily  . emtricitabine-tenofovir AF  1 tablet Per Tube Daily  . enoxaparin (LOVENOX) injection  40 mg Subcutaneous Q24H  . feeding supplement  237 mL Oral BID BM  . free water  200 mL Per Tube Q8H  . insulin aspart  0-15 Units Subcutaneous Q4H  . insulin detemir  18 Units Subcutaneous BID  . mouth rinse  15 mL Mouth Rinse q12n4p  . multivitamin  15 mL Per Tube Daily  . pantoprazole sodium  40 mg Per Tube QHS  . QUEtiapine  12.5 mg Per Tube QHS  . rosuvastatin  10 mg Per NG tube Daily   Continuous Infusions: . sodium chloride 250 mL (02/11/20 2212)  . sodium chloride Stopped (02/09/20 0556)  . feeding supplement (OSMOLITE 1.2 CAL) 60 mL/hr at 03/17/20 0400    Active Problems:   DNR (do not resuscitate) discussion   Acute right MCA stroke ([place])   Middle cerebral artery embolism, right   Malnutrition of moderate degree   Adenocarcinoma of left lung ([place])   Adenocarcinoma ([place])   Palliative care by specialist    Infiltrate noted on imaging study   Pressure injury of skin   Endotracheal tube present   Pleural effusion on left   Acute respiratory failure with hypoxia ([place])   Tracheostomy in place ([place])   Ventilator dependent ([place])   Oropharyngeal dysphagia   Consultants:  Neurology  Interventional radiology  PCCM  Oncology  Palliative care  Procedures:  Echocardiogram  EEG  Core track placement  Tracheostomy tube  PEG tube  Antibiotics: Anti-infectives (From admission, onward)   Start     Dose/Rate Route Frequency Ordered Stop   03/15/20 1000  emtricitabine-tenofovir AF (DESCOVY) 200-25 MG per tablet 1 tablet        1 tablet Per Tube Daily 03/15/20 0832     03/13/20 1000  dolutegravir (TIVICAY) tablet 50 mg  50 mg Per Tube Daily 03/13/20 0712     03/05/20 1000  dolutegravir (TIVICAY) tablet 50 mg  Status:  Discontinued        50 mg Oral Daily 03/04/20 1008 03/13/20 0712   03/05/20 1000  emtricitabine-tenofovir AF (DESCOVY) 200-25 MG per tablet 1 tablet  Status:  Discontinued        1 tablet Oral Daily 03/04/20 1008 03/15/20 0832   03/04/20 0930  atovaquone (MEPRON) 750 MG/5ML suspension 1,500 mg        1,500 mg Per Tube Daily with breakfast 03/04/20 0833     03/04/20 0800  Darunavir-Cobicisctat-Emtricitabine-Tenofovir Alafenamide (SYMTUZA) 800-150-200-10 MG TABS 1 tablet  Status:  Discontinued        1 tablet Oral Daily with breakfast 03/03/20 1345 03/04/20 1008   02/12/20 1000  vancomycin (VANCOREADY) IVPB 1250 mg/250 mL        1,250 mg 166.7 mL/hr over 90 Minutes Intravenous Every 24 hours 02/11/20 1509 02/18/20 1438   02/07/20 1310  vancomycin (VANCOREADY) IVPB 750 mg/150 mL  Status:  Discontinued        750 mg 150 mL/hr over 60 Minutes Intravenous Every 12 hours 02/07/20 1310 02/11/20 1509   02/06/20 0000  vancomycin (VANCOREADY) IVPB 500 mg/100 mL  Status:  Discontinued        500 mg 100 mL/hr over 60 Minutes Intravenous Every 12 hours 02/05/20 1141 02/07/20  1310   02/05/20 1300  ceFEPIme (MAXIPIME) 2 g in sodium chloride 0.9 % 100 mL IVPB        2 g 200 mL/hr over 30 Minutes Intravenous Every 12 hours 02/05/20 1127 02/11/20 2140   02/05/20 1300  atovaquone (MEPRON) 750 MG/5ML suspension 1,500 mg  Status:  Discontinued        1,500 mg Per Tube Daily with breakfast 02/05/20 1137 03/03/20 1345   02/05/20 1230  vancomycin (VANCOCIN) IVPB 1000 mg/200 mL premix        1,000 mg 200 mL/hr over 60 Minutes Intravenous  Once 02/05/20 1127 02/05/20 1304   01/30/20 1000  dolutegravir (TIVICAY) tablet 50 mg  Status:  Discontinued        50 mg Oral Daily 01/30/20 0941 03/03/20 1345   01/30/20 1000  emtricitabine-tenofovir AF (DESCOVY) 200-25 MG per tablet 1 tablet  Status:  Discontinued        1 tablet Per Tube Daily 01/30/20 0941 03/03/20 1345   01/29/20 1600  emtricitabine-tenofovir AF (DESCOVY) 200-25 MG per tablet 1 tablet  Status:  Discontinued        1 tablet Per Tube Daily 01/29/20 1437 01/29/20 1507   01/29/20 1600  dolutegravir (TIVICAY) tablet 50 mg  Status:  Discontinued        50 mg Oral Daily 01/29/20 1437 01/29/20 1507   01/29/20 1415  bictegravir-emtricitabine-tenofovir AF (BIKTARVY) 50-200-25 MG per tablet 1 tablet  Status:  Discontinued        1 tablet Oral Daily 01/29/20 1408 01/29/20 1437   01/29/20 1045  amoxicillin-clavulanate (AUGMENTIN) 875-125 MG per tablet 1 tablet  Status:  Discontinued        1 tablet Oral 2 times daily 01/29/20 1041 01/29/20 1510   01/29/20 1045  bictegravir-emtricitabine-tenofovir AF (BIKTARVY) 50-200-25 MG per tablet 1 tablet  Status:  Discontinued        1 tablet Oral Daily 01/29/20 1041 01/29/20 1042   01/29/20 0913  ceFAZolin (ANCEF) 2-4 GM/100ML-% IVPB       Note to Pharmacy: Tressie Stalker   : cabinet  override      01/29/20 0913 01/29/20 2114       Time spent: 20 minutes    Erin Hearing ANP  [facility] Pager [phone]. If 7PM-7AM, please contact night-coverage at [email or web address] 03/17/2020,  11:53 AM  LOS: 48 days

## 2020-03-17 NOTE — Progress Notes (Addendum)
Brief Nutrition Follow Up  RD received request to change current tube feeding regimen to bolus prior to discharge. Pt currently tolerating Osmolite 1.2 @ 60 ml/hr and consuming small portions of meals on DYS 1 diet.   Change tube feeding regimen:  -One ARC/can of Osmolite 1.5 five times daily  -Free water flushes 200 ml Q6 hours   Provides: 1775 kcals, 75 grams protein, 905 ml free water (1705 ml with flushes)  Continue tube feeding to meet 100% of needs until PO intake progresses and remains consistent.   Discontinue liquid MVI given high osmolality.   Mariana Single RD, LDN Clinical Nutrition Pager listed in [place]

## 2020-03-17 NOTE — Progress Notes (Signed)
TCT daughter [name] Swartz concerning patient is ready for discharge to SNF, she has 2 bed offers and she is medically stable for discharge. Daughter does not want to travel to [facility] to see the patient and the other accepting SNF has bad ratings. Explained to daughter at length concerning the process of bed search and she only has 2 bed offers. The search was expanded outside of [facility] and [facility] has accepted her.  Discharge order has been placed by Attending MD. Appeal process has been explained all questions answered. [name] stated that she will call her brother. Emotional support given. Aneta Mins Transition of Care Supervisor [phone]

## 2020-03-18 DIAGNOSIS — I63511 Cerebral infarction due to unspecified occlusion or stenosis of right middle cerebral artery: Secondary | ICD-10-CM | POA: Diagnosis not present

## 2020-03-18 DIAGNOSIS — J9601 Acute respiratory failure with hypoxia: Secondary | ICD-10-CM | POA: Diagnosis not present

## 2020-03-18 DIAGNOSIS — C801 Malignant (primary) neoplasm, unspecified: Secondary | ICD-10-CM | POA: Diagnosis not present

## 2020-03-18 DIAGNOSIS — C3492 Malignant neoplasm of unspecified part of left bronchus or lung: Secondary | ICD-10-CM | POA: Diagnosis not present

## 2020-03-18 LAB — BASIC METABOLIC PANEL
Anion gap: 12 (ref 5–15)
BUN: 26 mg/dL — ABNORMAL HIGH (ref 8–23)
CO2: 25 mmol/L (ref 22–32)
Calcium: 9 mg/dL (ref 8.9–10.3)
Chloride: 90 mmol/L — ABNORMAL LOW (ref 98–111)
Creatinine, Ser: 0.91 mg/dL (ref 0.44–1.00)
GFR, Estimated: >60 mL/min (ref >60)
Glucose, Bld: 124 mg/dL — ABNORMAL HIGH (ref 70–99)
Potassium: 4.8 mmol/L (ref 3.5–5.1)
Sodium: 127 mmol/L — ABNORMAL LOW (ref 135–145)

## 2020-03-18 LAB — GLUCOSE, CAPILLARY
Glucose-Capillary: 159 mg/dL — ABNORMAL HIGH (ref 70–99)
Glucose-Capillary: 115 mg/dL — ABNORMAL HIGH (ref 70–99)
Glucose-Capillary: 288 mg/dL — ABNORMAL HIGH (ref 70–99)
Glucose-Capillary: 144 mg/dL — ABNORMAL HIGH (ref 70–99)
Glucose-Capillary: 228 mg/dL — ABNORMAL HIGH (ref 70–99)
Glucose-Capillary: 305 mg/dL — ABNORMAL HIGH (ref 70–99)
Glucose-Capillary: 227 mg/dL — ABNORMAL HIGH (ref 70–99)

## 2020-03-18 MED ORDER — SODIUM CHLORIDE 0.9 % IV SOLN: INTRAVENOUS | Status: AC

## 2020-03-18 NOTE — TOC Progression Note (Signed)
Transition of Care [facility]) - Progression Note    Patient Details  Name: Amber Lopez MRN: [id number] Date of Birth: 04/05/1941  Transition of Care [facility]) CM/SW [place], [place] Phone Number: 03/18/2020, 9:09 AM  Clinical Narrative:    CSW received text from pt's daughter requesting that CSW check with [place] or [place] facilities for open beds. CSW advised that due to the holiday, the facilities could not be contacted, but will follow up first thing in the morning and be in contact. SW will continue to follow for DC planning.   Expected Discharge Plan: [place] Barriers to Discharge: Continued Medical Work up, SNF Pending bed offer  Expected Discharge Plan and Services Expected Discharge Plan: [place] In-house Referral: Clinical Social Work Discharge Planning Services: CM Consult Post Acute Care Choice: [place] arrangements for the past 2 months: Single Family Home Expected Discharge Date: 03/17/20                                     Social Determinants of Health (SDOH) Interventions    Readmission Risk Interventions No flowsheet data found.

## 2020-03-18 NOTE — Progress Notes (Signed)
PROGRESS NOTE    Amber Lopez  TDV:[id number] DOB: 12/22/1940 DOA: 01/29/2020 PCP: Billie Ruddy, MD   Brief Narrative:  79 year old female with past medical history of HIV/AIDS, HTN, HLD, diabetes, depression, osteoarthritis, breast cancer came to the ER initially on 10/11 with strokelike symptoms-left hemiplegia, left facial droop and aphasia.  CTA head and neck showed large vessel occlusion requiring TPA and emergent revascularization.  She was initially admitted to the ICU postprocedure on mechanical ventilation.  Unfortunately patient mentation continued to decline with poor neurologic status unable to follow commands and difficulty wean off ventilator.  She had to be trached on 10/20 and PEG tube placed on 10/29.  Hospital course also complicated by large left-sided pleural effusion/left-sided apical mass which was consistent with metastatic adenocarcinoma.  She has been denied LTAC placement.  Palliative care discussion with family regarding her poor prognosis.  Family requested patient to be transferred to their facility but no clinical reason was deemed for this transfer therefore remains here.  TOC team working with family regarding placement.   Assessment & Plan:   Active Problems:   DNR (do not resuscitate) discussion   Acute right MCA stroke ([place])   Middle cerebral artery embolism, right   Malnutrition of moderate degree   Adenocarcinoma of left lung (HCC)   Adenocarcinoma ([place])   Palliative care by specialist   Infiltrate noted on imaging study   Pressure injury of skin   Endotracheal tube present   Pleural effusion on left   Acute respiratory failure with hypoxia ([place])   Tracheostomy in place ([place])   Ventilator dependent ([place])   Oropharyngeal dysphagia   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/left hemineglect -Status post TPA and revascularization. -OT recommended pillow splint at bedtime 2/2 ongoing hemineglect and increased risk for LUE  contracture -Continue SLP (swallowing and speech/cognition) -Last PT evaluation on 11/22: Patient continues to require max assist +2 for supine to sit.  Having difficulty sitting on edge of bed secondary to underlying air mattress.  Again requires max assist +2 for lateral scoot transfer performed from bed to chair.  Patient was able to follow basic commands.  Progressing slowly but engaged in therapy sessions.  Slight hypochloremia/hyponatremia-gentle hydration for 4 hours with normal saline  Acute ventilatory dependent respiratory failure with hypoxemia/Inability to clear secretions/requires tracheostomy tube/COPD -11/19 decannulated by trach team -Based on preadmission medications does not appear to have been on any short acting bronchodilators or LABA prior to admission  Type 2 diabetes mellitus -A1c 6.5 on 10/7. -11/22 CBGs trending up now that more consistent intake of D1 diet plus TF-Lantus increased to 18 units  Stage IV adenocarcinoma of the left lung with left pleural effusion  -CT of chest at time of admission: w/ large left pleural effusion with near complete left lung collapse. Left apical mass measuring 3.5 x 3.3 cm suspicious for bronchogenic carcinoma -Pleural fluid pathology consistent with metastatic adenocarcinoma -poor performance status precludes chemotherapy with metastatic disease; 11/15 reinforced this with patient's daughter by telephone -Needs to follow-up with oncology as an outpatient if improves clinically (was evaluated by Dr. Narda Rutherford during this hospitalization)  Dysphagia/ Nutrition Status: Nutrition Status: Nutrition Problem: Moderate Malnutrition Etiology: chronic illness (HIV) Signs/Symptoms: mild muscle depletion, moderate muscle depletion, mild fat depletion, moderate fat depletion Interventions: Ensure Enlive (each supplement provides 350kcal and 20 grams of protein), Tube feeding, MVI-patient also tolerating D1 diet when fed-decannulated so  hopeful can progress diet soon; consideration given to transitioning to  bolus tube feeds in the event daughter request to take patient home with her Estimated body mass index is 41.24 kg/m as calculated from the following:   Height as of this encounter: 4\' 10"  (1.473 m).   Weight as of this encounter: 89.5 kg.  Acute diarrhea -Resolved  Hypertension/left ventricular diastolic dysfunction -Heart rate and blood pressure stable on Coreg. -No clinical signs consistent with heart failure  HIV/AIDS -Continue home medications including: atovaquone, dolutegravir and descovy; discussing with pharmacy and ID about transitioning to more affordable option since we are pursuing SNF care and expensive medications may be barrier to placement  Chronic normocytic anemia -Hemoglobin stable at 9.0  GOC  -Family had requested transfer to another facility when informed poor prognosis. At this time no clinical reason to transfer to other facility. Palliative care following intermittently -LTAC denied -Case management assisting with SNF placement  History of breast cancer Was on Arimidex prior to admission  She has a sacral/perineal right stage II ulcer-partial thickness loss of dermis presenting with shallow open injury with red/pink wound bed without sludge.  2 cm X 1 cm.  DVT prophylaxis: Lovenox Code Status: DNR Family Communication:    Status is: Inpatient  Remains inpatient appropriate because:Unsafe d/c plan   Dispo: The patient is from: Home              Anticipated d/c is to: SNF              Anticipated d/c date is: > 3 days              Patient currently is medically stable to d/c.  Currently awaiting placement       Body mass index is 41.24 kg/m.  Pressure Injury 02/16/20 Perineum Right Stage 2 -  Partial thickness loss of dermis presenting as a shallow open injury with a red, pink wound bed without slough. 2cm x 1cm pink (Active)  02/16/20 1200  Location: Perineum   Location Orientation: Right  Staging: Stage 2 -  Partial thickness loss of dermis presenting as a shallow open injury with a red, pink wound bed without slough.  Wound Description (Comments): 2cm x 1cm pink  Present on Admission: No      Subjective: Laying down does not appear to be any distress.  Minimal to no interaction.   Examination:  Constitutional: Chronically ill and frail.  Trach in place.  Does not participate in any kind of conversation. Respiratory: Mild anterior rhonchorous breath sounds Cardiovascular: Normal sinus rhythm, no rubs Abdomen: Nontender nondistended good bowel sounds Musculoskeletal: No edema noted Skin: No rashes seen Neurologic: Unable to assess Psychiatric: Unable to assess    Objective: Vitals:   03/17/20 1638 03/17/20 2009 03/17/20 2325 03/18/20 0335  BP: (!) 107/53     Pulse: 76 87 85 80  Resp:      Temp:  98.7 F (37.1 C) 98.2 F (36.8 C) 98.1 F (36.7 C)  TempSrc:  Axillary Axillary Axillary  SpO2:      Weight:      Height:        Intake/Output Summary (Last 24 hours) at 03/18/2020 0733 Last data filed at 03/17/2020 2320 Gross per 24 hour  Intake --  Output 950 ml  Net -950 ml   Filed Weights   03/15/20 0459 03/16/20 0306 03/17/20 0540  Weight: 92 kg 90 kg 89.5 kg     Data Reviewed:   CBC: No results for input(s): WBC, NEUTROABS, HGB, HCT, MCV, PLT in the  last 168 hours. Basic Metabolic Panel: Recent Labs  Lab 03/18/20 0255  NA 127*  K 4.8  CL 90*  CO2 25  GLUCOSE 124*  BUN 26*  CREATININE 0.91  CALCIUM 9.0   GFR: Estimated Creatinine Clearance: 47.7 mL/min (by C-G formula based on SCr of 0.91 mg/dL). Liver Function Tests: No results for input(s): AST, ALT, ALKPHOS, BILITOT, PROT, ALBUMIN in the last 168 hours. No results for input(s): LIPASE, AMYLASE in the last 168 hours. No results for input(s): AMMONIA in the last 168 hours. Coagulation Profile: No results for input(s): INR, PROTIME in the last 168  hours. Cardiac Enzymes: No results for input(s): CKTOTAL, CKMB, CKMBINDEX, TROPONINI in the last 168 hours. BNP (last 3 results) Recent Labs    08/07/19 1422  PROBNP 110.0*   HbA1C: No results for input(s): HGBA1C in the last 72 hours. CBG: Recent Labs  Lab 03/17/20 1227 03/17/20 1524 03/17/20 2326 03/18/20 0126 03/18/20 0343  GLUCAP 209* 159* 143* 159* 115*   Lipid Profile: No results for input(s): CHOL, HDL, LDLCALC, TRIG, CHOLHDL, LDLDIRECT in the last 72 hours. Thyroid Function Tests: No results for input(s): TSH, T4TOTAL, FREET4, T3FREE, THYROIDAB in the last 72 hours. Anemia Panel: No results for input(s): VITAMINB12, FOLATE, FERRITIN, TIBC, IRON, RETICCTPCT in the last 72 hours. Sepsis Labs: No results for input(s): PROCALCITON, LATICACIDVEN in the last 168 hours.  No results found for this or any previous visit (from the past 240 hour(s)).       Radiology Studies: No results found.      Scheduled Meds: . aspirin  81 mg Per Tube Daily  . atovaquone  1,500 mg Per Tube Q breakfast  . carvedilol  3.125 mg Per Tube BID WC  . chlorhexidine  15 mL Mouth Rinse BID  . Chlorhexidine Gluconate Cloth  6 each Topical Daily  . dicyclomine  10 mg Per Tube TID AC & HS  . dolutegravir  50 mg Per Tube Daily  . emtricitabine-tenofovir AF  1 tablet Per Tube Daily  . enoxaparin (LOVENOX) injection  40 mg Subcutaneous Q24H  . feeding supplement  237 mL Oral BID BM  . feeding supplement (OSMOLITE 1.5 CAL)  237 mL Per Tube 5 X Daily  . free water  200 mL Per Tube Q6H  . insulin aspart  0-15 Units Subcutaneous Q4H  . insulin detemir  18 Units Subcutaneous BID  . mouth rinse  15 mL Mouth Rinse q12n4p  . pantoprazole sodium  40 mg Per Tube QHS  . QUEtiapine  12.5 mg Per Tube QHS  . rosuvastatin  10 mg Per NG tube Daily   Continuous Infusions: . sodium chloride 250 mL (02/11/20 2212)  . sodium chloride Stopped (02/09/20 0556)     LOS: 49 days   Time spent= 15  mins    Elwood Bazinet Arsenio Loader, MD [facility]  If 7PM-7AM, please contact night-coverage  03/18/2020, 7:33 AM

## 2020-03-19 ENCOUNTER — Other Ambulatory Visit (HOSPITAL_COMMUNITY): Payer: Self-pay | Admitting: Internal Medicine

## 2020-03-19 DIAGNOSIS — C3492 Malignant neoplasm of unspecified part of left bronchus or lung: Secondary | ICD-10-CM | POA: Diagnosis not present

## 2020-03-19 DIAGNOSIS — I63511 Cerebral infarction due to unspecified occlusion or stenosis of right middle cerebral artery: Secondary | ICD-10-CM | POA: Diagnosis not present

## 2020-03-19 DIAGNOSIS — J9601 Acute respiratory failure with hypoxia: Secondary | ICD-10-CM | POA: Diagnosis not present

## 2020-03-19 DIAGNOSIS — C801 Malignant (primary) neoplasm, unspecified: Secondary | ICD-10-CM | POA: Diagnosis not present

## 2020-03-19 DIAGNOSIS — R338 Other retention of urine: Secondary | ICD-10-CM

## 2020-03-19 LAB — RESP PANEL BY RT-PCR (FLU A&B, COVID) ARPGX2
Influenza A by PCR: NEGATIVE
Influenza B by PCR: NEGATIVE
SARS Coronavirus 2 by RT PCR: NEGATIVE

## 2020-03-19 LAB — BASIC METABOLIC PANEL
Anion gap: 11 (ref 5–15)
BUN: 24 mg/dL — ABNORMAL HIGH (ref 8–23)
CO2: 25 mmol/L (ref 22–32)
Calcium: 9 mg/dL (ref 8.9–10.3)
Chloride: 96 mmol/L — ABNORMAL LOW (ref 98–111)
Creatinine, Ser: 0.92 mg/dL (ref 0.44–1.00)
GFR, Estimated: >60 mL/min (ref >60)
Glucose, Bld: 63 mg/dL — ABNORMAL LOW (ref 70–99)
Potassium: 5.1 mmol/L (ref 3.5–5.1)
Sodium: 132 mmol/L — ABNORMAL LOW (ref 135–145)

## 2020-03-19 LAB — GLUCOSE, CAPILLARY
Glucose-Capillary: 59 mg/dL — ABNORMAL LOW (ref 70–99)
Glucose-Capillary: 157 mg/dL — ABNORMAL HIGH (ref 70–99)
Glucose-Capillary: 202 mg/dL — ABNORMAL HIGH (ref 70–99)
Glucose-Capillary: 92 mg/dL (ref 70–99)
Glucose-Capillary: 206 mg/dL — ABNORMAL HIGH (ref 70–99)
Glucose-Capillary: 259 mg/dL — ABNORMAL HIGH (ref 70–99)

## 2020-03-19 MED ORDER — INSULIN DETEMIR 100 UNIT/ML ~~LOC~~ SOLN
20.0000 [IU] | Freq: Two times a day (BID) | SUBCUTANEOUS | Status: DC
  Administered 2020-03-19 – 2020-03-20 (×3): 20 [IU] via SUBCUTANEOUS
  Filled 2020-03-19 (×5): qty 0.2

## 2020-03-19 MED ORDER — EMTRICITABINE-TENOFOVIR AF 200-25 MG PO TABS: 1.0000 | ORAL_TABLET | Freq: Every day | ORAL | 0 refills | Status: DC

## 2020-03-19 MED ORDER — DOLUTEGRAVIR SODIUM 50 MG PO TABS
50.0000 mg | ORAL_TABLET | Freq: Every day | ORAL | 0 refills | Status: DC
Start: 2020-03-19 — End: 2020-03-20

## 2020-03-19 MED ORDER — DOLUTEGRAVIR SODIUM 50 MG PO TABS
50.0000 mg | ORAL_TABLET | Freq: Every day | ORAL | 0 refills | Status: DC
Start: 2020-03-19 — End: 2020-03-19

## 2020-03-19 MED ORDER — EMTRICITABINE-TENOFOVIR AF 200-25 MG PO TABS: 1.0000 | ORAL_TABLET | Freq: Every day | ORAL | 0 refills | Status: AC

## 2020-03-19 MED FILL — TIVICAY 50 MG TABLET: 50 | 30 days supply | Qty: 30 | Fill #0

## 2020-03-19 MED FILL — DESCOVY 200-25 MG TABS: 200-25 | 30 days supply | Qty: 30 | Fill #0

## 2020-03-19 NOTE — Progress Notes (Signed)
Received a call from [place] that patient appeared to have experiences a second degree heart block this morning for three seconds. Dr Reesa Chew notified

## 2020-03-19 NOTE — Progress Notes (Signed)
Auth received for [facility]. MDY#[id number]  Approved from 03/19/20 - 03/23/20.

## 2020-03-19 NOTE — TOC Progression Note (Signed)
Transition of Care [facility]) - Progression Note    Patient Details  Name: Amber Lopez MRN: [id number] Date of Birth: 09-25-1940  [facility] CM/SW [place], [place] Phone Number: 03/19/2020, 5:15 PM  Clinical Narrative:    CSW spoke with facility, they are able to take pt but she will need to take her HIV medications with her. The dr. Was asked to order them to [facility] and a coupon was used. This info was given to pharmacy, but a copy needs to be printed and given to the dtr. Facility also noted that [employer] was missing documents, they were sent, but not received. A 30 day note was put on the chart for the dr. To sign, Clinicals will be faxed tomorrow. Insurance Josem Kaufmann is good through the 30th so it should be fine for Monday DC. [facility] noted that they are able to offer osmolite 1.5 and Jevity 1.2, if the pt is prescribed anything else, a four day supply will need to be sent with her. CSW will speak with pt dtr tomorrow and make sure everything is in place for Monday DC. SW will continue to follow.   Expected Discharge Plan: [place] Barriers to Discharge: Continued Medical Work up, SNF Pending bed offer  Expected Discharge Plan and Services Expected Discharge Plan: [place] In-house Referral: Clinical Social Work Discharge Planning Services: CM Consult Post Acute Care Choice: [place] arrangements for the past 2 months: Single Family Home Expected Discharge Date: 03/17/20                                     Social Determinants of Health (SDOH) Interventions    Readmission Risk Interventions No flowsheet data found.

## 2020-03-19 NOTE — Progress Notes (Addendum)
PROGRESS NOTE    Amber Lopez  VOH:[id number] DOB: [birth year] DOA: 01/29/2020 PCP: Billie Ruddy, MD   Brief Narrative:  79 year old female with past medical history of HIV/AIDS, HTN, HLD, diabetes, depression, osteoarthritis, breast cancer came to the ER initially on 10/11 with strokelike symptoms-left hemiplegia, left facial droop and aphasia.  CTA head and neck showed large vessel occlusion requiring TPA and emergent revascularization.  She was initially admitted to the ICU postprocedure on mechanical ventilation.  Unfortunately patient mentation continued to decline with poor neurologic status unable to follow commands and difficulty wean off ventilator.  She had to be trached on 10/20 and PEG tube placed on 10/29.  Hospital course also complicated by large left-sided pleural effusion/left-sided apical mass which was consistent with metastatic adenocarcinoma.  She has been denied LTAC placement.  Palliative care discussion with family regarding her poor prognosis.  Family requested patient to be transferred to their facility but no clinical reason was deemed for this transfer therefore remains here.  TOC team working with family regarding placement.   Assessment & Plan:   Active Problems:   DNR (do not resuscitate) discussion   Acute right MCA stroke ([place])   Middle cerebral artery embolism, right   Malnutrition of moderate degree   Adenocarcinoma of left lung (HCC)   Adenocarcinoma ([place])   Palliative care by specialist   Infiltrate noted on imaging study   Pressure injury of skin   Endotracheal tube present   Pleural effusion on left   Acute respiratory failure with hypoxia ([place])   Tracheostomy in place ([place])   Ventilator dependent ([place])   Oropharyngeal dysphagia   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/left hemineglect -Status post TPA and revascularization. -OT recommended pillow splint at bedtime 2/2 ongoing hemineglect and increased risk for LUE  contracture -Continue SLP (swallowing and speech/cognition) -Last PT evaluation on 11/22: Patient continues to require max assist +2 for supine to sit.  Having difficulty sitting on edge of bed secondary to underlying air mattress.  Again requires max assist +2 for lateral scoot transfer performed from bed to chair.  Patient was able to follow basic commands.  Progressing slowly but engaged in therapy sessions.  Slight hypochloremia/hyponatremia-improved with hydration   Acute ventilatory dependent respiratory failure with hypoxemia/Inability to clear secretions/requires tracheostomy tube/COPD -11/19 decannulated by trach team -Based on preadmission medications does not appear to have been on any short acting bronchodilators or LABA prior to admission  Type 2 diabetes mellitus -A1c 6.5 on 10/7. -11/22 CBGs trending up now that more consistent intake of D1 diet plus TF-Lantus increased to 20 units  Stage IV adenocarcinoma of the left lung with left pleural effusion  -CT of chest at time of admission: w/ large left pleural effusion with near complete left lung collapse. Left apical mass measuring 3.5 x 3.3 cm suspicious for bronchogenic carcinoma -Pleural fluid pathology consistent with metastatic adenocarcinoma -poor performance status precludes chemotherapy with metastatic disease; 11/15 reinforced this with patient's daughter by telephone -Needs to follow-up with oncology as an outpatient if improves clinically (was evaluated by Dr. Narda Rutherford during this hospitalization)  Dysphagia/ Nutrition Status: Nutrition Status: Nutrition Problem: Moderate Malnutrition Etiology: chronic illness (HIV) Signs/Symptoms: mild muscle depletion, moderate muscle depletion, mild fat depletion, moderate fat depletion Interventions: Ensure Enlive (each supplement provides 350kcal and 20 grams of protein), Tube feeding, MVI-patient also tolerating D1 diet when fed-decannulated so hopeful can progress diet  soon; consideration given to transitioning to bolus tube feeds in  the event daughter request to take patient home with her Estimated body mass index is 41.24 kg/m as calculated from the following:   Height as of this encounter: 4\' 10"  (1.473 m).   Weight as of this encounter: 89.5 kg.  Acute diarrhea -Resolved  Hypertension/left ventricular diastolic dysfunction -Heart rate and blood pressure stable on Coreg. -No clinical signs consistent with heart failure  HIV/AIDS -Continue home medications including: atovaquone, dolutegravir and descovy; discussing with pharmacy and ID about transitioning to more affordable option since we are pursuing SNF care and expensive medications may be barrier to placement  Chronic normocytic anemia -Hemoglobin stable at 9.0  GOC  -Family had requested transfer to another facility when informed poor prognosis. At this time no clinical reason to transfer to other facility. Palliative care following intermittently -LTAC denied -Case management assisting with SNF placement  History of breast cancer Was on Arimidex prior to admission  She has a sacral/perineal right stage II ulcer-partial thickness loss of dermis presenting with shallow open injury with red/pink wound bed without sludge.  2 cm X 1 cm.  DVT prophylaxis: Lovenox Code Status: DNR Family Communication:    Status is: Inpatient  Remains inpatient appropriate because:Unsafe d/c plan   Dispo: The patient is from: Home              Anticipated d/c is to: SNF              Anticipated d/c date is: > 3 days              Patient currently is medically stable to d/c.  Currently awaiting placement       Body mass index is 41.24 kg/m.  Pressure Injury 02/16/20 Perineum Right Stage 2 -  Partial thickness loss of dermis presenting as a shallow open injury with a red, pink wound bed without slough. 2cm x 1cm pink (Active)  02/16/20 1200  Location: Perineum  Location Orientation:  Right  Staging: Stage 2 -  Partial thickness loss of dermis presenting as a shallow open injury with a red, pink wound bed without slough.  Wound Description (Comments): 2cm x 1cm pink  Present on Admission: No      Subjective: Laying in the bed without any complaints.  No meaningful interaction.    Examination:  Constitutional: Chronically ill and frail, unable to obtain any meaningful information Respiratory: Clear to auscultation bilaterally Cardiovascular: Normal sinus rhythm, no rubs Abdomen: Nontender nondistended good bowel sounds Musculoskeletal: No edema noted Skin: No rashes seen Neurologic: Unable to assess Psychiatric: Unable to assess    Objective: Vitals:   03/18/20 1741 03/18/20 1936 03/18/20 2336 03/19/20 0431  BP: (!) 135/56 126/60 120/60 139/66  Pulse:      Resp:  17 (!) 21 19  Temp:  98 F (36.7 C) (!) 97.5 F (36.4 C) 97.7 F (36.5 C)  TempSrc:  Axillary Oral Oral  SpO2:  99%  99%  Weight:      Height:        Intake/Output Summary (Last 24 hours) at 03/19/2020 1018 Last data filed at 03/19/2020 0600 Gross per 24 hour  Intake 1420 ml  Output 200 ml  Net 1220 ml   Filed Weights   03/15/20 0459 03/16/20 0306 03/17/20 0540  Weight: 92 kg 90 kg 89.5 kg     Data Reviewed:   CBC: No results for input(s): WBC, NEUTROABS, HGB, HCT, MCV, PLT in the last 168 hours. Basic Metabolic Panel: Recent Labs  Lab 03/18/20  0255 03/19/20 0412  NA 127* 132*  K 4.8 5.1  CL 90* 96*  CO2 25 25  GLUCOSE 124* 63*  BUN 26* 24*  CREATININE 0.91 0.92  CALCIUM 9.0 9.0   GFR: Estimated Creatinine Clearance: 47.2 mL/min (by C-G formula based on SCr of 0.92 mg/dL). Liver Function Tests: No results for input(s): AST, ALT, ALKPHOS, BILITOT, PROT, ALBUMIN in the last 168 hours. No results for input(s): LIPASE, AMYLASE in the last 168 hours. No results for input(s): AMMONIA in the last 168 hours. Coagulation Profile: No results for input(s): INR, PROTIME in  the last 168 hours. Cardiac Enzymes: No results for input(s): CKTOTAL, CKMB, CKMBINDEX, TROPONINI in the last 168 hours. BNP (last 3 results) Recent Labs    08/07/19 1422  PROBNP 110.0*   HbA1C: No results for input(s): HGBA1C in the last 72 hours. CBG: Recent Labs  Lab 03/18/20 2023 03/18/20 2329 03/19/20 0445 03/19/20 0553 03/19/20 0754  GLUCAP 305* 227* 59* 157* 202*   Lipid Profile: No results for input(s): CHOL, HDL, LDLCALC, TRIG, CHOLHDL, LDLDIRECT in the last 72 hours. Thyroid Function Tests: No results for input(s): TSH, T4TOTAL, FREET4, T3FREE, THYROIDAB in the last 72 hours. Anemia Panel: No results for input(s): VITAMINB12, FOLATE, FERRITIN, TIBC, IRON, RETICCTPCT in the last 72 hours. Sepsis Labs: No results for input(s): PROCALCITON, LATICACIDVEN in the last 168 hours.  No results found for this or any previous visit (from the past 240 hour(s)).       Radiology Studies: No results found.      Scheduled Meds: . aspirin  81 mg Per Tube Daily  . atovaquone  1,500 mg Per Tube Q breakfast  . carvedilol  3.125 mg Per Tube BID WC  . chlorhexidine  15 mL Mouth Rinse BID  . Chlorhexidine Gluconate Cloth  6 each Topical Daily  . dicyclomine  10 mg Per Tube TID AC & HS  . dolutegravir  50 mg Per Tube Daily  . emtricitabine-tenofovir AF  1 tablet Per Tube Daily  . enoxaparin (LOVENOX) injection  40 mg Subcutaneous Q24H  . feeding supplement  237 mL Oral BID BM  . feeding supplement (OSMOLITE 1.5 CAL)  237 mL Per Tube 5 X Daily  . free water  200 mL Per Tube Q6H  . insulin aspart  0-15 Units Subcutaneous Q4H  . insulin detemir  18 Units Subcutaneous BID  . mouth rinse  15 mL Mouth Rinse q12n4p  . pantoprazole sodium  40 mg Per Tube QHS  . QUEtiapine  12.5 mg Per Tube QHS  . rosuvastatin  10 mg Per NG tube Daily   Continuous Infusions: . sodium chloride 250 mL (02/11/20 2212)  . sodium chloride Stopped (02/09/20 0556)     LOS: 50 days   Time  spent= 15 mins    Amber Meikle Arsenio Loader, MD [facility]  If 7PM-7AM, please contact night-coverage  03/19/2020, 10:18 AM

## 2020-03-19 NOTE — Social Work (Signed)
RE:  Date of Birth:  Date:  To Whom It May Concern:  Please be advised that the above-named patient will require a short-term nursing home stay - anticipated 30 days or less for rehabilitation and strengthening. The plan is for return home.  MD signature

## 2020-03-20 ENCOUNTER — Other Ambulatory Visit (HOSPITAL_COMMUNITY): Payer: Self-pay | Admitting: Internal Medicine

## 2020-03-20 DIAGNOSIS — I63511 Cerebral infarction due to unspecified occlusion or stenosis of right middle cerebral artery: Secondary | ICD-10-CM | POA: Diagnosis not present

## 2020-03-20 DIAGNOSIS — C3492 Malignant neoplasm of unspecified part of left bronchus or lung: Secondary | ICD-10-CM | POA: Diagnosis not present

## 2020-03-20 DIAGNOSIS — J9601 Acute respiratory failure with hypoxia: Secondary | ICD-10-CM | POA: Diagnosis not present

## 2020-03-20 DIAGNOSIS — C801 Malignant (primary) neoplasm, unspecified: Secondary | ICD-10-CM | POA: Diagnosis not present

## 2020-03-20 LAB — BASIC METABOLIC PANEL
Anion gap: 8 (ref 5–15)
BUN: 24 mg/dL — ABNORMAL HIGH (ref 8–23)
CO2: 27 mmol/L (ref 22–32)
Calcium: 8.9 mg/dL (ref 8.9–10.3)
Chloride: 95 mmol/L — ABNORMAL LOW (ref 98–111)
Creatinine, Ser: 0.87 mg/dL (ref 0.44–1.00)
GFR, Estimated: >60 mL/min (ref >60)
Glucose, Bld: 54 mg/dL — ABNORMAL LOW (ref 70–99)
Potassium: 5.7 mmol/L — ABNORMAL HIGH (ref 3.5–5.1)
Sodium: 130 mmol/L — ABNORMAL LOW (ref 135–145)

## 2020-03-20 LAB — GLUCOSE, CAPILLARY
Glucose-Capillary: 170 mg/dL — ABNORMAL HIGH (ref 70–99)
Glucose-Capillary: 186 mg/dL — ABNORMAL HIGH (ref 70–99)
Glucose-Capillary: 221 mg/dL — ABNORMAL HIGH (ref 70–99)
Glucose-Capillary: 241 mg/dL — ABNORMAL HIGH (ref 70–99)
Glucose-Capillary: 277 mg/dL — ABNORMAL HIGH (ref 70–99)
Glucose-Capillary: 53 mg/dL — ABNORMAL LOW (ref 70–99)
Glucose-Capillary: 97 mg/dL (ref 70–99)

## 2020-03-20 MED ORDER — ACETAMINOPHEN 325 MG PO TABS: 650.0000 mg | ORAL_TABLET | ORAL | Status: AC | PRN

## 2020-03-20 MED ORDER — DOLUTEGRAVIR SODIUM 50 MG PO TABS: 50.0000 mg | ORAL_TABLET | Freq: Every day | ORAL | 0 refills | Status: AC

## 2020-03-20 MED ORDER — ANASTROZOLE 1 MG PO TABS: 1.0000 mg | ORAL_TABLET | Freq: Every day | ORAL | Status: AC

## 2020-03-20 NOTE — Discharge Summary (Addendum)
Physician Discharge Summary  DELOROS BERETTA UUV:[id number] DOB: 30-Dec-1940 DOA: 01/29/2020  PCP: Billie Ruddy, MD  Admit date: 01/29/2020 Discharge date: 03/22/2020  Admitted From: Home Disposition: SNF  Recommendations for Outpatient Follow-up:  1. Follow up with PCP in 1-2 weeks 2. Please obtain BMP/CBC in one week your next doctors visit.  3. Arrangements for her HIV medications have been made, daughter to pick up prescriptions 4. Closely monitor her blood glucose level, Accu-Cheks 3 times daily and before bedtime 5. Continue daily Levemir 20 units and adjust as necessary 6. Follow-up outpatient oncology if she clinically improves for her stage IV adenocarcinoma of the left lung 7. Aggressive use of incentive spirometer.  Also needs to have at least 1 bowel movement daily 8. Recommend encouraging family to have palliative care team follow her. 9. Okay to give all of her medications per tube including her HIV medications.  Approved by ID pharmacist at [facility].  Discharge Condition: Stable CODE STATUS: DNR Diet recommendation: Dysphagia 1  Brief/Interim Summary: 79 year old female with past medical history of HIV/AIDS, HTN, HLD, diabetes, depression, osteoarthritis, breast cancer came to the ER initially on 10/11 with strokelike symptoms-left hemiplegia, left facial droop and aphasia.  CTA head and neck showed large vessel occlusion requiring TPA and emergent revascularization.  She was initially admitted to the ICU postprocedure on mechanical ventilation.  Unfortunately patient mentation continued to decline with poor neurologic status unable to follow commands and difficulty wean off ventilator.  She had to be trached on 10/20 and PEG tube placed on 10/29.  Hospital course also complicated by large left-sided pleural effusion/left-sided apical mass which was consistent with metastatic adenocarcinoma.  She has been denied LTAC placement.  Palliative care discussion with family  regarding her poor prognosis.  Family requested patient to be transferred to their facility but no clinical reason was deemed for this transfer therefore remains here.  TOC team working with family regarding placement.  Eventually she was placed at skilled nursing facility.     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/left hemineglect -Status post TPA and revascularization. -OT recommended pillow splint at bedtime 2/2 ongoing hemineglect and increased risk for LUE contracture -Continue SLP (swallowing and speech/cognition) -Last PT evaluation on 11/22: Patient continues to require max assist +2 for supine to sit. Having difficulty sitting on edge of bed secondary to underlying air mattress. Again requires max assist +2 for lateral scoot transfer performed from bed to chair. Patient was able to follow basic commands. Progressing slowly but engaged in therapy sessions.  Slight hypochloremia/hyponatremia-improved with hydration.  Monitor urine output closely.   Acute ventilatory dependent respiratory failure with hypoxemia/Inability to clear secretions/requires tracheostomy tube/COPD -11/19 decannulated by trach team -Based on preadmission medications does not appear to have been on any short acting bronchodilators orLABAprior to admission  Type 2 diabetes mellitus -A1c 6.5 on 10/7. -11/22 CBGs trending up now that more consistent intake of D1 diet plus TF-continue daily Levemir  Stage IV adenocarcinoma of the left lung with left pleural effusion  -CT of chest at time of admission: w/ large left pleural effusion with near complete left lung collapse. Left apical mass measuring 3.5 x 3.3 cm suspicious for bronchogenic carcinoma -Pleural fluid pathology consistent with metastatic adenocarcinoma -poor performance status precludes chemotherapy with metastatic disease; 11/15 reinforced this with patient's daughter by telephone -Needs to follow-up with oncology as an  outpatient if improves clinically (was evaluated by Dr. Narda Rutherford during this hospitalization)  Dysphagia/  Nutrition Status: Nutrition Status: Nutrition Problem: Moderate Malnutrition Etiology: chronic illness (HIV) Signs/Symptoms: mild muscle depletion, moderate muscle depletion, mild fat depletion, moderate fat depletion Interventions: Ensure Enlive (each supplement provides 350kcal and 20 grams of protein), Tube feeding, MVI-patient also tolerating D1 diet when fed-decannulated so hopeful can progress diet soon;consideration given to transitioning to bolus tube feeds in the event daughter request to take patient home with her Estimated body mass index is 41.24 kg/m as calculated from the following: Height as of this encounter: 4\' 10"  (1.473 m). Weight as of this encounter: 89.5 kg.  Acute diarrhea -Resolved  Hypertension/left ventricular diastolic dysfunction -Heart rate and blood pressure stable on Coreg.  HIV/AIDS -Her HIV medications have been sent to [place], daughter to pick up the scripts and take it to skilled nursing facility   Chronic normocytic anemia -Hemoglobin stable at 9.0  GOC  -Family had requested transfer to another facility when informed poor prognosis. At this time no clinical reason to transfer to other facility. Palliative care following intermittently -LTAC denied -Case management assisting with SNF placement  History of breast cancer Was on Arimidex prior to admission  She has a sacral/perineal right stage II ulcer-partial thickness loss of dermis presenting with shallow open injury with red/pink wound bed without sludge.  2 cm X 1 cm.   Body mass index is 41.24 kg/m.  Pressure Injury 02/16/20 Perineum Right Stage 2 -  Partial thickness loss of dermis presenting as a shallow open injury with a red, pink wound bed without slough. 2cm x 1cm pink (Active)  02/16/20 1200  Location: Perineum  Location  Orientation: Right  Staging: Stage 2 -  Partial thickness loss of dermis presenting as a shallow open injury with a red, pink wound bed without slough.  Wound Description (Comments): 2cm x 1cm pink  Present on Admission: No        Discharge Diagnoses:  Active Problems:   DNR (do not resuscitate) discussion   Acute right MCA stroke ([place])   Middle cerebral artery embolism, right   Malnutrition of moderate degree   Adenocarcinoma of left lung ([place])   Adenocarcinoma ([place])   Palliative care by specialist   Infiltrate noted on imaging study   Pressure injury of skin   Endotracheal tube present   Pleural effusion on left   Acute respiratory failure with hypoxia ([place])   Tracheostomy in place ([place])   Ventilator dependent ([place])   Oropharyngeal dysphagia      Subjective: Seen and examined at bedside, no acute overnight events.  Feels okay.  Discharge Exam: Vitals:   03/22/20 0136 03/22/20 0436  BP: (!) 164/58 139/73  Pulse: 96 86  Resp: (!) 28 17  Temp:    SpO2: 98% 97%   Vitals:   03/21/20 2136 03/21/20 2335 03/22/20 0136 03/22/20 0436  BP: (!) 141/84 (!) 140/53 (!) 164/58 139/73  Pulse: 88 94 96 86  Resp: (!) 23 18 (!) 28 17  Temp:  99.1 F (37.3 C)    TempSrc:  Axillary    SpO2: 99% 98% 98% 97%  Weight:      Height:        General: Pt is alert, awake, not in acute distress, appears chronically ill and frail.  Lurline Idol site noted, now decannulated Cardiovascular: RRR, S1/S2 +, no rubs, no gallops Respiratory: CTA bilaterally, no wheezing, no rhonchi Abdominal: Soft, NT, ND, bowel sounds + Extremities: no edema, no cyanosis  Discharge Instructions   Allergies as of 03/22/2020  Reactions   Sulfa Antibiotics Swelling, Rash      Medication List    STOP taking these medications   amoxicillin-clavulanate 875-125 MG tablet Commonly known as: AUGMENTIN   Biktarvy 50-200-25 MG Tabs tablet Generic drug: bictegravir-emtricitabine-tenofovir AF    hydrochlorothiazide 25 MG tablet Commonly known as: HYDRODIURIL   MELATONIN PO   metoprolol succinate 25 MG 24 hr tablet Commonly known as: TOPROL-XL   potassium chloride SA 20 MEQ tablet Commonly known as: KLOR-CON     TAKE these medications   acetaminophen 325 MG tablet Commonly known as: TYLENOL Place 2 tablets (650 mg total) into feeding tube every 4 (four) hours as needed for mild pain (or temp > 37.5 C (99.5 F)).   anastrozole 1 MG tablet Commonly known as: ARIMIDEX Place 1 tablet (1 mg total) into feeding tube daily. What changed: how to take this   aspirin 81 MG chewable tablet Place 1 tablet (81 mg total) into feeding tube daily.   atovaquone 750 MG/5ML suspension Commonly known as: MEPRON Place 10 mLs (1,500 mg total) into feeding tube daily with breakfast.   carvedilol 3.125 MG tablet Commonly known as: COREG Place 1 tablet (3.125 mg total) into feeding tube 2 (two) times daily with a meal.   dicyclomine 10 MG/5ML solution Commonly known as: BENTYL Place 5 mLs (10 mg total) into feeding tube 4 (four) times daily -  before meals and at bedtime.   dolutegravir 50 MG tablet Commonly known as: TIVICAY Take 1 tablet (50 mg total) by mouth daily. OK PER TUBE   emtricitabine-tenofovir AF 200-25 MG tablet Commonly known as: DESCOVY Place 1 tablet into feeding tube daily.   feeding supplement Liqd Take 237 mLs by mouth 2 (two) times daily between meals.   feeding supplement (OSMOLITE 1.5 CAL) Liqd Place 237 mLs into feeding tube 5 (five) times daily.   free water Soln Place 200 mLs into feeding tube every 6 (six) hours.   hydrALAZINE 25 MG tablet Commonly known as: APRESOLINE Place 1 tablet (25 mg total) into feeding tube 3 (three) times daily.   insulin aspart 100 UNIT/ML injection Commonly known as: novoLOG Inject 0-15 Units into the skin every 4 (four) hours. Correction coverage: Moderate (average weight, post-op)  CBG < 70: Implement Hypoglycemia  Standing Orders and refer to Hypoglycemia Standing Orders sidebar report  CBG 70 - 120: 0 units  CBG 121 - 150: 2 units  CBG 151 - 200: 3 units  CBG 201 - 250: 5 units  CBG 251 - 300: 8 units  CBG 301 - 350: 11 units  CBG 351 - 400: 15 units  CBG > 400 call MD and obtain STAT lab verification   insulin detemir 100 UNIT/ML injection Commonly known as: LEVEMIR Inject 0.2 mLs (20 Units total) into the skin daily.   QUEtiapine 25 MG tablet Commonly known as: SEROQUEL Place 0.5 tablets (12.5 mg total) into feeding tube at bedtime.   rosuvastatin 10 MG tablet Commonly known as: CRESTOR 1 tablet (10 mg total) by Per NG tube route daily.       Allergies  Allergen Reactions  . Sulfa Antibiotics Swelling and Rash    You were cared for by a hospitalist during your hospital stay. If you have any questions about your discharge medications or the care you received while you were in the hospital after you are discharged, you can call the unit and asked to speak with the hospitalist on call if the hospitalist that took care of  you is not available. Once you are discharged, your primary care physician will handle any further medical issues. Please note that no refills for any discharge medications will be authorized once you are discharged, as it is imperative that you return to your primary care physician (or establish a relationship with a primary care physician if you do not have one) for your aftercare needs so that they can reassess your need for medications and monitor your lab values.   Procedures/Studies: DG Chest 2 View  Result Date: 03/03/2020 CLINICAL DATA:  Pleural effusion. EXAM: CHEST - 2 VIEW COMPARISON:  February 19, 2020. FINDINGS: The heart size and mediastinal contours are within normal limits. Tracheostomy tube is in good position. No pneumothorax is noted. Right lung is clear. Left basilar atelectasis or infiltrate is noted with probable small left pleural effusion. The  visualized skeletal structures are unremarkable. IMPRESSION: Left basilar atelectasis or infiltrate is noted with probable small left pleural effusion. Electronically Signed   By: Marijo Conception M.D.   On: 03/03/2020 10:34   DG Swallowing Func-Speech Pathology  Result Date: 02/24/2020 Objective Swallowing Evaluation: Type of Study: MBS-Modified Barium Swallow Study  Patient Details Name: ZAYLIN PISTILLI MRN: [id number] Date of Birth: Aug 01, 1940 Today's Date: 02/24/2020 Time: SLP Start Time (ACUTE ONLY): 1140 -SLP Stop Time (ACUTE ONLY): 1210 SLP Time Calculation (min) (ACUTE ONLY): 30 min Past Medical History: Past Medical History: Diagnosis Date . Arthritis  . Depression  . Diabetes mellitus without complication ([place])  . Eating disorder  . Hyperlipidemia  . Hypertension  . Migraines  Past Surgical History: Past Surgical History: Procedure Laterality Date . BREAST BIOPSY   . BREAST LUMPECTOMY Right   Years ago / Pt's daughter thinks it was cancer . IR CT HEAD LTD  01/29/2020 . IR PERCUTANEOUS ART THROMBECTOMY/INFUSION INTRACRANIAL INC DIAG ANGIO  01/29/2020 . PEG PLACEMENT N/A 02/20/2020  Procedure: PERCUTANEOUS ENDOSCOPIC GASTROSTOMY (PEG) PLACEMENT;  Surgeon: Jesusita Oka, MD;  Location: [facility];  Service: General;  Laterality: N/A; . RADIOLOGY WITH ANESTHESIA N/A 01/29/2020  Procedure: IR WITH ANESTHESIA - CODE STROKE;  Surgeon: Radiologist, Medication, MD;  Location: [facility];  Service: Radiology;  Laterality: N/A; HPI: Patient is a 79 y.o. female with PMH: HIV, depression, DM, HTN, HLD, arthritis, breast cancer, who was admitted on 10/7, who presented with acute onset of left hemiplegia, left facial droop, right eye deviation and aphasia. MR Brain revealed multifocal infarct in the right MCA territory affecting cortex and striatum, mild petechial hemorrhage as well as small acute left cerebellar infarct and generalized brain atrophy. She was intubated from 10/7 through 10/20 when trach was placed. PEG placed  on 10/29.  Most recent CXR on 10/28 revealed persistent left base collapse/consolidation with small left pleural effusion. Patient has a Therapist, sports, cuffed and is tolerating trach collar at 8 Liters, 35 FiO2.  Subjective: alert, follows commands inconsistently Assessment / Plan / Recommendation CHL IP CLINICAL IMPRESSIONS 02/24/2020 Clinical Impression Pt presents with a primary oral phase dysphagia, influenced more by her cognitive status than sensorimotor dysfunction.  Positioning for the study was challenging, with pt leaning to the left with head turn, impacting the clarity of the view. She had difficulty using a straw, biting it repetitively.  Thin liquids from the edge of a cup led to left anterior spillage; purees were managed better.  Overall, oral phase was c/b decreased bolus cohesion/control.  Once material reached the valleculae, the pharyngeal swallow triggered and there was consistent laryngeal vestibule closure  with no penetration and no aspiration.  Successive boluses of thin liquids were offered, and airway protection was reliable. There was good pharyngeal clearance with no residue post-swallow for any consistencies.  Pt used PMV for duration of study with excellent toleration.  Recommend initiating a dysphagia 1 diet with thin liquids.  Cognition will be primary barrier to adequate oral intake.  D/W RN, who was present for study.    SLP Visit Diagnosis Dysphagia, oral phase (R13.11) Attention and concentration deficit following -- Frontal lobe and executive function deficit following -- Impact on safety and function --   CHL IP TREATMENT RECOMMENDATION 02/24/2020 Treatment Recommendations Therapy as outlined in treatment plan below   Prognosis 02/24/2020 Prognosis for Safe Diet Advancement Good Barriers to Reach Goals Cognitive deficits Barriers/Prognosis Comment -- CHL IP DIET RECOMMENDATION 02/24/2020 SLP Diet Recommendations Dysphagia 1 (Puree) solids;Thin liquid Liquid Administration via Cup  Medication Administration Via alternative means Compensations -- Postural Changes Remain semi-upright after after feeds/meals (Comment)   CHL IP OTHER RECOMMENDATIONS 02/24/2020 Recommended Consults -- Oral Care Recommendations Oral care BID Other Recommendations Place PMSV during PO intake   CHL IP FOLLOW UP RECOMMENDATIONS 02/24/2020 Follow up Recommendations Skilled Nursing facility   [facility] IP FREQUENCY AND DURATION 02/24/2020 Speech Therapy Frequency (ACUTE ONLY) min 2x/week Treatment Duration 2 weeks      CHL IP ORAL PHASE 02/24/2020 Oral Phase Impaired Oral - Pudding Teaspoon -- Oral - Pudding Cup -- Oral - Honey Teaspoon -- Oral - Honey Cup -- Oral - Nectar Teaspoon -- Oral - Nectar Cup Left anterior bolus loss;Left pocketing in lateral sulci;Decreased bolus cohesion Oral - Nectar Straw -- Oral - Thin Teaspoon -- Oral - Thin Cup Left anterior bolus loss;Decreased bolus cohesion Oral - Thin Straw -- Oral - Puree Left anterior bolus loss;Decreased bolus cohesion;Delayed oral transit Oral - Mech Soft -- Oral - Regular -- Oral - Multi-Consistency -- Oral - Pill -- Oral Phase - Comment --  CHL IP PHARYNGEAL PHASE 02/24/2020 Pharyngeal Phase WFL Pharyngeal- Pudding Teaspoon -- Pharyngeal -- Pharyngeal- Pudding Cup -- Pharyngeal -- Pharyngeal- Honey Teaspoon -- Pharyngeal -- Pharyngeal- Honey Cup -- Pharyngeal -- Pharyngeal- Nectar Teaspoon -- Pharyngeal -- Pharyngeal- Nectar Cup -- Pharyngeal -- Pharyngeal- Nectar Straw -- Pharyngeal -- Pharyngeal- Thin Teaspoon -- Pharyngeal -- Pharyngeal- Thin Cup -- Pharyngeal -- Pharyngeal- Thin Straw -- Pharyngeal -- Pharyngeal- Puree -- Pharyngeal -- Pharyngeal- Mechanical Soft -- Pharyngeal -- Pharyngeal- Regular -- Pharyngeal -- Pharyngeal- Multi-consistency -- Pharyngeal -- Pharyngeal- Pill -- Pharyngeal -- Pharyngeal Comment --  No flowsheet data found. Juan Quam Laurice 02/24/2020, 12:41 PM                The results of significant diagnostics from this hospitalization  (including imaging, microbiology, ancillary and laboratory) are listed below for reference.     Microbiology: Recent Results (from the past 240 hour(s))  Resp Panel by RT-PCR (Flu A&B, Covid) Nasopharyngeal Swab     Status: None   Collection Time: 03/19/20  4:18 PM   Specimen: Nasopharyngeal Swab; Nasopharyngeal(NP) swabs in vial transport medium  Result Value Ref Range Status   SARS Coronavirus 2 by RT PCR NEGATIVE NEGATIVE Final    Comment: (NOTE) SARS-CoV-2 target nucleic acids are NOT DETECTED.  The SARS-CoV-2 RNA is generally detectable in upper respiratory specimens during the acute phase of infection. The lowest concentration of SARS-CoV-2 viral copies this assay can detect is 138 copies/mL. A negative result does not preclude SARS-Cov-2 infection and should not be used as the  sole basis for treatment or other patient management decisions. A negative result may occur with  improper specimen collection/handling, submission of specimen other than nasopharyngeal swab, presence of viral mutation(s) within the areas targeted by this assay, and inadequate number of viral copies(<138 copies/mL). A negative result must be combined with clinical observations, patient history, and epidemiological information. The expected result is Negative.  Fact Sheet for Patients:  EntrepreneurPulse.com.au  Fact Sheet for Healthcare Providers:  IncredibleEmployment.be  This test is no t yet approved or cleared by the [place] FDA and  has been authorized for detection and/or diagnosis of SARS-CoV-2 by FDA under an Emergency Use Authorization (EUA). This EUA will remain  in effect (meaning this test can be used) for the duration of the COVID-19 declaration under Section 564(b)(1) of the Act, 21 U.S.C.section 360bbb-3(b)(1), unless the authorization is terminated  or revoked sooner.       Influenza A by PCR NEGATIVE NEGATIVE Final   Influenza B by PCR  NEGATIVE NEGATIVE Final    Comment: (NOTE) The Xpert Xpress SARS-CoV-2/FLU/RSV plus assay is intended as an aid in the diagnosis of influenza from Nasopharyngeal swab specimens and should not be used as a sole basis for treatment. Nasal washings and aspirates are unacceptable for Xpert Xpress SARS-CoV-2/FLU/RSV testing.  Fact Sheet for Patients: EntrepreneurPulse.com.au  Fact Sheet for Healthcare Providers: IncredibleEmployment.be  This test is not yet approved or cleared by the [place] FDA and has been authorized for detection and/or diagnosis of SARS-CoV-2 by FDA under an Emergency Use Authorization (EUA). This EUA will remain in effect (meaning this test can be used) for the duration of the COVID-19 declaration under Section 564(b)(1) of the Act, 21 U.S.C. section 360bbb-3(b)(1), unless the authorization is terminated or revoked.  Performed at [facility], [place] [address] [place], [place] [address]      Labs: BNP (last 3 results) No results for input(s): BNP in the last 8760 hours. Basic Metabolic Panel: Recent Labs  Lab 03/18/20 0255 03/19/20 0412 03/20/20 0312 03/21/20 0242 03/22/20 0413  NA 127* 132* 130* 131* 131*  K 4.8 5.1 5.7* 5.0 5.0  CL 90* 96* 95* 94* 93*  CO2 25 25 27 28 27   GLUCOSE 124* 63* 54* 86 85  BUN 26* 24* 24* 28* 27*  CREATININE 0.91 0.92 0.87 0.85 1.02*  CALCIUM 9.0 9.0 8.9 8.9 9.2   Liver Function Tests: No results for input(s): AST, ALT, ALKPHOS, BILITOT, PROT, ALBUMIN in the last 168 hours. No results for input(s): LIPASE, AMYLASE in the last 168 hours. No results for input(s): AMMONIA in the last 168 hours. CBC: No results for input(s): WBC, NEUTROABS, HGB, HCT, MCV, PLT in the last 168 hours. Cardiac Enzymes: No results for input(s): CKTOTAL, CKMB, CKMBINDEX, TROPONINI in the last 168 hours. BNP: Invalid input(s): POCBNP CBG: Recent Labs  Lab 03/21/20 1734 03/21/20 2039  03/22/20 0010 03/22/20 0339 03/22/20 0616  GLUCAP 255* 212* 270* 60* 127*   D-Dimer No results for input(s): DDIMER in the last 72 hours. Hgb A1c No results for input(s): HGBA1C in the last 72 hours. Lipid Profile No results for input(s): CHOL, HDL, LDLCALC, TRIG, CHOLHDL, LDLDIRECT in the last 72 hours. Thyroid function studies No results for input(s): TSH, T4TOTAL, T3FREE, THYROIDAB in the last 72 hours.  Invalid input(s): FREET3 Anemia work up No results for input(s): VITAMINB12, FOLATE, FERRITIN, TIBC, IRON, RETICCTPCT in the last 72 hours. Urinalysis    Component Value Date/Time   COLORURINE YELLOW 02/02/2020 1554  APPEARANCEUR CLEAR 02/02/2020 1554   LABSPEC 1.018 02/02/2020 1554   PHURINE 5.0 02/02/2020 1554   GLUCOSEU 50 (A) 02/02/2020 1554   HGBUR SMALL (A) 02/02/2020 1554   BILIRUBINUR NEGATIVE 02/02/2020 1554   KETONESUR NEGATIVE 02/02/2020 1554   PROTEINUR NEGATIVE 02/02/2020 1554   NITRITE NEGATIVE 02/02/2020 1554   LEUKOCYTESUR NEGATIVE 02/02/2020 1554   Sepsis Labs Invalid input(s): PROCALCITONIN,  WBC,  LACTICIDVEN Microbiology Recent Results (from the past 240 hour(s))  Resp Panel by RT-PCR (Flu A&B, Covid) Nasopharyngeal Swab     Status: None   Collection Time: 03/19/20  4:18 PM   Specimen: Nasopharyngeal Swab; Nasopharyngeal(NP) swabs in vial transport medium  Result Value Ref Range Status   SARS Coronavirus 2 by RT PCR NEGATIVE NEGATIVE Final    Comment: (NOTE) SARS-CoV-2 target nucleic acids are NOT DETECTED.  The SARS-CoV-2 RNA is generally detectable in upper respiratory specimens during the acute phase of infection. The lowest concentration of SARS-CoV-2 viral copies this assay can detect is 138 copies/mL. A negative result does not preclude SARS-Cov-2 infection and should not be used as the sole basis for treatment or other patient management decisions. A negative result may occur with  improper specimen collection/handling, submission of  specimen other than nasopharyngeal swab, presence of viral mutation(s) within the areas targeted by this assay, and inadequate number of viral copies(<138 copies/mL). A negative result must be combined with clinical observations, patient history, and epidemiological information. The expected result is Negative.  Fact Sheet for Patients:  EntrepreneurPulse.com.au  Fact Sheet for Healthcare Providers:  IncredibleEmployment.be  This test is no t yet approved or cleared by the [place] FDA and  has been authorized for detection and/or diagnosis of SARS-CoV-2 by FDA under an Emergency Use Authorization (EUA). This EUA will remain  in effect (meaning this test can be used) for the duration of the COVID-19 declaration under Section 564(b)(1) of the Act, 21 U.S.C.section 360bbb-3(b)(1), unless the authorization is terminated  or revoked sooner.       Influenza A by PCR NEGATIVE NEGATIVE Final   Influenza B by PCR NEGATIVE NEGATIVE Final    Comment: (NOTE) The Xpert Xpress SARS-CoV-2/FLU/RSV plus assay is intended as an aid in the diagnosis of influenza from Nasopharyngeal swab specimens and should not be used as a sole basis for treatment. Nasal washings and aspirates are unacceptable for Xpert Xpress SARS-CoV-2/FLU/RSV testing.  Fact Sheet for Patients: EntrepreneurPulse.com.au  Fact Sheet for Healthcare Providers: IncredibleEmployment.be  This test is not yet approved or cleared by the [place] FDA and has been authorized for detection and/or diagnosis of SARS-CoV-2 by FDA under an Emergency Use Authorization (EUA). This EUA will remain in effect (meaning this test can be used) for the duration of the COVID-19 declaration under Section 564(b)(1) of the Act, 21 U.S.C. section 360bbb-3(b)(1), unless the authorization is terminated or revoked.  Performed at [facility], [place] [address],  [place], [place] [address]      Time coordinating discharge:  I have spent 35 minutes face to face with the patient and on the ward discussing the patients care, assessment, plan and disposition with other care givers. >50% of the time was devoted counseling the patient about the risks and benefits of treatment/Discharge disposition and coordinating care.   SIGNED:   Erin Hearing, MD  [facility] 03/22/2020, 7:48 AM   If 7PM-7AM, please contact night-coverage

## 2020-03-21 DIAGNOSIS — C801 Malignant (primary) neoplasm, unspecified: Secondary | ICD-10-CM | POA: Diagnosis not present

## 2020-03-21 DIAGNOSIS — I63511 Cerebral infarction due to unspecified occlusion or stenosis of right middle cerebral artery: Secondary | ICD-10-CM | POA: Diagnosis not present

## 2020-03-21 LAB — GLUCOSE, CAPILLARY
Glucose-Capillary: 194 mg/dL — ABNORMAL HIGH (ref 70–99)
Glucose-Capillary: 212 mg/dL — ABNORMAL HIGH (ref 70–99)
Glucose-Capillary: 251 mg/dL — ABNORMAL HIGH (ref 70–99)
Glucose-Capillary: 255 mg/dL — ABNORMAL HIGH (ref 70–99)
Glucose-Capillary: 269 mg/dL — ABNORMAL HIGH (ref 70–99)
Glucose-Capillary: 43 mg/dL — CL (ref 70–99)
Glucose-Capillary: 92 mg/dL (ref 70–99)

## 2020-03-21 LAB — BASIC METABOLIC PANEL
Anion gap: 9 (ref 5–15)
BUN: 28 mg/dL — ABNORMAL HIGH (ref 8–23)
CO2: 28 mmol/L (ref 22–32)
Calcium: 8.9 mg/dL (ref 8.9–10.3)
Chloride: 94 mmol/L — ABNORMAL LOW (ref 98–111)
Creatinine, Ser: 0.85 mg/dL (ref 0.44–1.00)
GFR, Estimated: >60 mL/min (ref >60)
Glucose, Bld: 86 mg/dL (ref 70–99)
Potassium: 5 mmol/L (ref 3.5–5.1)
Sodium: 131 mmol/L — ABNORMAL LOW (ref 135–145)

## 2020-03-21 MED ORDER — INSULIN DETEMIR 100 UNIT/ML ~~LOC~~ SOLN: 20.0000 [IU] | Freq: Every day | SUBCUTANEOUS | 11 refills | Status: AC

## 2020-03-21 MED ORDER — DEXTROSE 50 % IV SOLN
INTRAVENOUS | Status: AC
  Administered 2020-03-21: 50 mL
  Filled 2020-03-21: qty 50

## 2020-03-21 MED ORDER — INSULIN DETEMIR 100 UNIT/ML ~~LOC~~ SOLN
20.0000 [IU] | Freq: Every day | SUBCUTANEOUS | Status: DC
  Administered 2020-03-21 – 2020-03-22 (×2): 20 [IU] via SUBCUTANEOUS
  Filled 2020-03-21 (×2): qty 0.2

## 2020-03-21 MED ORDER — DEXTROSE 50 % IV SOLN: 1.0000 | Freq: Once | INTRAVENOUS | Status: AC

## 2020-03-21 NOTE — Progress Notes (Signed)
Amber Lopez  IWL:[id number] DOB: Jan 15, 1941 DOA: 01/29/2020 PCP: Billie Ruddy, MD   Brief Narrative:  79 year old female with past medical history of HIV/AIDS, HTN, HLD, diabetes, depression, osteoarthritis, breast cancer came to the ER initially on 10/11 with strokelike symptoms-left hemiplegia, left facial droop and aphasia.  CTA head and neck showed large vessel occlusion requiring TPA and emergent revascularization.  She was initially admitted to the ICU postprocedure on mechanical ventilation.  Unfortunately patient mentation continued to decline with poor neurologic status unable to follow commands and difficulty wean off ventilator.  She had to be trached on 10/20 and PEG tube placed on 10/29.  Hospital course also complicated by large left-sided pleural effusion/left-sided apical mass which was consistent with metastatic adenocarcinoma.  She has been denied LTAC placement.  Palliative care discussion with family regarding her poor prognosis.  Family requested patient to be transferred to their facility but no clinical reason was deemed for this transfer therefore remains here.  TOC team working with family regarding placement.  Stable for discharge awaiting placement.   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 -OT recommended pillow splint at bedtime 2/2 ongoing  hemineglect and increased risk for LUE contracture -Continue SLP (swallowing and speech/cognition) -Last PT evaluation on 11/22: Patient continues to require max assist +2 for supine to sit.  Having difficulty sitting on edge of bed secondary to underlying air mattress.  Again requires max assist +2 for lateral scoot transfer performed from bed to chair.  Patient was able to follow basic commands.  Progressing slowly but engaged in therapy sessions.  Slight hypochloremia/hyponatremia-improved with hydration   Acute ventilatory dependent respiratory failure with hypoxemia/Inability to clear secretions/requires tracheostomy tube/COPD -11/19 decannulated by trach team -Based on preadmission medications does not appear to have been on any short acting bronchodilators or LABA prior to admission  Type 2 diabetes mellitus, one episode of hypoglycemia this morning -A1c 6.5 on 10/7. -11/22 CBGs trending up now that more consistent intake of D1 diet plus TF-Levemir changed to 20 units daily  Stage IV adenocarcinoma of the left lung with left pleural effusion  -CT of chest at time of admission: w/ large left pleural effusion with near complete left lung collapse. Left apical mass measuring 3.5 x 3.3 cm suspicious for bronchogenic carcinoma -Pleural fluid pathology consistent with metastatic adenocarcinoma -poor performance status precludes chemotherapy with metastatic disease; 11/15 reinforced this with patient's daughter by telephone -Needs to follow-up with oncology as an outpatient if improves clinically (was evaluated by Dr. Narda Rutherford during this hospitalization)  Dysphagia/ Nutrition Status: Nutrition Status: Nutrition Problem: Moderate Malnutrition Etiology: chronic illness (HIV) Signs/Symptoms: mild muscle depletion, moderate muscle depletion, mild fat depletion, moderate fat depletion Interventions: Ensure Enlive (each supplement provides 350kcal and 20 grams of protein), Tube feeding,  MVI-patient also tolerating D1 diet when fed-decannulated so hopeful  can Amber diet soon; consideration given to transitioning to bolus tube feeds in the event daughter request to take patient home with her Estimated body mass index is 41.24 kg/m as calculated from the following:   Height as of this encounter: 4\' 10"  (1.473 m).   Weight as of this encounter: 89.5 kg.  Acute diarrhea -Resolved  Hypertension/left ventricular diastolic dysfunction -Heart rate and blood pressure stable on Coreg. -No clinical signs consistent with heart failure  HIV/AIDS -Continue home medications including: atovaquone, dolutegravir and descovy; discussing with pharmacy and ID about transitioning to more affordable option since we are pursuing SNF care and expensive medications may be barrier to placement  Chronic normocytic anemia -Hemoglobin stable at 9.0  GOC  -Family had requested transfer to another facility when informed poor prognosis. At this time no clinical reason to transfer to other facility. Palliative care following intermittently -LTAC denied -Case management assisting with SNF placement  History of breast cancer Was on Arimidex prior to admission  She has a sacral/perineal right stage II ulcer-partial thickness loss of dermis presenting with shallow open injury with red/pink wound bed without sludge.  2 cm X 1 cm.  DVT prophylaxis: Lovenox Code Status: DNR Family Communication:    Status is: Inpatient  Remains inpatient appropriate because:Unsafe d/c plan   Dispo: The patient is from: Home              Anticipated d/c is to: SNF              Anticipated d/c date is: > 3 days              Patient currently is medically stable to d/c.  Currently awaiting placement       Body mass index is 41.24 kg/m.  Pressure Injury 02/16/20 Perineum Right Stage 2 -  Partial thickness loss of dermis presenting as a shallow open injury with a red, pink wound bed without slough.  2cm x 1cm pink (Active)  02/16/20 1200  Location: Perineum  Location Orientation: Right  Staging: Stage 2 -  Partial thickness loss of dermis presenting as a shallow open injury with a red, pink wound bed without slough.  Wound Description (Comments): 2cm x 1cm pink  Present on Admission: No      Subjective: 1 asymptomatic hypoglycemic episode this morning, doing well this morning no complaints.    Examination: Constitutional: Chronically ill-appearing Respiratory: Clear to auscultation bilaterally Cardiovascular: Normal sinus rhythm, no rubs Abdomen: Nontender nondistended good bowel sounds Musculoskeletal: No edema noted Skin: No rashes seen Neurologic: Difficult to assess. Psychiatric: Poor judgment but alert to name.  Responds to basic questions  Objective: Vitals:   03/20/20 2031 03/21/20 0025 03/21/20 0409 03/21/20 0722  BP:  (!) 145/54 (!) 131/56 (!) 144/50  Pulse:  92 89 97  Resp: 19 (!) 24 19 20   Temp:  99.1 F (37.3 C) 99 F (37.2 C) 98.2 F (36.8 C)  TempSrc:  Oral Oral Axillary  SpO2: 97% 96% 96% 100%  Weight:      Height:        Intake/Output Summary (Last 24 hours) at 03/21/2020 0743 Last data filed at 03/21/2020 0725 Gross per 24 hour  Intake 2358 ml  Output 750 ml  Net 1608 ml   Filed Weights   03/15/20 0459 03/16/20 0306 03/17/20 0540  Weight: 92 kg 90 kg 89.5 kg     Data Reviewed:   CBC: No results for input(s): WBC, NEUTROABS, HGB, HCT, MCV, PLT in  the last 168 hours. Basic Metabolic Panel: Recent Labs  Lab 03/18/20 0255 03/19/20 0412 03/20/20 0312 03/21/20 0242  NA 127* 132* 130* 131*  K 4.8 5.1 5.7* 5.0  CL 90* 96* 95* 94*  CO2 25 25 27 28   GLUCOSE 124* 63* 54* 86  BUN 26* 24* 24* 28*  CREATININE 0.91 0.92 0.87 0.85  CALCIUM 9.0 9.0 8.9 8.9   GFR: Estimated Creatinine Clearance: 51.1 mL/min (by C-G formula based on SCr of 0.85 mg/dL). Liver Function Tests: No results for input(s): AST, ALT, ALKPHOS, BILITOT, PROT,  ALBUMIN in the last 168 hours. No results for input(s): LIPASE, AMYLASE in the last 168 hours. No results for input(s): AMMONIA in the last 168 hours. Coagulation Profile: No results for input(s): INR, PROTIME in the last 168 hours. Cardiac Enzymes: No results for input(s): CKTOTAL, CKMB, CKMBINDEX, TROPONINI in the last 168 hours. BNP (last 3 results) Recent Labs    08/07/19 1422  PROBNP 110.0*   HbA1C: No results for input(s): HGBA1C in the last 72 hours. CBG: Recent Labs  Lab 03/20/20 1648 03/20/20 1954 03/21/20 0008 03/21/20 0425 03/21/20 0544  GLUCAP 186* 277* 251* 43* 92   Lipid Profile: No results for input(s): CHOL, HDL, LDLCALC, TRIG, CHOLHDL, LDLDIRECT in the last 72 hours. Thyroid Function Tests: No results for input(s): TSH, T4TOTAL, FREET4, T3FREE, THYROIDAB in the last 72 hours. Anemia Panel: No results for input(s): VITAMINB12, FOLATE, FERRITIN, TIBC, IRON, RETICCTPCT in the last 72 hours. Sepsis Labs: No results for input(s): PROCALCITON, LATICACIDVEN in the last 168 hours.  Recent Results (from the past 240 hour(s))  Resp Panel by RT-PCR (Flu A&B, Covid) Nasopharyngeal Swab     Status: None   Collection Time: 03/19/20  4:18 PM   Specimen: Nasopharyngeal Swab; Nasopharyngeal(NP) swabs in vial transport medium  Result Value Ref Range Status   SARS Coronavirus 2 by RT PCR NEGATIVE NEGATIVE Final    Comment: (NOTE) SARS-CoV-2 target nucleic acids are NOT DETECTED.  The SARS-CoV-2 RNA is generally detectable in upper respiratory specimens during the acute phase of infection. The lowest concentration of SARS-CoV-2 viral copies this assay can detect is 138 copies/mL. A negative result does not preclude SARS-Cov-2 infection and should not be used as the sole basis for treatment or other patient management decisions. A negative result may occur with  improper specimen collection/handling, submission of specimen other than nasopharyngeal swab, presence of  viral mutation(s) within the areas targeted by this assay, and inadequate number of viral copies(<138 copies/mL). A negative result must be combined with clinical observations, patient history, and epidemiological information. The expected result is Negative.  Fact Sheet for Patients:  EntrepreneurPulse.com.au  Fact Sheet for Healthcare Providers:  IncredibleEmployment.be  This test is no t yet approved or cleared by the [place] FDA and  has been authorized for detection and/or diagnosis of SARS-CoV-2 by FDA under an Emergency Use Authorization (EUA). This EUA will remain  in effect (meaning this test can be used) for the duration of the COVID-19 declaration under Section 564(b)(1) of the Act, 21 U.S.C.section 360bbb-3(b)(1), unless the authorization is terminated  or revoked sooner.       Influenza A by PCR NEGATIVE NEGATIVE Final   Influenza B by PCR NEGATIVE NEGATIVE Final    Comment: (NOTE) The Xpert Xpress SARS-CoV-2/FLU/RSV plus assay is intended as an aid in the diagnosis of influenza from Nasopharyngeal swab specimens and should not be used as a sole basis for treatment. Nasal washings and aspirates are unacceptable  for Xpert Xpress SARS-CoV-2/FLU/RSV testing.  Fact Sheet for Patients: EntrepreneurPulse.com.au  Fact Sheet for Healthcare Providers: IncredibleEmployment.be  This test is not yet approved or cleared by the [place] FDA and has been authorized for detection and/or diagnosis of SARS-CoV-2 by FDA under an Emergency Use Authorization (EUA). This EUA will remain in effect (meaning this test can be used) for the duration of the COVID-19 declaration under Section 564(b)(1) of the Act, 21 U.S.C. section 360bbb-3(b)(1), unless the authorization is terminated or revoked.  Performed at [facility], [place] [address], [place], [place] [address]          Radiology  Studies: No results found.      Scheduled Meds: . aspirin  81 mg Per Tube Daily  . atovaquone  1,500 mg Per Tube Q breakfast  . carvedilol  3.125 mg Per Tube BID WC  . chlorhexidine  15 mL Mouth Rinse BID  . Chlorhexidine Gluconate Cloth  6 each Topical Daily  . dicyclomine  10 mg Per Tube TID AC & HS  . dolutegravir  50 mg Per Tube Daily  . emtricitabine-tenofovir AF  1 tablet Per Tube Daily  . enoxaparin (LOVENOX) injection  40 mg Subcutaneous Q24H  . feeding supplement  237 mL Oral BID BM  . feeding supplement (OSMOLITE 1.5 CAL)  237 mL Per Tube 5 X Daily  . free water  200 mL Per Tube Q6H  . insulin aspart  0-15 Units Subcutaneous Q4H  . insulin detemir  20 Units Subcutaneous Daily  . mouth rinse  15 mL Mouth Rinse q12n4p  . pantoprazole sodium  40 mg Per Tube QHS  . QUEtiapine  12.5 mg Per Tube QHS  . rosuvastatin  10 mg Per NG tube Daily   Continuous Infusions: . sodium chloride 250 mL (02/11/20 2212)  . sodium chloride Stopped (02/09/20 0556)     LOS: 52 days   Time spent= 15 mins    Doyt Castellana Arsenio Loader, MD [facility]  If 7PM-7AM, please contact night-coverage  03/21/2020, 7:43 AM

## 2020-03-21 NOTE — Progress Notes (Deleted)
Transferred pt to the bedside chair with the hoyer lift with the assistance of charge nurse. Pt resting with call bell within reach. 02 states remain at 98% on 2 liters of humidified oxygen.

## 2020-03-21 NOTE — Patient Care Conference (Deleted)
Pt has pulled her cortrack and her Catheter out while in the bedside chair. We transfered her back to bed and reinserted foley. Dr Reesa Chew notified of removed Cortrak. Orders received.

## 2020-03-22 ENCOUNTER — Inpatient Hospital Stay (HOSPITAL_COMMUNITY): Payer: Medicare Other

## 2020-03-22 DIAGNOSIS — C3490 Malignant neoplasm of unspecified part of unspecified bronchus or lung: Secondary | ICD-10-CM | POA: Diagnosis not present

## 2020-03-22 DIAGNOSIS — I69354 Hemiplegia and hemiparesis following cerebral infarction affecting left non-dominant side: Secondary | ICD-10-CM | POA: Diagnosis not present

## 2020-03-22 DIAGNOSIS — R1111 Vomiting without nausea: Secondary | ICD-10-CM | POA: Diagnosis not present

## 2020-03-22 DIAGNOSIS — R69 Illness, unspecified: Secondary | ICD-10-CM | POA: Diagnosis not present

## 2020-03-22 DIAGNOSIS — R410 Disorientation, unspecified: Secondary | ICD-10-CM | POA: Diagnosis not present

## 2020-03-22 DIAGNOSIS — C3491 Malignant neoplasm of unspecified part of right bronchus or lung: Secondary | ICD-10-CM | POA: Diagnosis not present

## 2020-03-22 DIAGNOSIS — E119 Type 2 diabetes mellitus without complications: Secondary | ICD-10-CM | POA: Diagnosis not present

## 2020-03-22 DIAGNOSIS — R1312 Dysphagia, oropharyngeal phase: Secondary | ICD-10-CM | POA: Diagnosis not present

## 2020-03-22 DIAGNOSIS — M2550 Pain in unspecified joint: Secondary | ICD-10-CM | POA: Diagnosis not present

## 2020-03-22 DIAGNOSIS — C50919 Malignant neoplasm of unspecified site of unspecified female breast: Secondary | ICD-10-CM | POA: Diagnosis not present

## 2020-03-22 DIAGNOSIS — K9423 Gastrostomy malfunction: Secondary | ICD-10-CM | POA: Diagnosis not present

## 2020-03-22 DIAGNOSIS — I639 Cerebral infarction, unspecified: Secondary | ICD-10-CM | POA: Diagnosis not present

## 2020-03-22 DIAGNOSIS — Z794 Long term (current) use of insulin: Secondary | ICD-10-CM | POA: Diagnosis not present

## 2020-03-22 DIAGNOSIS — Z431 Encounter for attention to gastrostomy: Secondary | ICD-10-CM | POA: Diagnosis not present

## 2020-03-22 DIAGNOSIS — Z930 Tracheostomy status: Secondary | ICD-10-CM | POA: Diagnosis not present

## 2020-03-22 DIAGNOSIS — R5381 Other malaise: Secondary | ICD-10-CM | POA: Diagnosis not present

## 2020-03-22 DIAGNOSIS — R1310 Dysphagia, unspecified: Secondary | ICD-10-CM | POA: Diagnosis not present

## 2020-03-22 DIAGNOSIS — E559 Vitamin D deficiency, unspecified: Secondary | ICD-10-CM | POA: Diagnosis not present

## 2020-03-22 DIAGNOSIS — E785 Hyperlipidemia, unspecified: Secondary | ICD-10-CM | POA: Diagnosis not present

## 2020-03-22 DIAGNOSIS — J90 Pleural effusion, not elsewhere classified: Secondary | ICD-10-CM | POA: Diagnosis not present

## 2020-03-22 DIAGNOSIS — C3492 Malignant neoplasm of unspecified part of left bronchus or lung: Secondary | ICD-10-CM | POA: Diagnosis not present

## 2020-03-22 DIAGNOSIS — Z4682 Encounter for fitting and adjustment of non-vascular catheter: Secondary | ICD-10-CM | POA: Diagnosis not present

## 2020-03-22 DIAGNOSIS — R404 Transient alteration of awareness: Secondary | ICD-10-CM | POA: Diagnosis not present

## 2020-03-22 DIAGNOSIS — R279 Unspecified lack of coordination: Secondary | ICD-10-CM | POA: Diagnosis not present

## 2020-03-22 DIAGNOSIS — E1169 Type 2 diabetes mellitus with other specified complication: Secondary | ICD-10-CM | POA: Diagnosis not present

## 2020-03-22 DIAGNOSIS — B20 Human immunodeficiency virus [HIV] disease: Secondary | ICD-10-CM | POA: Diagnosis not present

## 2020-03-22 DIAGNOSIS — Z7401 Bed confinement status: Secondary | ICD-10-CM | POA: Diagnosis not present

## 2020-03-22 DIAGNOSIS — Z931 Gastrostomy status: Secondary | ICD-10-CM | POA: Diagnosis not present

## 2020-03-22 DIAGNOSIS — D649 Anemia, unspecified: Secondary | ICD-10-CM | POA: Diagnosis not present

## 2020-03-22 DIAGNOSIS — I700 Atherosclerosis of aorta: Secondary | ICD-10-CM | POA: Diagnosis not present

## 2020-03-22 DIAGNOSIS — J449 Chronic obstructive pulmonary disease, unspecified: Secondary | ICD-10-CM | POA: Diagnosis not present

## 2020-03-22 DIAGNOSIS — E1165 Type 2 diabetes mellitus with hyperglycemia: Secondary | ICD-10-CM | POA: Diagnosis not present

## 2020-03-22 DIAGNOSIS — J9690 Respiratory failure, unspecified, unspecified whether with hypoxia or hypercapnia: Secondary | ICD-10-CM | POA: Diagnosis not present

## 2020-03-22 DIAGNOSIS — I69320 Aphasia following cerebral infarction: Secondary | ICD-10-CM | POA: Diagnosis not present

## 2020-03-22 DIAGNOSIS — I69328 Other speech and language deficits following cerebral infarction: Secondary | ICD-10-CM | POA: Diagnosis not present

## 2020-03-22 DIAGNOSIS — E440 Moderate protein-calorie malnutrition: Secondary | ICD-10-CM | POA: Diagnosis not present

## 2020-03-22 DIAGNOSIS — Z21 Asymptomatic human immunodeficiency virus [HIV] infection status: Secondary | ICD-10-CM | POA: Diagnosis not present

## 2020-03-22 DIAGNOSIS — F32A Depression, unspecified: Secondary | ICD-10-CM | POA: Diagnosis not present

## 2020-03-22 DIAGNOSIS — R1110 Vomiting, unspecified: Secondary | ICD-10-CM | POA: Diagnosis not present

## 2020-03-22 DIAGNOSIS — E1151 Type 2 diabetes mellitus with diabetic peripheral angiopathy without gangrene: Secondary | ICD-10-CM | POA: Diagnosis not present

## 2020-03-22 DIAGNOSIS — R6889 Other general symptoms and signs: Secondary | ICD-10-CM | POA: Diagnosis not present

## 2020-03-22 DIAGNOSIS — I63511 Cerebral infarction due to unspecified occlusion or stenosis of right middle cerebral artery: Secondary | ICD-10-CM | POA: Diagnosis not present

## 2020-03-22 DIAGNOSIS — Z4659 Encounter for fitting and adjustment of other gastrointestinal appliance and device: Secondary | ICD-10-CM | POA: Diagnosis not present

## 2020-03-22 DIAGNOSIS — I110 Hypertensive heart disease with heart failure: Secondary | ICD-10-CM | POA: Diagnosis not present

## 2020-03-22 DIAGNOSIS — J910 Malignant pleural effusion: Secondary | ICD-10-CM | POA: Diagnosis not present

## 2020-03-22 DIAGNOSIS — R482 Apraxia: Secondary | ICD-10-CM | POA: Diagnosis not present

## 2020-03-22 DIAGNOSIS — R531 Weakness: Secondary | ICD-10-CM | POA: Diagnosis not present

## 2020-03-22 DIAGNOSIS — I6340 Cerebral infarction due to embolism of unspecified cerebral artery: Secondary | ICD-10-CM | POA: Diagnosis not present

## 2020-03-22 DIAGNOSIS — Z593 Problems related to living in residential institution: Secondary | ICD-10-CM | POA: Diagnosis not present

## 2020-03-22 DIAGNOSIS — Z743 Need for continuous supervision: Secondary | ICD-10-CM | POA: Diagnosis not present

## 2020-03-22 DIAGNOSIS — Z7982 Long term (current) use of aspirin: Secondary | ICD-10-CM | POA: Diagnosis not present

## 2020-03-22 DIAGNOSIS — R262 Difficulty in walking, not elsewhere classified: Secondary | ICD-10-CM | POA: Diagnosis not present

## 2020-03-22 DIAGNOSIS — M6281 Muscle weakness (generalized): Secondary | ICD-10-CM | POA: Diagnosis not present

## 2020-03-22 DIAGNOSIS — I5030 Unspecified diastolic (congestive) heart failure: Secondary | ICD-10-CM | POA: Diagnosis not present

## 2020-03-22 DIAGNOSIS — I10 Essential (primary) hypertension: Secondary | ICD-10-CM | POA: Diagnosis not present

## 2020-03-22 DIAGNOSIS — I69391 Dysphagia following cerebral infarction: Secondary | ICD-10-CM | POA: Diagnosis not present

## 2020-03-22 LAB — BASIC METABOLIC PANEL
Anion gap: 11 (ref 5–15)
BUN: 27 mg/dL — ABNORMAL HIGH (ref 8–23)
CO2: 27 mmol/L (ref 22–32)
Calcium: 9.2 mg/dL (ref 8.9–10.3)
Chloride: 93 mmol/L — ABNORMAL LOW (ref 98–111)
Creatinine, Ser: 1.02 mg/dL — ABNORMAL HIGH (ref 0.44–1.00)
GFR, Estimated: 56 mL/min — ABNORMAL LOW (ref >60)
Glucose, Bld: 85 mg/dL (ref 70–99)
Potassium: 5 mmol/L (ref 3.5–5.1)
Sodium: 131 mmol/L — ABNORMAL LOW (ref 135–145)

## 2020-03-22 LAB — GLUCOSE, CAPILLARY
Glucose-Capillary: 127 mg/dL — ABNORMAL HIGH (ref 70–99)
Glucose-Capillary: 155 mg/dL — ABNORMAL HIGH (ref 70–99)
Glucose-Capillary: 212 mg/dL — ABNORMAL HIGH (ref 70–99)
Glucose-Capillary: 270 mg/dL — ABNORMAL HIGH (ref 70–99)
Glucose-Capillary: 323 mg/dL — ABNORMAL HIGH (ref 70–99)
Glucose-Capillary: 60 mg/dL — ABNORMAL LOW (ref 70–99)

## 2020-03-22 LAB — RESP PANEL BY RT-PCR (FLU A&B, COVID) ARPGX2
Influenza A by PCR: NEGATIVE
Influenza B by PCR: NEGATIVE
SARS Coronavirus 2 by RT PCR: NEGATIVE

## 2020-03-22 MED ORDER — HYDRALAZINE HCL 25 MG PO TABS: 25.0000 mg | ORAL_TABLET | Freq: Three times a day (TID) | ORAL | Status: AC

## 2020-03-22 MED ORDER — HYDRALAZINE HCL 25 MG PO TABS
25.0000 mg | ORAL_TABLET | Freq: Three times a day (TID) | ORAL | Status: DC
  Administered 2020-03-22: 25 mg
  Filled 2020-03-22 (×2): qty 1

## 2020-03-22 MED ORDER — INSULIN ASPART 100 UNIT/ML ~~LOC~~ SOLN
3.0000 [IU] | Freq: Three times a day (TID) | SUBCUTANEOUS | Status: DC
  Administered 2020-03-22 (×2): 3 [IU] via SUBCUTANEOUS

## 2020-03-22 NOTE — Care Management Important Message (Signed)
Important Message  Patient Details  Name: Amber Lopez MRN: [id number] Date of Birth: [birth year]   Medicare Important Message Given:  Yes     Pierce Biagini P Vermell [place] 03/22/2020, 3:23 PM

## 2020-03-22 NOTE — Progress Notes (Signed)
Physical Therapy Treatment Patient Details Name: Amber Lopez MRN: [id number] DOB: 07-Oct-1940 Today's Date: 03/22/2020    History of Present Illness 80 y.o. female with a PMHx of depression, HLD, HIV, HTN and DM presenting with acute onset of left hemiplegia, left facial droop, right eye deviation and aphasia. Pt received IV tPA and underwent R MCA revacularization on 01/29/2020. Pt also found to have large left pleural effusion with near complete L lung collapse and L apical mass. Chest tube placed on 01/31/2020. s/p trach on 02/11/20.     PT Comments    Patient not progressing towards goals today due to lethargy. No initiation noted with bed mobility requiring Max A of 2 to get to EOB as well as return to supine. Worked on static sitting balance, cervical AROM and there ex sitting EOB with Mod A and moments of Min A. Pt became slightly more alert once EOB but not able to sustain attention.. Following ~25% of motor commands. Will continue to follow and progress as able.   Follow Up Recommendations  SNF;Supervision/Assistance - 24 hour     Equipment Recommendations  Wheelchair (measurements PT);Wheelchair cushion (measurements PT);Hospital bed;3in1 (PT);Other (comment) (lift)    Recommendations for Other Services       Precautions / Restrictions Precautions Precautions: Fall Precaution Comments: Peg Required Braces or Orthoses: Splint/Cast Splint/Cast: soft elbow splint - L; B Prevalon boots Restrictions Weight Bearing Restrictions: No    Mobility  Bed Mobility Overal bed mobility: Needs Assistance Bed Mobility: Supine to Sit     Supine to sit: Max assist;+2 for physical assistance Sit to supine: +2 for physical assistance;Max assist   General bed mobility comments: No initiation of movement today as pt lethargic and difficult to arouse. Assist with LEs, trunk and scooting bottom to EOB and to return to supine. right lateral lean once upright.  Transfers                  General transfer comment: Deferred due to lethargy.  Ambulation/Gait                 Stairs             Wheelchair Mobility    Modified Rankin (Stroke Patients Only) Modified Rankin (Stroke Patients Only) Pre-Morbid Rankin Score: Slight disability Modified Rankin: Severe disability     Balance Overall balance assessment: Needs assistance Sitting-balance support: Feet unsupported;Bilateral upper extremity supported Sitting balance-Leahy Scale: Poor Sitting balance - Comments: trunk shortening on left, Worked on static sitting balance, cervical AROM and there ex sitting EOB with Mod A with moments of Min A. Postural control: Left lateral lean                                  Cognition Arousal/Alertness: Lethargic Behavior During Therapy: Flat affect Overall Cognitive Status: Difficult to assess Area of Impairment: Attention;Following commands;Safety/judgement;Problem solving                   Current Attention Level: Focused   Following Commands: Follows one step commands inconsistently;Follows one step commands with increased time Safety/Judgement: Decreased awareness of safety;Decreased awareness of deficits   Problem Solving: Slow processing;Decreased initiation;Difficulty sequencing;Requires verbal cues;Requires tactile cues General Comments: Follows ~25% of commands inconsistently.      Exercises General Exercises - Upper Extremity Shoulder Flexion: AAROM;Right;5 reps Shoulder ABduction: AAROM;Both;10 reps Elbow Flexion: AAROM;Both;10 reps Elbow Extension: AAROM;Both;10 reps General Exercises - Lower  Extremity Long Arc Quad: AROM;Both;10 reps;Seated (minimal AROM) Heel Slides: AAROM;Both;Supine;5 reps Hip ABduction/ADduction: AAROM;Both;Supine;5 reps    General Comments General comments (skin integrity, edema, etc.): No verbalizations throughout session today. VSS on RA.      Pertinent Vitals/Pain Pain Assessment:  Faces Faces Pain Scale: Hurts little more Pain Location: generalized with mobility, especially neck ROM Pain Descriptors / Indicators: Grimacing;Discomfort Pain Intervention(s): Monitored during session;Limited activity within patient's tolerance    Home Living                      Prior Function            PT Goals (current goals can now be found in the care plan section) Progress towards PT goals: Not progressing toward goals - comment (due to lethargy)    Frequency    Min 1X/week      PT Plan Current plan remains appropriate    Co-evaluation              AM-PAC PT "6 Clicks" Mobility   Outcome Measure  Help needed turning from your back to your side while in a flat bed without using bedrails?: Total Help needed moving from lying on your back to sitting on the side of a flat bed without using bedrails?: Total Help needed moving to and from a bed to a chair (including a wheelchair)?: Total Help needed standing up from a chair using your arms (e.g., wheelchair or bedside chair)?: Total Help needed to walk in hospital room?: Total Help needed climbing 3-5 steps with a railing? : Total 6 Click Score: 6    End of Session   Activity Tolerance: Patient limited by lethargy Patient left: in bed;with call bell/phone within reach;with SCD's reapplied Nurse Communication: Mobility status;Need for lift equipment PT Visit Diagnosis: Other abnormalities of gait and mobility (R26.89);Muscle weakness (generalized) (M62.81);Hemiplegia and hemiparesis;Other symptoms and signs involving the nervous system (R29.898);Unsteadiness on feet (R26.81) Hemiplegia - Right/Left: Left Hemiplegia - dominant/non-dominant: Non-dominant Hemiplegia - caused by: Cerebral infarction     Time: 1001-1016 PT Time Calculation (min) (ACUTE ONLY): 15 min  Charges:  $Therapeutic Activity: 8-22 mins                     Marisa Severin, PT, DPT Acute Rehabilitation Services Pager  [phone] Office [phone]       Marguarite Arbour A Sabra Heck 03/22/2020, 12:01 PM

## 2020-03-22 NOTE — Progress Notes (Signed)
[facility] PROGRESS NOTE  Amber Lopez RKY:[id number] DOB: 16-Dec-1940 DOA: 01/29/2020 PCP: Billie Ruddy, MD     11/16   11/22 Now decannulated   Status: Remains inpatient appropriate because:Altered mental status and Unsafe d/c plan   Dispo: The patient is from: Home              Anticipated d/c is to: SNF               Anticipated d/c date is: 1 day today 11/29-please see discharge summary dictated 11/27 by Dr. Reesa Chew              Patient currently is medically stable to d/c.   Code Status: DNR Family Communication: 11/15 spoke with daughter Amber Lopez; 11/23 and 11/24 LCSW spoke at length with patient's daughter regarding SNF bed offers.  DVT prophylaxis: Lovenox Vaccination status: Patient completed both doses of Pfizer Covid vaccine on 09/01/2019  Foley catheter: No  HPI: 79 year old female with past medical history for HIV/AIDS, hypertension, dyslipidemia, diabetes, depression, arthritis and breast cancer.  Patient presented to the ER on 10/7 with strokelike symptoms consisting of left hemiplegia, facial droop, aphasia.  CTA head neck showed emergent LVO at the right M1 segment with patient subsequently undergoing TPA and revascularization by neurology and IR.  She was admitted to the ICU postprocedure on mechanical ventilation.  Unfortunately patient's mentation and neurological status remained poor.  She was not able to follow commands and she was unable to wean from the ventilator.  Tracheostomy tube was placed on 10/20 and a PEG tube was placed on 10/29.  Unfortunately her neurological status has not improved.  Other complications since admission include a large left pleural effusion/left apical mass with pathology assistant with metastatic adenocarcinoma.  She has been denied for LTAC placement.  Palliative care has been discussing with patient family her poor prognosis and initially family requested patient be transferred to another facility but no clinical reason to  transfer therefore patient remains facility.  Subjective: Patient awake and verbally responsive.  Minimal oral communication noting patient had secretions in mouth and needed to spit out.   Objective: Vitals:   03/22/20 0825 03/22/20 0837  BP: (!) 128/54 106/83  Pulse:    Resp: 19   Temp:    SpO2:      Intake/Output Summary (Last 24 hours) at 03/22/2020 1149 Last data filed at 03/22/2020 0618 Gross per 24 hour  Intake 800 ml  Output 700 ml  Net 100 ml   Filed Weights   03/15/20 0459 03/16/20 0306 03/17/20 0540  Weight: 92 kg 90 kg 89.5 kg    Exam:  Constitutional: NAD, calm Respiratory: clear to auscultation bilaterally. Normal respiratory effort. RA Cardiovascular: Regular rate and rhythm, No lower extremity edema.  Extremities pink and warm  Abdomen: Non tender, Bowel sounds PEG tube; tolerating D1 diet when fed Musculoskeletal:  No spasticity observed with movement Neurologic: CN 2-12 grossly intact.  Sensation intact. Left hemineglect persists- left side weaker than right. Psychiatric: Alert and oriented x name and place.  Confused/pleasant.   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 -OT recommended pillow splint at bedtime 2/2 ongoing hemineglect and increased risk for LUE contracture -Continue SLP (swallowing and speech/cognition) at nursing facility -Last PT evaluation on 11/22: Patient continues to require max assist +2 for supine to sit.  Having difficulty sitting on edge of bed secondary to underlying air mattress.  Again  requires max assist +2 for lateral scoot transfer performed from bed to chair.  Patient was able to follow basic commands.  Progressing slowly but engaged in therapy sessions. -Continue PT/OT at nursing facility  Acute ventilatory dependent respiratory failure with hypoxemia/Inability to clear secretions/requires tracheostomy  tube/COPD -11/19 decannulated by trach team -Based on preadmission medications does not appear to have been on any short acting bronchodilators or LABA prior to admission  Type 2 diabetes mellitus -A1c 6.5 on 10/7. -11/22 CBGs trending up now that more consistent intake of D1 diet plus TF-continue Levemir 20 units daily along with regular NovoLog insulin 3 units meal coverage -Continue SSI -Sugars are variable depending on how much additional oral intake patient has over tube feedings in regards to D1 diet.  Stage IV adenocarcinoma of the left lung with left pleural effusion  -CT of chest at time of admission: w/ large left pleural effusion with near complete left lung collapse. Left apical mass measuring 3.5 x 3.3 cm suspicious for bronchogenic carcinoma -Pleural fluid pathology consistent with metastatic adenocarcinoma -poor performance status precludes chemotherapy with metastatic disease; 11/15 reinforced this with patient's daughter by telephone -Needs to follow-up with oncology as an outpatient if improves clinically (was evaluated by Dr. Narda Rutherford during this hospitalization)  Dysphagia/ Nutrition Status: Nutrition Status: Nutrition Problem: Moderate Malnutrition Etiology: chronic illness (HIV) Signs/Symptoms: mild muscle depletion, moderate muscle depletion, mild fat depletion, moderate fat depletion Interventions: Ensure Enlive (each supplement provides 350kcal and 20 grams of protein), Tube feeding, MVI-patient also tolerating D1 diet when fed-decannulated so hopeful can progress diet soon; consideration given to transitioning to bolus tube feeds in the event daughter request to take patient home with her Estimated body mass index is 41.24 kg/m as calculated from the following:   Height as of this encounter: 4\' 10"  (1.473 m).   Weight as of this encounter: 89.5 kg.  Acute diarrhea -Resolved   Decubitus ulcer not POA Pressure Injury 02/16/20 Perineum Right Stage 2 -   Partial thickness loss of dermis presenting as a shallow open injury with a red, pink wound bed without slough. 2cm x 1cm pink (Active)  Date First Assessed/Time First Assessed: 02/16/20 1200   Location: Perineum  Location Orientation: Right  Staging: Stage 2 -  Partial thickness loss of dermis presenting as a shallow open injury with a red, pink wound bed without slough.  Wound Descri...    Assessments 02/16/2020 12:00 PM 03/21/2020  8:00 PM  Dressing Type Moisture barrier Foam - Lift dressing to assess site every shift  Dressing -- Clean;Dry;Intact  Dressing Change Frequency -- PRN  State of Healing -- Epithelialized  Site / Wound Assessment Clean;Pink Pink  Peri-wound Assessment Intact --  Wound Length (cm) 2 cm --  Wound Width (cm) 1 cm --  Wound Depth (cm) 0 cm --  Wound Surface Area (cm^2) 2 cm^2 --  Wound Volume (cm^3) 0 cm^3 --  Margins Unattached edges (unapproximated) --  Drainage Amount Scant None  Treatment Cleansed --     No Linked orders to display      Other problems: Hypertension/left ventricular diastolic dysfunction -BP trending upward over the past several days.  We will continue carvedilol 3.125 mg twice daily and have added low-dose hydralazine 25 mg 3 times daily as of date of discharge. -No clinical signs consistent with heart failure  HIV/AIDS -Continue home medications including: atovaquone, dolutegravir and descovy; discussing with pharmacy and ID about transitioning to more affordable option since we are pursuing SNF care  and expensive medications may be barrier to placement  Chronic normocytic anemia -Hemoglobin stable at 9.0  GOC  -Family had requested transfer to another facility when informed poor prognosis.  At this time no clinical reason to transfer to other facility.  Palliative care following intermittently -LTAC denied -Case management assisting with SNF placement  History of breast cancer Was on Arimidex prior to admission  Data  Reviewed: Basic Metabolic Panel: Recent Labs  Lab 03/18/20 0255 03/19/20 0412 03/20/20 0312 03/21/20 0242 03/22/20 0413  NA 127* 132* 130* 131* 131*  K 4.8 5.1 5.7* 5.0 5.0  CL 90* 96* 95* 94* 93*  CO2 25 25 27 28 27   GLUCOSE 124* 63* 54* 86 85  BUN 26* 24* 24* 28* 27*  CREATININE 0.91 0.92 0.87 0.85 1.02*  CALCIUM 9.0 9.0 8.9 8.9 9.2   Liver Function Tests: No results for input(s): AST, ALT, ALKPHOS, BILITOT, PROT, ALBUMIN in the last 168 hours. No results for input(s): LIPASE, AMYLASE in the last 168 hours. No results for input(s): AMMONIA in the last 168 hours. CBC: No results for input(s): WBC, NEUTROABS, HGB, HCT, MCV, PLT in the last 168 hours. Cardiac Enzymes: No results for input(s): CKTOTAL, CKMB, CKMBINDEX, TROPONINI in the last 168 hours. BNP (last 3 results) No results for input(s): BNP in the last 8760 hours.  ProBNP (last 3 results) Recent Labs    08/07/19 1422  PROBNP 110.0*    CBG: Recent Labs  Lab 03/22/20 0010 03/22/20 0339 03/22/20 0616 03/22/20 0805 03/22/20 1146  GLUCAP 270* 60* 127* 323* 212*    Recent Results (from the past 240 hour(s))  Resp Panel by RT-PCR (Flu A&B, Covid) Nasopharyngeal Swab     Status: None   Collection Time: 03/19/20  4:18 PM   Specimen: Nasopharyngeal Swab; Nasopharyngeal(NP) swabs in vial transport medium  Result Value Ref Range Status   SARS Coronavirus 2 by RT PCR NEGATIVE NEGATIVE Final    Comment: (NOTE) SARS-CoV-2 target nucleic acids are NOT DETECTED.  The SARS-CoV-2 RNA is generally detectable in upper respiratory specimens during the acute phase of infection. The lowest concentration of SARS-CoV-2 viral copies this assay can detect is 138 copies/mL. A negative result does not preclude SARS-Cov-2 infection and should not be used as the sole basis for treatment or other patient management decisions. A negative result may occur with  improper specimen collection/handling, submission of specimen  other than nasopharyngeal swab, presence of viral mutation(s) within the areas targeted by this assay, and inadequate number of viral copies(<138 copies/mL). A negative result must be combined with clinical observations, patient history, and epidemiological information. The expected result is Negative.  Fact Sheet for Patients:  EntrepreneurPulse.com.au  Fact Sheet for Healthcare Providers:  IncredibleEmployment.be  This test is no t yet approved or cleared by the [place] FDA and  has been authorized for detection and/or diagnosis of SARS-CoV-2 by FDA under an Emergency Use Authorization (EUA). This EUA will remain  in effect (meaning this test can be used) for the duration of the COVID-19 declaration under Section 564(b)(1) of the Act, 21 U.S.C.section 360bbb-3(b)(1), unless the authorization is terminated  or revoked sooner.       Influenza A by PCR NEGATIVE NEGATIVE Final   Influenza B by PCR NEGATIVE NEGATIVE Final    Comment: (NOTE) The Xpert Xpress SARS-CoV-2/FLU/RSV plus assay is intended as an aid in the diagnosis of influenza from Nasopharyngeal swab specimens and should not be used as a sole basis for treatment. Nasal washings  and aspirates are unacceptable for Xpert Xpress SARS-CoV-2/FLU/RSV testing.  Fact Sheet for Patients: EntrepreneurPulse.com.au  Fact Sheet for Healthcare Providers: IncredibleEmployment.be  This test is not yet approved or cleared by the [place] FDA and has been authorized for detection and/or diagnosis of SARS-CoV-2 by FDA under an Emergency Use Authorization (EUA). This EUA will remain in effect (meaning this test can be used) for the duration of the COVID-19 declaration under Section 564(b)(1) of the Act, 21 U.S.C. section 360bbb-3(b)(1), unless the authorization is terminated or revoked.  Performed at [facility], [place] [address], [place],  [place] [address]      Studies: DG Chest Port 1 View  Result Date: 03/22/2020 CLINICAL DATA:  Dyspnea, code stroke EXAM: PORTABLE CHEST 1 VIEW COMPARISON:  Portable exam 4481 hours compared to 03/03/2020 FINDINGS: Normal heart size and mediastinal contours. Abnormal soft tissue at medial LEFT apex, corresponding to a mass on prior CT. Small LEFT pleural effusion and LEFT basilar atelectasis, decreased from prior exam. Remaining lungs clear. Surgical clips RIGHT axilla. Osseous demineralization with degenerative changes of LEFT AC joint and chronic LEFT rotator cuff tear. IMPRESSION: Small LEFT pleural effusion and LEFT basilar atelectasis, slightly improved. Persistent LEFT apex mass. Electronically Signed   By: Lavonia Dana M.D.   On: 03/22/2020 08:30    Scheduled Meds: . aspirin  81 mg Per Tube Daily  . atovaquone  1,500 mg Per Tube Q breakfast  . carvedilol  3.125 mg Per Tube BID WC  . chlorhexidine  15 mL Mouth Rinse BID  . Chlorhexidine Gluconate Cloth  6 each Topical Daily  . dicyclomine  10 mg Per Tube TID AC & HS  . dolutegravir  50 mg Per Tube Daily  . emtricitabine-tenofovir AF  1 tablet Per Tube Daily  . enoxaparin (LOVENOX) injection  40 mg Subcutaneous Q24H  . feeding supplement  237 mL Oral BID BM  . feeding supplement (OSMOLITE 1.5 CAL)  237 mL Per Tube 5 X Daily  . free water  200 mL Per Tube Q6H  . hydrALAZINE  25 mg Per Tube Q8H  . insulin aspart  0-15 Units Subcutaneous Q4H  . insulin aspart  3 Units Subcutaneous TID WC  . insulin detemir  20 Units Subcutaneous Daily  . QUEtiapine  12.5 mg Per Tube QHS  . rosuvastatin  10 mg Per NG tube Daily   Continuous Infusions: . sodium chloride 250 mL (02/11/20 2212)  . sodium chloride Stopped (02/09/20 0556)    Active Problems:   DNR (do not resuscitate) discussion   Acute right MCA stroke ([place])   Middle cerebral artery embolism, right   Malnutrition of moderate degree   Adenocarcinoma of left lung ([place])   Adenocarcinoma  ([place])   Palliative care by specialist   Infiltrate noted on imaging study   Pressure injury of skin   Endotracheal tube present   Pleural effusion on left   Acute respiratory failure with hypoxia ([place])   Tracheostomy in place ([place])   Ventilator dependent ([place])   Oropharyngeal dysphagia   Consultants:  Neurology  Interventional radiology  PCCM  Oncology  Palliative care  Procedures:  Echocardiogram  EEG  Core track placement  Tracheostomy tube  PEG tube  Antibiotics: Anti-infectives (From admission, onward)   Start     Dose/Rate Route Frequency Ordered Stop   03/20/20 0000  dolutegravir (TIVICAY) 50 MG tablet       Note to Pharmacy: OK PER TUBE PER ID PHARMACIST AT [facility]  50 mg Oral Daily 03/20/20 1155     03/19/20 0000  dolutegravir (TIVICAY) 50 MG tablet  Status:  Discontinued        50 mg Oral Daily 03/19/20 1728 03/19/20    03/19/20 0000  emtricitabine-tenofovir AF (DESCOVY) 200-25 MG tablet  Status:  Discontinued        1 tablet Per Tube Daily 03/19/20 1728 03/19/20    03/19/20 0000  dolutegravir (TIVICAY) 50 MG tablet  Status:  Discontinued        50 mg Oral Daily 03/19/20 1729 03/20/20    03/19/20 0000  emtricitabine-tenofovir AF (DESCOVY) 200-25 MG tablet        1 tablet Per Tube Daily 03/19/20 1729     03/18/20 0000  atovaquone (MEPRON) 750 MG/5ML suspension        1,500 mg Per Tube Daily with breakfast 03/17/20 1412     03/18/20 0000  dolutegravir (TIVICAY) 50 MG tablet  Status:  Discontinued        50 mg Oral Daily 03/17/20 1412 03/19/20    03/18/20 0000  emtricitabine-tenofovir AF (DESCOVY) 200-25 MG tablet  Status:  Discontinued        1 tablet Per Tube Daily 03/17/20 1412 03/19/20    03/15/20 1000  emtricitabine-tenofovir AF (DESCOVY) 200-25 MG per tablet 1 tablet        1 tablet Per Tube Daily 03/15/20 0832     03/13/20 1000  dolutegravir (TIVICAY) tablet 50 mg        50 mg Per Tube Daily 03/13/20 0712     03/05/20 1000  dolutegravir (TIVICAY)  tablet 50 mg  Status:  Discontinued        50 mg Oral Daily 03/04/20 1008 03/13/20 0712   03/05/20 1000  emtricitabine-tenofovir AF (DESCOVY) 200-25 MG per tablet 1 tablet  Status:  Discontinued        1 tablet Oral Daily 03/04/20 1008 03/15/20 0832   03/04/20 0930  atovaquone (MEPRON) 750 MG/5ML suspension 1,500 mg        1,500 mg Per Tube Daily with breakfast 03/04/20 0833     03/04/20 0800  Darunavir-Cobicisctat-Emtricitabine-Tenofovir Alafenamide (SYMTUZA) 800-150-200-10 MG TABS 1 tablet  Status:  Discontinued        1 tablet Oral Daily with breakfast 03/03/20 1345 03/04/20 1008   02/12/20 1000  vancomycin (VANCOREADY) IVPB 1250 mg/250 mL        1,250 mg 166.7 mL/hr over 90 Minutes Intravenous Every 24 hours 02/11/20 1509 02/18/20 1438   02/07/20 1310  vancomycin (VANCOREADY) IVPB 750 mg/150 mL  Status:  Discontinued        750 mg 150 mL/hr over 60 Minutes Intravenous Every 12 hours 02/07/20 1310 02/11/20 1509   02/06/20 0000  vancomycin (VANCOREADY) IVPB 500 mg/100 mL  Status:  Discontinued        500 mg 100 mL/hr over 60 Minutes Intravenous Every 12 hours 02/05/20 1141 02/07/20 1310   02/05/20 1300  ceFEPIme (MAXIPIME) 2 g in sodium chloride 0.9 % 100 mL IVPB        2 g 200 mL/hr over 30 Minutes Intravenous Every 12 hours 02/05/20 1127 02/11/20 2140   02/05/20 1300  atovaquone (MEPRON) 750 MG/5ML suspension 1,500 mg  Status:  Discontinued        1,500 mg Per Tube Daily with breakfast 02/05/20 1137 03/03/20 1345   02/05/20 1230  vancomycin (VANCOCIN) IVPB 1000 mg/200 mL premix        1,000 mg 200 mL/hr over 60  Minutes Intravenous  Once 02/05/20 1127 02/05/20 1304   01/30/20 1000  dolutegravir (TIVICAY) tablet 50 mg  Status:  Discontinued        50 mg Oral Daily 01/30/20 0941 03/03/20 1345   01/30/20 1000  emtricitabine-tenofovir AF (DESCOVY) 200-25 MG per tablet 1 tablet  Status:  Discontinued        1 tablet Per Tube Daily 01/30/20 0941 03/03/20 1345   01/29/20 1600   emtricitabine-tenofovir AF (DESCOVY) 200-25 MG per tablet 1 tablet  Status:  Discontinued        1 tablet Per Tube Daily 01/29/20 1437 01/29/20 1507   01/29/20 1600  dolutegravir (TIVICAY) tablet 50 mg  Status:  Discontinued        50 mg Oral Daily 01/29/20 1437 01/29/20 1507   01/29/20 1415  bictegravir-emtricitabine-tenofovir AF (BIKTARVY) 50-200-25 MG per tablet 1 tablet  Status:  Discontinued        1 tablet Oral Daily 01/29/20 1408 01/29/20 1437   01/29/20 1045  amoxicillin-clavulanate (AUGMENTIN) 875-125 MG per tablet 1 tablet  Status:  Discontinued        1 tablet Oral 2 times daily 01/29/20 1041 01/29/20 1510   01/29/20 1045  bictegravir-emtricitabine-tenofovir AF (BIKTARVY) 50-200-25 MG per tablet 1 tablet  Status:  Discontinued        1 tablet Oral Daily 01/29/20 1041 01/29/20 1042   01/29/20 0913  ceFAZolin (ANCEF) 2-4 GM/100ML-% IVPB       Note to Pharmacy: Tressie Stalker   : cabinet override      01/29/20 0913 01/29/20 2114       Time spent: 20 minutes    Erin Hearing ANP  [facility] Pager [phone]. If 7PM-7AM, please contact night-coverage at [email or web address] 03/22/2020, 11:49 AM  LOS: 53 days

## 2020-03-22 NOTE — TOC Progression Note (Addendum)
Transition of Care [facility]) - Progression Note    Patient Details  Name: CHANEQUA SPEES MRN: [id number] Date of Birth: 19-Mar-1941  Transition of Care [facility]) CM/SW [place], LCSW Phone Number: 03/22/2020, 9:21 AM  Clinical Narrative:    9:20am-Pasrr under level II review. CSW spoke with Pasrr Screener, B. Akins. She is working on the screening. In the meantime, CSW waiting to hear back from [facility] on if patient can go ahead and discharge without the pasrr number being published in the system since that can take up to two days; waiting on a response.   [facility] accepting patient with pending pasrr number since screen has been completed. RNCM picked up patient's 2 HIV medications and they are in a packet on the patient's hard chart ready for PTAR. Will schedule once COVID test results.    Expected Discharge Plan: [place] Barriers to Discharge: Continued Medical Work up, SNF Pending bed offer  Expected Discharge Plan and Services Expected Discharge Plan: [place] In-house Referral: Clinical Social Work Discharge Planning Services: CM Consult Post Acute Care Choice: [place] arrangements for the past 2 months: Single Family Home Expected Discharge Date: 03/20/20                                     Social Determinants of Health (SDOH) Interventions    Readmission Risk Interventions No flowsheet data found.

## 2020-03-22 NOTE — TOC Transition Note (Signed)
Transition of Care [facility]) - CM/SW Discharge Note   Patient Details  Name: Amber Lopez MRN: [id number] Date of Birth: January 20, 1941  Transition [facility]) CM/SW Contact:  [name], LCSW Phone Number: 03/22/2020, 12:39 PM   Clinical Narrative:    Patient will DC to: [facility]  Anticipated DC date: 03/22/20 Family notified: Daughter Transport by: [name] 2pm  Medications are in packet to go with patient!  Per MD patient ready for DC to [facility]. RN to call report prior to discharge [phone] Room [phone] ). RN, patient, patient's family, and facility notified of DC. Discharge Summary and FL2 sent to facility. DC packet on chart. Ambulance transport requested for patient.   CSW will sign off for now as social work intervention is no longer needed. Please consult [place] again if new needs arise.      Final next level of care: Skilled Nursing Facility Barriers to Discharge: Barriers Resolved   Patient Goals and CMS Choice Patient states their goals for this hospitalization and ongoing recovery are:: Pt unable to participate in discharge planning due to disorientation. CMS Medicare.gov Compare Post Acute Care list provided to:: Patient Represenative (must comment) (Daughter, [name]) Choice offered to / list presented to : Adult Children  Discharge Placement   Existing PASRR number confirmed :  (screen completed; [facility] [place] will obtain number)          Patient chooses bed at: [name] Patient to be transferred to facility by: [name] Name of family member notified: Daughter Patient and family notified of of transfer: 03/22/20  Discharge Plan and Services In-house Referral: Clinical Social Work Discharge Planning Services: [employer] Consult Post Acute Care Choice: [place]                               Social Determinants of Health (SDOH) Interventions     Readmission Risk Interventions No flowsheet data found.

## 2020-03-22 NOTE — Progress Notes (Signed)
Inpatient Diabetes Program Recommendations  AACE/ADA: New Consensus Statement on Inpatient Glycemic Control (2015)  Target Ranges:  Prepandial:   less than 140 mg/dL      Peak postprandial:   less than 180 mg/dL (1-2 hours)      Critically ill patients:  140 - 180 mg/dL   Lab Results  Component Value Date   GLUCAP 323 (H) 03/22/2020   HGBA1C 6.5 (H) 01/29/2020    Review of Glycemic Control Results for Amber Lopez, Amber Lopez (MRN [id number]) as of 03/22/2020 10:43  Ref. Range 03/21/2020 20:39 03/22/2020 00:10 03/22/2020 03:39 03/22/2020 06:16 03/22/2020 08:05  Glucose-Capillary Latest Ref Range: 70 - 99 mg/dL 212 (H) 270 (H) 60 (L) 127 (H) 323 (H)   Diabetes history: DM 2 Outpatient Diabetes medications:  None Current orders for Inpatient glycemic control:  Levemir 20 units daily Novolog moderate q 4 hours (MN, 0400, 0800,1200, 1600, 2000) Novolog 3 units tid with meals  Inpatient Diabetes Program Recommendations:    Per RN, patient is eating "a little" with meals.  She is currently receiving bolus tube feeds at 0600, 1000, 1400, 1800, and 2200 (5 times a day).  Recommend changing Novolog correction to match with bolus feeds (5 times a day at above times).  This should help so that post- feed blood sugars are not being covered which could increase risk of hypoglycemia when CHO clears.  If blood sugars continue to be elevated, consider tube feed coverage at the same time as boluses as well?  Thanks  Adah Perl, RN, BC-ADM Inpatient Diabetes Coordinator Pager [phone] (8a-5p)

## 2020-03-22 NOTE — Progress Notes (Signed)
Attempted to call report to the [place] 3 times without success.

## 2020-03-24 DIAGNOSIS — I69354 Hemiplegia and hemiparesis following cerebral infarction affecting left non-dominant side: Secondary | ICD-10-CM | POA: Diagnosis not present

## 2020-03-24 DIAGNOSIS — I6340 Cerebral infarction due to embolism of unspecified cerebral artery: Secondary | ICD-10-CM | POA: Diagnosis not present

## 2020-03-24 DIAGNOSIS — J449 Chronic obstructive pulmonary disease, unspecified: Secondary | ICD-10-CM | POA: Diagnosis not present

## 2020-03-24 DIAGNOSIS — E119 Type 2 diabetes mellitus without complications: Secondary | ICD-10-CM | POA: Diagnosis not present

## 2020-03-24 DIAGNOSIS — I69320 Aphasia following cerebral infarction: Secondary | ICD-10-CM | POA: Diagnosis not present

## 2020-03-25 DIAGNOSIS — Z431 Encounter for attention to gastrostomy: Secondary | ICD-10-CM | POA: Diagnosis not present

## 2020-03-25 DIAGNOSIS — I639 Cerebral infarction, unspecified: Secondary | ICD-10-CM | POA: Diagnosis not present

## 2020-03-25 DIAGNOSIS — J9690 Respiratory failure, unspecified, unspecified whether with hypoxia or hypercapnia: Secondary | ICD-10-CM | POA: Diagnosis not present

## 2020-03-25 DIAGNOSIS — Z593 Problems related to living in residential institution: Secondary | ICD-10-CM | POA: Diagnosis not present

## 2020-03-25 DIAGNOSIS — Z4682 Encounter for fitting and adjustment of non-vascular catheter: Secondary | ICD-10-CM | POA: Diagnosis not present

## 2020-03-25 DIAGNOSIS — Z4659 Encounter for fitting and adjustment of other gastrointestinal appliance and device: Secondary | ICD-10-CM | POA: Diagnosis not present

## 2020-03-25 DIAGNOSIS — R6889 Other general symptoms and signs: Secondary | ICD-10-CM | POA: Diagnosis not present

## 2020-03-25 DIAGNOSIS — Z743 Need for continuous supervision: Secondary | ICD-10-CM | POA: Diagnosis not present

## 2020-03-25 DIAGNOSIS — K9423 Gastrostomy malfunction: Secondary | ICD-10-CM | POA: Diagnosis not present

## 2020-03-25 DIAGNOSIS — C3490 Malignant neoplasm of unspecified part of unspecified bronchus or lung: Secondary | ICD-10-CM | POA: Diagnosis not present

## 2020-03-25 DIAGNOSIS — R531 Weakness: Secondary | ICD-10-CM | POA: Diagnosis not present

## 2020-03-26 DIAGNOSIS — R410 Disorientation, unspecified: Secondary | ICD-10-CM | POA: Diagnosis not present

## 2020-03-26 DIAGNOSIS — R404 Transient alteration of awareness: Secondary | ICD-10-CM | POA: Diagnosis not present

## 2020-03-26 DIAGNOSIS — Z743 Need for continuous supervision: Secondary | ICD-10-CM | POA: Diagnosis not present

## 2020-03-26 DIAGNOSIS — R6889 Other general symptoms and signs: Secondary | ICD-10-CM | POA: Diagnosis not present

## 2020-03-27 DIAGNOSIS — I10 Essential (primary) hypertension: Secondary | ICD-10-CM | POA: Diagnosis not present

## 2020-03-27 DIAGNOSIS — E559 Vitamin D deficiency, unspecified: Secondary | ICD-10-CM | POA: Diagnosis not present

## 2020-03-29 DIAGNOSIS — E559 Vitamin D deficiency, unspecified: Secondary | ICD-10-CM | POA: Diagnosis not present

## 2020-03-29 DIAGNOSIS — I10 Essential (primary) hypertension: Secondary | ICD-10-CM | POA: Diagnosis not present

## 2020-03-31 ENCOUNTER — Ambulatory Visit: Payer: Medicare Other | Admitting: Podiatry

## 2020-04-06 ENCOUNTER — Telehealth: Payer: Self-pay | Admitting: Hematology and Oncology

## 2020-04-06 ENCOUNTER — Other Ambulatory Visit: Payer: Self-pay | Admitting: Family Medicine

## 2020-04-06 NOTE — Telephone Encounter (Signed)
Received a call from [place] at the [facility] in [place] to schedule a consult for metastatic lung cancer. Pt was seen in the hospital by Dr. Lorenso Courier in October. Pt is currently admitted in the hospital and has been scheduled to see Dr. Lorenso Courier on 12/23 at [place] will notify the pt's daughter and inform her Ms. Eilert needs to arrive 30 minutes early.

## 2020-04-15 ENCOUNTER — Other Ambulatory Visit: Payer: Self-pay

## 2020-04-15 ENCOUNTER — Inpatient Hospital Stay: Payer: Medicare Other | Attending: Hematology and Oncology | Admitting: Hematology and Oncology

## 2020-04-15 ENCOUNTER — Inpatient Hospital Stay: Payer: Medicare Other

## 2020-04-15 VITALS — BP 125/80 | HR 80 | Temp 97.7°F | Resp 18 | Ht <= 58 in

## 2020-04-15 DIAGNOSIS — J910 Malignant pleural effusion: Secondary | ICD-10-CM

## 2020-04-15 DIAGNOSIS — Z794 Long term (current) use of insulin: Secondary | ICD-10-CM | POA: Insufficient documentation

## 2020-04-15 DIAGNOSIS — I10 Essential (primary) hypertension: Secondary | ICD-10-CM | POA: Insufficient documentation

## 2020-04-15 DIAGNOSIS — I252 Old myocardial infarction: Secondary | ICD-10-CM

## 2020-04-15 DIAGNOSIS — Z21 Asymptomatic human immunodeficiency virus [HIV] infection status: Secondary | ICD-10-CM | POA: Insufficient documentation

## 2020-04-15 DIAGNOSIS — E785 Hyperlipidemia, unspecified: Secondary | ICD-10-CM | POA: Insufficient documentation

## 2020-04-15 DIAGNOSIS — F32A Depression, unspecified: Secondary | ICD-10-CM | POA: Diagnosis not present

## 2020-04-15 DIAGNOSIS — Z7189 Other specified counseling: Secondary | ICD-10-CM

## 2020-04-15 DIAGNOSIS — C3492 Malignant neoplasm of unspecified part of left bronchus or lung: Secondary | ICD-10-CM

## 2020-04-15 DIAGNOSIS — Z7982 Long term (current) use of aspirin: Secondary | ICD-10-CM | POA: Diagnosis not present

## 2020-04-15 DIAGNOSIS — B20 Human immunodeficiency virus [HIV] disease: Secondary | ICD-10-CM | POA: Diagnosis not present

## 2020-04-15 DIAGNOSIS — Z7401 Bed confinement status: Secondary | ICD-10-CM | POA: Insufficient documentation

## 2020-04-15 DIAGNOSIS — E119 Type 2 diabetes mellitus without complications: Secondary | ICD-10-CM | POA: Diagnosis not present

## 2020-04-15 DIAGNOSIS — C3490 Malignant neoplasm of unspecified part of unspecified bronchus or lung: Secondary | ICD-10-CM

## 2020-04-15 NOTE — Progress Notes (Signed)
[place] [phone]   Fax:([phone] [place] NOTE  Patient Care Team: Billie Ruddy, MD as PCP - General (Family Medicine)  Hematological/Oncological History # Stage IV Adenocarcinoma of the Lung 01/30/2020: CT C/A/P during admission for CVA showed near complete left lung collapse. There is persistent concern of a left apical mass measuring 3.5 x 3.3 cm, suspicious for bronchogenic carcinoma 01/31/2020: thoracentesis performed with fluid sent for cytology. Findings were consistent with metastatic adenocarcinoma 02/04/2020: attempted inpatient Oncology consult for [place] discussions, however the patient's family was not accessible.  03/22/2020: patient d/c from [facility] to [facility] 04/15/2020: patient to formally establish care with Dr. Lorenso Courier  CHIEF COMPLAINTS/PURPOSE OF CONSULTATION:  "Stage IV Adenocarcinoma of the Lung "  HISTORY OF PRESENTING ILLNESS:  Amber Lopez 79 y.o. female with medical history significant for HIV (on ART therapy), DM type II, HLD, and HTN who presents for evaluation of stage IV adenocarcinoma of the lung.   On review of the previous records Amber Lopez in a prolonged hospitalization from 01/29/2020 until 03/22/2020.  During the course of that hospitalization the patient was noted to have had a major stroke as part of her work-up proceed to CTA of the neck.  During that CT of the neck the patient was noted to have a left upper lobe mass which was subsequently better reviewed on a CT chest abdomen pelvis which showed a 3.5 x 3.3 apical mass suspicious for bronchogenic carcinoma.  The patient subsequently had a thoracentesis performed due to a pleural effusion noted on that scan and the cytology was consistent with metastatic adenocarcinoma.  During that hospitalization the patient was evaluated by neurology and goals of care discussions were begun.  The patient and her family wanted to persist with all available  measures and therefore the patient was eventually transferred to an LTAC with a trach collar and a PEG tube.  She presents today for further evaluation and discussion regarding the treatment of her adenocarcinoma of the lung.  On exam today Amber Lopez is not able to answer questions in any detail.  She is able to state her first and last name and when asked if she has any pain she does say no.  Throughout the course of the visit the patient talks, however there is no focus or clarity to her speech.  The bulk of the discussion was with her daughter who presented with her today.  The patient's daughter notes that she was speaking much more clearly approximately 2 to 3 weeks ago but is had a marked decline in that time.  The patient has been badly debilitated by her stroke and is currently bedbound.  She currently favors her right side and leans to the left.  She requires nutrition via PEG tube but fortunately does no longer have a trach collar in place.  I had a detailed discussion with the patient's daughter regarding cancer care treatment and the plan moving forward.  We discussed the nature of stage IV disease and the overall poor prognosis.  I also expressed my concern to the patient's daughter that the patient is 23 years old and badly deconditioned and would not likely benefit from care chemotherapy or radiation therapy.  The daughter voiced her understanding of this plan but did want to have a CT scan performed to see how this mass had progressed in the interim since the last scan in October.  I was clear with her that I did not think  this would change management, however for prognostic purposes the patient's daughter requested that we perform this.  I think this is reasonable at this time.  An ROS was not able to be obtained to the patient status.  MEDICAL HISTORY:  Past Medical History:  Diagnosis Date  . Arthritis   . Depression   . Diabetes mellitus without complication ([place])   . Eating  disorder   . Hyperlipidemia   . Hypertension   . Migraines     SURGICAL HISTORY: Past Surgical History:  Procedure Laterality Date  . BREAST BIOPSY    . BREAST LUMPECTOMY Right    Years ago / Pt's daughter thinks it was cancer  . IR CT HEAD LTD  01/29/2020  . IR PERCUTANEOUS ART THROMBECTOMY/INFUSION INTRACRANIAL INC DIAG ANGIO  01/29/2020  . PEG PLACEMENT N/A 02/20/2020   Procedure: PERCUTANEOUS ENDOSCOPIC GASTROSTOMY (PEG) PLACEMENT;  Surgeon: Jesusita Oka, MD;  Location: [facility];  Service: General;  Laterality: N/A;  . RADIOLOGY WITH ANESTHESIA N/A 01/29/2020   Procedure: IR WITH ANESTHESIA - CODE STROKE;  Surgeon: Radiologist, Medication, MD;  Location: [facility];  Service: Radiology;  Laterality: N/A;    SOCIAL HISTORY: Social History   Socioeconomic History  . Marital status: Single    Spouse name: Not on file  . Number of children: Not on file  . Years of education: Not on file  . Highest education level: Not on file  Occupational History  . Not on file  Tobacco Use  . Smoking status: Never Smoker  . Smokeless tobacco: Never Used  Vaping Use  . Vaping Use: Never used  Substance and Sexual Activity  . Alcohol use: Never  . Drug use: Never  . Sexual activity: Not Currently  Other Topics Concern  . Not on file  Social History Narrative  . Not on file   Social Determinants of Health   Financial Resource Strain: Not on file  Food Insecurity: Not on file  Transportation Needs: Not on file  Physical Activity: Not on file  Stress: Not on file  Social Connections: Not on file  Intimate Partner Violence: Not on file    FAMILY HISTORY: Family History  Problem Relation Age of Onset  . Breast cancer Sister 34    ALLERGIES:  is allergic to sulfa antibiotics.  MEDICATIONS:  Current Outpatient Medications  Medication Sig Dispense Refill  . acetaminophen (TYLENOL) 325 MG tablet Place 2 tablets (650 mg total) into feeding tube every 4 (four) hours as needed  for mild pain (or temp > 37.5 C (99.5 F)).    [name] Kitchen anastrozole (ARIMIDEX) 1 MG tablet Place 1 tablet (1 mg total) into feeding tube daily.    [name] Kitchen aspirin 81 MG chewable tablet Place 1 tablet (81 mg total) into feeding tube daily.    [name] Kitchen atovaquone (MEPRON) 750 MG/5ML suspension Place 10 mLs (1,500 mg total) into feeding tube daily with breakfast. 210 mL 0  . carvedilol (COREG) 3.125 MG tablet Place 1 tablet (3.125 mg total) into feeding tube 2 (two) times daily with a meal.    . dicyclomine (BENTYL) 10 MG/5ML solution Place 5 mLs (10 mg total) into feeding tube 4 (four) times daily -  before meals and at bedtime. 240 mL 12  . dolutegravir (TIVICAY) 50 MG tablet Take 1 tablet (50 mg total) by mouth daily. OK PER TUBE 30 tablet 0  . emtricitabine-tenofovir AF (DESCOVY) 200-25 MG tablet Place 1 tablet into feeding tube daily. 30 tablet 0  . feeding  supplement (ENSURE ENLIVE / ENSURE PLUS) LIQD Take 237 mLs by mouth 2 (two) times daily between meals. 237 mL 12  . hydrALAZINE (APRESOLINE) 25 MG tablet Place 1 tablet (25 mg total) into feeding tube 3 (three) times daily.    . insulin aspart (NOVOLOG) 100 UNIT/ML injection Inject 0-15 Units into the skin every 4 (four) hours. Correction coverage: Moderate (average weight, post-op)  CBG < 70: Implement Hypoglycemia Standing Orders and refer to Hypoglycemia Standing Orders sidebar report  CBG 70 - 120: 0 units  CBG 121 - 150: 2 units  CBG 151 - 200: 3 units  CBG 201 - 250: 5 units  CBG 251 - 300: 8 units  CBG 301 - 350: 11 units  CBG 351 - 400: 15 units  CBG > 400 call MD and obtain STAT lab verification 10 mL 11  . insulin detemir (LEVEMIR) 100 UNIT/ML injection Inject 0.2 mLs (20 Units total) into the skin daily. 10 mL 11  . Nutritional Supplements (FEEDING SUPPLEMENT, OSMOLITE 1.5 CAL,) LIQD Place 237 mLs into feeding tube 5 (five) times daily.  0  . QUEtiapine (SEROQUEL) 25 MG tablet Place 0.5 tablets (12.5 mg total) into feeding tube at bedtime. 30 tablet  0  . rosuvastatin (CRESTOR) 10 MG tablet 1 tablet (10 mg total) by Per NG tube route daily.    . Water For Irrigation, Sterile (FREE WATER) SOLN Place 200 mLs into feeding tube every 6 (six) hours.     No current facility-administered medications for this visit.    REVIEW OF SYSTEMS:   Unable to obtain from patient.  PHYSICAL EXAMINATION: ECOG PERFORMANCE STATUS: 4 - Bedbound  Vitals:   04/15/20 1335  BP: 125/80  Pulse: 80  Resp: 18  Temp: 97.7 F (36.5 C)  SpO2: 90%   Filed Weights    GENERAL: chronically ill appearing elderly African American female in NAD  SKIN: skin color, texture, turgor are normal, no rashes or significant lesions EYES: conjunctiva are pink and non-injected, sclera clear  LUNGS: clear to auscultation and percussion with normal breathing effort HEART: regular rate & rhythm and no murmurs and no lower extremity edema Musculoskeletal: no cyanosis of digits and no clubbing  PSYCH: AOx0. Yes/no questions. NEURO: left sided hemiplasia  LABORATORY DATA:  I have reviewed the data as listed CBC Latest Ref Rng & Units 03/08/2020 03/02/2020 02/29/2020  WBC 4.0 - 10.5 K/uL 11.2(H) 10.8(H) 10.8(H)  Hemoglobin 12.0 - 15.0 g/dL 9.4(L) 9.0(L) 9.4(L)  Hematocrit 36.0 - 46.0 % 29.7(L) 29.2(L) 30.3(L)  Platelets 150 - 400 K/uL 507(H) 510(H) 568(H)    CMP Latest Ref Rng & Units 03/22/2020 03/21/2020 03/20/2020  Glucose 70 - 99 mg/dL 85 86 54(L)  BUN 8 - 23 mg/dL 27(H) 28(H) 24(H)  Creatinine 0.44 - 1.00 mg/dL 1.02(H) 0.85 0.87  Sodium 135 - 145 mmol/L 131(L) 131(L) 130(L)  Potassium 3.5 - 5.1 mmol/L 5.0 5.0 5.7(H)  Chloride 98 - 111 mmol/L 93(L) 94(L) 95(L)  CO2 22 - 32 mmol/L 27 28 27   Calcium 8.9 - 10.3 mg/dL 9.2 8.9 8.9  Total Protein 6.5 - 8.1 g/dL - - -  Total Bilirubin 0.3 - 1.2 mg/dL - - -  Alkaline Phos 38 - 126 U/L - - -  AST 15 - 41 U/L - - -  ALT 0 - 44 U/L - - -     PATHOLOGY: CYTOLOGY - NON PAP  CASE: [id number]  PATIENT: Amber Lopez   Non-Gynecological Cytology Report   Clinical History: Pleural  effusion  Specimen Submitted: A. PLEURAL FLUID,LEFT, THORACENTESIS:    FINAL MICROSCOPIC DIAGNOSIS:  - Malignant cells consistent with metastatic adenocarcinoma   SPECIMEN ADEQUACY:  Satisfactory for evaluation   GROSS:  Received is/are:10cc's of red fluid. (NM:nm)  Smears: 0  Concentration Method (Thin Prep): 1  Cell Block: 1 Conventional  Additional Studies: N/A   Final Diagnosis performed by Jaquita Folds, MD.  Electronically  signed 02/02/2020   RADIOGRAPHIC STUDIES: I have personally reviewed the radiological images as listed and agreed with the findings in the report. DG Chest Port 1 View  Result Date: 03/22/2020 CLINICAL DATA:  Dyspnea, code stroke EXAM: PORTABLE CHEST 1 VIEW COMPARISON:  Portable exam 0753 hours compared to 03/03/2020 FINDINGS: Normal heart size and mediastinal contours. Abnormal soft tissue at medial LEFT apex, corresponding to a mass on prior CT. Small LEFT pleural effusion and LEFT basilar atelectasis, decreased from prior exam. Remaining lungs clear. Surgical clips RIGHT axilla. Osseous demineralization with degenerative changes of LEFT AC joint and chronic LEFT rotator cuff tear. IMPRESSION: Small LEFT pleural effusion and LEFT basilar atelectasis, slightly improved. Persistent LEFT apex mass. Electronically Signed   By: Lavonia Dana M.D.   On: 03/22/2020 08:30    ASSESSMENT & PLAN Amber Lopez 79 y.o. female with medical history significant for HIV (on ART therapy), DM type II, HLD, and HTN who presents for evaluation of stage IV adenocarcinoma of the lung.  After review the labs, the records, schedule the patient the findings are most consistent with stage IV adenocarcinoma of the lung.  Given the patient's marked deconditioning from her stroke, poor mental status, and inability to communicate effectively I do not believe that she is a candidate for further treatment at this time.  I  do not recommend any chemotherapy or radiation.  I was clear with the patient's daughter that I believe that these treatments would increase toxicity without providing much benefit to the patient.  She voiced her understanding of this plan moving forward.  I would recommend that she continue to follow with neurology and in the event that she were to decondition we could consider transition to hospice.    The patient's daughter has requested a repeat CT scan in order to determine how the disease has spread and ground.  I have expressed to her that that would not change our management, however she noted that she would like that for prognostic purposes.  I do believe this would be reasonable and therefore we will order a CT scan of the chest abdomen pelvis in order to help determine the extent of the disease and how it has progressed in the interim since her last scan in October.  # Stage IV Adenocarcinoma of the Lung --discussed in detail with the patient's daughter the nature of her disease and treatment options moving forward --the patient's daughter wants to avoid unnecessary suffering and pain. I am in strong agreement with that plan --given her deconditioning and overally poor prognosis I do not recommend chemotherapy/radiation as the benefit would be limited and may be more harmful than good for the patient.  --recommend a comfort based approach with consideration of hospice/palliative care when her condition begins to decline.  --His daughter has requested a repeat CT scan of the chest to determine how the lung cancer has progressed.  I noted that this would not change management, however if the patient wanted this for prognostic purposes.  I do believe this would be reasonable and therefore we will order a  CT scan of the chest today. --RTC in 6 weeks for repeat evaluation.   No orders of the defined types were placed in this encounter.   All questions were answered. The patient knows to call the  clinic with any problems, questions or concerns.  A total of more than 60 minutes were spent on this encounter and over half of that time was spent on counseling and coordination of care as outlined above.   Ledell Peoples, MD Department of Hematology/Oncology [place] [facility] Phone: [phone] Pager: [phone] Email: [email or web address]  04/15/2020 1:49 PM

## 2020-04-16 ENCOUNTER — Encounter: Payer: Self-pay | Admitting: Hematology and Oncology

## 2020-04-19 DIAGNOSIS — R1310 Dysphagia, unspecified: Secondary | ICD-10-CM | POA: Diagnosis not present

## 2020-04-19 DIAGNOSIS — I69320 Aphasia following cerebral infarction: Secondary | ICD-10-CM | POA: Diagnosis not present

## 2020-04-19 DIAGNOSIS — C50919 Malignant neoplasm of unspecified site of unspecified female breast: Secondary | ICD-10-CM | POA: Diagnosis not present

## 2020-04-19 DIAGNOSIS — R531 Weakness: Secondary | ICD-10-CM | POA: Diagnosis not present

## 2020-04-20 ENCOUNTER — Telehealth: Payer: Self-pay | Admitting: Hematology and Oncology

## 2020-04-20 NOTE — Telephone Encounter (Signed)
Scheduled follow-up appointment per 12/23 los. Patient's daughter is aware.

## 2020-04-25 DIAGNOSIS — E1165 Type 2 diabetes mellitus with hyperglycemia: Secondary | ICD-10-CM | POA: Diagnosis not present

## 2020-04-29 DIAGNOSIS — E119 Type 2 diabetes mellitus without complications: Secondary | ICD-10-CM | POA: Diagnosis not present

## 2020-04-29 DIAGNOSIS — I639 Cerebral infarction, unspecified: Secondary | ICD-10-CM | POA: Diagnosis not present

## 2020-04-29 DIAGNOSIS — E785 Hyperlipidemia, unspecified: Secondary | ICD-10-CM | POA: Diagnosis not present

## 2020-04-29 DIAGNOSIS — C3490 Malignant neoplasm of unspecified part of unspecified bronchus or lung: Secondary | ICD-10-CM | POA: Diagnosis not present

## 2020-04-29 DIAGNOSIS — C50919 Malignant neoplasm of unspecified site of unspecified female breast: Secondary | ICD-10-CM | POA: Diagnosis not present

## 2020-04-29 DIAGNOSIS — I10 Essential (primary) hypertension: Secondary | ICD-10-CM | POA: Diagnosis not present

## 2020-04-29 DIAGNOSIS — J449 Chronic obstructive pulmonary disease, unspecified: Secondary | ICD-10-CM | POA: Diagnosis not present

## 2020-04-30 ENCOUNTER — Other Ambulatory Visit (HOSPITAL_COMMUNITY): Payer: Self-pay | Admitting: Family Medicine

## 2020-04-30 MED FILL — TIVICAY 50 MG TABLET: 50 | 30 days supply | Qty: 30 | Fill #0

## 2020-04-30 MED FILL — DESCOVY 200-25 MG TABS: 200-25 | 30 days supply | Qty: 30 | Fill #0

## 2020-05-05 DIAGNOSIS — R1111 Vomiting without nausea: Secondary | ICD-10-CM | POA: Diagnosis not present

## 2020-05-06 DIAGNOSIS — R1110 Vomiting, unspecified: Secondary | ICD-10-CM | POA: Diagnosis not present

## 2020-05-08 DIAGNOSIS — I639 Cerebral infarction, unspecified: Secondary | ICD-10-CM | POA: Diagnosis not present

## 2020-05-08 DIAGNOSIS — R1110 Vomiting, unspecified: Secondary | ICD-10-CM | POA: Diagnosis not present

## 2020-05-10 DIAGNOSIS — Z743 Need for continuous supervision: Secondary | ICD-10-CM | POA: Diagnosis not present

## 2020-05-10 DIAGNOSIS — R451 Restlessness and agitation: Secondary | ICD-10-CM | POA: Diagnosis not present

## 2020-05-10 DIAGNOSIS — K9423 Gastrostomy malfunction: Secondary | ICD-10-CM | POA: Diagnosis not present

## 2020-05-10 DIAGNOSIS — Z4682 Encounter for fitting and adjustment of non-vascular catheter: Secondary | ICD-10-CM | POA: Diagnosis not present

## 2020-05-10 DIAGNOSIS — Z8673 Personal history of transient ischemic attack (TIA), and cerebral infarction without residual deficits: Secondary | ICD-10-CM | POA: Diagnosis not present

## 2020-05-10 DIAGNOSIS — K9420 Gastrostomy complication, unspecified: Secondary | ICD-10-CM | POA: Diagnosis not present

## 2020-05-10 DIAGNOSIS — R404 Transient alteration of awareness: Secondary | ICD-10-CM | POA: Diagnosis not present

## 2020-05-10 DIAGNOSIS — R5381 Other malaise: Secondary | ICD-10-CM | POA: Diagnosis not present

## 2020-05-12 DIAGNOSIS — R4702 Dysphasia: Secondary | ICD-10-CM | POA: Diagnosis not present

## 2020-05-12 DIAGNOSIS — R1310 Dysphagia, unspecified: Secondary | ICD-10-CM | POA: Diagnosis not present

## 2020-05-12 DIAGNOSIS — I69354 Hemiplegia and hemiparesis following cerebral infarction affecting left non-dominant side: Secondary | ICD-10-CM | POA: Diagnosis not present

## 2020-05-12 DIAGNOSIS — I69391 Dysphagia following cerebral infarction: Secondary | ICD-10-CM | POA: Diagnosis not present

## 2020-05-12 DIAGNOSIS — Z931 Gastrostomy status: Secondary | ICD-10-CM | POA: Diagnosis not present

## 2020-05-12 DIAGNOSIS — R1312 Dysphagia, oropharyngeal phase: Secondary | ICD-10-CM | POA: Diagnosis not present

## 2020-05-13 ENCOUNTER — Telehealth: Payer: Self-pay | Admitting: Family Medicine

## 2020-05-13 DIAGNOSIS — I5030 Unspecified diastolic (congestive) heart failure: Secondary | ICD-10-CM | POA: Diagnosis not present

## 2020-05-13 DIAGNOSIS — I69354 Hemiplegia and hemiparesis following cerebral infarction affecting left non-dominant side: Secondary | ICD-10-CM | POA: Diagnosis not present

## 2020-05-13 DIAGNOSIS — R4702 Dysphasia: Secondary | ICD-10-CM | POA: Diagnosis not present

## 2020-05-13 DIAGNOSIS — E1151 Type 2 diabetes mellitus with diabetic peripheral angiopathy without gangrene: Secondary | ICD-10-CM | POA: Diagnosis not present

## 2020-05-13 DIAGNOSIS — I69320 Aphasia following cerebral infarction: Secondary | ICD-10-CM | POA: Diagnosis not present

## 2020-05-13 DIAGNOSIS — R1312 Dysphagia, oropharyngeal phase: Secondary | ICD-10-CM | POA: Diagnosis not present

## 2020-05-13 DIAGNOSIS — J449 Chronic obstructive pulmonary disease, unspecified: Secondary | ICD-10-CM | POA: Diagnosis not present

## 2020-05-13 DIAGNOSIS — I110 Hypertensive heart disease with heart failure: Secondary | ICD-10-CM | POA: Diagnosis not present

## 2020-05-13 DIAGNOSIS — I69391 Dysphagia following cerebral infarction: Secondary | ICD-10-CM | POA: Diagnosis not present

## 2020-05-13 NOTE — Telephone Encounter (Signed)
[place] for the [employer] order

## 2020-05-13 NOTE — Telephone Encounter (Signed)
Amber Lopez w/[facility] did their first visit today and the pt Temp was 101.5 HR 106 and the daughter stated that the pt had a COVID test a couple days ago and it was negative.   [facility] would like to get a Rx for dexcom and nursing will be going out in 3 weeks 3 to provide education to the daughter on the care of the pt.

## 2020-05-14 ENCOUNTER — Telehealth: Payer: Self-pay | Admitting: Family Medicine

## 2020-05-14 DIAGNOSIS — I69354 Hemiplegia and hemiparesis following cerebral infarction affecting left non-dominant side: Secondary | ICD-10-CM | POA: Diagnosis not present

## 2020-05-14 DIAGNOSIS — I110 Hypertensive heart disease with heart failure: Secondary | ICD-10-CM | POA: Diagnosis not present

## 2020-05-14 DIAGNOSIS — J449 Chronic obstructive pulmonary disease, unspecified: Secondary | ICD-10-CM | POA: Diagnosis not present

## 2020-05-14 DIAGNOSIS — E1151 Type 2 diabetes mellitus with diabetic peripheral angiopathy without gangrene: Secondary | ICD-10-CM | POA: Diagnosis not present

## 2020-05-14 DIAGNOSIS — R1312 Dysphagia, oropharyngeal phase: Secondary | ICD-10-CM | POA: Diagnosis not present

## 2020-05-14 DIAGNOSIS — I69320 Aphasia following cerebral infarction: Secondary | ICD-10-CM | POA: Diagnosis not present

## 2020-05-14 DIAGNOSIS — R4702 Dysphasia: Secondary | ICD-10-CM | POA: Diagnosis not present

## 2020-05-14 DIAGNOSIS — I69391 Dysphagia following cerebral infarction: Secondary | ICD-10-CM | POA: Diagnosis not present

## 2020-05-14 DIAGNOSIS — I5030 Unspecified diastolic (congestive) heart failure: Secondary | ICD-10-CM | POA: Diagnosis not present

## 2020-05-14 NOTE — Telephone Encounter (Signed)
Patient daughter is calling and wanted to see if the provider can put an order in for patient to be on hospice, please advise. CB is [phone]

## 2020-05-15 DIAGNOSIS — R4702 Dysphasia: Secondary | ICD-10-CM | POA: Diagnosis not present

## 2020-05-16 DIAGNOSIS — R4702 Dysphasia: Secondary | ICD-10-CM | POA: Diagnosis not present

## 2020-05-17 ENCOUNTER — Telehealth: Payer: Self-pay | Admitting: Family Medicine

## 2020-05-17 DIAGNOSIS — I69354 Hemiplegia and hemiparesis following cerebral infarction affecting left non-dominant side: Secondary | ICD-10-CM | POA: Diagnosis not present

## 2020-05-17 DIAGNOSIS — I69320 Aphasia following cerebral infarction: Secondary | ICD-10-CM | POA: Diagnosis not present

## 2020-05-17 DIAGNOSIS — R1312 Dysphagia, oropharyngeal phase: Secondary | ICD-10-CM | POA: Diagnosis not present

## 2020-05-17 DIAGNOSIS — I110 Hypertensive heart disease with heart failure: Secondary | ICD-10-CM | POA: Diagnosis not present

## 2020-05-17 DIAGNOSIS — E1151 Type 2 diabetes mellitus with diabetic peripheral angiopathy without gangrene: Secondary | ICD-10-CM | POA: Diagnosis not present

## 2020-05-17 DIAGNOSIS — R4702 Dysphasia: Secondary | ICD-10-CM | POA: Diagnosis not present

## 2020-05-17 DIAGNOSIS — I5030 Unspecified diastolic (congestive) heart failure: Secondary | ICD-10-CM | POA: Diagnosis not present

## 2020-05-17 DIAGNOSIS — J449 Chronic obstructive pulmonary disease, unspecified: Secondary | ICD-10-CM | POA: Diagnosis not present

## 2020-05-17 DIAGNOSIS — I69391 Dysphagia following cerebral infarction: Secondary | ICD-10-CM | POA: Diagnosis not present

## 2020-05-17 NOTE — Telephone Encounter (Signed)
Amber Lopez is calling in to check the status of the below msg and pt would like to also have a glucose meter prescribed.  Amber Lopez would like to have a call back.

## 2020-05-17 NOTE — Telephone Encounter (Signed)
No longer need it

## 2020-05-17 NOTE — Telephone Encounter (Signed)
Please advise if ok to place order

## 2020-05-18 ENCOUNTER — Ambulatory Visit (HOSPITAL_COMMUNITY): Payer: Medicare Other

## 2020-05-18 ENCOUNTER — Inpatient Hospital Stay: Payer: Medicare Other | Attending: Hematology and Oncology

## 2020-05-18 DIAGNOSIS — R4702 Dysphasia: Secondary | ICD-10-CM | POA: Diagnosis not present

## 2020-05-19 ENCOUNTER — Other Ambulatory Visit: Payer: Self-pay | Admitting: Family Medicine

## 2020-05-19 DIAGNOSIS — I5030 Unspecified diastolic (congestive) heart failure: Secondary | ICD-10-CM | POA: Diagnosis not present

## 2020-05-19 DIAGNOSIS — R000 Tachycardia, unspecified: Secondary | ICD-10-CM | POA: Diagnosis not present

## 2020-05-19 DIAGNOSIS — E1169 Type 2 diabetes mellitus with other specified complication: Secondary | ICD-10-CM

## 2020-05-19 DIAGNOSIS — I69354 Hemiplegia and hemiparesis following cerebral infarction affecting left non-dominant side: Secondary | ICD-10-CM | POA: Diagnosis not present

## 2020-05-19 DIAGNOSIS — E1151 Type 2 diabetes mellitus with diabetic peripheral angiopathy without gangrene: Secondary | ICD-10-CM | POA: Diagnosis not present

## 2020-05-19 DIAGNOSIS — R4702 Dysphasia: Secondary | ICD-10-CM | POA: Diagnosis not present

## 2020-05-19 DIAGNOSIS — R1312 Dysphagia, oropharyngeal phase: Secondary | ICD-10-CM | POA: Diagnosis not present

## 2020-05-19 DIAGNOSIS — I69320 Aphasia following cerebral infarction: Secondary | ICD-10-CM | POA: Diagnosis not present

## 2020-05-19 DIAGNOSIS — I69391 Dysphagia following cerebral infarction: Secondary | ICD-10-CM | POA: Diagnosis not present

## 2020-05-19 DIAGNOSIS — R404 Transient alteration of awareness: Secondary | ICD-10-CM | POA: Diagnosis not present

## 2020-05-19 DIAGNOSIS — Z794 Long term (current) use of insulin: Secondary | ICD-10-CM

## 2020-05-19 DIAGNOSIS — I110 Hypertensive heart disease with heart failure: Secondary | ICD-10-CM | POA: Diagnosis not present

## 2020-05-19 DIAGNOSIS — C3492 Malignant neoplasm of unspecified part of left bronchus or lung: Secondary | ICD-10-CM

## 2020-05-19 DIAGNOSIS — R0902 Hypoxemia: Secondary | ICD-10-CM | POA: Diagnosis not present

## 2020-05-19 DIAGNOSIS — R0689 Other abnormalities of breathing: Secondary | ICD-10-CM | POA: Diagnosis not present

## 2020-05-19 DIAGNOSIS — J449 Chronic obstructive pulmonary disease, unspecified: Secondary | ICD-10-CM | POA: Diagnosis not present

## 2020-05-19 DIAGNOSIS — E1165 Type 2 diabetes mellitus with hyperglycemia: Secondary | ICD-10-CM | POA: Diagnosis not present

## 2020-05-19 MED ORDER — DEXCOM G5 MOB/G4 PLAT SENSOR MISC: 6 refills | Status: DC

## 2020-05-19 NOTE — Telephone Encounter (Signed)
[place] for VO.  Meter sent to pharmacy.  Referral to hospice placed.

## 2020-05-19 NOTE — Telephone Encounter (Signed)
[facility] is calling back to check on the status of the below msg.  And new concern pt is breathing fast and hard per the daughter at 1:00 a.m. this morning she called EMS and they stated that the pt is at the end of life phase and they are not able to do anything about her breathing.  Pt heart rate has always been fast since [facility] has been going out and today it is 114.  [facility] [place] is going to put in a palliative care consultation and pt has a stage 3 pressure ulcer on her bottom.  They also would like to check on the status of the Dexcom and glucose meter and would like to see if they can be called in to [facility] on [address] is needing verbal order to treat the wound with calicium aliginate.  May leave a detail msg on the secured voice mail.

## 2020-05-19 NOTE — Telephone Encounter (Signed)
Patient's daughter called wanting to know if the order has been place for her mom for Hospice because she is needing help with the care of the extremely bad bed sores that the patient came home from Rehab.  She had to call the paramedics last night because of her breathing.

## 2020-05-19 NOTE — Telephone Encounter (Signed)
Please advise if ok for verbal orders 

## 2020-05-20 ENCOUNTER — Telehealth: Payer: Self-pay | Admitting: Family Medicine

## 2020-05-20 ENCOUNTER — Other Ambulatory Visit: Payer: Self-pay

## 2020-05-20 DIAGNOSIS — I110 Hypertensive heart disease with heart failure: Secondary | ICD-10-CM | POA: Diagnosis not present

## 2020-05-20 DIAGNOSIS — R4702 Dysphasia: Secondary | ICD-10-CM | POA: Diagnosis not present

## 2020-05-20 DIAGNOSIS — J449 Chronic obstructive pulmonary disease, unspecified: Secondary | ICD-10-CM | POA: Diagnosis not present

## 2020-05-20 DIAGNOSIS — I69391 Dysphagia following cerebral infarction: Secondary | ICD-10-CM | POA: Diagnosis not present

## 2020-05-20 DIAGNOSIS — I69354 Hemiplegia and hemiparesis following cerebral infarction affecting left non-dominant side: Secondary | ICD-10-CM | POA: Diagnosis not present

## 2020-05-20 DIAGNOSIS — L89313 Pressure ulcer of right buttock, stage 3: Secondary | ICD-10-CM | POA: Diagnosis not present

## 2020-05-20 DIAGNOSIS — R1312 Dysphagia, oropharyngeal phase: Secondary | ICD-10-CM | POA: Diagnosis not present

## 2020-05-20 DIAGNOSIS — I5030 Unspecified diastolic (congestive) heart failure: Secondary | ICD-10-CM | POA: Diagnosis not present

## 2020-05-20 DIAGNOSIS — E1151 Type 2 diabetes mellitus with diabetic peripheral angiopathy without gangrene: Secondary | ICD-10-CM | POA: Diagnosis not present

## 2020-05-20 DIAGNOSIS — I69320 Aphasia following cerebral infarction: Secondary | ICD-10-CM | POA: Diagnosis not present

## 2020-05-20 DIAGNOSIS — E1169 Type 2 diabetes mellitus with other specified complication: Secondary | ICD-10-CM

## 2020-05-20 MED ORDER — DEXCOM G5 MOB/G4 PLAT SENSOR MISC: 6 refills | Status: AC

## 2020-05-20 NOTE — Telephone Encounter (Signed)
Ok for verbal order  °

## 2020-05-20 NOTE — Telephone Encounter (Signed)
Spoke with pt pharmacy regarding Dexcom orders state that Insurance will not cover at a local pharmacy, advised to have pt call her insurance for advise on what pharmacy to send the Rx. Spoke with pt daughter advised to call pt insurance and then call the office so we can resend the Rx to the pharmacy that covers the Dexcom, verbalized understanding

## 2020-05-20 NOTE — Telephone Encounter (Signed)
[facility] need verbal orders for OT for two weeks   and one for two weeks. please call Will if you have any questions [phone];  may lvg a message if he does not answer

## 2020-05-20 NOTE — Telephone Encounter (Signed)
Spoke with [facility] gave VO for wound care as approved by Dr Volanda Napoleon. Also aware that Dr Volanda Napoleon placed referral for hospice for pt. Spoke with pt daughter aware that order for Dexcom blood sugar meter was sent to pt pharmacy and that order for wound care and Hospice have been approved and placed.Verbalized understanding

## 2020-05-21 DIAGNOSIS — E1151 Type 2 diabetes mellitus with diabetic peripheral angiopathy without gangrene: Secondary | ICD-10-CM | POA: Diagnosis not present

## 2020-05-21 DIAGNOSIS — I69391 Dysphagia following cerebral infarction: Secondary | ICD-10-CM | POA: Diagnosis not present

## 2020-05-21 DIAGNOSIS — I69354 Hemiplegia and hemiparesis following cerebral infarction affecting left non-dominant side: Secondary | ICD-10-CM | POA: Diagnosis not present

## 2020-05-21 DIAGNOSIS — I110 Hypertensive heart disease with heart failure: Secondary | ICD-10-CM | POA: Diagnosis not present

## 2020-05-21 DIAGNOSIS — I69320 Aphasia following cerebral infarction: Secondary | ICD-10-CM | POA: Diagnosis not present

## 2020-05-21 DIAGNOSIS — J449 Chronic obstructive pulmonary disease, unspecified: Secondary | ICD-10-CM | POA: Diagnosis not present

## 2020-05-21 DIAGNOSIS — R4702 Dysphasia: Secondary | ICD-10-CM | POA: Diagnosis not present

## 2020-05-21 DIAGNOSIS — R1312 Dysphagia, oropharyngeal phase: Secondary | ICD-10-CM | POA: Diagnosis not present

## 2020-05-21 DIAGNOSIS — I5030 Unspecified diastolic (congestive) heart failure: Secondary | ICD-10-CM | POA: Diagnosis not present

## 2020-05-21 NOTE — Telephone Encounter (Signed)
Rx refax to [employer] [facility] pt pharmacy, awaiting approval from pt pharmacy

## 2020-05-21 NOTE — Telephone Encounter (Signed)
Rx was refax to correct pharmacy [facility] per pt daughter, Pharmacy state that the G 5 Dexcom is no longer and stock but pt insurance is working to approve the [employer], state that they will notify the office if Rx is denied

## 2020-05-21 NOTE — Telephone Encounter (Signed)
[place] for VO.

## 2020-05-21 NOTE — Telephone Encounter (Signed)
Spoke with will advised that Dr Volanda Napoleon approved requested orders for OT, verbalized understanding

## 2020-05-22 DIAGNOSIS — R4702 Dysphasia: Secondary | ICD-10-CM | POA: Diagnosis not present

## 2020-05-23 DIAGNOSIS — R4702 Dysphasia: Secondary | ICD-10-CM | POA: Diagnosis not present

## 2020-05-24 ENCOUNTER — Telehealth: Payer: Self-pay | Admitting: Family Medicine

## 2020-05-24 DIAGNOSIS — I69354 Hemiplegia and hemiparesis following cerebral infarction affecting left non-dominant side: Secondary | ICD-10-CM | POA: Diagnosis not present

## 2020-05-24 DIAGNOSIS — J449 Chronic obstructive pulmonary disease, unspecified: Secondary | ICD-10-CM | POA: Diagnosis not present

## 2020-05-24 DIAGNOSIS — E1151 Type 2 diabetes mellitus with diabetic peripheral angiopathy without gangrene: Secondary | ICD-10-CM | POA: Diagnosis not present

## 2020-05-24 DIAGNOSIS — I69391 Dysphagia following cerebral infarction: Secondary | ICD-10-CM | POA: Diagnosis not present

## 2020-05-24 DIAGNOSIS — R4702 Dysphasia: Secondary | ICD-10-CM | POA: Diagnosis not present

## 2020-05-24 DIAGNOSIS — I69320 Aphasia following cerebral infarction: Secondary | ICD-10-CM | POA: Diagnosis not present

## 2020-05-24 DIAGNOSIS — R1312 Dysphagia, oropharyngeal phase: Secondary | ICD-10-CM | POA: Diagnosis not present

## 2020-05-24 DIAGNOSIS — I5030 Unspecified diastolic (congestive) heart failure: Secondary | ICD-10-CM | POA: Diagnosis not present

## 2020-05-24 DIAGNOSIS — I110 Hypertensive heart disease with heart failure: Secondary | ICD-10-CM | POA: Diagnosis not present

## 2020-05-24 NOTE — Telephone Encounter (Signed)
Dexcom Rx was sent to pt pharmacy but waiting approval from Medicare. Pt is needing refills on rest of the medications listed. Please advise

## 2020-05-24 NOTE — Telephone Encounter (Signed)
dicyclomine (BENTYL) 10 MG/5ML solution  Pt need a refill atovaquone (MEPRON) 750 MG/5ML suspension insulin detemir (LEVEMIR) 100 UNIT/ML injection Continuous Blood Gluc Sensor (DEXCOM G5 MOB/G4 PLAT SENSOR) MISC need needles also  [id number] [address], [address] AT [place]  Phone:  [phone] Fax:  [phone] Please call [phone]

## 2020-05-25 ENCOUNTER — Telehealth: Payer: Self-pay

## 2020-05-25 DIAGNOSIS — R1312 Dysphagia, oropharyngeal phase: Secondary | ICD-10-CM | POA: Diagnosis not present

## 2020-05-25 DIAGNOSIS — E1151 Type 2 diabetes mellitus with diabetic peripheral angiopathy without gangrene: Secondary | ICD-10-CM | POA: Diagnosis not present

## 2020-05-25 DIAGNOSIS — I69391 Dysphagia following cerebral infarction: Secondary | ICD-10-CM | POA: Diagnosis not present

## 2020-05-25 DIAGNOSIS — J449 Chronic obstructive pulmonary disease, unspecified: Secondary | ICD-10-CM | POA: Diagnosis not present

## 2020-05-25 DIAGNOSIS — I110 Hypertensive heart disease with heart failure: Secondary | ICD-10-CM | POA: Diagnosis not present

## 2020-05-25 DIAGNOSIS — R4702 Dysphasia: Secondary | ICD-10-CM | POA: Diagnosis not present

## 2020-05-25 DIAGNOSIS — I69320 Aphasia following cerebral infarction: Secondary | ICD-10-CM | POA: Diagnosis not present

## 2020-05-25 DIAGNOSIS — I69354 Hemiplegia and hemiparesis following cerebral infarction affecting left non-dominant side: Secondary | ICD-10-CM | POA: Diagnosis not present

## 2020-05-25 DIAGNOSIS — I5030 Unspecified diastolic (congestive) heart failure: Secondary | ICD-10-CM | POA: Diagnosis not present

## 2020-05-25 MED FILL — DESCOVY 200-25 MG TABS: 200-25 | 30 days supply | Qty: 30 | Fill #1

## 2020-05-25 NOTE — Telephone Encounter (Signed)
Hospice called this morning state that pt was admitted to Hospice today, Hospice nurse state that they request VO for a DNR and Continuous Oxygen, state that pt family requests and reports of pt oxygen levels being low. Hospice also requests for pt medication Bentyl be changed to the pill form since Hospice does not cover the liquid form, request to be sent to pt pharmacy when approved. Hospice aware that this will be done tomorrow

## 2020-05-25 NOTE — Telephone Encounter (Signed)
[place] for VO.

## 2020-05-26 ENCOUNTER — Inpatient Hospital Stay: Payer: Medicare Other

## 2020-05-26 ENCOUNTER — Telehealth: Payer: Self-pay | Admitting: *Deleted

## 2020-05-26 ENCOUNTER — Inpatient Hospital Stay: Payer: Medicare Other | Admitting: Hematology and Oncology

## 2020-05-26 DIAGNOSIS — R4702 Dysphasia: Secondary | ICD-10-CM | POA: Diagnosis not present

## 2020-05-26 DIAGNOSIS — L89313 Pressure ulcer of right buttock, stage 3: Secondary | ICD-10-CM | POA: Diagnosis not present

## 2020-05-26 NOTE — Telephone Encounter (Signed)
TCT patient's daughter, [name]. Asked [name] if her mother would be coming to her appt with Dr. Lorenso Courier today.  [name] states that her mother is with Hospice now and will  ot be coming to this appt.   Advised that I would let Dr. Lorenso Courier know this.

## 2020-05-26 NOTE — Telephone Encounter (Signed)
Message sent to Dr Banks for advise 

## 2020-05-26 NOTE — Telephone Encounter (Signed)
Spoke with Sam with [facility] regarding pt approved verbal orders for a DNR, verbalized understanding

## 2020-05-27 DIAGNOSIS — R4702 Dysphasia: Secondary | ICD-10-CM | POA: Diagnosis not present

## 2020-05-28 ENCOUNTER — Telehealth: Payer: Self-pay

## 2020-05-28 DIAGNOSIS — R4702 Dysphasia: Secondary | ICD-10-CM | POA: Diagnosis not present

## 2020-05-28 NOTE — Telephone Encounter (Signed)
error 

## 2020-05-28 NOTE — Telephone Encounter (Signed)
Okay for refills.

## 2020-05-29 DIAGNOSIS — R404 Transient alteration of awareness: Secondary | ICD-10-CM | POA: Diagnosis not present

## 2020-05-29 DIAGNOSIS — R4020 Unspecified coma: Secondary | ICD-10-CM | POA: Diagnosis not present

## 2020-05-29 DIAGNOSIS — R0689 Other abnormalities of breathing: Secondary | ICD-10-CM | POA: Diagnosis not present

## 2020-05-29 DIAGNOSIS — R4702 Dysphasia: Secondary | ICD-10-CM | POA: Diagnosis not present

## 2020-05-29 DIAGNOSIS — I499 Cardiac arrhythmia, unspecified: Secondary | ICD-10-CM | POA: Diagnosis not present

## 2020-05-30 DIAGNOSIS — R4702 Dysphasia: Secondary | ICD-10-CM | POA: Diagnosis not present

## 2020-05-31 ENCOUNTER — Telehealth: Payer: Self-pay

## 2020-05-31 DIAGNOSIS — R4702 Dysphasia: Secondary | ICD-10-CM | POA: Diagnosis not present

## 2020-05-31 NOTE — Telephone Encounter (Signed)
[facility] dropped off Comfort Care Orders Form for pt, Forms have been completed and Lennette Bihari was notified to pick up forms. State that he will pick up forms tomorrow morning.

## 2020-05-31 NOTE — Telephone Encounter (Signed)
Noted  

## 2020-05-31 NOTE — Telephone Encounter (Signed)
Rx not sent to pt pharmacy since Pt Daughter reported that Hospice stopped pt medications

## 2020-05-31 NOTE — Telephone Encounter (Signed)
This was done.

## 2020-06-01 DIAGNOSIS — R4702 Dysphasia: Secondary | ICD-10-CM | POA: Diagnosis not present

## 2020-06-02 DIAGNOSIS — R4702 Dysphasia: Secondary | ICD-10-CM | POA: Diagnosis not present

## 2020-06-03 DIAGNOSIS — R4702 Dysphasia: Secondary | ICD-10-CM | POA: Diagnosis not present

## 2020-06-04 DIAGNOSIS — R4702 Dysphasia: Secondary | ICD-10-CM | POA: Diagnosis not present

## 2020-06-09 ENCOUNTER — Other Ambulatory Visit: Payer: Self-pay

## 2020-06-09 NOTE — Patient Outreach (Signed)
[facility]) Care Management  06/09/2020  Amber Lopez 03-22-1941 [id number]   First telephone outreach attempt to obtain mRS. No answer. Left message for returned call.  [facility] Management Assistant [phone]

## 2020-06-17 ENCOUNTER — Other Ambulatory Visit: Payer: Self-pay

## 2020-06-17 NOTE — Patient Outreach (Signed)
[place] [facility]) Care Management  06/17/2020  Amber Lopez [birth year] [id number]   Telephone outreach to patient to obtain mRS was successfully completed. MRS= 6  Spoke with daughter and informed that patient is deceased (Jun 26, 2020)  Thank you, [place] Care Management Assistant

## 2020-06-22 DIAGNOSIS — 419620001 Death: Secondary | SNOMED CT | POA: Diagnosis not present

## 2020-06-22 DEATH — deceased

## 2020-07-20 ENCOUNTER — Other Ambulatory Visit (HOSPITAL_COMMUNITY): Payer: Self-pay

## 2020-07-29 ENCOUNTER — Other Ambulatory Visit (HOSPITAL_COMMUNITY): Payer: Self-pay

## 2020-08-18 ENCOUNTER — Telehealth: Payer: Self-pay | Admitting: Family Medicine

## 2020-08-18 NOTE — Telephone Encounter (Signed)
Amber Lopez from [place] called to see if we receive the wound care supply order that was faxed to our office on 04/20  [facility] [phone] extension [phone]

## 2020-08-19 ENCOUNTER — Other Ambulatory Visit (HOSPITAL_COMMUNITY): Payer: Self-pay

## 2020-08-20 NOTE — Telephone Encounter (Signed)
Paperwork was received and faxed back on 08/18/20.

## 2021-09-08 IMAGING — DX DG CHEST 1V PORT
1 series · 1 of 1 positions shown · non-contrast
Comparison: January 31, 2020.

CLINICAL DATA: Tachypnea.

EXAM:
PORTABLE CHEST 1 VIEW

[chest ap]
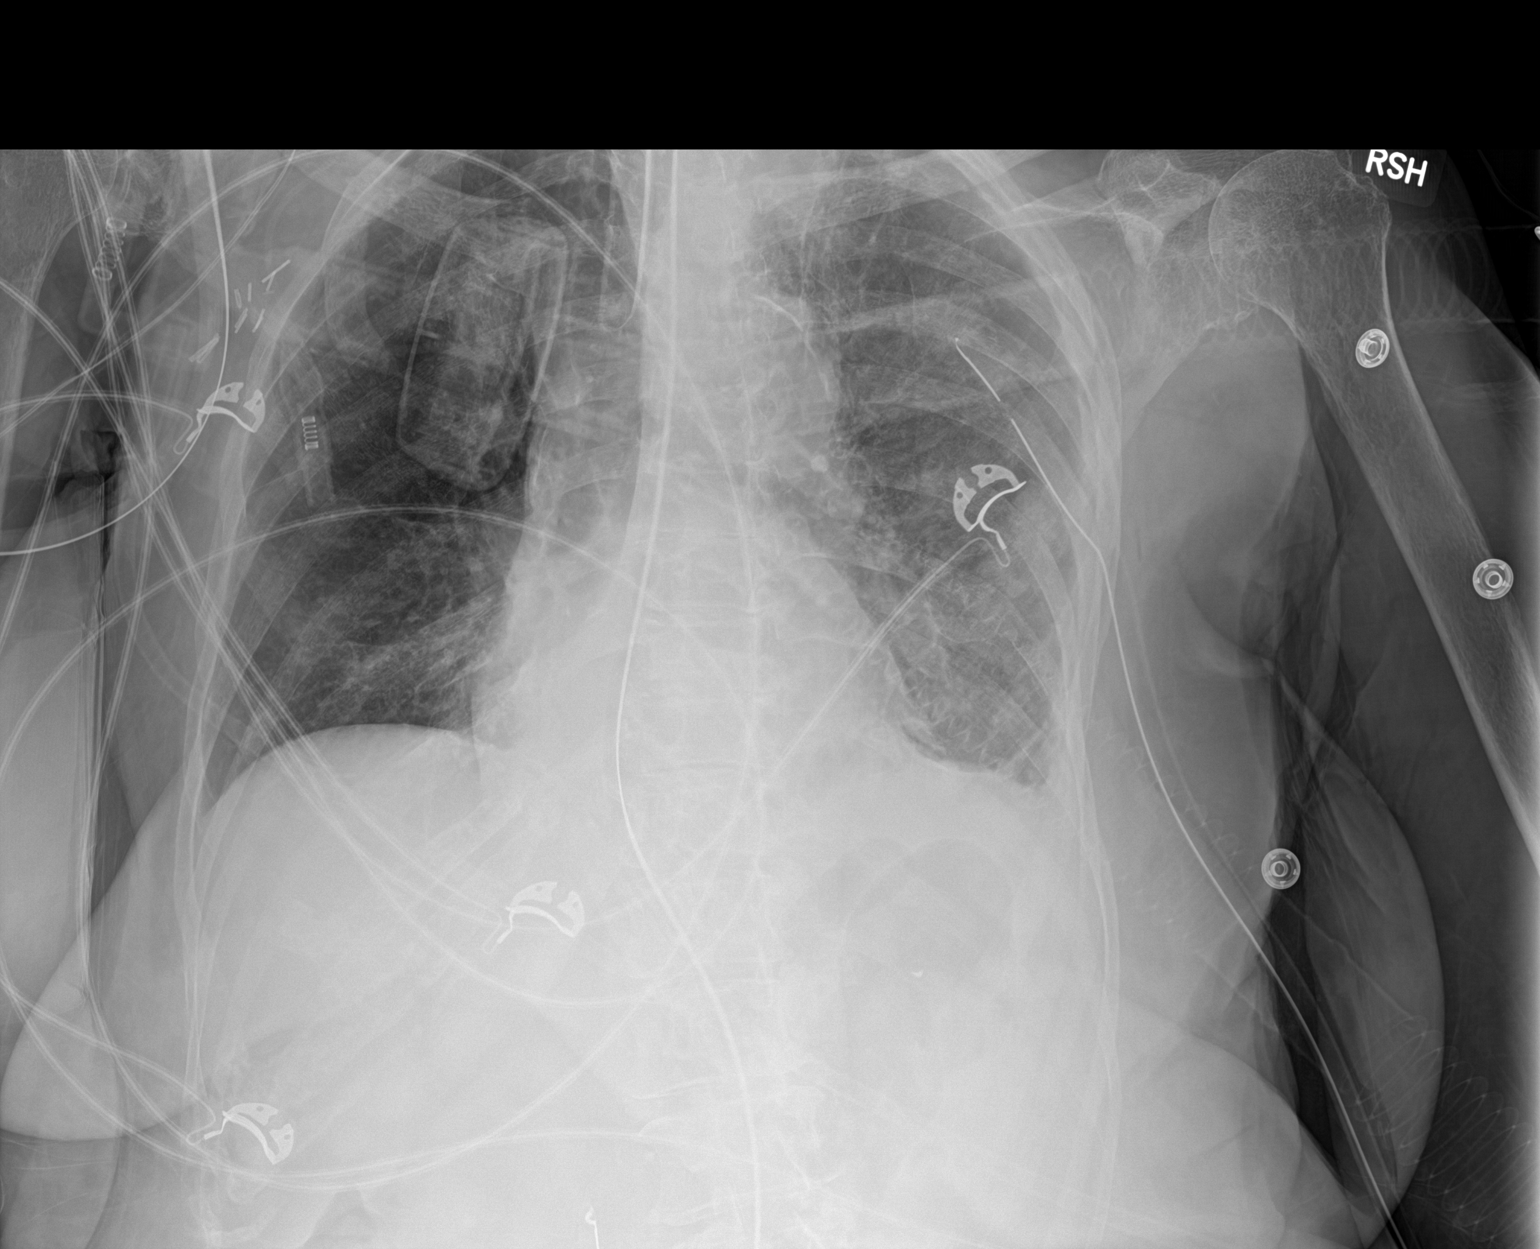

[1 of 1 positions shown; findings below may reference images not displayed]

FINDINGS: Stable cardiomediastinal silhouette. Endotracheal and nasogastric
tubes are unchanged in position. Left-sided chest tube is noted
without pneumothorax. Right lung is clear. Mild left basilar
subsegmental atelectasis is noted with probable small left pleural
effusion. Bony thorax is unremarkable.
IMPRESSION: Stable support apparatus. Mild left basilar subsegmental atelectasis
is noted with probable small left pleural effusion. No pneumothorax
is noted.

## 2021-09-08 IMAGING — CT CT HEAD W/O CM
3 series · 14 of 47 positions shown, 16 images · non-contrast
Comparison: CT head 01/31/2020

CLINICAL DATA: Stroke follow-up

EXAM:
CT HEAD WITHOUT CONTRAST
TECHNIQUE: Contiguous axial images were obtained from the base of the skull
through the vertex without intravenous contrast.

[Series 4: head 5.0 h30s · axial · 0.52mm/px · z∈[-47,+83]mm · 8 of 32 slices shown, 10 images]
[im 3/32  brain]
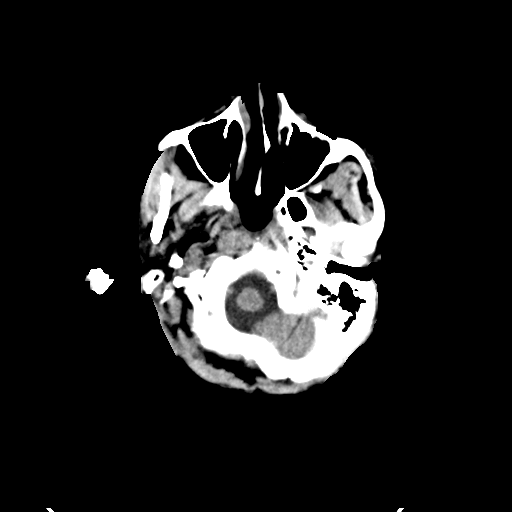
[im 3/32  bone]
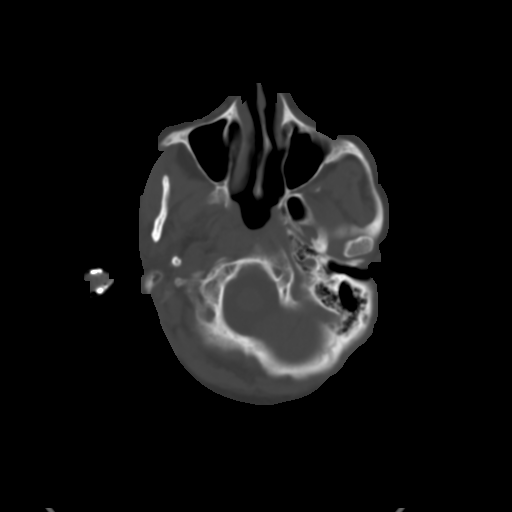
[im 7/32  brain]
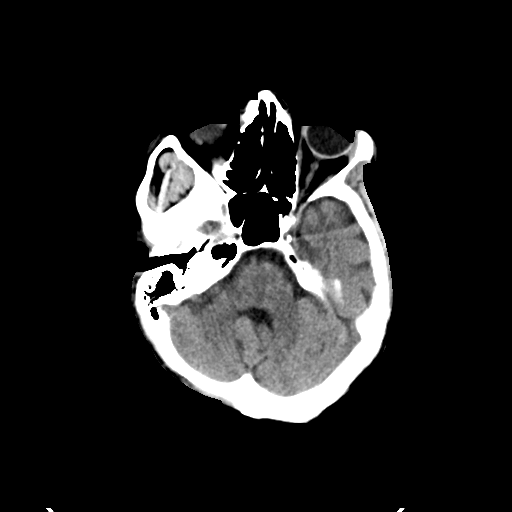
[im 10/32  brain]
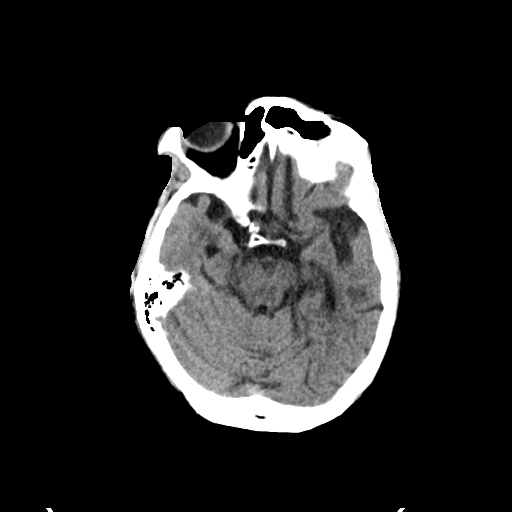
[im 14/32  brain]
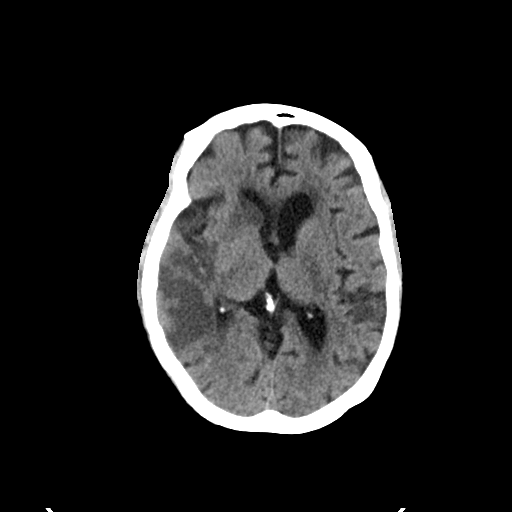
[im 18/32  brain]
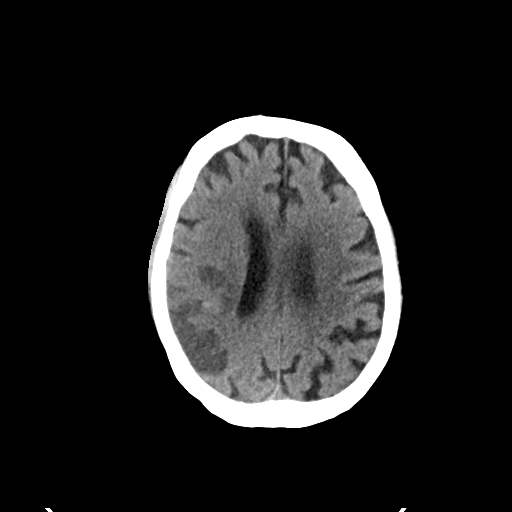
[im 18/32  bone]
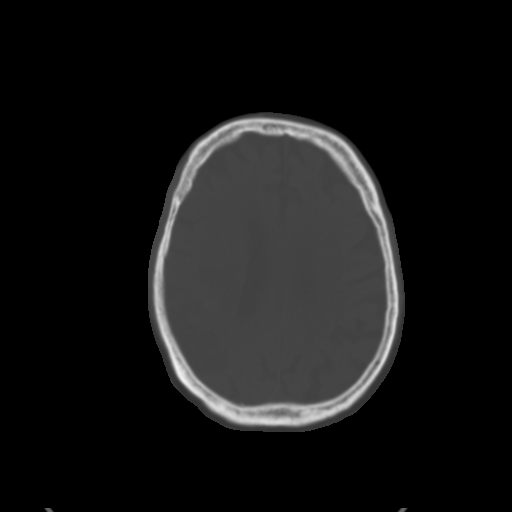
[im 22/32  brain]
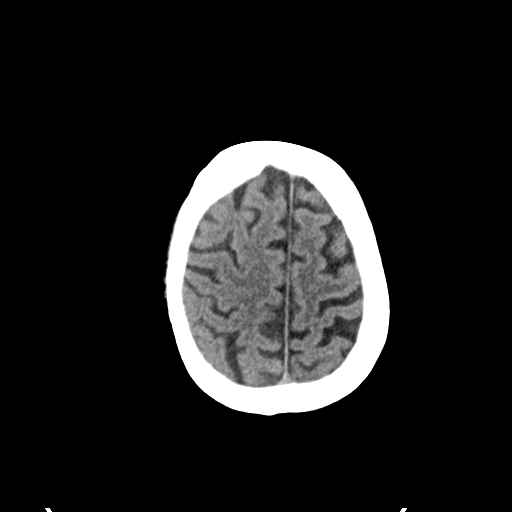
[im 25/32  brain]
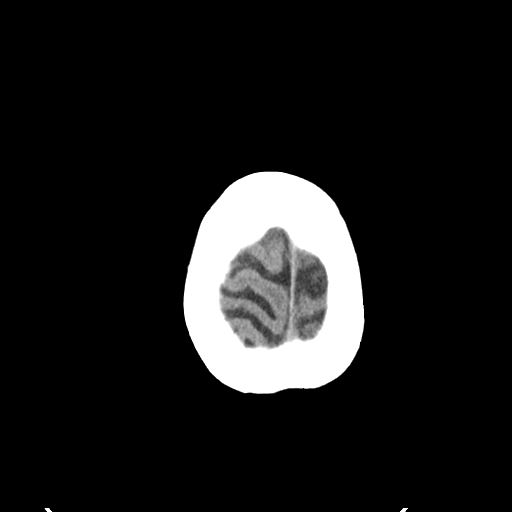
[im 29/32  brain]
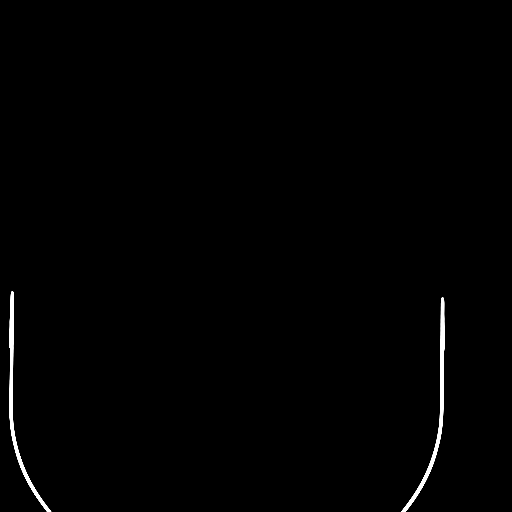

[Series 6: head 3.0 mpr cor · coronal · 0.31mm/px · 3 of 67 slices shown]
[im 23/67  brain]
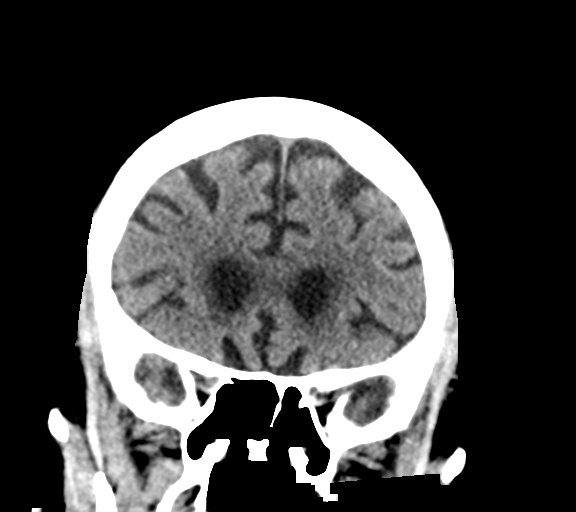
[im 30/67  brain]
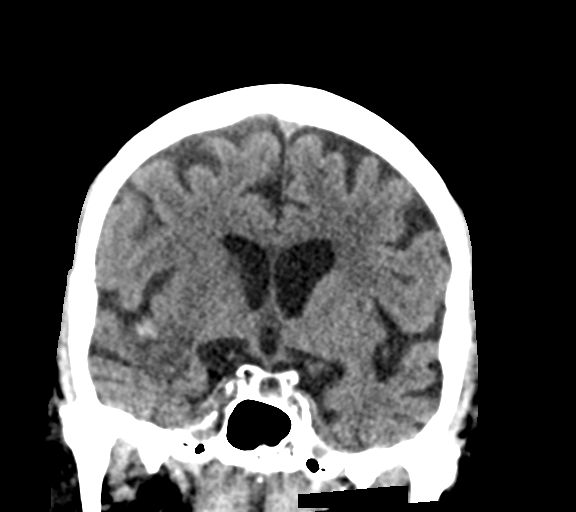
[im 37/67  brain]
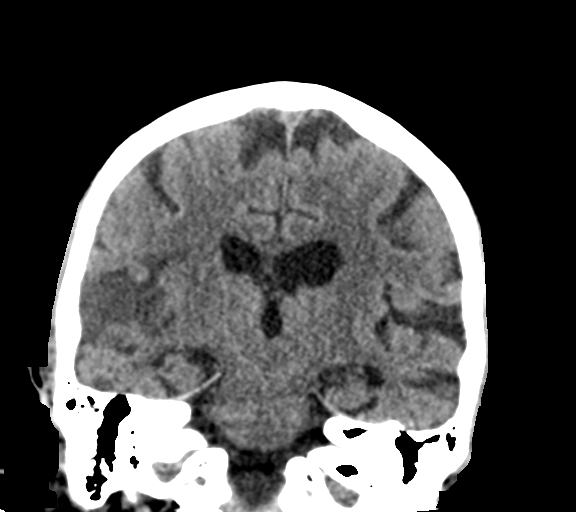

[Series 7: head 3.0 mpr sag · sagittal · 0.31mm/px · 3 of 66 slices shown]
[im 25/66  brain]
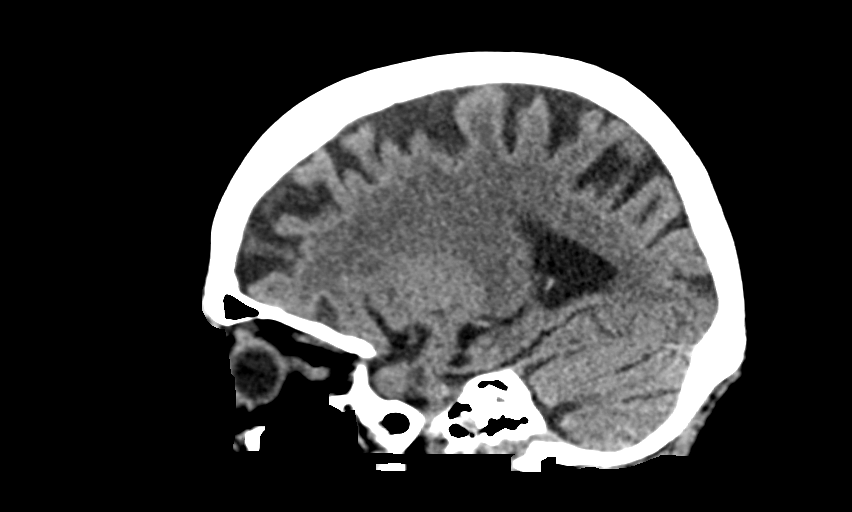
[im 34/66  brain]
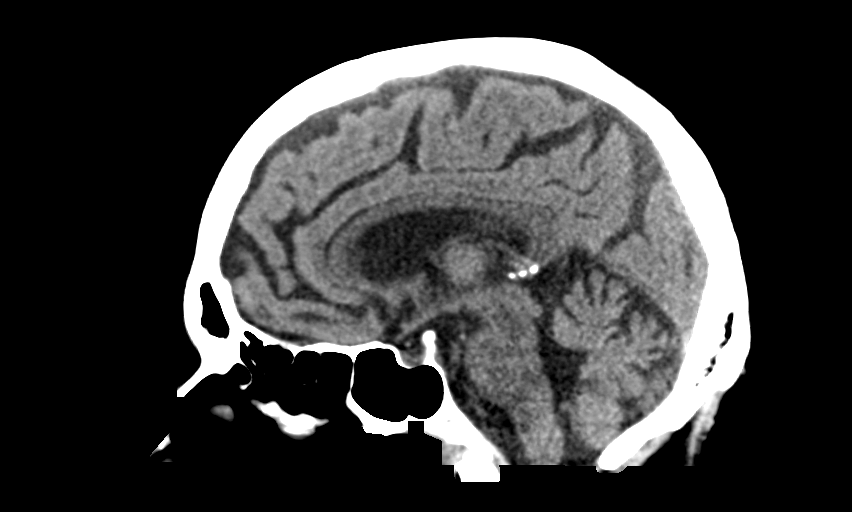
[im 42/66  brain]
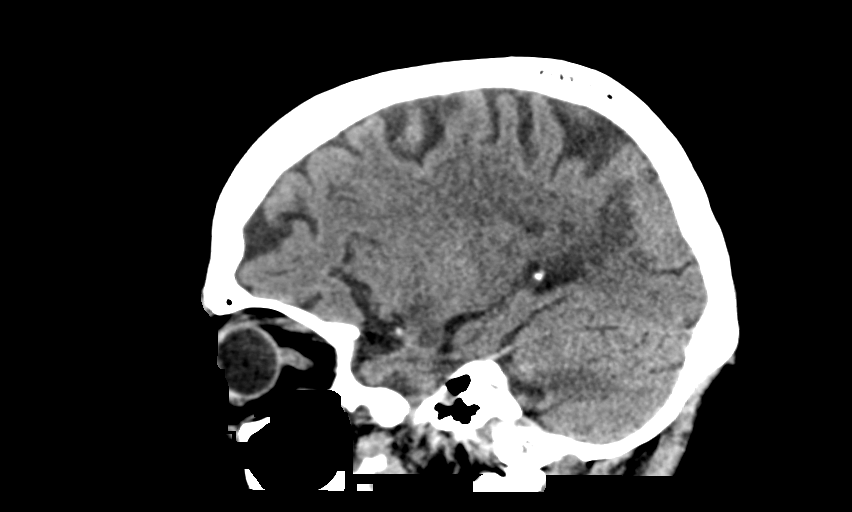

[14 of 47 positions shown; findings below may reference images not displayed]

FINDINGS: Brain: Evolving right MCA territory infarct involving the superior
temporal lobe, right parietal lobe, and basal ganglia with slightly
decreased attenuation. Slightly increased edema associated with the
caudate head and right frontal white matter. Similar local mass
effect with sulcal effacement. No significant midline shift. Basal
cisterns are patent. Similar volume/appearance of subarachnoid
hemorrhage near the right MCA bifurcation. No evidence of new
hemorrhage. Similar generalized atrophy with ex vacuo ventricular
dilation. No hydrocephalus. Similar additional patchy white matter
hypoattenuation, compatible with chronic microvascular ischemic
disease.

Vascular: Calcific atherosclerosis.

Skull: Normal. Negative for fracture or focal lesion.

Sinuses/Orbits: The paranasal sinuses are clear. No acute orbital
abnormality.

Other: No mastoid effusion.
IMPRESSION: 1. Expected evolution of a right MCA territory infarct with slightly
increased edema associated with the caudate head and right frontal
white matter. No progressive mass effect.
2. Similar focal subarachnoid hemorrhage near the right MCA
bifurcation. No evidence of new hemorrhage.
3. Similar chronic microvascular ischemic disease and atrophy.

## 2021-09-13 IMAGING — DX DG CHEST 1V PORT
1 series · 1 of 1 positions shown · non-contrast
Comparison: February 05, 2020

CLINICAL DATA: Hypoxia

EXAM:
PORTABLE CHEST 1 VIEW

[chest]
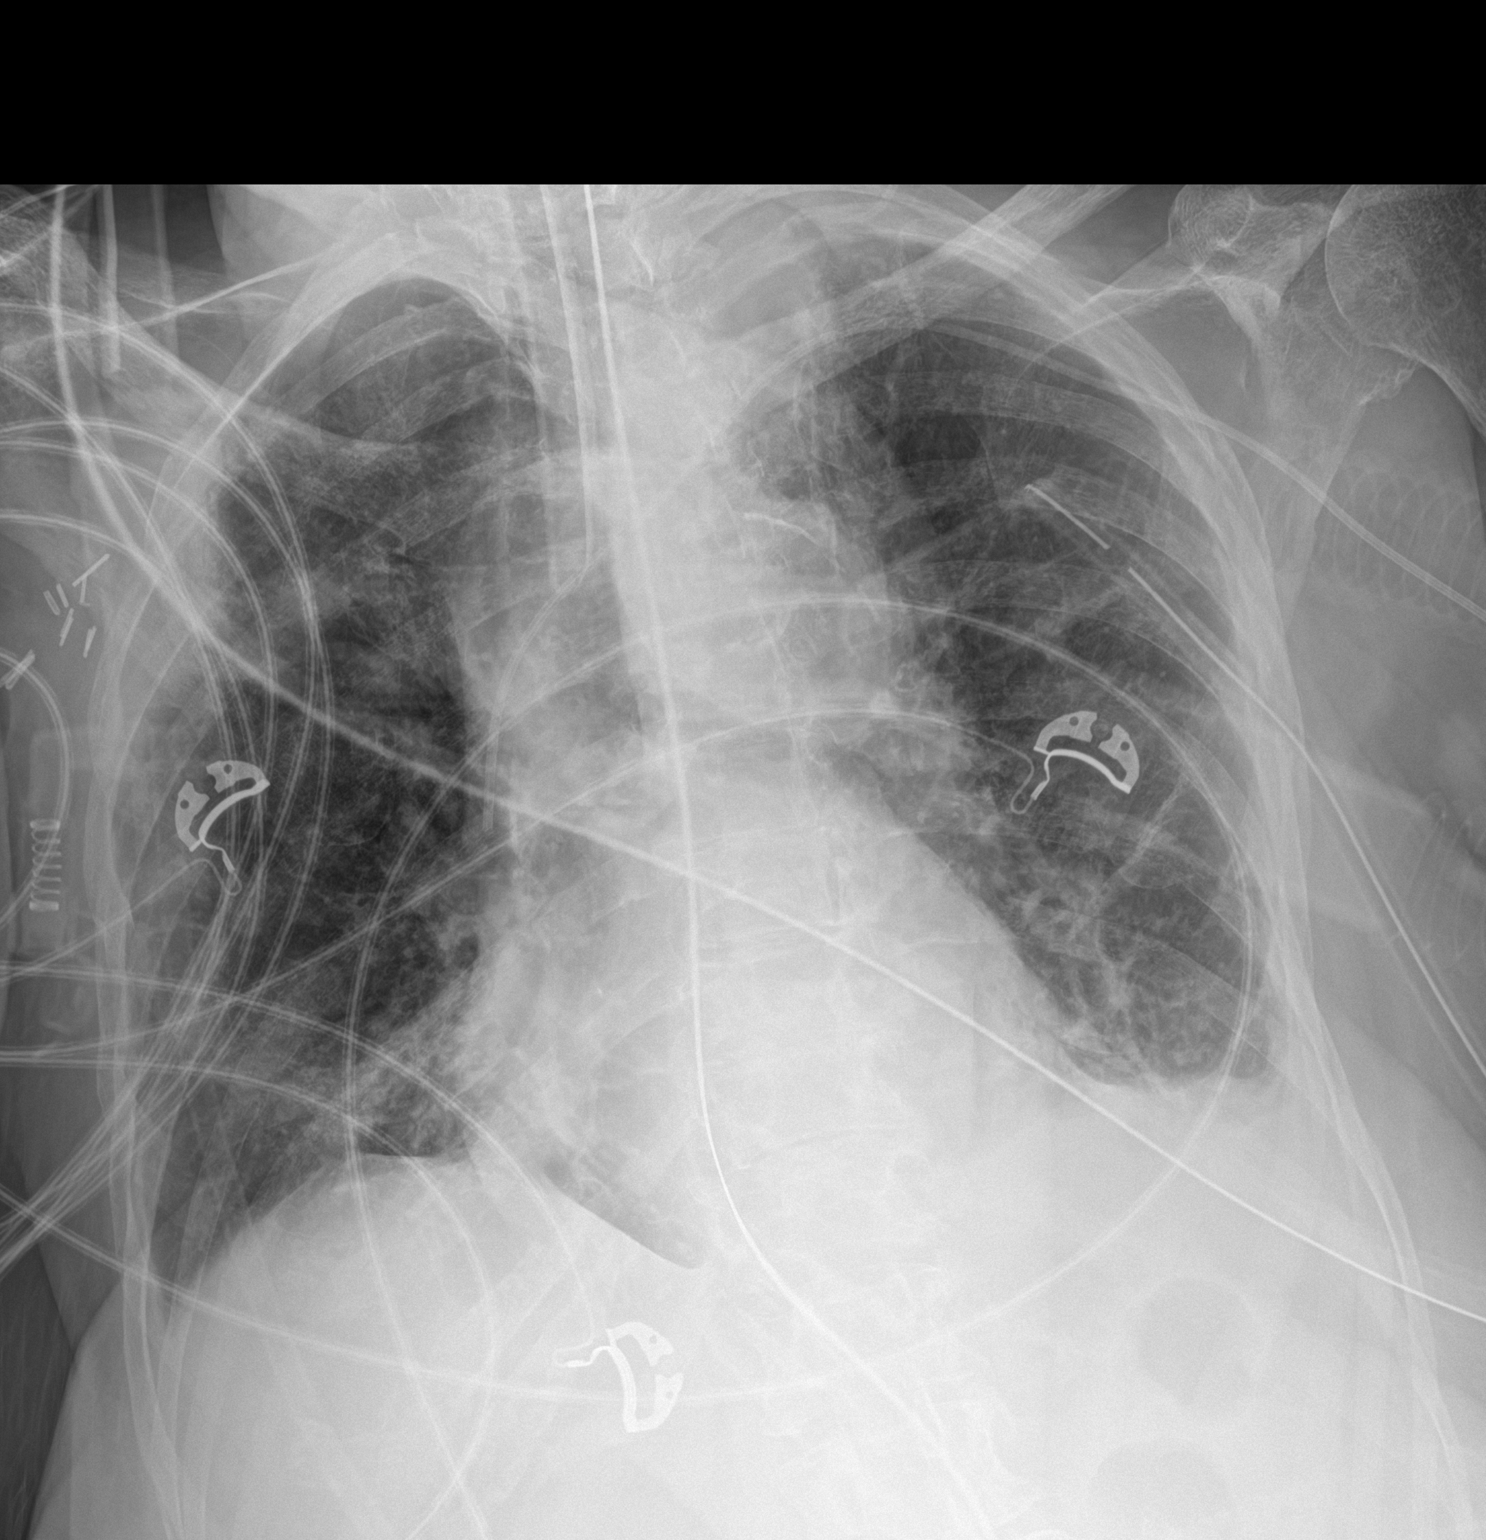

[1 of 1 positions shown; findings below may reference images not displayed]

FINDINGS: Endotracheal tube tip is 2.5 cm above the carina. Nasogastric tube
tip and side port are below the diaphragm. Central catheter tip is
in the superior vena cava. Chest tube again noted on the left. No
pneumothorax.

Small left pleural effusion with left base atelectasis persists.
Opacity in the medial left apex region measures 4.8 x 4.3 cm,
stable, concerning for mass in this area. There is right base
atelectasis. Right lung otherwise clear. Heart size and pulmonary
vascularity are within normal limits. There is aortic
atherosclerosis. No appreciable adenopathy. No bone lesions.
IMPRESSION: Tube and catheter positions as described without pneumothorax.
Suspect mass in the medial left apex. Correlation with chest CT,
ideally with intravenous contrast, to further evaluate this area may
well be warranted. Small left pleural effusion with bibasilar
atelectasis. Stable cardiac silhouette.

Aortic Atherosclerosis ([id number]).
